# Patient Record
Sex: Female | Born: 1955 | Race: Black or African American | Hispanic: No | Marital: Single | Smoking: Former smoker
Health system: Southern US, Community
[De-identification: ages and names within clinical notes are randomized; demographics above are authoritative.]

## PROBLEM LIST (undated history)

## (undated) DIAGNOSIS — J309 Allergic rhinitis, unspecified: Secondary | ICD-10-CM

## (undated) DIAGNOSIS — D649 Anemia, unspecified: Secondary | ICD-10-CM

## (undated) DIAGNOSIS — K8020 Calculus of gallbladder without cholecystitis without obstruction: Secondary | ICD-10-CM

## (undated) DIAGNOSIS — Z9889 Other specified postprocedural states: Secondary | ICD-10-CM

## (undated) DIAGNOSIS — F32A Depression, unspecified: Secondary | ICD-10-CM

## (undated) DIAGNOSIS — H60509 Unspecified acute noninfective otitis externa, unspecified ear: Secondary | ICD-10-CM

## (undated) DIAGNOSIS — R0789 Other chest pain: Secondary | ICD-10-CM

## (undated) DIAGNOSIS — L84 Corns and callosities: Secondary | ICD-10-CM

## (undated) DIAGNOSIS — F1011 Alcohol abuse, in remission: Secondary | ICD-10-CM

## (undated) DIAGNOSIS — K449 Diaphragmatic hernia without obstruction or gangrene: Secondary | ICD-10-CM

## (undated) DIAGNOSIS — C801 Malignant (primary) neoplasm, unspecified: Secondary | ICD-10-CM

## (undated) DIAGNOSIS — K7010 Alcoholic hepatitis without ascites: Secondary | ICD-10-CM

## (undated) DIAGNOSIS — F329 Major depressive disorder, single episode, unspecified: Secondary | ICD-10-CM

## (undated) DIAGNOSIS — R112 Nausea with vomiting, unspecified: Secondary | ICD-10-CM

## (undated) DIAGNOSIS — H6120 Impacted cerumen, unspecified ear: Secondary | ICD-10-CM

## (undated) DIAGNOSIS — N951 Menopausal and female climacteric states: Secondary | ICD-10-CM

## (undated) DIAGNOSIS — H6690 Otitis media, unspecified, unspecified ear: Secondary | ICD-10-CM

## (undated) DIAGNOSIS — K219 Gastro-esophageal reflux disease without esophagitis: Secondary | ICD-10-CM

## (undated) DIAGNOSIS — E785 Hyperlipidemia, unspecified: Secondary | ICD-10-CM

## (undated) DIAGNOSIS — I10 Essential (primary) hypertension: Secondary | ICD-10-CM

## (undated) DIAGNOSIS — Z9289 Personal history of other medical treatment: Secondary | ICD-10-CM

## (undated) HISTORY — DX: Alcoholic hepatitis without ascites: K70.10

## (undated) HISTORY — PX: ABDOMINAL HYSTERECTOMY: SHX81

## (undated) HISTORY — DX: Menopausal and female climacteric states: N95.1

## (undated) HISTORY — DX: Allergic rhinitis, unspecified: J30.9

## (undated) HISTORY — DX: Depression, unspecified: F32.A

## (undated) HISTORY — DX: Gastro-esophageal reflux disease without esophagitis: K21.9

## (undated) HISTORY — DX: Impacted cerumen, unspecified ear: H61.20

## (undated) HISTORY — DX: Corns and callosities: L84

## (undated) HISTORY — DX: Otitis media, unspecified, unspecified ear: H66.90

## (undated) HISTORY — DX: Other chest pain: R07.89

## (undated) HISTORY — DX: Alcohol abuse, in remission: F10.11

## (undated) HISTORY — DX: Anemia, unspecified: D64.9

## (undated) HISTORY — DX: Essential (primary) hypertension: I10

## (undated) HISTORY — DX: Unspecified acute noninfective otitis externa, unspecified ear: H60.509

## (undated) HISTORY — DX: Major depressive disorder, single episode, unspecified: F32.9

## (undated) HISTORY — DX: Hyperlipidemia, unspecified: E78.5

---
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
## 1998-01-04 ENCOUNTER — Emergency Department (HOSPITAL_COMMUNITY): Admission: EM | Admit: 1998-01-04 | Discharge: 1998-01-04 | Payer: Self-pay | Admitting: Emergency Medicine

## 1998-02-23 ENCOUNTER — Encounter: Admission: RE | Admit: 1998-02-23 | Discharge: 1998-02-23 | Payer: Self-pay | Admitting: Hematology and Oncology

## 1998-03-10 ENCOUNTER — Encounter: Admission: RE | Admit: 1998-03-10 | Discharge: 1998-03-10 | Payer: Self-pay | Admitting: Internal Medicine

## 1998-05-23 ENCOUNTER — Ambulatory Visit (HOSPITAL_COMMUNITY): Admission: RE | Admit: 1998-05-23 | Discharge: 1998-05-23 | Payer: Self-pay

## 1998-11-15 ENCOUNTER — Ambulatory Visit (HOSPITAL_COMMUNITY): Admission: RE | Admit: 1998-11-15 | Discharge: 1998-11-15 | Payer: Self-pay | Admitting: Hematology and Oncology

## 1998-11-15 ENCOUNTER — Encounter: Admission: RE | Admit: 1998-11-15 | Discharge: 1998-11-15 | Payer: Self-pay | Admitting: Hematology and Oncology

## 1998-11-15 ENCOUNTER — Encounter: Payer: Self-pay | Admitting: Hematology and Oncology

## 1998-11-22 ENCOUNTER — Ambulatory Visit (HOSPITAL_COMMUNITY): Admission: RE | Admit: 1998-11-22 | Discharge: 1998-11-22 | Payer: Self-pay | Admitting: *Deleted

## 1998-11-23 ENCOUNTER — Encounter: Admission: RE | Admit: 1998-11-23 | Discharge: 1998-11-23 | Payer: Self-pay | Admitting: Hematology and Oncology

## 1999-01-20 ENCOUNTER — Emergency Department (HOSPITAL_COMMUNITY): Admission: EM | Admit: 1999-01-20 | Discharge: 1999-01-20 | Payer: Self-pay | Admitting: Emergency Medicine

## 1999-01-20 ENCOUNTER — Encounter: Payer: Self-pay | Admitting: Emergency Medicine

## 1999-02-09 ENCOUNTER — Encounter: Admission: RE | Admit: 1999-02-09 | Discharge: 1999-02-09 | Payer: Self-pay | Admitting: Internal Medicine

## 1999-10-12 ENCOUNTER — Encounter: Admission: RE | Admit: 1999-10-12 | Discharge: 1999-10-12 | Payer: Self-pay | Admitting: Internal Medicine

## 2000-04-15 ENCOUNTER — Encounter: Admission: RE | Admit: 2000-04-15 | Discharge: 2000-04-15 | Payer: Self-pay | Admitting: Internal Medicine

## 2001-03-12 ENCOUNTER — Encounter: Admission: RE | Admit: 2001-03-12 | Discharge: 2001-03-12 | Payer: Self-pay

## 2001-04-23 ENCOUNTER — Encounter: Admission: RE | Admit: 2001-04-23 | Discharge: 2001-04-23 | Payer: Self-pay

## 2001-12-09 ENCOUNTER — Encounter: Payer: Self-pay | Admitting: Emergency Medicine

## 2001-12-09 ENCOUNTER — Emergency Department (HOSPITAL_COMMUNITY): Admission: EM | Admit: 2001-12-09 | Discharge: 2001-12-09 | Payer: Self-pay | Admitting: Emergency Medicine

## 2001-12-12 ENCOUNTER — Encounter: Admission: RE | Admit: 2001-12-12 | Discharge: 2001-12-12 | Payer: Self-pay | Admitting: Internal Medicine

## 2001-12-22 ENCOUNTER — Encounter: Admission: RE | Admit: 2001-12-22 | Discharge: 2001-12-22 | Payer: Self-pay | Admitting: Internal Medicine

## 2002-03-30 ENCOUNTER — Encounter: Admission: RE | Admit: 2002-03-30 | Discharge: 2002-03-30 | Payer: Self-pay | Admitting: Internal Medicine

## 2002-04-13 ENCOUNTER — Encounter: Payer: Self-pay | Admitting: Emergency Medicine

## 2002-04-13 ENCOUNTER — Emergency Department (HOSPITAL_COMMUNITY): Admission: EM | Admit: 2002-04-13 | Discharge: 2002-04-13 | Payer: Self-pay | Admitting: Emergency Medicine

## 2002-06-18 ENCOUNTER — Encounter: Admission: RE | Admit: 2002-06-18 | Discharge: 2002-06-18 | Payer: Self-pay | Admitting: Internal Medicine

## 2002-09-10 ENCOUNTER — Encounter: Admission: RE | Admit: 2002-09-10 | Discharge: 2002-09-10 | Payer: Self-pay | Admitting: Infectious Diseases

## 2002-09-11 ENCOUNTER — Ambulatory Visit (HOSPITAL_COMMUNITY): Admission: RE | Admit: 2002-09-11 | Discharge: 2002-09-11 | Payer: Self-pay

## 2003-04-01 ENCOUNTER — Encounter: Admission: RE | Admit: 2003-04-01 | Discharge: 2003-04-01 | Payer: Self-pay | Admitting: Internal Medicine

## 2003-08-11 ENCOUNTER — Encounter: Admission: RE | Admit: 2003-08-11 | Discharge: 2003-08-11 | Payer: Self-pay | Admitting: Internal Medicine

## 2003-08-11 ENCOUNTER — Inpatient Hospital Stay (HOSPITAL_COMMUNITY): Admission: EM | Admit: 2003-08-11 | Discharge: 2003-08-12 | Payer: Self-pay | Admitting: Emergency Medicine

## 2003-08-18 ENCOUNTER — Encounter: Admission: RE | Admit: 2003-08-18 | Discharge: 2003-08-18 | Payer: Self-pay | Admitting: Internal Medicine

## 2003-10-27 ENCOUNTER — Encounter: Admission: RE | Admit: 2003-10-27 | Discharge: 2003-10-27 | Payer: Self-pay | Admitting: Internal Medicine

## 2003-11-02 ENCOUNTER — Encounter: Admission: RE | Admit: 2003-11-02 | Discharge: 2003-11-02 | Payer: Self-pay | Admitting: Internal Medicine

## 2004-04-26 ENCOUNTER — Ambulatory Visit: Payer: Self-pay | Admitting: Internal Medicine

## 2004-06-16 ENCOUNTER — Emergency Department (HOSPITAL_COMMUNITY): Admission: EM | Admit: 2004-06-16 | Discharge: 2004-06-16 | Payer: Self-pay | Admitting: Emergency Medicine

## 2005-04-06 ENCOUNTER — Ambulatory Visit: Payer: Self-pay | Admitting: Internal Medicine

## 2005-04-24 ENCOUNTER — Emergency Department (HOSPITAL_COMMUNITY): Admission: EM | Admit: 2005-04-24 | Discharge: 2005-04-24 | Payer: Self-pay | Admitting: Emergency Medicine

## 2005-09-19 ENCOUNTER — Ambulatory Visit: Payer: Self-pay | Admitting: Internal Medicine

## 2005-10-01 ENCOUNTER — Ambulatory Visit (HOSPITAL_COMMUNITY): Admission: RE | Admit: 2005-10-01 | Discharge: 2005-10-01 | Payer: Self-pay | Admitting: Internal Medicine

## 2005-11-22 ENCOUNTER — Ambulatory Visit (HOSPITAL_COMMUNITY): Admission: RE | Admit: 2005-11-22 | Discharge: 2005-11-22 | Payer: Self-pay | Admitting: Internal Medicine

## 2005-11-22 ENCOUNTER — Ambulatory Visit: Payer: Self-pay | Admitting: Internal Medicine

## 2006-03-08 ENCOUNTER — Encounter (INDEPENDENT_AMBULATORY_CARE_PROVIDER_SITE_OTHER): Payer: Self-pay | Admitting: Internal Medicine

## 2006-03-08 ENCOUNTER — Ambulatory Visit: Payer: Self-pay | Admitting: Internal Medicine

## 2006-03-08 LAB — CONVERTED CEMR LAB
B-12, serum: 625 pg/mL (ref 211–911)
Basophils percent auto: 1 % (ref 0–1)
Glucose, Bld: 99 mg/dL (ref 70–99)
Hemoglobin: 12.3 g/dL (ref 12.0–15.0)
Leukocyte count, blood: 5.1 10*9/L (ref 4.0–10.5)
Lymphocytes Relative: 33 % (ref 12–46)
Lymphs Abs: 1.7 10*3/uL (ref 0.7–3.3)
MCHC: 34.3 g/dL (ref 30.0–36.0)
Monocytes Absolute: 0.4 10*3/uL (ref 0.2–0.7)
Monocytes Relative: 8 % (ref 3–11)
Neutro Abs: 3 10*3/uL (ref 1.7–7.7)
Potassium: 4.3 meq/L (ref 3.5–5.3)
RBC: 3.56 M/uL — ABNORMAL LOW (ref 3.87–5.11)
Sodium: 142 meq/L (ref 135–145)

## 2006-03-11 ENCOUNTER — Ambulatory Visit (HOSPITAL_COMMUNITY): Admission: RE | Admit: 2006-03-11 | Discharge: 2006-03-11 | Payer: Self-pay | Admitting: Internal Medicine

## 2006-03-22 ENCOUNTER — Ambulatory Visit: Payer: Self-pay | Admitting: Internal Medicine

## 2006-04-17 ENCOUNTER — Ambulatory Visit: Payer: Self-pay | Admitting: Internal Medicine

## 2006-04-17 ENCOUNTER — Encounter (INDEPENDENT_AMBULATORY_CARE_PROVIDER_SITE_OTHER): Payer: Self-pay | Admitting: Internal Medicine

## 2006-04-29 ENCOUNTER — Ambulatory Visit: Payer: Self-pay | Admitting: Gastroenterology

## 2006-05-01 ENCOUNTER — Ambulatory Visit: Payer: Self-pay | Admitting: Cardiology

## 2006-05-20 ENCOUNTER — Ambulatory Visit: Payer: Self-pay | Admitting: Internal Medicine

## 2006-05-29 ENCOUNTER — Ambulatory Visit: Payer: Self-pay | Admitting: Gastroenterology

## 2006-06-03 ENCOUNTER — Ambulatory Visit: Payer: Self-pay | Admitting: Gastroenterology

## 2006-06-03 ENCOUNTER — Encounter (INDEPENDENT_AMBULATORY_CARE_PROVIDER_SITE_OTHER): Payer: Self-pay | Admitting: Internal Medicine

## 2006-06-03 ENCOUNTER — Encounter (INDEPENDENT_AMBULATORY_CARE_PROVIDER_SITE_OTHER): Payer: Self-pay | Admitting: Specialist

## 2006-07-09 ENCOUNTER — Telehealth: Payer: Self-pay | Admitting: *Deleted

## 2006-08-28 ENCOUNTER — Telehealth: Payer: Self-pay | Admitting: *Deleted

## 2006-09-02 ENCOUNTER — Telehealth (INDEPENDENT_AMBULATORY_CARE_PROVIDER_SITE_OTHER): Payer: Self-pay | Admitting: *Deleted

## 2006-09-24 ENCOUNTER — Ambulatory Visit: Payer: Self-pay | Admitting: Internal Medicine

## 2006-09-24 ENCOUNTER — Encounter (INDEPENDENT_AMBULATORY_CARE_PROVIDER_SITE_OTHER): Payer: Self-pay | Admitting: Internal Medicine

## 2006-09-24 DIAGNOSIS — N951 Menopausal and female climacteric states: Secondary | ICD-10-CM

## 2006-09-24 DIAGNOSIS — J45909 Unspecified asthma, uncomplicated: Secondary | ICD-10-CM

## 2006-09-24 DIAGNOSIS — F329 Major depressive disorder, single episode, unspecified: Secondary | ICD-10-CM | POA: Insufficient documentation

## 2006-09-24 DIAGNOSIS — Z87891 Personal history of nicotine dependence: Secondary | ICD-10-CM | POA: Insufficient documentation

## 2006-09-24 DIAGNOSIS — F1011 Alcohol abuse, in remission: Secondary | ICD-10-CM | POA: Insufficient documentation

## 2006-09-24 DIAGNOSIS — D539 Nutritional anemia, unspecified: Secondary | ICD-10-CM | POA: Insufficient documentation

## 2006-09-24 DIAGNOSIS — I10 Essential (primary) hypertension: Secondary | ICD-10-CM | POA: Insufficient documentation

## 2006-09-24 DIAGNOSIS — K219 Gastro-esophageal reflux disease without esophagitis: Secondary | ICD-10-CM

## 2006-09-24 DIAGNOSIS — R61 Generalized hyperhidrosis: Secondary | ICD-10-CM | POA: Insufficient documentation

## 2006-09-24 DIAGNOSIS — T7840XA Allergy, unspecified, initial encounter: Secondary | ICD-10-CM

## 2006-09-24 DIAGNOSIS — Z8719 Personal history of other diseases of the digestive system: Secondary | ICD-10-CM

## 2006-09-24 HISTORY — DX: Menopausal and female climacteric states: N95.1

## 2006-09-24 LAB — CONVERTED CEMR LAB
CO2: 26 meq/L (ref 19–32)
Calcium: 9.6 mg/dL (ref 8.4–10.5)
Chloride: 99 meq/L (ref 96–112)
Creatinine, Ser: 0.7 mg/dL (ref 0.40–1.20)
Glucose, Bld: 84 mg/dL (ref 70–99)
HCT: 39.5 % (ref 36.0–46.0)
Hemoglobin: 12.9 g/dL (ref 12.0–15.0)
MCV: 92.3 fL (ref 78.0–100.0)
RBC: 4.28 M/uL (ref 3.87–5.11)
Total Bilirubin: 0.4 mg/dL (ref 0.3–1.2)
WBC: 5.9 10*3/uL (ref 4.0–10.5)

## 2006-09-25 ENCOUNTER — Telehealth (INDEPENDENT_AMBULATORY_CARE_PROVIDER_SITE_OTHER): Payer: Self-pay | Admitting: Pharmacy Technician

## 2006-10-01 ENCOUNTER — Telehealth (INDEPENDENT_AMBULATORY_CARE_PROVIDER_SITE_OTHER): Payer: Self-pay | Admitting: Pharmacy Technician

## 2006-12-26 ENCOUNTER — Ambulatory Visit (HOSPITAL_COMMUNITY): Admission: RE | Admit: 2006-12-26 | Discharge: 2006-12-26 | Payer: Self-pay | Admitting: Obstetrics and Gynecology

## 2007-01-30 ENCOUNTER — Telehealth: Payer: Self-pay | Admitting: *Deleted

## 2007-02-13 ENCOUNTER — Telehealth: Payer: Self-pay | Admitting: *Deleted

## 2007-02-27 ENCOUNTER — Telehealth (INDEPENDENT_AMBULATORY_CARE_PROVIDER_SITE_OTHER): Payer: Self-pay | Admitting: Hospitalist

## 2007-04-07 ENCOUNTER — Ambulatory Visit: Payer: Self-pay | Admitting: Hospitalist

## 2007-04-16 ENCOUNTER — Telehealth (INDEPENDENT_AMBULATORY_CARE_PROVIDER_SITE_OTHER): Payer: Self-pay | Admitting: Internal Medicine

## 2007-04-17 ENCOUNTER — Ambulatory Visit: Payer: Self-pay | Admitting: *Deleted

## 2007-04-21 ENCOUNTER — Telehealth (INDEPENDENT_AMBULATORY_CARE_PROVIDER_SITE_OTHER): Payer: Self-pay | Admitting: Internal Medicine

## 2007-04-29 ENCOUNTER — Telehealth: Payer: Self-pay | Admitting: *Deleted

## 2007-05-28 ENCOUNTER — Telehealth (INDEPENDENT_AMBULATORY_CARE_PROVIDER_SITE_OTHER): Payer: Self-pay | Admitting: Internal Medicine

## 2007-05-29 DIAGNOSIS — L84 Corns and callosities: Secondary | ICD-10-CM

## 2007-05-29 HISTORY — DX: Corns and callosities: L84

## 2007-06-11 ENCOUNTER — Telehealth (INDEPENDENT_AMBULATORY_CARE_PROVIDER_SITE_OTHER): Payer: Self-pay | Admitting: Pharmacy Technician

## 2007-06-12 ENCOUNTER — Ambulatory Visit: Payer: Self-pay | Admitting: Internal Medicine

## 2007-06-23 ENCOUNTER — Telehealth (INDEPENDENT_AMBULATORY_CARE_PROVIDER_SITE_OTHER): Payer: Self-pay | Admitting: Pharmacy Technician

## 2007-08-18 ENCOUNTER — Ambulatory Visit: Payer: Self-pay | Admitting: *Deleted

## 2007-08-18 ENCOUNTER — Encounter (INDEPENDENT_AMBULATORY_CARE_PROVIDER_SITE_OTHER): Payer: Self-pay | Admitting: Internal Medicine

## 2007-08-18 DIAGNOSIS — L84 Corns and callosities: Secondary | ICD-10-CM | POA: Insufficient documentation

## 2007-08-18 LAB — CONVERTED CEMR LAB
CO2: 28 meq/L (ref 19–32)
Glucose, Bld: 93 mg/dL (ref 70–99)
Potassium: 3.9 meq/L (ref 3.5–5.3)
Sodium: 140 meq/L (ref 135–145)

## 2007-08-19 ENCOUNTER — Telehealth (INDEPENDENT_AMBULATORY_CARE_PROVIDER_SITE_OTHER): Payer: Self-pay | Admitting: Internal Medicine

## 2007-09-02 ENCOUNTER — Encounter (INDEPENDENT_AMBULATORY_CARE_PROVIDER_SITE_OTHER): Payer: Self-pay | Admitting: Internal Medicine

## 2007-09-02 ENCOUNTER — Ambulatory Visit: Payer: Self-pay | Admitting: Internal Medicine

## 2007-11-04 ENCOUNTER — Ambulatory Visit: Payer: Self-pay | Admitting: *Deleted

## 2007-11-04 ENCOUNTER — Encounter (INDEPENDENT_AMBULATORY_CARE_PROVIDER_SITE_OTHER): Payer: Self-pay | Admitting: Internal Medicine

## 2007-11-04 DIAGNOSIS — K5900 Constipation, unspecified: Secondary | ICD-10-CM

## 2007-11-05 LAB — CONVERTED CEMR LAB
Alkaline Phosphatase: 55 units/L (ref 39–117)
CO2: 25 meq/L (ref 19–32)
Cholesterol: 272 mg/dL — ABNORMAL HIGH (ref 0–200)
Creatinine, Ser: 0.69 mg/dL (ref 0.40–1.20)
Eosinophils Absolute: 0.2 10*3/uL (ref 0.0–0.7)
Eosinophils Relative: 4 % (ref 0–5)
Glucose, Bld: 70 mg/dL (ref 70–99)
HCT: 41.9 % (ref 36.0–46.0)
HDL: 112 mg/dL (ref >39)
Hemoglobin: 13.5 g/dL (ref 12.0–15.0)
LDL Cholesterol: 148 mg/dL — ABNORMAL HIGH (ref 0–99)
Lymphocytes Relative: 36 % (ref 12–46)
Lymphs Abs: 2 10*3/uL (ref 0.7–4.0)
MCV: 95 fL (ref 78.0–100.0)
Monocytes Absolute: 0.4 10*3/uL (ref 0.1–1.0)
Platelets: 293 10*3/uL (ref 150–400)
RDW: 12.8 % (ref 11.5–15.5)
Sodium: 138 meq/L (ref 135–145)
Total Bilirubin: 1 mg/dL (ref 0.3–1.2)
Total CHOL/HDL Ratio: 2.4
Total Protein: 7.4 g/dL (ref 6.0–8.3)
Triglycerides: 58 mg/dL (ref <150)
VLDL: 12 mg/dL (ref 0–40)
WBC: 5.6 10*3/uL (ref 4.0–10.5)

## 2007-11-25 ENCOUNTER — Telehealth (INDEPENDENT_AMBULATORY_CARE_PROVIDER_SITE_OTHER): Payer: Self-pay | Admitting: Internal Medicine

## 2007-12-11 ENCOUNTER — Telehealth (INDEPENDENT_AMBULATORY_CARE_PROVIDER_SITE_OTHER): Payer: Self-pay | Admitting: Internal Medicine

## 2007-12-29 ENCOUNTER — Ambulatory Visit (HOSPITAL_COMMUNITY): Admission: RE | Admit: 2007-12-29 | Discharge: 2007-12-29 | Payer: Self-pay | Admitting: Internal Medicine

## 2008-01-22 ENCOUNTER — Telehealth (INDEPENDENT_AMBULATORY_CARE_PROVIDER_SITE_OTHER): Payer: Self-pay | Admitting: Internal Medicine

## 2008-01-27 ENCOUNTER — Telehealth (INDEPENDENT_AMBULATORY_CARE_PROVIDER_SITE_OTHER): Payer: Self-pay | Admitting: Pharmacy Technician

## 2008-02-16 ENCOUNTER — Telehealth (INDEPENDENT_AMBULATORY_CARE_PROVIDER_SITE_OTHER): Payer: Self-pay | Admitting: Internal Medicine

## 2008-03-09 ENCOUNTER — Telehealth (INDEPENDENT_AMBULATORY_CARE_PROVIDER_SITE_OTHER): Payer: Self-pay | Admitting: Internal Medicine

## 2008-03-22 ENCOUNTER — Ambulatory Visit: Payer: Self-pay | Admitting: Internal Medicine

## 2008-03-23 ENCOUNTER — Ambulatory Visit: Payer: Self-pay | Admitting: Internal Medicine

## 2008-03-25 DIAGNOSIS — E785 Hyperlipidemia, unspecified: Secondary | ICD-10-CM | POA: Insufficient documentation

## 2008-03-25 LAB — CONVERTED CEMR LAB
LDL Cholesterol: 135 mg/dL — ABNORMAL HIGH (ref 0–99)
VLDL: 15 mg/dL (ref 0–40)

## 2008-04-05 ENCOUNTER — Telehealth (INDEPENDENT_AMBULATORY_CARE_PROVIDER_SITE_OTHER): Payer: Self-pay | Admitting: Internal Medicine

## 2008-04-20 ENCOUNTER — Telehealth: Payer: Self-pay | Admitting: Internal Medicine

## 2008-05-10 ENCOUNTER — Telehealth (INDEPENDENT_AMBULATORY_CARE_PROVIDER_SITE_OTHER): Payer: Self-pay | Admitting: Internal Medicine

## 2008-06-01 ENCOUNTER — Telehealth (INDEPENDENT_AMBULATORY_CARE_PROVIDER_SITE_OTHER): Payer: Self-pay | Admitting: Pharmacy Technician

## 2008-09-22 ENCOUNTER — Telehealth (INDEPENDENT_AMBULATORY_CARE_PROVIDER_SITE_OTHER): Payer: Self-pay | Admitting: Internal Medicine

## 2008-10-01 ENCOUNTER — Ambulatory Visit: Payer: Self-pay | Admitting: Infectious Disease

## 2008-10-01 ENCOUNTER — Encounter (INDEPENDENT_AMBULATORY_CARE_PROVIDER_SITE_OTHER): Payer: Self-pay | Admitting: Internal Medicine

## 2008-10-01 DIAGNOSIS — R1011 Right upper quadrant pain: Secondary | ICD-10-CM

## 2008-10-01 LAB — CONVERTED CEMR LAB
ALT: 13 units/L (ref 0–35)
AST: 21 units/L (ref 0–37)
Basophils Absolute: 0 10*3/uL (ref 0.0–0.1)
Basophils Relative: 0 % (ref 0–1)
CO2: 28 meq/L (ref 19–32)
Calcium: 9.6 mg/dL (ref 8.4–10.5)
Chloride: 103 meq/L (ref 96–112)
Creatinine, Ser: 0.72 mg/dL (ref 0.40–1.20)
Eosinophils Absolute: 0.1 10*3/uL (ref 0.0–0.7)
GFR calc Af Amer: >60 mL/min (ref >60)
MCHC: 33.7 g/dL (ref 30.0–36.0)
MCV: 95 fL (ref 78.0–100.0)
Monocytes Absolute: 0.4 10*3/uL (ref 0.1–1.0)
Monocytes Relative: 7 % (ref 3–12)
Neutrophils Relative %: 60 % (ref 43–77)
Potassium: 3.6 meq/L (ref 3.5–5.3)
RBC: 4.07 M/uL (ref 3.87–5.11)
RDW: 12.9 % (ref 11.5–15.5)
Sodium: 140 meq/L (ref 135–145)
Total Protein: 6.9 g/dL (ref 6.0–8.3)

## 2008-10-06 ENCOUNTER — Ambulatory Visit: Payer: Self-pay | Admitting: Infectious Disease

## 2008-10-06 ENCOUNTER — Encounter: Payer: Self-pay | Admitting: Internal Medicine

## 2008-10-06 LAB — CONVERTED CEMR LAB
BUN: 8 mg/dL (ref 6–23)
CO2: 30 meq/L (ref 19–32)
Calcium: 9.2 mg/dL (ref 8.4–10.5)
Chloride: 101 meq/L (ref 96–112)
Creatinine, Ser: 0.66 mg/dL (ref 0.40–1.20)
GFR calc Af Amer: >60 mL/min (ref >60)
Total Bilirubin: 0.4 mg/dL (ref 0.3–1.2)

## 2008-10-12 ENCOUNTER — Ambulatory Visit (HOSPITAL_COMMUNITY): Admission: RE | Admit: 2008-10-12 | Discharge: 2008-10-12 | Payer: Self-pay | Admitting: Internal Medicine

## 2008-10-13 DIAGNOSIS — K8020 Calculus of gallbladder without cholecystitis without obstruction: Secondary | ICD-10-CM | POA: Insufficient documentation

## 2008-10-14 ENCOUNTER — Telehealth (INDEPENDENT_AMBULATORY_CARE_PROVIDER_SITE_OTHER): Payer: Self-pay | Admitting: *Deleted

## 2008-10-14 ENCOUNTER — Encounter (INDEPENDENT_AMBULATORY_CARE_PROVIDER_SITE_OTHER): Payer: Self-pay | Admitting: *Deleted

## 2008-11-01 ENCOUNTER — Telehealth (INDEPENDENT_AMBULATORY_CARE_PROVIDER_SITE_OTHER): Payer: Self-pay | Admitting: Internal Medicine

## 2008-11-04 ENCOUNTER — Encounter (INDEPENDENT_AMBULATORY_CARE_PROVIDER_SITE_OTHER): Payer: Self-pay | Admitting: Internal Medicine

## 2008-11-04 ENCOUNTER — Ambulatory Visit: Payer: Self-pay | Admitting: Internal Medicine

## 2008-11-04 LAB — CONVERTED CEMR LAB
ALT: 9 units/L (ref 0–35)
AST: 16 units/L (ref 0–37)
Albumin: 4.2 g/dL (ref 3.5–5.2)
Alkaline Phosphatase: 51 units/L (ref 39–117)
Basophils Absolute: 0 10*3/uL (ref 0.0–0.1)
Basophils Relative: 1 % (ref 0–1)
Eosinophils Absolute: 0.1 10*3/uL (ref 0.0–0.7)
Eosinophils Relative: 1 % (ref 0–5)
HCT: 39.4 % (ref 36.0–46.0)
Lymphs Abs: 1.7 10*3/uL (ref 0.7–4.0)
MCHC: 33.4 g/dL (ref 30.0–36.0)
MCV: 95 fL (ref 78.0–100.0)
Platelets: 310 10*3/uL (ref 150–400)
Potassium: 3.9 meq/L (ref 3.5–5.3)
RDW: 12.6 % (ref 11.5–15.5)
Sodium: 141 meq/L (ref 135–145)
Total Protein: 6.9 g/dL (ref 6.0–8.3)

## 2008-12-20 ENCOUNTER — Encounter: Payer: Self-pay | Admitting: Internal Medicine

## 2008-12-28 ENCOUNTER — Telehealth: Payer: Self-pay | Admitting: Internal Medicine

## 2008-12-29 ENCOUNTER — Ambulatory Visit (HOSPITAL_COMMUNITY): Admission: RE | Admit: 2008-12-29 | Discharge: 2008-12-29 | Payer: Self-pay | Admitting: Internal Medicine

## 2009-01-06 HISTORY — PX: CHOLECYSTECTOMY: SHX55

## 2009-02-24 ENCOUNTER — Ambulatory Visit: Payer: Self-pay | Admitting: Internal Medicine

## 2009-02-24 LAB — CONVERTED CEMR LAB
LDL Cholesterol: 116 mg/dL — ABNORMAL HIGH (ref 0–99)
Triglycerides: 78 mg/dL (ref <150)
VLDL: 16 mg/dL (ref 0–40)

## 2009-03-14 ENCOUNTER — Telehealth: Payer: Self-pay | Admitting: Internal Medicine

## 2009-03-15 ENCOUNTER — Telehealth: Payer: Self-pay | Admitting: *Deleted

## 2009-03-22 ENCOUNTER — Telehealth: Payer: Self-pay | Admitting: Internal Medicine

## 2009-04-29 ENCOUNTER — Telehealth: Payer: Self-pay | Admitting: Internal Medicine

## 2009-05-09 ENCOUNTER — Telehealth: Payer: Self-pay | Admitting: Internal Medicine

## 2009-05-19 ENCOUNTER — Telehealth: Payer: Self-pay | Admitting: Internal Medicine

## 2009-05-28 DIAGNOSIS — R0789 Other chest pain: Secondary | ICD-10-CM

## 2009-05-28 HISTORY — DX: Other chest pain: R07.89

## 2009-06-06 ENCOUNTER — Telehealth: Payer: Self-pay | Admitting: Internal Medicine

## 2009-06-22 ENCOUNTER — Telehealth: Payer: Self-pay | Admitting: Internal Medicine

## 2009-07-13 ENCOUNTER — Ambulatory Visit: Payer: Self-pay | Admitting: Internal Medicine

## 2009-07-13 DIAGNOSIS — H60339 Swimmer's ear, unspecified ear: Secondary | ICD-10-CM

## 2009-07-13 LAB — CONVERTED CEMR LAB
ALT: 9 units/L (ref 0–35)
AST: 20 units/L (ref 0–37)
Calcium: 9.5 mg/dL (ref 8.4–10.5)
Chloride: 101 meq/L (ref 96–112)
Creatinine, Ser: 0.78 mg/dL (ref 0.40–1.20)
Total Bilirubin: 0.4 mg/dL (ref 0.3–1.2)
Total CHOL/HDL Ratio: 2.3
VLDL: 19 mg/dL (ref 0–40)

## 2009-07-27 ENCOUNTER — Telehealth: Payer: Self-pay | Admitting: *Deleted

## 2009-07-27 ENCOUNTER — Ambulatory Visit: Payer: Self-pay | Admitting: Infectious Diseases

## 2009-07-27 DIAGNOSIS — R634 Abnormal weight loss: Secondary | ICD-10-CM | POA: Insufficient documentation

## 2009-07-27 DIAGNOSIS — H66009 Acute suppurative otitis media without spontaneous rupture of ear drum, unspecified ear: Secondary | ICD-10-CM | POA: Insufficient documentation

## 2009-07-27 LAB — CONVERTED CEMR LAB
Lymphocytes Relative: 31 % (ref 12–46)
Lymphs Abs: 1.8 10*3/uL (ref 0.7–4.0)
Monocytes Relative: 6 % (ref 3–12)
Neutro Abs: 3.6 10*3/uL (ref 1.7–7.7)
Neutrophils Relative %: 61 % (ref 43–77)
RBC: 3.9 M/uL (ref 3.87–5.11)
TSH: 1.221 microintl units/mL (ref 0.350–4.5)
WBC: 6 10*3/uL (ref 4.0–10.5)

## 2009-08-08 ENCOUNTER — Telehealth (INDEPENDENT_AMBULATORY_CARE_PROVIDER_SITE_OTHER): Payer: Self-pay | Admitting: *Deleted

## 2009-08-10 ENCOUNTER — Telehealth: Payer: Self-pay | Admitting: Internal Medicine

## 2009-08-16 ENCOUNTER — Telehealth: Payer: Self-pay | Admitting: Internal Medicine

## 2009-10-06 ENCOUNTER — Emergency Department (HOSPITAL_COMMUNITY): Admission: EM | Admit: 2009-10-06 | Discharge: 2009-10-06 | Payer: Self-pay | Admitting: Family Medicine

## 2009-11-10 ENCOUNTER — Telehealth: Payer: Self-pay | Admitting: Internal Medicine

## 2009-12-23 ENCOUNTER — Ambulatory Visit: Payer: Self-pay | Admitting: Internal Medicine

## 2009-12-23 DIAGNOSIS — N898 Other specified noninflammatory disorders of vagina: Secondary | ICD-10-CM | POA: Insufficient documentation

## 2009-12-26 LAB — CONVERTED CEMR LAB
Chloride: 102 meq/L (ref 96–112)
HDL: 91 mg/dL (ref >39)
LDL Cholesterol: 96 mg/dL (ref 0–99)
Potassium: 4.1 meq/L (ref 3.5–5.3)
Triglycerides: 69 mg/dL (ref <150)
VLDL: 14 mg/dL (ref 0–40)

## 2010-01-11 ENCOUNTER — Ambulatory Visit (HOSPITAL_COMMUNITY): Admission: RE | Admit: 2010-01-11 | Discharge: 2010-01-11 | Payer: Self-pay | Admitting: Internal Medicine

## 2010-01-23 ENCOUNTER — Telehealth: Payer: Self-pay | Admitting: Internal Medicine

## 2010-03-17 ENCOUNTER — Ambulatory Visit: Payer: Self-pay | Admitting: Internal Medicine

## 2010-06-14 ENCOUNTER — Telehealth: Payer: Self-pay | Admitting: *Deleted

## 2010-06-27 NOTE — Progress Notes (Signed)
Summary: Refill/gh  Phone Note Refill Request Message from:  Fax from Pharmacy on January 23, 2010 10:14 AM  Refills Requested: Medication #1:  ALBUTEROL 90 MCG/ACT AERS Take 2 puffs every 4 hours as needed   Dosage confirmed as above?Dosage Confirmed Pt only has #3 on current prescription MAPP received #4 through pt assisitance.  Needsorders to coverr the inhaler.   Method Requested: Fax to Local Pharmacy Initial call taken by: Angelina Ok RN,  January 23, 2010 10:16 AM  Follow-up for Phone Call        Rx completed in Dr. Tiajuana Amass Follow-up by: Jackson Latino MD,  January 27, 2010 7:05 AM    Prescriptions: ALBUTEROL 90 MCG/ACT AERS (ALBUTEROL) Take 2 puffs every 4 hours as needed  #1 x 6   Entered and Authorized by:   Jackson Latino MD   Signed by:   Jackson Latino MD on 01/27/2010   Method used:   Faxed to ...       [facility] (retail)       [address] [place], [place]  [address]       Ph: [phone]       Fax: [phone]   RxID:   [id number]

## 2010-06-27 NOTE — Assessment & Plan Note (Signed)
Summary: EAR PAIN/YANG/VS   Vital Signs:  Patient profile:   55 year old female Height:      64 inches (162.56 cm) Weight:      97.0 pounds (44.09 kg) BMI:     16.71 Temp:     98.0 degrees F (36.67 degrees C) oral Pulse rate:   79 / minute BP sitting:   119 / 75  (right arm)  Vitals Entered By: Stanton Kidney Ditzler RN (July 13, 2009 8:53 AM) Is Patient Diabetic? No Pain Assessment Patient in pain? yes     Location: both ears Intensity: 10 Type: pain Onset of pain  past month off and on Nutritional Status BMI of < 19 = underweight Nutritional Status Detail appetite good  Have you ever been in a relationship where you felt threatened, hurt or afraid?denies   Does patient need assistance? Functional Status Self care Ambulation Normal Comments Ck ears.   Primary Care Provider:  Jackson Latino MD   History of Present Illness: 55 y/o female with pmh as desribed on the EMR; who comes to the clinic complaining of bilateral ear pain and discomfort for the last 4-6 weeks; patient denies increased nasal congestion, HA's, fever, chills, or any other complaints. Patient denies hearing decreased and also ear drainage; she reports that Right ear is bothering her more than Left. Patient has had simillar presentation in the past (previous hx of otitis mediaand otitis externa) and had also a remote hx of proccedures inside her ears multiple years agoby a ENT Doctor.  She reports to be compliant with her medications and is otherwise feeling well.    Depression History:      The patient denies a depressed mood most of the day and a diminished interest in her usual daily activities.        The patient denies that she feels like life is not worth living, denies that she wishes that she were dead, and denies that she has thought about ending her life.         Preventive Screening-Counseling & Management  Alcohol-Tobacco     Alcohol drinks/day: 0     Smoking Status: quit     Year Quit:  12/23/1995  Caffeine-Diet-Exercise     Does Patient Exercise: no  Problems Prior to Update: 1)  Otitis Externa, Acute, Bilateral  (ICD-380.12) 2)  Gallstones  (ICD-574.20) 3)  Ruq Pain  (ICD-789.01) 4)  Hyperlipidemia, Borderline  (ICD-272.4) 5)  Constipation  (ICD-564.00) 6)  Callus, Toe  (ICD-700) 7)  Depression  (ICD-311) 8)  Tobacco Abuse, Hx of  (ICD-V15.82) 9)  Asthma  (ICD-493.90) 10)  Allergic Rhinitis  (ICD-477.9) 11)  Menopausal Syndrome  (ICD-627.2) 12)  Night Sweats  (ICD-780.8) 13)  Alcoholic Hepatitis, Hx of  (ICD-V12.79) 14)  Gerd  (ICD-530.81) 15)  Hypertension, Essential Nos  (ICD-401.9) 16)  Abuse, Alcohol, 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) 17)  Anemia, Macrocytic  (ICD-281.9)  Current Problems (verified): 1)  Gallstones  (ICD-574.20) 2)  Ruq Pain  (ICD-789.01) 3)  Hyperlipidemia, Borderline  (ICD-272.4) 4)  Constipation  (ICD-564.00) 5)  Callus, Toe  (ICD-700) 6)  Depression  (ICD-311) 7)  Tobacco Abuse, Hx of  (ICD-V15.82) 8)  Asthma  (ICD-493.90) 9)  Allergic Rhinitis  (ICD-477.9) 10)  Menopausal Syndrome  (ICD-627.2) 11)  Night Sweats  (ICD-780.8) 12)  Alcoholic Hepatitis, Hx of  (ICD-V12.79) 13)  Gerd  (ICD-530.81) 14)  Hypertension, Essential Nos  (ICD-401.9) 15)  Abuse, Alcohol, 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 Take 1 Tablet By Mouth Once A Day 2)  Hydrochlorothiazide 25 Mg Tabs (Hydrochlorothiazide) .... Take 1 Tablet By Mouth Once A Day 3)  Albuterol 90 Mcg/act Aers (Albuterol) .... Take 2 Puffs Every 4 Hours As Needed 4)  Flovent Hfa 110 Mcg/act Aero (Fluticasone Propionate  Hfa) .... Take 2 Puffs Two Times A Day 5)  Amitriptyline Hcl 25 Mg Tabs (Amitriptyline Hcl) .... Take 1 Tablet By Mouth At Bedtime 6)  Allegra 180 Mg Tabs (Fexofenadine Hcl) .... Take 1 Tablet By Mouth Once A Day 7)  Estradiol 1 Mg Tabs (Estradiol) .... Take 1 Tablet  By Mouth Once A Day 8)  Multi-Vitamin   Tabs (Multiple Vitamin) .... Take 1 Tablet By Mouth Once A Day 9)  Docusate Sodium 100 Mg  Caps (Docusate Sodium) .... Take 1 Tablet By Mouth At Bedtime To Soften Your Stools 10)  Miralax  Powd (Polyethylene Glycol 3350) .... Take One Heaping Tablespoon in Liquid of Choice Daily. 11)  Vicodin 5-500 Mg Tabs (Hydrocodone-Acetaminophen) .... Take 1 Tablet By Mouth Every 6 Hours As Needed For Pain  Current Medications (verified): 1)  Protonix 40 Mg Tbec (Pantoprazole Sodium) .... Take 1 Tablet By Mouth Once A Day 2)  Hydrochlorothiazide 25 Mg Tabs (Hydrochlorothiazide) .... Take 1 Tablet By Mouth Once A Day 3)  Albuterol 90 Mcg/act Aers (Albuterol) .... Take 2 Puffs Every 4 Hours As Needed 4)  Flovent Hfa 110 Mcg/act Aero (Fluticasone Propionate  Hfa) .... Take 2 Puffs Two Times A Day 5)  Amitriptyline Hcl 25 Mg Tabs (Amitriptyline Hcl) .... Take 1 Tablet By Mouth At Bedtime 6)  Allegra 180 Mg Tabs (Fexofenadine Hcl) .... Take 1 Tablet By Mouth Once A Day 7)  Estradiol 1 Mg Tabs (Estradiol) .... Take 1 Tablet By Mouth Once A Day 8)  Multi-Vitamin   Tabs (Multiple Vitamin) .... Take 1 Tablet By Mouth Once A Day 9)  Docusate Sodium 100 Mg  Caps (Docusate Sodium) .... Take 1 Tablet By Mouth At Bedtime To Soften Your Stools 10)  Miralax  Powd (Polyethylene Glycol 3350) .... Take One Heaping Tablespoon in Liquid of Choice Daily. 11)  Vicodin 5-500 Mg Tabs (Hydrocodone-Acetaminophen) .... Take 1 Tablet By Mouth Every 6 Hours As Needed For Pain  Allergies: 1)  ! Pcn 2)  ! Ibuprofen 3)  ! * Unknown Antibiotic 4)  ! * Grapes  Past History:  Past Medical History: Last updated: 03/22/2008 Seasonal allergies Hypertension Hx of alcoholic hepatitis Alcohol abuse in remission GERD Menopausal syndrome with hot flashes requiring HRT (pt knows of risks and does not want to stop HRT) Macrocytic anemia Asthma Hx of tobacco abuse Depression Constipation  Past  Surgical History: Last updated: 02/24/2009 Hysterectomy Cholecystectomy on 01/06/2009  Family History: Last updated: 03/22/2008 M 43 htn F murdered at 63 6 sibs all healthy 2 children both healthy  Social History: Last updated: 03/22/2008 Former Tob abuse. Former ETOH abuse, in remission since 2007. Occupation: Counselling psychologist Single Drug use-no  Risk Factors: Alcohol Use: 0 (07/13/2009) Exercise: no (07/13/2009)  Risk Factors: Smoking Status: quit (07/13/2009)  Review of Systems       As per HPI.  Physical Exam  General:  alert, well-developed, underweight appearing and well-hydrated.   Ears:  Mild erythema and inflamation of right an left ear canal, no bulging or drainage of her timpanic membranes. R pinna tender and R tragus tender.   Lungs:  normal  respiratory effort, normal breath sounds, no crackles, and no wheezes.   Heart:  normal rate, regular rhythm, no murmur, and no JVD.   Abdomen:  soft, non-tender, normal bowel sounds, no distention, and no masses.   Extremities:  No clubbing, cyanosis, edema, or deformity noted with normal full range of motion of all joints.   Neurologic:  alert & oriented X3, cranial nerves II-XII intact, strength normal in all extremities, sensation intact to light touch, and gait normal.     Impression & Recommendations:  Problem # 1:  OTITIS EXTERNA, ACUTE, BILATERAL (ICD-380.12) Patient with changes inside her ears c/w otits externa. She is at risk of infection due to allergic rhinitis and also ENT proccedure done when she was a child. Will use neomycin-polymyxin drops two times a day and will also continue allegra to controlled her sinusitis.  Her updated medication list for this problem includes:    Neomycin-polymyxin-hc 3.5-10000-1 Soln (Neomycin-polymyxin-hc) .Marland Kitchen... Put two drops inside each ear two times a day  for 2 weeks.  Problem # 2:  HYPERLIPIDEMIA, BORDERLINE (ICD-272.4) Last LDL was 116. Will repeat lipid profile today  and depending results will decide if patient will needor not to start using statins.Patient has been encouraged to follow a low fat diet.  Orders: T-Lipid Profile ([id number])  Problem # 3:  ALLERGIC RHINITIS (ICD-477.9) Will continue allegra daily. Patient advised to pick medication up and to continue good medication compliance. She has been advised to avoid second hand smoking.  Her updated medication list for this problem includes:    Allegra 180 Mg Tabs (Fexofenadine hcl) .Marland Kitchen... Take 1 tablet by mouth once a day  Problem # 4:  ASTHMA (ICD-493.90) Stable and well controlled. Will continue current regimen using as needed albuterol and also flovent two times a day for maintenance.  Her updated medication list for this problem includes:    Albuterol 90 Mcg/act Aers (Albuterol) .Marland Kitchen... Take 2 puffs every 4 hours as needed    Flovent Hfa 110 Mcg/act Aero (Fluticasone propionate  hfa) .Marland Kitchen... Take 2 puffs two times a day  Problem # 5:  HYPERTENSION, ESSENTIAL NOS (ICD-401.9) Well controlledand at goal. Patient encourage to continue doing such a great job taking her medications and to follow a low sodium diet. Will check renal function and electrolytes.  Her updated medication list for this problem includes:    Hydrochlorothiazide 25 Mg Tabs (Hydrochlorothiazide) .Marland Kitchen... Take 1 tablet by mouth once a day  Orders: T-Comprehensive Metabolic Panel ([id number])  Problem # 6:  GERD (ICD-530.81) No increased indigestion or reflux symptoms. Will continue daily use of protonix. Patient received info regarding lifestyle modification changes.  Her updated medication list for this problem includes:    Protonix 40 Mg Tbec (Pantoprazole sodium) .Marland Kitchen... Take 1 tablet by mouth once a day  Problem # 7:  DEPRESSION (ICD-311) Mood stable, patient  feeling energetic and w/o SI or hallucinations. Will continue daily use of amitriptylene at bedtime. Patient endorses no insomnia while using her medication.  Her  updated medication list for this problem includes:    Amitriptyline Hcl 25 Mg Tabs (Amitriptyline hcl) .Marland Kitchen... Take 1 tablet by mouth at bedtime  Complete Medication List: 1)  Protonix 40 Mg Tbec (Pantoprazole sodium) .... Take 1 tablet by mouth once a day 2)  Hydrochlorothiazide 25 Mg Tabs (Hydrochlorothiazide) .... Take 1 tablet by mouth once a day 3)  Albuterol 90 Mcg/act Aers (Albuterol) .... Take 2 puffs every 4 hours as needed 4)  Flovent Hfa 110 Mcg/act  Aero (Fluticasone propionate  hfa) .... Take 2 puffs two times a day 5)  Amitriptyline Hcl 25 Mg Tabs (Amitriptyline hcl) .... Take 1 tablet by mouth at bedtime 6)  Allegra 180 Mg Tabs (Fexofenadine hcl) .... Take 1 tablet by mouth once a day 7)  Estradiol 1 Mg Tabs (Estradiol) .... Take 1 tablet by mouth once a day 8)  Multi-vitamin Tabs (Multiple vitamin) .... Take 1 tablet by mouth once a day 9)  Docusate Sodium 100 Mg Caps (Docusate sodium) .... Take 1 tablet by mouth at bedtime to soften your stools 10)  Miralax Powd (Polyethylene glycol 3350) .... Take one heaping tablespoon in liquid of choice daily. 11)  Vicodin 5-500 Mg Tabs (Hydrocodone-acetaminophen) .... Take 1 tablet by mouth every 6 hours as needed for pain 12)  Neomycin-polymyxin-hc 3.5-10000-1 Soln (Neomycin-polymyxin-hc) .... Put two drops inside each ear two times a day  for 2 weeks.  Other Orders: Tdap => 65yrs IM ([id number])  Patient Instructions: 1)  Please schedule a follow-up appointment in 3 months. 2)  Callclinic and return sooner if your ear pain failed toimproved after 2 weeks using prescribed drops. 3)  Take your medications as prescribed. 4)  Use tylenol for pain (650mg  every 6hours as needed for pain). 5)  Avoid foods high in acid (tomatoes, citrus juices, spicy foods). Avoid eating within two hours of lying down or before exercising. Do not over eat; try smaller more frequent meals. Elevate head of bed twelve inches when  sleeping. Prescriptions: NEOMYCIN-POLYMYXIN-HC 3.5-10000-1 SOLN (NEOMYCIN-POLYMYXIN-HC) Put two drops inside each ear two times a day  for 2 weeks.  #1 x 0   Entered and Authorized by:   Vassie Loll MD   Signed by:   Vassie Loll MD on 07/13/2009   Method used:   Printed then faxed to ...       [id number] (retail)       [address]       [place], [place]  [address]       Ph: [phone]       Fax: [phone]   RxID:   [id number]  Process Orders Check Orders Results:     [facility]: ABN not required for this insurance Tests Sent for requisitioning (July 13, 2009 10:45 AM):     07/13/2009: [facility] -- T-Lipid Profile [phone] (signed)     07/13/2009: [facility] -- T-Comprehensive Metabolic Panel [id number] (signed)    Prevention & Chronic Care Immunizations   Influenza vaccine: Fluvax Non-MCR  (02/24/2009)   Influenza vaccine deferral: Deferred  (07/13/2009)   Influenza vaccine due: 01/26/2010    Tetanus booster: 07/13/2009: Tdap    Pneumococcal vaccine: Not documented   Pneumococcal vaccine deferral: Deferred  (07/13/2009)  Colorectal Screening   Hemoccult: Not documented    Colonoscopy: Pathology:  Adenomatous polyp.        Location:  [facility] (Dr. Christella Hartigan).    (06/14/2006)   Colonoscopy action/deferral: Repeat colonoscopy in 5 years.   (06/14/2006)   Colonoscopy due: 05/2011  Other Screening   Pap smear: Not documented   Pap smear action/deferral: Not indicated S/P hysterectomy  (02/24/2009)    Mammogram: ASSESSMENT: Negative - BI-RADS 1^MM DIGITAL SCREENING  (12/29/2008)   Mammogram due: 12/29/2009   Smoking status: quit  (07/13/2009)  Lipids   Total Cholesterol: 216  (02/24/2009)   Lipid panel action/deferral: Lipid Panel ordered   LDL: 116  (  02/24/2009)   LDL Direct: Not documented   HDL: 84  (02/24/2009)   Triglycerides: 78   (02/24/2009)    SGOT (AST): 16  (11/04/2008)   BMP action: Ordered   SGPT (ALT): 9  (11/04/2008) CMP ordered    Alkaline phosphatase: 51  (11/04/2008)   Total bilirubin: 0.4  (11/04/2008)    Lipid flowsheet reviewed?: Yes   Progress toward LDL goal: At goal  Hypertension   Last Blood Pressure: 119 / 75  (07/13/2009)   Serum creatinine: 0.73  (11/04/2008)   BMP action: Ordered   Serum potassium 3.9  (11/04/2008) CMP ordered     Hypertension flowsheet reviewed?: Yes   Progress toward BP goal: At goal  Self-Management Support :   Personal Goals (by the next clinic visit) :      Personal blood pressure goal: 140/90  (02/24/2009)     Personal LDL goal: 130  (02/24/2009)    Patient will work on the following items until the next clinic visit to reach self-care goals:     Medications and monitoring: take my medicines every day  (07/13/2009)     Eating: eat more vegetables, use fresh or frozen vegetables, eat foods that are low in salt, eat baked foods instead of fried foods, eat fruit for snacks and desserts, limit or avoid alcohol  (07/13/2009)     Activity: take a 30 minute walk every day, park at the far end of the parking lot  (07/13/2009)    Hypertension self-management support: Written self-care plan  (07/13/2009)   Hypertension self-care plan printed.    Lipid self-management support: Written self-care plan  (07/13/2009)   Lipid self-care plan printed.   Nursing Instructions: Give tetanus booster today    Tetanus/Td Vaccine    Vaccine Type: Tdap    Site: left deltoid    Mfr: Gl[employer]oSmithKline    Dose: 0.5 ml    Route: IM    Given by: Stanton Kidney Ditzler RN    Exp. Date: 07/23/2011    Lot #: [id number]    VIS given: 04/15/07 version given July 13, 2009.

## 2010-06-27 NOTE — Progress Notes (Signed)
Summary: change allegra/ hla  Phone Note Refill Request Message from:  Pharmacy on August 16, 2009 9:50 AM  [facility] can no longer get allegra, they can get XYZAL or CLARINEX, please change and add to med list  Initial call taken by: Marin Roberts RN,  August 16, 2009 9:53 AM  Follow-up for Phone Call        Will change to clarinex. Rx faxed to pharmacy, Rx completed in Dr. Tiajuana Amass Follow-up by: Jackson Latino MD,  August 18, 2009 9:18 AM    New/Updated Medications: CLARINEX REDITABS 5 MG TBDP (DESLORATADINE) Take 1 tablet by mouth once a day Prescriptions: CLARINEX REDITABS 5 MG TBDP (DESLORATADINE) Take 1 tablet by mouth once a day  #31 x 6   Entered and Authorized by:   Jackson Latino MD   Signed by:   Jackson Latino MD on 08/18/2009   Method used:   Faxed to ...       [facility] (retail)       [address]       [place], [place]  [address]       Ph: [phone]       Fax: [phone]   RxID:   [id number]

## 2010-06-27 NOTE — Assessment & Plan Note (Signed)
Summary: ear recheck/gg   Vital Signs:  Patient profile:   55 year old female Height:      64 inches (162.56 cm) Weight:      97.9 pounds (44.50 kg) BMI:     16.87 Temp:     97.0 degrees F (36.11 degrees C) oral Pulse rate:   78 / minute BP sitting:   120 / 77  (right arm)  Vitals Entered By: Stanton Kidney Ditzler RN (July 27, 2009 9:45 AM) Is Patient Diabetic? No Pain Assessment Patient in pain? yes     Location: right ear Intensity: 10 Type: pain Onset of pain  since last vs Nutritional Status BMI of < 19 = underweight Nutritional Status Detail appetite good  Have you ever been in a relationship where you felt threatened, hurt or afraid?denies   Does patient need assistance? Functional Status Self care Ambulation Normal Comments No change with pain right ear since last visit.   Primary Care Provider:  Jackson Latino MD   History of Present Illness: Pt is a 55 yo female w/ past med hx below here for evaluation of R ear pain.  Both ears hurt some but the R is markedly worse.  She was seen two weeks ago for similar symptoms and took the drops but it did not help.  Symptoms have not gotton worsen.  She denies HA's, fevers, swollen lymph nodes, visual changes, and sore throat.  Pt notes having tubes in her ears as a child.  No other symptoms.    Also of note, per our record she has lost about 13 lbs unintentionally.  She thinks this may be related to the pain she was having w/ her gallbladder and had a cholecystectomy and hasn't regained any weight.  She denies SOB, chest pain, night sweats and any other symptoms.   Depression History:      The patient denies a depressed mood most of the day and a diminished interest in her usual daily activities.         Preventive Screening-Counseling & Management  Alcohol-Tobacco     Alcohol drinks/day: 0     Smoking Status: quit     Year Quit: 12/23/1995  Caffeine-Diet-Exercise     Does Patient Exercise: no  Current Medications  (verified): 1)  Protonix 40 Mg Tbec (Pantoprazole Sodium) .... Take 1 Tablet By Mouth Once A Day 2)  Hydrochlorothiazide 25 Mg Tabs (Hydrochlorothiazide) .... Take 1 Tablet By Mouth Once A Day 3)  Albuterol 90 Mcg/act Aers (Albuterol) .... Take 2 Puffs Every 4 Hours As Needed 4)  Flovent Hfa 110 Mcg/act Aero (Fluticasone Propionate  Hfa) .... Take 2 Puffs Two Times A Day 5)  Amitriptyline Hcl 25 Mg Tabs (Amitriptyline Hcl) .... Take 1 Tablet By Mouth At Bedtime 6)  Allegra 180 Mg Tabs (Fexofenadine Hcl) .... Take 1 Tablet By Mouth Once A Day 7)  Estradiol 1 Mg Tabs (Estradiol) .... Take 1 Tablet By Mouth Once A Day 8)  Multi-Vitamin   Tabs (Multiple Vitamin) .... Take 1 Tablet By Mouth Once A Day 9)  Docusate Sodium 100 Mg  Caps (Docusate Sodium) .... Take 1 Tablet By Mouth At Bedtime To Soften Your Stools 10)  Miralax  Powd (Polyethylene Glycol 3350) .... Take One Heaping Tablespoon in Liquid of Choice Daily. 11)  Neomycin-Polymyxin-Hc 3.5-10000-1 Soln (Neomycin-Polymyxin-Hc) .... Put Two Drops Inside Each Ear Two Times A Day  For 2 Weeks.  Allergies: 1)  ! Pcn 2)  ! Ibuprofen 3)  ! *  Unknown Antibiotic 4)  ! * Grapes  Past History:  Past Medical History: Last updated: 03/22/2008 Seasonal allergies Hypertension Hx of alcoholic hepatitis Alcohol abuse in remission GERD Menopausal syndrome with hot flashes requiring HRT (pt knows of risks and does not want to stop HRT) Macrocytic anemia Asthma Hx of tobacco abuse Depression Constipation  Past Surgical History: Last updated: 02/24/2009 Hysterectomy Cholecystectomy on 01/06/2009  Social History: Last updated: 03/22/2008 Former Tob abuse. Former ETOH abuse, in remission since 2007. Occupation: Counselling psychologist Single Drug use-no  Risk Factors: Smoking Status: quit (07/27/2009)  Family History: Reviewed history from 03/22/2008 and no changes required. M 78 htn F murdered at 37 6 sibs all healthy 2 children both  healthy  Social History: Reviewed history from 03/22/2008 and no changes required. Former Tob abuse. Former ETOH abuse, in remission since 2007. 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  Physical Exam  General:  alert, oriented, thin, no distress.   Head:  No sinus tenderness.  Eyes:  anicteric, PERRL, EOMI, wearing glasses.  Ears:  R TM w/ erythema w/ slight purulent material seen, Canal mildly erythematous and skin dry.  L TM w/ minimal erythema, clear, and EAC nl.  Nose:  no significant discharge.  Mouth:  MMM, dentition appropriate, no OP erythema/exudates.  Neck:  no LAD.    Impression & Recommendations:  Problem # 1:  OTITIS MEDIA, SUPPURATIVE, ACUTE (ICD-382.00) Symptoms did not improve w/ drops.  No evidence on hx or exam for complicated otitis.  Will start by mouth azithro (PCN allergic) and she should be able to get this as it is [facility]  She will call in if symptoms worsen or do not improve.  Her updated medication list for this problem includes:    Azithromycin 250 Mg Tabs (Azithromycin) .Marland Kitchen... Take two tablets by mouth today and then take one tablet by mouth a day until gone.  Problem # 2:  WEIGHT LOSS (ICD-783.21) Pt is underweight.  She has had an unintentional weight loss over the last year that is likely related to her gallbladder problems.  She is up to date on colonoscopy and mammogram and does not need paps b/c she is s/p hysterectomy.  Will check TSH and CBC looking for other etiologies.  SHe had a recent normal CMET.   Orders: T-TSH ([id number])  Problem # 3:  ABUSE, ALCOHOL, IN REMISSION (ICD-305.03) Continues to be in remission.  Offered praise.  Complete Medication List: 1)  Protonix 40 Mg Tbec (Pantoprazole sodium) .... Take 1 tablet by mouth once a day 2)  Hydrochlorothiazide 25 Mg Tabs (Hydrochlorothiazide) .... Take 1 tablet by mouth once a day 3)  Albuterol 90 Mcg/act Aers  (Albuterol) .... Take 2 puffs every 4 hours as needed 4)  Flovent Hfa 110 Mcg/act Aero (Fluticasone propionate  hfa) .... Take 2 puffs two times a day 5)  Amitriptyline Hcl 25 Mg Tabs (Amitriptyline hcl) .... Take 1 tablet by mouth at bedtime 6)  Allegra 180 Mg Tabs (Fexofenadine hcl) .... Take 1 tablet by mouth once a day 7)  Estradiol 1 Mg Tabs (Estradiol) .... Take 1 tablet by mouth once a day 8)  Multi-vitamin Tabs (Multiple vitamin) .... Take 1 tablet by mouth once a day 9)  Docusate Sodium 100 Mg Caps (Docusate sodium) .... Take 1 tablet by mouth at bedtime to soften your stools 10)  Miralax Powd (Polyethylene glycol 3350) .... Take  one heaping tablespoon in liquid of choice daily. 11)  Azithromycin 250 Mg Tabs (Azithromycin) .... Take two tablets by mouth today and then take one tablet by mouth a day until gone.  Other Orders: T-CBC w/Diff ([id number])  Patient Instructions: 1)  Please make a followup appointment in 3 months for a checkup. 2)  Call sooner if you do not feel better.  Prescriptions: AZITHROMYCIN 250 MG TABS (AZITHROMYCIN) Take two tablets by mouth today and then take one tablet by mouth a day until gone.  #6 x 0   Entered and Authorized by:   Joaquin Courts  MD   Signed by:   Joaquin Courts  MD on 07/27/2009   Method used:   Print then Give to Patient   RxID:   [id number]  Process Orders Check Orders Results:     [facility]: ABN not required for this insurance Tests Sent for requisitioning (July 27, 2009 10:19 AM):     07/27/2009: [facility] -- T-TSH [phone] (signed)     07/27/2009: [facility] -- T-CBC w/Diff [[id number]] (signed)    Prevention & Chronic Care Immunizations   Influenza vaccine: Fluvax Non-MCR  (02/24/2009)   Influenza vaccine deferral: Deferred  (07/13/2009)   Influenza vaccine due: 01/26/2010    Tetanus booster: 07/13/2009: Tdap    Pneumococcal vaccine: Not documented    Pneumococcal vaccine deferral: Deferred  (07/13/2009)  Colorectal Screening   Hemoccult: Not documented    Colonoscopy: Pathology:  Adenomatous polyp.        Location:  [facility] (Dr. Christella Hartigan).    (06/14/2006)   Colonoscopy action/deferral: Repeat colonoscopy in 5 years.   (06/14/2006)   Colonoscopy due: 05/2011  Other Screening   Pap smear: Not documented   Pap smear action/deferral: Not indicated S/P hysterectomy  (02/24/2009)    Mammogram: ASSESSMENT: Negative - BI-RADS 1^MM DIGITAL SCREENING  (12/29/2008)   Mammogram due: 12/29/2009   Smoking status: quit  (07/27/2009)  Lipids   Total Cholesterol: 199  (07/13/2009)   Lipid panel action/deferral: Lipid Panel ordered   LDL: 95  (07/13/2009)   LDL Direct: Not documented   HDL: 85  (07/13/2009)   Triglycerides: 95  (07/13/2009)    SGOT (AST): 20  (07/13/2009)   BMP action: Ordered   SGPT (ALT): 9  (07/13/2009)   Alkaline phosphatase: 48  (07/13/2009)   Total bilirubin: 0.4  (07/13/2009)  Hypertension   Last Blood Pressure: 120 / 77  (07/27/2009)   Serum creatinine: 0.78  (07/13/2009)   BMP action: Ordered   Serum potassium 3.5  (07/13/2009)  Self-Management Support :   Personal Goals (by the next clinic visit) :      Personal blood pressure goal: 140/90  (02/24/2009)     Personal LDL goal: 130  (02/24/2009)    Patient will work on the following items until the next clinic visit to reach self-care goals:     Medications and monitoring: take my medicines every day  (07/27/2009)     Eating: drink diet soda or water instead of juice or soda, eat more vegetables, use fresh or frozen vegetables, eat foods that are low in salt, eat baked foods instead of fried foods, eat fruit for snacks and desserts, limit or avoid alcohol  (07/27/2009)     Activity: take a 30 minute walk every day  (07/27/2009)    Hypertension self-management support: Education handout, Written self-care plan, Resources for patients  handout  (07/27/2009)   Hypertension self-care plan printed.  Hypertension education handout printed    Lipid self-management support: Written self-care plan, Education handout, Resources for patients handout  (07/27/2009)   Lipid self-care plan printed.   Lipid education handout printed      Resource handout printed.

## 2010-06-27 NOTE — Progress Notes (Signed)
Summary: refill/gg  Phone Note Refill Request  on August 08, 2009 2:29 PM  Refills Requested: Medication #1:  AZITHROMYCIN 250 MG TABS Take two tablets by mouth today and then take one tablet by mouth a day until gone.. **pt states she is still having ear pain, rt ear.  It is better but still fells a nagging.  Will you refill the Rx.?  Azithromycin was given on 3/2/  Dr Andrey Campanile not here today   Method Requested: Telephone to Pharmacy Initial call taken by: Merrie Roof RN,  August 08, 2009 2:33 PM  Follow-up for Phone Call        no benefit to refill it.  She should try tylenol for pain Follow-up by: Clydie Braun MD,  August 08, 2009 3:26 PM  Additional Follow-up for Phone Call Additional follow up Details #1::        Pt informed  verbalizes understanding of these instructions.  Additional Follow-up by: Merrie Roof RN,  August 08, 2009 3:33 PM

## 2010-06-27 NOTE — Progress Notes (Signed)
Summary: refill/gg  Phone Note Refill Request  on November 10, 2009 1:54 PM  Refills Requested: Medication #1:  PROTONIX 40 MG TBEC Take 1 tablet by mouth once a day   Dosage confirmed as above?Dosage Confirmed  Medication #2:  FLOVENT HFA 110 MCG/ACT AERO Take 2 puffs two times a day   Dosage confirmed as above?Dosage Confirmed  Method Requested: Fax to Local Pharmacy Initial call taken by: Merrie Roof RN,  November 10, 2009 1:55 PM  Follow-up for Phone Call        Rx completed in Dr. Tiajuana Amass. Follow-up by: Jackson Latino MD,  November 10, 2009 2:07 PM    Prescriptions: FLOVENT HFA 110 MCG/ACT AERO (FLUTICASONE PROPIONATE  HFA) Take 2 puffs two times a day  #3 inhalers x 6   Entered and Authorized by:   Jackson Latino MD   Signed by:   Jackson Latino MD on 11/10/2009   Method used:   Faxed to ...       [facility] (retail)       [address] [place], [place]  [address]       Ph: [phone]       Fax: [phone]   RxID:   [id number] PROTONIX 40 MG TBEC (PANTOPRAZOLE SODIUM) Take 1 tablet by mouth once a day  #60 x 6   Entered and Authorized by:   Jackson Latino MD   Signed by:   Jackson Latino MD on 11/10/2009   Method used:   Faxed to ...       [facility] (retail)       [address] [place], [place]  [address]       Ph: [phone]       Fax: [phone]   RxID:   [id number]

## 2010-06-27 NOTE — Progress Notes (Signed)
Summary: med refill/gp  Phone Note Refill Request Message from:  Fax from Pharmacy on June 06, 2009 3:18 PM  Refills Requested: Medication #1:  ALLEGRA 180 MG TABS Take 1 tablet by mouth once a day   Dosage confirmed as above?Dosage Confirmed   Last Refilled: 08/26/2008  Method Requested: Fax to Local Pharmacy Initial call taken by: Chinita Pester RN,  June 06, 2009 3:18 PM    Prescriptions: ALLEGRA 180 MG TABS (FEXOFENADINE HCL) Take 1 tablet by mouth once a day  #30 x 11   Entered and Authorized by:   Jackson Latino MD   Signed by:   Jackson Latino MD on 06/06/2009   Method used:   Faxed to ...       [facility] (retail)       [address]       [place], [place]  [address]       Ph: [phone]       Fax: [phone]   RxID:   [id number]

## 2010-06-27 NOTE — Assessment & Plan Note (Signed)
Summary: ACUTE-UNABLE TO SEE dR YANG(MEDICATION REFILLS)/CFB   Vital Signs:  Patient profile:   55 year old female Height:      64 inches (162.56 cm) Weight:      95.5 pounds (43.41 kg) BMI:     16.45 Temp:     97.1 degrees F oral Pulse rate:   63 / minute BP sitting:   111 / 77  (right arm)  Vitals Entered By: Chinita Pester RN (December 23, 2009 9:55 AM) CC: Medication refills.Vaginal discharge. Is Patient Diabetic? No Nutritional Status BMI of < 19 = underweight  Have you ever been in a relationship where you felt threatened, hurt or afraid?No   Does patient need assistance? Functional Status Self care Ambulation Normal   Primary Care Provider:  Jackson Latino MD  CC:  Medication refills.Vaginal discharge.Marland Kitchen  History of Present Illness: Patient is 74 vyear old female with PMH as described in EMR who is here today for a follow up appointmet for her chronic medical condiotions and new onset vaginal discharge.  The vaginal discharge started 1 week ago and has been associated with foul smell and itching. The disharge is described as whitish with foul smell. She does not report any new partners, problems in urination or bleeding.   BP is well controlled.   He denies any new sicknesses or hospitalizations, no chest pain episodes, no fevers, no chills, no abdominal or urinary concerns. No recent changes in appetite, weight.   Depression History:      The patient denies a depressed mood most of the day and a diminished interest in her usual daily activities.         Preventive Screening-Counseling & Management  Alcohol-Tobacco     Alcohol drinks/day: 0     Smoking Status: quit     Year Quit: 12/23/1995  Caffeine-Diet-Exercise     Does Patient Exercise: no  Problems Prior to Update: 1)  Otitis Media, Suppurative, Acute  (ICD-382.00) 2)  Weight Loss  (ICD-783.21) 3)  Otitis Externa, Acute, Bilateral  (ICD-380.12) 4)  Gallstones  (ICD-574.20) 5)  Ruq Pain  (ICD-789.01) 6)   Hyperlipidemia, Borderline  (ICD-272.4) 7)  Constipation  (ICD-564.00) 8)  Callus, Toe  (ICD-700) 9)  Depression  (ICD-311) 10)  Tobacco Abuse, Hx of  (ICD-V15.82) 11)  Asthma  (ICD-493.90) 12)  Allergic Rhinitis  (ICD-477.9) 13)  Menopausal Syndrome  (ICD-627.2) 14)  Night Sweats  (ICD-780.8) 15)  Alcoholic Hepatitis, Hx of  (ICD-V12.79) 16)  Gerd  (ICD-530.81) 17)  Hypertension, Essential Nos  (ICD-401.9) 18)  Abuse, Alcohol, 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) 19)  Anemia, Macrocytic  (ICD-281.9)  Medications Prior to Update: 1)  Protonix 40 Mg Tbec (Pantoprazole Sodium) .... Take 1 Tablet By Mouth Once A Day 2)  Hydrochlorothiazide 25 Mg Tabs (Hydrochlorothiazide) .... Take 1 Tablet By Mouth Once A Day 3)  Albuterol 90 Mcg/act Aers (Albuterol) .... Take 2 Puffs Every 4 Hours As Needed 4)  Flovent Hfa 110 Mcg/act Aero (Fluticasone Propionate  Hfa) .... Take 2 Puffs Two Times A Day 5)  Amitriptyline Hcl 25 Mg Tabs (Amitriptyline Hcl) .... Take 1 Tablet By Mouth At Bedtime 6)  Estradiol 1 Mg Tabs (Estradiol) .... Take 1 Tablet By Mouth Once A Day 7)  Multi-Vitamin   Tabs (Multiple Vitamin) .... Take 1 Tablet By Mouth Once A Day 8)  Docusate Sodium 100 Mg  Caps (Docusate Sodium) .... Take 1 Tablet By Mouth At Bedtime To Soften Your Stools 9)  Miralax  Powd (Polyethylene  Glycol 3350) .... Take One Heaping Tablespoon in Liquid of Choice Daily. 10)  Azithromycin 250 Mg Tabs (Azithromycin) .... Take Two Tablets By Mouth Today and Then Take One Tablet By Mouth A Day Until Gone. 11)  Clarinex Reditabs 5 Mg Tbdp (Desloratadine) .... Take 1 Tablet By Mouth Once A Day  Current Medications (verified): 1)  Protonix 40 Mg Tbec (Pantoprazole Sodium) .... Take 1 Tablet By Mouth Once A Day 2)  Hydrochlorothiazide 25 Mg Tabs (Hydrochlorothiazide) .... Take 1 Tablet By Mouth Once A Day 3)  Albuterol 90 Mcg/act Aers (Albuterol) .... Take 2 Puffs Every 4 Hours As Needed 4)  Flovent Hfa 110 Mcg/act Aero  (Fluticasone Propionate  Hfa) .... Take 2 Puffs Two Times A Day 5)  Amitriptyline Hcl 25 Mg Tabs (Amitriptyline Hcl) .... Take 1 Tablet By Mouth At Bedtime 6)  Estradiol 1 Mg Tabs (Estradiol) .... Take 1 Tablet By Mouth Once A Day 7)  Multi-Vitamin   Tabs (Multiple Vitamin) .... Take 1 Tablet By Mouth Once A Day 8)  Docusate Sodium 100 Mg  Caps (Docusate Sodium) .... Take 1 Tablet By Mouth At Bedtime To Soften Your Stools 9)  Miralax  Powd (Polyethylene Glycol 3350) .... Take One Heaping Tablespoon in Liquid of Choice Daily. 10)  Clarinex Reditabs 5 Mg Tbdp (Desloratadine) .... Take 1 Tablet By Mouth Once A Day 11)  Metronidazole 500 Mg Tabs (Metronidazole) .... Take 1 Tablet By Mouth Two Times A Day  Allergies (verified): 1)  ! Pcn 2)  ! Ibuprofen 3)  ! * Unknown Antibiotic 4)  ! * Grapes  Past History:  Past Medical History: Last updated: 03/22/2008 Seasonal allergies Hypertension Hx of alcoholic hepatitis Alcohol abuse in remission GERD Menopausal syndrome with hot flashes requiring HRT (pt knows of risks and does not want to stop HRT) Macrocytic anemia Asthma Hx of tobacco abuse Depression Constipation  Past Surgical History: Last updated: 02/24/2009 Hysterectomy Cholecystectomy on 01/06/2009  Family History: Last updated: 03/22/2008 M 86 htn F murdered at 70 6 sibs all healthy 2 children both healthy  Social History: Last updated: 03/22/2008 Former Tob abuse. Former ETOH abuse, in remission since 2007. Occupation: Counselling psychologist Single Drug use-no  Risk Factors: Alcohol Use: 0 (12/23/2009) Exercise: no (12/23/2009)  Risk Factors: Smoking Status: quit (12/23/2009)  Family History: Reviewed history from 03/22/2008 and no changes required. M 71 htn F murdered at 10 6 sibs all healthy 2 children both healthy  Social History: Reviewed history from 03/22/2008 and no changes required. Former Tob abuse. Former ETOH abuse, in remission since  2007. Occupation: Therapist, music Drug use-no  Review of Systems      See HPI  Physical Exam  Genitalia:  normal introitus, no external lesions, normal uterus size and position, no adnexal masses or tenderness, and white , thin vaginal discharge.   Additional Exam:  Gen: AOx3, in no acute distress CVS: regular rhythmic rate, NO M/R/G, S1 S2 normal Abdo: soft,ND, BS+x4, Non tender and No hepatosplenomegaly pelvic exam: see GU section   Impression & Recommendations:  Problem # 1:  VAGINAL DISCHARGE (ICD-623.5) Assessment New The discharge looks off white, thin and homogeneou whihc is characteristic for bacterial baginosis,  i will treat her with Metronidazole for 7 days and send the specimen for wet prep for confirmation. Since no new partners or sexual activity noted and physiacl exam is c/w bacterial vaginosis with characteristic discharge, i do not suspect sexually transmitted disease in her like GC/Chlymidia. Will see her back after  a week if the discharge does not improve.  I provided the patient with all the necessary information from uptodate resources for patients.  Orders: T-Wet Prep by Molecular Probe [phone])  Problem # 2:  HYPERLIPIDEMIA, BORDERLINE (ICD-272.4) Assessment: Unchanged Will check cholesterol. Orders: T-Lipid Profile [id number])  Labs Reviewed: SGOT: 20 (07/13/2009)   SGPT: 9 (07/13/2009)   HDL:85 (07/13/2009), 84 (02/24/2009)  LDL:95 (07/13/2009), 116 (30/86/5784)  Chol:199 (07/13/2009), 216 (02/24/2009)  Trig:95 (07/13/2009), 78 (02/24/2009)  Problem # 3:  HYPERTENSION, ESSENTIAL NOS (ICD-401.9) Assessment: Unchanged At goal. Will check Bmet today.  Her updated medication list for this problem includes:    Hydrochlorothiazide 25 Mg Tabs (Hydrochlorothiazide) .Marland Kitchen... Take 1 tablet by mouth once a day  Orders: T-Basic Metabolic Panel [id number])  BP today: 111/77 Prior BP: 120/77 (07/27/2009)  Labs Reviewed: K+: 3.5  (07/13/2009) Creat: : 0.78 (07/13/2009)   Chol: 199 (07/13/2009)   HDL: 85 (07/13/2009)   LDL: 95 (07/13/2009)   TG: 95 (07/13/2009)  Problem # 4:  Preventive Health Care (ICD-V70.0) Assessment: Comment Only She is uptodate as of today with all her immunizations. Will have her see her PCP manage preventative care at the next visit.  Complete Medication List: 1)  Protonix 40 Mg Tbec (Pantoprazole sodium) .... Take 1 tablet by mouth once a day 2)  Hydrochlorothiazide 25 Mg Tabs (Hydrochlorothiazide) .... Take 1 tablet by mouth once a day 3)  Albuterol 90 Mcg/act Aers (Albuterol) .... Take 2 puffs every 4 hours as needed 4)  Flovent Hfa 110 Mcg/act Aero (Fluticasone propionate  hfa) .... Take 2 puffs two times a day 5)  Amitriptyline Hcl 25 Mg Tabs (Amitriptyline hcl) .... Take 1 tablet by mouth at bedtime 6)  Estradiol 1 Mg Tabs (Estradiol) .... Take 1 tablet by mouth once a day 7)  Multi-vitamin Tabs (Multiple vitamin) .... Take 1 tablet by mouth once a day 8)  Docusate Sodium 100 Mg Caps (Docusate sodium) .... Take 1 tablet by mouth at bedtime to soften your stools 9)  Miralax Powd (Polyethylene glycol 3350) .... Take one heaping tablespoon in liquid of choice daily. 10)  Clarinex Reditabs 5 Mg Tbdp (Desloratadine) .... Take 1 tablet by mouth once a day 11)  Metronidazole 500 Mg Tabs (Metronidazole) .... Take 1 tablet by mouth two times a day  Patient Instructions: 1)  Please schedule a follow-up appointment in 6 months. 2)  Take your antibiotic as prescribed until ALL of it is gone, but stop if you develop a rash or swelling and contact our office as soon as possible. 3)  Please schedule an appointmnet if your condition deteriorates. Prescriptions: MIRALAX  POWD (POLYETHYLENE GLYCOL 3350) Take one heaping tablespoon in liquid of choice daily.  #240 grams x 11   Entered and Authorized by:   Lars Mage MD   Signed by:   Lars Mage MD on 12/23/2009   Method used:   Print then Give to  Patient   RxID:   [id number] DOCUSATE SODIUM 100 MG  CAPS (DOCUSATE SODIUM) Take 1 tablet by mouth at bedtime to soften your stools  #30 x 11   Entered and Authorized by:   Lars Mage MD   Signed by:   Lars Mage MD on 12/23/2009   Method used:   Print then Give to Patient   RxID:   [id number] ESTRADIOL 1 MG TABS (ESTRADIOL) Take 1 tablet by mouth once a day  #30 x 11   Entered and Authorized by:   Lars Mage MD  Signed by:   Lars Mage MD on 12/23/2009   Method used:   Print then Give to Patient   RxID:   [id number] AMITRIPTYLINE HCL 25 MG TABS (AMITRIPTYLINE HCL) Take 1 tablet by mouth at bedtime  #30 x 11   Entered and Authorized by:   Lars Mage MD   Signed by:   Lars Mage MD on 12/23/2009   Method used:   Print then Give to Patient   RxID:   [id number] ALBUTEROL 90 MCG/ACT AERS (ALBUTEROL) Take 2 puffs every 4 hours as needed  #1 x 6   Entered and Authorized by:   Lars Mage MD   Signed by:   Lars Mage MD on 12/23/2009   Method used:   Print then Give to Patient   RxID:   [id number] HYDROCHLOROTHIAZIDE 25 MG TABS (HYDROCHLOROTHIAZIDE) Take 1 tablet by mouth once a day  #30 x 6   Entered and Authorized by:   Lars Mage MD   Signed by:   Lars Mage MD on 12/23/2009   Method used:   Print then Give to Patient   RxID:   [id number] METRONIDAZOLE 500 MG TABS (METRONIDAZOLE) Take 1 tablet by mouth two times a day  #14 x 0   Entered and Authorized by:   Lars Mage MD   Signed by:   Lars Mage MD on 12/23/2009   Method used:   Print then Give to Patient   RxID:   [id number]   Prevention & Chronic Care Immunizations   Influenza vaccine: Fluvax Non-MCR  (02/24/2009)   Influenza vaccine deferral: Deferred  (12/23/2009)   Influenza vaccine due: 01/26/2010    Tetanus booster: 07/13/2009: Tdap    Pneumococcal vaccine: Not documented   Pneumococcal vaccine deferral: Deferred  (07/13/2009)  Colorectal Screening   Hemoccult: Not  documented    Colonoscopy: Pathology:  Adenomatous polyp.        Location:  [facility] (Dr. Christella Hartigan).    (06/14/2006)   Colonoscopy action/deferral: Repeat colonoscopy in 5 years.   (06/14/2006)   Colonoscopy due: 05/2011  Other Screening   Pap smear: Not documented   Pap smear action/deferral: Not indicated S/P hysterectomy  (02/24/2009)    Mammogram: ASSESSMENT: Negative - BI-RADS 1^MM DIGITAL SCREENING  (12/29/2008)   Mammogram due: 12/29/2009   Smoking status: quit  (12/23/2009)  Lipids   Total Cholesterol: 199  (07/13/2009)   Lipid panel action/deferral: Lipid Panel ordered   LDL: 95  (07/13/2009)   LDL Direct: Not documented   HDL: 85  (07/13/2009)   Triglycerides: 95  (07/13/2009)    SGOT (AST): 20  (07/13/2009)   BMP action: Ordered   SGPT (ALT): 9  (07/13/2009)   Alkaline phosphatase: 48  (07/13/2009)   Total bilirubin: 0.4  (07/13/2009)    Lipid flowsheet reviewed?: Yes   Progress toward LDL goal: At goal  Hypertension   Last Blood Pressure: 111 / 77  (12/23/2009)   Serum creatinine: 0.78  (07/13/2009)   BMP action: Ordered   Serum potassium 3.5  (07/13/2009)    Hypertension flowsheet reviewed?: Yes   Progress toward BP goal: At goal  Self-Management Support :   Personal Goals (by the next clinic visit) :      Personal blood pressure goal: 140/90  (02/24/2009)     Personal LDL goal: 130  (02/24/2009)    Patient will work on the following items until the next clinic visit to reach self-care goals:     Medications and monitoring: take my  medicines every day, bring all of my medications to every visit  (12/23/2009)     Eating: use fresh or frozen vegetables, eat foods that are low in salt, eat baked foods instead of fried foods  (12/23/2009)     Activity: take a 30 minute walk every day  (07/27/2009)    Hypertension self-management support: Written self-care plan  (12/23/2009)   Hypertension self-care plan printed.    Lipid  self-management support: Written self-care plan  (12/23/2009)   Lipid self-care plan printed.  Process Orders Check Orders Results:     [facility]: ABN not required for this insurance Order queued for requisitioning for [facility]: December 23, 2009 10:07 AM  Tests Sent for requisitioning (December 23, 2009 10:07 AM):     12/23/2009: [facility] -- T-Wet Prep by Molecular Probe [phone] (signed)     12/23/2009: [facility] -- T-Basic Metabolic Panel [id number] (signed)     12/23/2009: [facility] -- T-Lipid Profile [id number] (signed)     Process Orders Check Orders Results:     [facility]: ABN not required for this insurance Order queued for requisitioning for [facility]: December 23, 2009 10:07 AM  Tests Sent for requisitioning (December 23, 2009 10:07 AM):     12/23/2009: [facility] -- T-Wet Prep by Molecular Probe [phone] (signed)     12/23/2009: [facility] -- T-Basic Metabolic Panel [phone] (signed)     12/23/2009: [facility] -- T-Lipid Profile [phone] (signed)

## 2010-06-27 NOTE — Progress Notes (Signed)
Summary: phone/gg  Phone Note Call from Patient   Caller: Patient Summary of Call: Pt called with c/o bil ear pain.  She was seen in clinic on 2/16 and dx with otitis externa, acute, bilateral.  she was treated with neomycin-polymixin ear drops for 2 weeks.  She has finished the meds and still having pain. Will see this AM   Initial call taken by: Merrie Roof RN,  July 27, 2009 9:05 AM

## 2010-06-27 NOTE — Progress Notes (Signed)
Summary: refill/gg  Phone Note Refill Request  on June 22, 2009 3:35 PM  Refills Requested: Medication #1:  VICODIN 5-500 MG TABS Take 1 tablet by mouth every 6 hours as needed for pain.   Dosage confirmed as above?Dosage Confirmed   Last Refilled: 03/23/2009  Method Requested: Fax to Local Pharmacy Initial call taken by: Merrie Roof RN,  June 22, 2009 3:35 PM  Follow-up for Phone Call        Rx completed in Dr. Tiajuana Amass Follow-up by: Jackson Latino MD,  June 22, 2009 7:39 PM    Prescriptions: VICODIN 5-500 MG TABS (HYDROCODONE-ACETAMINOPHEN) Take 1 tablet by mouth every 6 hours as needed for pain  #50 x 0   Entered and Authorized by:   Jackson Latino MD   Signed by:   Jackson Latino MD on 06/22/2009   Method used:   Telephoned to ...       [facility] [address] [phone]* (retail)       [address]       [place], [place]  [address]       Ph: [phone]       Fax: [phone]   RxID:   [id number]   Appended Document: refill/gg Rx called in

## 2010-06-27 NOTE — Progress Notes (Signed)
Summary: refill/gg  Phone Note Refill Request  on August 10, 2009 11:39 AM  Refills Requested: Medication #1:  ALBUTEROL 90 MCG/ACT AERS Take 2 puffs every 4 hours as needed   Last Refilled: 05/19/2009  Method Requested: Fax to Local Pharmacy Initial call taken by: Merrie Roof RN,  August 10, 2009 11:39 AM  Follow-up for Phone Call       Follow-up by: Clydie Braun MD,  August 10, 2009 12:06 PM    Prescriptions: ALBUTEROL 90 MCG/ACT AERS (ALBUTEROL) Take 2 puffs every 4 hours as needed  #1 x 6   Entered and Authorized by:   Clydie Braun MD   Signed by:   Clydie Braun MD on 08/10/2009   Method used:   Faxed to ...       [facility] (retail)       [address]       [place], [place]  [address]       Ph: [phone]       Fax: [phone]   RxID:   [id number]

## 2010-06-27 NOTE — Assessment & Plan Note (Signed)
Summary: FLU/SB.  Nurse Visit   Allergies: 1)  ! Pcn 2)  ! Ibuprofen 3)  ! * Unknown Antibiotic 4)  ! * Grapes  Immunizations Administered:  Influenza Vaccine # 1:    Vaccine Type: Fluvax Non-MCR    Site: right deltoid    Mfr: Sanofi Pasteur    Dose: 0.5 ml    Route: IM    Given by: Chinita Pester RN    Exp. Date: 11/25/2010    Lot #: [id number]    VIS given: 12/20/09 version given March 17, 2010.  Flu Vaccine Consent Questions:    Do you have a history of severe allergic reactions to this vaccine? no    Any prior history of allergic reactions to egg and/or gelatin? no    Do you have a sensitivity to the preservative Thimersol? no    Do you have a past history of Guillan-Barre Syndrome? no    Do you currently have an acute febrile illness? no    Have you ever had a severe reaction to latex? yes    Vaccine information given and explained to patient? yes    Are you currently pregnant? no  Orders Added: 1)  Influenza Vaccine NON MCR [00028]

## 2010-06-28 DIAGNOSIS — H60509 Unspecified acute noninfective otitis externa, unspecified ear: Secondary | ICD-10-CM

## 2010-06-28 DIAGNOSIS — H6690 Otitis media, unspecified, unspecified ear: Secondary | ICD-10-CM

## 2010-06-28 HISTORY — DX: Otitis media, unspecified, unspecified ear: H66.90

## 2010-06-28 HISTORY — DX: Unspecified acute noninfective otitis externa, unspecified ear: H60.509

## 2010-06-29 NOTE — Progress Notes (Addendum)
Summary: Irritated tngue  Phone Note Call from Patient   Caller: Patient Call For: Jackson Latino MD Summary of Call: Call from pt said that her tongue is burning.  Tongue is red.  Pt is eating a lot of fruit and spicy foods. Pt was advised to limit or do Pt to do for at least 24 hours and to callback if symptoms persist. Initial call taken by: Angelina Ok RN,  June 14, 2010 4:12 PM  Follow-up for Phone Call        Pt called back today to say her tongue is feeling better, less burning,  redness is getting better.   She is feeling better.  Pt informed to let us know if symptoms return.  She seems to be doing better with avoiding spicy foods.  Patient/caller verbalizes understanding of these instructions.  Follow-up by: Merrie Roof RN,  June 15, 2010 3:23 PM  Additional Follow-up for Phone Call Additional follow up Details #1::        Agree with plan; if her symptoms persist, she should be seen. Additional Follow-up by: Margarito Liner MD,  June 15, 2010 3:35 PM

## 2010-07-10 ENCOUNTER — Telehealth: Payer: Self-pay | Admitting: *Deleted

## 2010-07-10 NOTE — Telephone Encounter (Addendum)
Pt reports via ph that problem w/ her tongue has returned, pt denies that she has changed her diet or has had any chemical contact, she is referred to urg care or ED, she is agreeable

## 2010-07-12 ENCOUNTER — Inpatient Hospital Stay (INDEPENDENT_AMBULATORY_CARE_PROVIDER_SITE_OTHER)
Admission: RE | Admit: 2010-07-12 | Discharge: 2010-07-12 | Disposition: A | Discharge status: Home / Self Care | Payer: Self-pay | Source: Ambulatory Visit | Attending: Emergency Medicine | Admitting: Emergency Medicine

## 2010-07-12 DIAGNOSIS — R6889 Other general symptoms and signs: Secondary | ICD-10-CM

## 2010-07-16 ENCOUNTER — Emergency Department (HOSPITAL_COMMUNITY)
Admission: EM | Admit: 2010-07-16 | Discharge: 2010-07-16 | Disposition: A | Discharge status: Home / Self Care | Payer: Self-pay | Attending: Emergency Medicine | Admitting: Emergency Medicine

## 2010-07-16 ENCOUNTER — Emergency Department (HOSPITAL_COMMUNITY): Payer: Self-pay

## 2010-07-16 DIAGNOSIS — I10 Essential (primary) hypertension: Secondary | ICD-10-CM | POA: Insufficient documentation

## 2010-07-16 DIAGNOSIS — J45909 Unspecified asthma, uncomplicated: Secondary | ICD-10-CM | POA: Insufficient documentation

## 2010-07-16 DIAGNOSIS — R071 Chest pain on breathing: Secondary | ICD-10-CM | POA: Insufficient documentation

## 2010-07-16 DIAGNOSIS — K219 Gastro-esophageal reflux disease without esophagitis: Secondary | ICD-10-CM | POA: Insufficient documentation

## 2010-07-16 DIAGNOSIS — Z7982 Long term (current) use of aspirin: Secondary | ICD-10-CM | POA: Insufficient documentation

## 2010-07-16 DIAGNOSIS — Z79899 Other long term (current) drug therapy: Secondary | ICD-10-CM | POA: Insufficient documentation

## 2010-07-16 LAB — POCT CARDIAC MARKERS
CKMB, poc: <1 ng/mL — ABNORMAL LOW (ref 1.0–8.0)
Myoglobin, poc: 31.5 ng/mL (ref 12–200)
Myoglobin, poc: 31.1 ng/mL (ref 12–200)
Troponin i, poc: <0.05 ng/mL (ref 0.00–0.09)
Troponin i, poc: <0.05 ng/mL (ref 0.00–0.09)

## 2010-07-16 LAB — CBC
Hemoglobin: 12.5 g/dL (ref 12.0–15.0)
MCH: 31.4 pg (ref 26.0–34.0)
RBC: 3.98 MIL/uL (ref 3.87–5.11)
WBC: 3.1 10*3/uL — ABNORMAL LOW (ref 4.0–10.5)

## 2010-07-16 LAB — DIFFERENTIAL
Basophils Relative: 1 % (ref 0–1)
Lymphs Abs: 1.1 10*3/uL (ref 0.7–4.0)
Monocytes Relative: 10 % (ref 3–12)
Neutro Abs: 1.6 10*3/uL — ABNORMAL LOW (ref 1.7–7.7)
Neutrophils Relative %: 51 % (ref 43–77)

## 2010-07-16 LAB — BASIC METABOLIC PANEL
BUN: 5 mg/dL — ABNORMAL LOW (ref 6–23)
CO2: 31 mEq/L (ref 19–32)
Calcium: 9.2 mg/dL (ref 8.4–10.5)
Chloride: 98 mEq/L (ref 96–112)
GFR calc Af Amer: >60 mL/min (ref >60)
GFR calc non Af Amer: >60 mL/min (ref >60)
Potassium: 3.6 mEq/L (ref 3.5–5.1)

## 2010-07-25 ENCOUNTER — Other Ambulatory Visit: Payer: Self-pay | Admitting: *Deleted

## 2010-07-26 MED ORDER — ALBUTEROL SULFATE HFA 108 (90 BASE) MCG/ACT IN AERS: 2.0000 | INHALATION_SPRAY | RESPIRATORY_TRACT | Status: DC | PRN

## 2010-08-21 ENCOUNTER — Encounter: Payer: Self-pay | Admitting: Internal Medicine

## 2010-08-21 ENCOUNTER — Ambulatory Visit (INDEPENDENT_AMBULATORY_CARE_PROVIDER_SITE_OTHER): Payer: Self-pay | Admitting: Internal Medicine

## 2010-08-21 ENCOUNTER — Ambulatory Visit: Payer: Self-pay | Admitting: Internal Medicine

## 2010-08-21 VITALS — BP 115/80 | HR 91 | Temp 97.4°F | Ht 64.0 in | Wt 104.5 lb

## 2010-08-21 DIAGNOSIS — F329 Major depressive disorder, single episode, unspecified: Secondary | ICD-10-CM

## 2010-08-21 DIAGNOSIS — I10 Essential (primary) hypertension: Secondary | ICD-10-CM

## 2010-08-21 DIAGNOSIS — R071 Chest pain on breathing: Secondary | ICD-10-CM

## 2010-08-21 DIAGNOSIS — R0789 Other chest pain: Secondary | ICD-10-CM | POA: Insufficient documentation

## 2010-08-21 MED ORDER — ACETAMINOPHEN 500 MG PO TABS: 500.0000 mg | ORAL_TABLET | ORAL | Status: DC | PRN

## 2010-08-21 MED ORDER — HYDROCHLOROTHIAZIDE 25 MG PO TABS: 25.0000 mg | ORAL_TABLET | Freq: Every day | ORAL | Status: DC

## 2010-08-21 MED ORDER — AMITRIPTYLINE HCL 25 MG PO TABS: 25.0000 mg | ORAL_TABLET | Freq: Every day | ORAL | Status: DC

## 2010-08-21 MED ORDER — PANTOPRAZOLE SODIUM 40 MG PO TBEC: 40.0000 mg | DELAYED_RELEASE_TABLET | Freq: Every day | ORAL | Status: DC

## 2010-08-21 NOTE — Assessment & Plan Note (Signed)
Her symptom is stable and no SI/HI. Will continue amitriptyline and refill for her.

## 2010-08-21 NOTE — Patient Instructions (Signed)
Please take all your medications as instructed in your instructions.   Please use heating pad on the the affected area for 30 minutes and three time a day.

## 2010-08-21 NOTE — Assessment & Plan Note (Addendum)
This is likely due to musculoskeletal cause or costochondritis, ACS unlikely. Will continue ibuprofen, add tylenol for pain control. Advised her to use heating pad.

## 2010-08-21 NOTE — Progress Notes (Signed)
  Subjective:    Patient ID: Kimberly [name], female    DOB: Dec 11, 1955, 55 y.o.   MRN: [id number]  HPI Patient is a 55 years old AAFwith past medical history  as outlined here who comes to the Clinic for right chest wall pain and meds refill. It started about 4 weeks ago ago and went to ED, had negative CXR, EKG and POC marker, the pain is about 5/20, no radiation, exercise made it worse. Has no fever, chill, chest pain, shortness of breath, hemoptysis, abdominal pain, nausea, vomiting, diarrhea, melena, dysuria, significant weight change. She tried heating paid and ibuprofen several days ago, found the pain is better, but still not resolved, now 57-3/10. Denies recent smoking, alcohol or drug abuse. Has been taking all his medications as instructed.     Review of Systems Per HPI.  Current Outpatient Medications Current Outpatient Prescriptions  Medication Sig Dispense Refill  . albuterol (PROVENTIL,VENTOLIN) 90 MCG/ACT inhaler Inhale 2 puffs into the lungs every 4 (four) hours as needed.        Marland Kitchen amitriptyline (ELAVIL) 25 MG tablet Take 25 mg by mouth at bedtime.        Marland Kitchen desloratadine (CLARINEX) 5 MG tablet Take 5 mg by mouth daily.        Marland Kitchen docusate sodium (COLACE) 100 MG capsule Take 100 mg by mouth at bedtime.        Marland Kitchen estradiol (ESTRACE) 1 MG tablet Take 1 mg by mouth daily.        . fluticasone (FLOVENT HFA) 110 MCG/ACT inhaler Inhale 2 puffs into the lungs 2 (two) times daily.        . hydrochlorothiazide 25 MG tablet Take 25 mg by mouth daily.        . Multiple Vitamin (MULTIVITAMIN) capsule Take 1 capsule by mouth daily.        . pantoprazole (PROTONIX) 40 MG tablet Take 40 mg by mouth daily.        . polyethylene glycol (MIRALAX / GLYCOLAX) packet Take 17 g by mouth daily.        Marland Kitchen albuterol (PROVENTIL HFA;VENTOLIN HFA) 108 (90 BASE) MCG/ACT inhaler Inhale 2 puffs into the lungs every 4 (four) hours as needed for wheezing.  3 g  4    Allergies Ibuprofen and Penicillins  Past  Medical History  Diagnosis Date  . Gallstone   . Hyperlipidemia   . Hypertension   . Depression   . Anemia   . Asthma   . Menopausal syndrome   . GERD (gastroesophageal reflux disease)   . Alcoholic hepatitis   . Tobacco abuse   . Allergy     rhinitis    No past surgical history on file.     Objective:   Physical Exam General: Vital signs reviewed and noted. Well-developed,well-nourished,in no acute distress; alert,appropriate and cooperative throughout examination. Head: normocephalic, atraumatic. Neck: No deformities, masses, or tenderness noted. Lungs: Normal respiratory effort. Clear to auscultation BL without crackles or wheezes.  Heart: RRR. S1 and S2 normal without gallop, murmur, or rubs. Right chest wall just medial side of her rigth breast, a local mild tenderness. No erythema or swelling.  Abdomen: BS normoactive. Soft, Nondistended, non-tender.  No masses or organomegaly. Extremities: No pretibial edema.      Assessment & Plan:

## 2010-08-21 NOTE — Assessment & Plan Note (Signed)
Lab Results  Component Value Date   NA 138 07/16/2010   K 3.6 07/16/2010   CL 98 07/16/2010   CO2 31 07/16/2010   BUN 5* 07/16/2010   CREATININE 0.70 07/16/2010    BP Readings from Last 3 Encounters:  08/21/10 115/80  12/23/09 111/77  07/27/09 120/77   BP well controlled and at goal. Will continue current med of HCTZ.

## 2010-10-13 ENCOUNTER — Encounter: Payer: Self-pay | Admitting: Internal Medicine

## 2010-10-13 NOTE — Assessment & Plan Note (Signed)
[facility]                         GASTROENTEROLOGY OFFICE NOTE   [name], [name]                      MRN:          [id number]  DATE:05/29/2006                            DOB:          22-Aug-1955    PROBLEM:  1. Alcoholic liver disease.  Drank 80 oz of beer daily for many years.  Liver tests December 2007  show AST of 139, ALT of 74, total bili rubin 1.6, MCV 102.5.  CT scan  shows normal liver, other than some focal fatty infiltration.  Stopped  drinking 1 month ago, early December 2007.  Since then all her upper  gastrointestinal symptoms have improved burning, abdominal soreness,  dysphagia.  was also started on Protonix 1 month ago.   CURRENT MEDICATIONS:  1. Famotidine.  2. Multivitamin.  3. __________ .  4. Hydrochlorothiazide.  5. Loratadine.  6. Estradiol.  7. Aspirin.  8. Protonix once daily.   PHYSICAL EXAMINATION:  VITAL SIGNS:  100 pounds, up 8 pounds since her  last visit. Blood pressure 140/80, pulse 75.  CONSTITUTIONAL:  Generally, well appearing neurologically alert and  oriented x3.  HEENT:  Eyes:  Extraocular movements intact.  Mouth:  Oral pharynx  moist.  NECK:  Supple, no lymphadenopathy.  ABDOMEN:  Soft, non tender, non distended, normal bowel sounds.   HISTORY:  I last saw Kimberly [name] 1 month ago, since then she stopped  drinking completely and has noticed a complete resolution of her upper  gastrointestinal symptoms.  She had a liver test done just prior to her  quitting showing typical pattern for alcoholic hepatitis.  She is still  bothered by mild constipation, having to strain to move her bowels every  other day.  She was hem positive for microscopic blood a month or 2 ago.   ASSESSMENT/PLAN:  55 year old women with alcoholic hepatitis, likely  resolving since she stopped drinking 1 month ago.  Hem positive, stool  constipation.   First I will arrange for her to have a colonoscopy at her soonest  convince to evaluate her constipation hem positive stool.  I think it is  unlikely anything serious since she is putting weight and she is not  anemic.   I encourage her to continue staying away from alcohol, she seems very  committed to it at this point.  I will not recheck her liver test at  this point as it can take a few months for the acute inflammation from  alcoholic hepatitis to resolve, however she will return to see me in  three months time and I will recheck her liver tests at that point and  if they are still elevated, we will have to consider further workup.  Although, I suspect a majority of her liver tests are indeed related to  alcohol intake.  Her upper gastrointestinal symptoms have all completely  resolved since stopping the alcohol and starting Protonix once daily.  I  do not think we to proceed with upper endoscopy since that is the case.     Kimberly Fee, MD  Electronically Signed    DPJ/MedQ  DD: 05/29/2006  DT: 05/29/2006  Job #: [id number]

## 2010-10-13 NOTE — Assessment & Plan Note (Signed)
[facility]                         GASTROENTEROLOGY OFFICE NOTE   [name], [name]                      MRN:          [id number]  DATE:04/29/2006                            DOB:          1956/02/15    REFERRING PHYSICIAN:  Chauncey [name], D.O.   REASON FOR REFERRAL:  Dr. Landis [name] asked me to evaluate Kimberly [name] in  consultation regarding abdominal pain, weight loss, dysphagia.   HISTORY OF PRESENT ILLNESS:  Kimberly [name] is a very pleasant 55 year old  woman who drinks 40-80 ounces of beer on a daily basis for many years,  who has constellation of upper and lower GI symptoms.  First and  probably most dramatic, she has abdominal pain for at least 6-7 months.  She thinks this started around the time of an upper respiratory  infection many months ago.  She describes the pain as a burning,  sometimes going up into her chest, sometimes going down into her pelvis.  She has been tender in her epigastrium for many months.  She has lost 18-  20 pounds, saying she has very low appetite these days.  She was  evaluated by Dr. Landis [name] recently who heme-checked her.  She was heme  positive for microscopic blood.  She was given samples of Famotidine.  She does not feel that the Famotidine has made much of a difference.  She had labs in October 2007 showing a hemoglobin of 12.3 with an MCV of  101, normal ferritin, normal folate and B12.   She believes she had an upper endoscopy 20 years ago by Dr. Victorino [name].  She does not recall what the context was or what the results were of  that.   She also has chronic constipation.  This has been since she was a very  young girl.  She has tried several different things without much effect,  including fiber supplements, Maalox.  She says she has to strain quite a  lot to move her bowels, even every other day.   REVIEW OF SYSTEMS:  Notable for FOBT positive, weight loss of 18 pounds,  abdominal pain.  The rest of the  review of systems is essentially normal  and is available in her chart.   PAST MEDICAL HISTORY:  Hypertension, alcohol abuse.   CURRENT MEDICATIONS:  Famotidine, multivitamin, aspirin,  hydrochlorothiazide, Loratadine, Estradiol.   ALLERGIES:  PENICILLIN.   SOCIAL HISTORY:  She is single, 2 daughters, nonsmoker, quit 10 years  ago. Drinks 40-80 ounces of beer daily.  No IV drugs.   FAMILY HISTORY:  No colon cancer, colon polyps in family, no history of  pancreatic disease in her family.   PHYSICAL EXAMINATION:  5 feet 4 inches, 92 pounds.  Blood pressure  140/76, pulse 94.  CONSTITUTIONAL:  Generally thin, nervous woman.  NEUROLOGIC:  Alert and oriented x3.  EYES:  Extraocular movements intact.  Oropharynx moist, no lesions.  NECK:  Supple, no lymphadenopathy.  CARDIOVASCULAR:  Regular rate and rhythm.  LUNGS:  Clear to auscultation bilaterally.  ABDOMEN:  Soft, mild to moderately tender mid epigastrium, no peritoneal  signs.  EXTREMITIES:  No lower extremity edema.  SKIN:  No rashes or lesions are visible.   ASSESSMENT AND PLAN:  A 55 year old woman with constellation of GI  symptoms including abdominal pain for several months, burning, weight  loss, dysphagia, FOBT positive, lifelong constipation.   I am most disturbed by her tenderness on exam.  She does not take Goody  Powders or NSAIDS and was H. Pylori negative on recent serum antigens so  that makes peptic ulcer disease less likely.  She drinks quite a bit so  perhaps she has pancreatitis.  With her weight loss this may indicate  underlying neoplasm.   I will begin the workup with a full set of lab tests including a  complete metabolic profile, repeat CBC, a TSH and a TTG level to check  her for sprue.  She will also get a CT scan of her abdomen and pelvis.  She will likely need a colonoscopy and upper endoscopy but I will wait  for the above results to get back before scheduling those.  I have given  her a 4-week  supply of Protonix and instructed her to take this 20-30  minutes prior to her breakfast meal.     Kimberly Fee, MD  Electronically Signed    DPJ/MedQ  DD: 04/29/2006  DT: 04/29/2006  Job #: [id number]   cc:   Kimberly [name], D.O.

## 2010-12-15 ENCOUNTER — Other Ambulatory Visit: Payer: Self-pay | Admitting: Internal Medicine

## 2010-12-15 DIAGNOSIS — Z1231 Encounter for screening mammogram for malignant neoplasm of breast: Secondary | ICD-10-CM

## 2010-12-30 ENCOUNTER — Other Ambulatory Visit: Payer: Self-pay | Admitting: Internal Medicine

## 2011-01-01 ENCOUNTER — Encounter: Payer: Self-pay | Admitting: Internal Medicine

## 2011-01-01 NOTE — Telephone Encounter (Signed)
Note from 2008. The pt is adament about having this medication.  She fully understands the increased risk of heart disease and cancer that is associated with HRT and is willing to accept this risk in order to prevent the debilitating hot flashes she has off this medication.  Will continue to encourage her to try to wean herself off of this medication at our next visit.  2009 note indicated that she tried to wean herself off and got hot flashes. Has not been addressed since then. Will give four month supply so that she has time to get in to see PCP, readdress and document.

## 2011-01-15 ENCOUNTER — Ambulatory Visit (HOSPITAL_COMMUNITY)
Admission: RE | Admit: 2011-01-15 | Discharge: 2011-01-15 | Disposition: A | Discharge status: Home / Self Care | Payer: Self-pay | Source: Ambulatory Visit | Attending: Internal Medicine | Admitting: Internal Medicine

## 2011-01-15 DIAGNOSIS — Z1231 Encounter for screening mammogram for malignant neoplasm of breast: Secondary | ICD-10-CM

## 2011-01-24 ENCOUNTER — Other Ambulatory Visit: Payer: Self-pay | Admitting: Internal Medicine

## 2011-01-24 DIAGNOSIS — I10 Essential (primary) hypertension: Secondary | ICD-10-CM

## 2011-01-30 ENCOUNTER — Encounter: Payer: Self-pay | Admitting: Internal Medicine

## 2011-02-08 ENCOUNTER — Encounter: Payer: Self-pay | Admitting: Internal Medicine

## 2011-02-08 ENCOUNTER — Ambulatory Visit (INDEPENDENT_AMBULATORY_CARE_PROVIDER_SITE_OTHER): Payer: Self-pay | Admitting: Internal Medicine

## 2011-02-08 VITALS — BP 116/69 | HR 71 | Temp 97.1°F | Wt 104.2 lb

## 2011-02-08 DIAGNOSIS — N951 Menopausal and female climacteric states: Secondary | ICD-10-CM

## 2011-02-08 DIAGNOSIS — F329 Major depressive disorder, single episode, unspecified: Secondary | ICD-10-CM

## 2011-02-08 DIAGNOSIS — I10 Essential (primary) hypertension: Secondary | ICD-10-CM

## 2011-02-08 DIAGNOSIS — H6120 Impacted cerumen, unspecified ear: Secondary | ICD-10-CM

## 2011-02-08 DIAGNOSIS — F3289 Other specified depressive episodes: Secondary | ICD-10-CM

## 2011-02-08 DIAGNOSIS — Z23 Encounter for immunization: Secondary | ICD-10-CM

## 2011-02-08 DIAGNOSIS — E785 Hyperlipidemia, unspecified: Secondary | ICD-10-CM

## 2011-02-08 MED ORDER — INFLUENZA VIRUS VACC SPLIT PF IM SUSP: 0.5000 mL | Freq: Once | INTRAMUSCULAR | Status: DC

## 2011-02-08 MED ORDER — AMITRIPTYLINE HCL 25 MG PO TABS: 25.0000 mg | ORAL_TABLET | Freq: Every day | ORAL | Status: DC

## 2011-02-08 NOTE — Progress Notes (Signed)
Subjective:   Patient ID: Kimberly [name] female   DOB: 1956/04/13 55 y.o.   MRN: [id number]  HPI: [name] is a 55 y.o. woman who presents for a routine check up.  She does complain of a bilateral ear ache that has lasted >10years.  She notes that the right ear is worse than the left ear, it aches all the time, it is worse at high altitudes, during weather changes & when in water.  She notes that in the past she took some pills that relieved the pain, but cannot remember the name.  Chart review in [facility] revealed that patient had otitis externa/media in February 2012 that did not improve with neomycin drops but improved with azithromycin.  The pain is slightly different this time, and she notes that her ears feel better when they "pop."    She currently has no other complaints.  Denies CP/dizziness/SOB/HA.  Of note, she reports that her asthma is improved since she stopped smoking.  She uses flovent once daily, and albuterol twice daily.  Past Medical History  Diagnosis Date  . Gallstone   . Hyperlipidemia   . Hypertension   . Depression   . Anemia     macrocytic  . Asthma   . Menopausal syndrome   . GERD (gastroesophageal reflux disease)   . Alcoholic hepatitis   . Tobacco abuse   . Allergy     rhinitis  . Alcohol abuse, in remission     since around 2005  . Otitis externa, acute Feb 2012    bilateral, treated with azithromycin after failure of neomycin drops  . Otitis media, acute Feb 2012  . Vaginal discharge 2011  . Callus of foot 2009  . Chest pain, musculoskeletal 2011  . Excessive cerumen in ear canal     since before 2000   Current Outpatient Prescriptions  Medication Sig Dispense Refill  . albuterol (PROVENTIL HFA;VENTOLIN HFA) 108 (90 BASE) MCG/ACT inhaler Inhale 2 puffs into the lungs every 4 (four) hours as needed for wheezing.  3 g  4  . amitriptyline (ELAVIL) 25 MG tablet Take 1 tablet (25 mg total) by mouth at bedtime.  30 tablet  5  .  desloratadine (CLARINEX) 5 MG tablet Take 5 mg by mouth daily.        Marland Kitchen docusate sodium (COLACE) 100 MG capsule Take 100 mg by mouth at bedtime.        Marland Kitchen estradiol (ESTRACE) 1 MG tablet TAKE ONE TABLET BY MOUTH EVERY DAY  30 tablet  3  . fluticasone (FLOVENT HFA) 110 MCG/ACT inhaler Inhale 2 puffs into the lungs 2 (two) times daily.        . hydrochlorothiazide 25 MG tablet Take 1 tablet (25 mg total) by mouth daily.  30 tablet  11  . Multiple Vitamin (MULTIVITAMIN) capsule Take 1 capsule by mouth daily.        . pantoprazole (PROTONIX) 40 MG tablet Take 1 tablet (40 mg total) by mouth daily.  30 tablet  5  . amitriptyline (ELAVIL) 25 MG tablet Take 1 tablet (25 mg total) by mouth at bedtime.  30 tablet  5   Current Facility-Administered Medications  Medication Dose Route Frequency Provider Last Rate Last Dose  . influenza  inactive virus vaccine (FLUZONE/FLUARIX) injection 0.5 mL  0.5 mL Intramuscular Once Vernice Jefferson, MD       Family History  Problem Relation Age of Onset  . Hypertension Mother    Social History  .  Marital Status: Single   Social History Main Topics  . Smoking status: Former Smoker    Types: Cigarettes    Quit date: 08/21/1995  . Smokeless tobacco: None  . Alcohol Use: No     in remission since 2007  . Drug Use: No  . Sexually Active: No   Review of Systems: General: no fevers, chills, changes in weight, changes in appetite Skin: no rash HEENT: no blurry vision, hearing changes, sore throat Pulm: no dyspnea, coughing, wheezing CV: no chest pain, palpitations, shortness of breath Abd: no abdominal pain, nausea/vomiting, diarrhea/constipation GU: no dysuria, hematuria, polyuria Ext: no arthralgias, myalgias Neuro: no weakness, numbness, or tingling  Objective:  Physical Exam: Filed Vitals:   02/08/11 0824  BP: 116/69  Pulse: 71  Temp: 97.1 F (36.2 C)  TempSrc: Oral  Weight: 104 lb 3.2 oz (47.265 kg)   Constitutional: Vital signs reviewed.   Patient is a well-developed and well-nourished woman in no acute distress and cooperative with exam. Ear: TM normal bilaterally, excessive cerumen in right ear, minimal cerumen in left ear, no erythema or exudates Mouth: no erythema or exudates, MMM Eyes: PERRL, EOMI, conjunctivae normal, No scleral icterus. No conjunctival pallor Neck: Supple, Trachea midline normal ROM,  Cardiovascular: RRR, S1 normal, S2 normal, no MRG, pulses symmetric and intact bilaterally Pulmonary/Chest: CTAB, no wheezes, rales, or rhonchi Abdominal: Soft. Non-tender, non-distended, bowel sounds are normal, no masses or guarding present.  GU: no CVA tenderness Musculoskeletal: No joint deformities, erythema, or stiffness, ROM full and no nontender Hematology: no cervical, inginal, or axillary adenopathy.  Neurological: A&O x3, Strength is normal and symmetric bilaterally, cranial nerve II-XII are grossly intact, no focal motor deficit, sensory intact to light touch bilaterally.  Skin: Warm, dry and intact. No rash, cyanosis, or clubbing.  Psychiatric: Normal mood and affect. speech and behavior is normal. Judgment and thought content normal. Cognition and memory are normal.   Assessment & Plan:  Case and care discussed with Dr. Coralee Pesa. Please see problem oriented charting for further detail. Patient to return next week to follow up with cerumen removal.

## 2011-02-08 NOTE — Progress Notes (Signed)
I agree with Dr. Kapadia's assessment and plan. 

## 2011-02-08 NOTE — Assessment & Plan Note (Signed)
Patient currently has no symptoms of depressed mood or suicidal ideation.  She notes that her depression resolved soon after she quit alcohol (7 years ago), but she does have difficulty sleeping, which is why she takes amitriptyline.  She has tried to wean herself off of amitriptyline in the past, but then she has difficulty resting.  Plan: continue Amytriptyline 25mg 

## 2011-02-08 NOTE — Assessment & Plan Note (Signed)
We re-addressed considering discontinuing HRT, but patient is aware of risks and wants to continue HRT at this time.  She notes that she feels awful when she has tried to wean herself off.  She is up to date with her mammogram and has had a full hysterectomy in the past.    Plan: continue estradiol 1mg 

## 2011-02-08 NOTE — Assessment & Plan Note (Signed)
Patient's BP at goal.  Patient compliant with medications.  Plan: Continue HCTZ 25mg .

## 2011-02-08 NOTE — Assessment & Plan Note (Signed)
Patient's ear ache is likely due to excessive cerumen given history and physical exam.  Plan: Start Murine wax kit (or more affordable alternative suggested per pharmacist) Return in about 1 week for cerumen removal in the clinic.

## 2011-02-08 NOTE — Patient Instructions (Signed)
-  Please return in 1 week to have your blood drawn (must be fasting)  -Please get a wax kit from [facility] (one option is Murine wax kit, but you can ask your pharmacist if there are cheaper options), and use as directed  -If your ears do not improve, please schedule a follow up appointment with me in 1 week to have your ears cleaned out.  -Continue all of your current medications, if you have any new or worsening problems, please call the clinic at [phone].

## 2011-02-08 NOTE — Assessment & Plan Note (Signed)
No lipid panal in 1 year.  Patient not fasting today.  She will return next week to have blood drawn.

## 2011-02-20 ENCOUNTER — Encounter: Payer: Self-pay | Admitting: Internal Medicine

## 2011-02-20 DIAGNOSIS — E785 Hyperlipidemia, unspecified: Secondary | ICD-10-CM

## 2011-02-20 LAB — COMPREHENSIVE METABOLIC PANEL
ALT: <8 U/L (ref 0–35)
AST: 16 U/L (ref 0–37)
Alkaline Phosphatase: 48 U/L (ref 39–117)
BUN: 7 mg/dL (ref 6–23)
Calcium: 9.3 mg/dL (ref 8.4–10.5)
Chloride: 100 mEq/L (ref 96–112)
Creat: 0.71 mg/dL (ref 0.50–1.10)
Potassium: 3.6 mEq/L (ref 3.5–5.3)

## 2011-02-22 NOTE — Progress Notes (Signed)
This encounter was created in error - please disregard.

## 2011-04-23 ENCOUNTER — Other Ambulatory Visit: Payer: Self-pay | Admitting: Internal Medicine

## 2011-04-23 DIAGNOSIS — N951 Menopausal and female climacteric states: Secondary | ICD-10-CM

## 2011-04-30 NOTE — Telephone Encounter (Signed)
I am refilling, but can we schedule her to come in in January please.

## 2011-05-31 ENCOUNTER — Other Ambulatory Visit: Payer: Self-pay | Admitting: *Deleted

## 2011-05-31 ENCOUNTER — Encounter: Payer: Self-pay | Admitting: Gastroenterology

## 2011-05-31 MED ORDER — ALBUTEROL SULFATE HFA 108 (90 BASE) MCG/ACT IN AERS: 2.0000 | INHALATION_SPRAY | RESPIRATORY_TRACT | Status: DC | PRN

## 2011-06-05 NOTE — Telephone Encounter (Signed)
Rx called in 

## 2011-06-06 ENCOUNTER — Encounter: Payer: Self-pay | Admitting: Internal Medicine

## 2011-06-06 ENCOUNTER — Ambulatory Visit (INDEPENDENT_AMBULATORY_CARE_PROVIDER_SITE_OTHER): Payer: Self-pay | Admitting: Internal Medicine

## 2011-06-06 DIAGNOSIS — E785 Hyperlipidemia, unspecified: Secondary | ICD-10-CM

## 2011-06-06 DIAGNOSIS — J45909 Unspecified asthma, uncomplicated: Secondary | ICD-10-CM

## 2011-06-06 DIAGNOSIS — Z0000 Encounter for general adult medical examination without abnormal findings: Secondary | ICD-10-CM

## 2011-06-06 DIAGNOSIS — N951 Menopausal and female climacteric states: Secondary | ICD-10-CM

## 2011-06-06 DIAGNOSIS — H6120 Impacted cerumen, unspecified ear: Secondary | ICD-10-CM

## 2011-06-06 MED ORDER — FLUTICASONE PROPIONATE HFA 110 MCG/ACT IN AERO: 2.0000 | INHALATION_SPRAY | Freq: Two times a day (BID) | RESPIRATORY_TRACT | Status: DC

## 2011-06-06 NOTE — Assessment & Plan Note (Signed)
Ear discomfort is persistent.  She used Murine wax kit with some relief, but not for long.  Did not have cerumen extraction at that time.  Interested in scheduling appt for extraction.  Instructed to use wax kit for 1 week prior to appt so wax is soft before procedure. She will call to make appt.

## 2011-06-06 NOTE — Progress Notes (Signed)
Subjective:   Patient ID: Kimberly [name] female   DOB: 07/27/1955 56 y.o.   MRN: [id number]  HPI: [name] is a 56 y.o. woman who has been on HRT for years after hysterectomy.  Today she is feeling well, no complaints at all, no dizziness/HA/CP/heart palpitations.    Ear ache is acting up again, Murine wax kit didn't help for long, R>L, worst in the shower  Past Medical History  Diagnosis Date  . Gallstone   . Hyperlipidemia   . Hypertension   . Depression   . Anemia     macrocytic  . Asthma   . Menopausal syndrome   . GERD (gastroesophageal reflux disease)   . Alcoholic hepatitis   . Tobacco abuse   . Allergy     rhinitis  . Alcohol abuse, in remission     since around 2005  . Otitis externa, acute Feb 2012    bilateral, treated with azithromycin after failure of neomycin drops  . Otitis media, acute Feb 2012  . Vaginal discharge 2011  . Callus of foot 2009  . Chest pain, musculoskeletal 2011  . Excessive cerumen in ear canal     since before 2000   Current Outpatient Prescriptions  Medication Sig Dispense Refill  . albuterol (PROVENTIL HFA;VENTOLIN HFA) 108 (90 BASE) MCG/ACT inhaler Inhale 2 puffs into the lungs every 4 (four) hours as needed for wheezing.  3 g  4  . amitriptyline (ELAVIL) 25 MG tablet Take 1 tablet (25 mg total) by mouth at bedtime.  30 tablet  5  . aspirin 81 MG tablet Take 160 mg by mouth daily.      Marland Kitchen docusate sodium (COLACE) 100 MG capsule Take 100 mg by mouth at bedtime.        Marland Kitchen estradiol (ESTRACE) 1 MG tablet TAKE ONE TABLET BY MOUTH EVERY DAY  30 tablet  0  . fluticasone (FLOVENT HFA) 110 MCG/ACT inhaler Inhale 2 puffs into the lungs 2 (two) times daily.        . hydrochlorothiazide 25 MG tablet Take 1 tablet (25 mg total) by mouth daily.  30 tablet  11  . pantoprazole (PROTONIX) 40 MG tablet Take 1 tablet (40 mg total) by mouth daily.  30 tablet  5  . Multiple Vitamin (MULTIVITAMIN) capsule Take 1 capsule by mouth daily.          Current Facility-Administered Medications  Medication Dose Route Frequency Provider Last Rate Last Dose  . influenza  inactive virus vaccine (FLUZONE/FLUARIX) injection 0.5 mL  0.5 mL Intramuscular Once Vernice Jefferson, MD       Family History  Problem Relation Age of Onset  . Hypertension Mother    History   Social History  . Marital Status: Single    Spouse Name: N/A    Number of Children: N/A  . Years of Education: N/A   Social History Main Topics  . Smoking status: Former Smoker    Types: Cigarettes    Quit date: 08/21/1995  . Smokeless tobacco: None  . Alcohol Use: No     in remission since 2007  . Drug Use: No  . Sexually Active: No   Other Topics Concern  . None   Social History Narrative   2   Review of Systems: Constitutional: Denies fever, chills, diaphoresis, appetite change and fatigue.  HEENT: Denies photophobia, eye pain, redness, hearing loss, ear pain, congestion, sore throat, rhinorrhea, sneezing, mouth sores, trouble swallowing, neck pain, neck stiffness and tinnitus.  Respiratory: Denies SOB, DOE, cough, chest tightness,  and wheezing.   Cardiovascular: Denies chest pain, palpitations and leg swelling.  Gastrointestinal: Denies nausea, vomiting, abdominal pain, diarrhea, constipation, blood in stool and abdominal distention.  Genitourinary: Denies dysuria, urgency, frequency, hematuria, flank pain and difficulty urinating.  Musculoskeletal: Denies myalgias, back pain, joint swelling, arthralgias and gait problem.  Skin: Denies pallor, rash and wound.  Neurological: Denies dizziness, seizures, syncope, weakness, light-headedness, numbness and headaches.  Psychiatric/Behavioral: Denies suicidal ideation, mood changes, confusion, nervousness, sleep disturbance and agitation  Objective:  Physical Exam: Filed Vitals:   06/06/11 1359  BP: 126/87  Pulse: 72  Temp: 97.1 F (36.2 C)  TempSrc: Oral  Weight: 104 lb 6.4 oz (47.356 kg)  SpO2: 100%    Constitutional: Vital signs reviewed.  Patient is a well-developed and well-nourished woman in no acute distress and cooperative with exam.  Head: Normocephalic and atraumatic Ear: significant cerumen b/l ears Mouth: no erythema or exudates, MMM Eyes: PERRL, EOMI, conjunctivae normal, No scleral icterus.  Neck: Supple, Trachea midline normal ROM, No JVD, mass, thyromegaly Cardiovascular: RRR, S1 normal, S2 normal, no MRG, pulses symmetric and intact bilaterally Pulmonary/Chest: CTAB, no wheezes, rales, or rhonchi Abdominal: Soft. Non-tender, non-distended, bowel sounds are normal, no masses, organomegaly, or guarding present.  GU: no CVA tenderness Musculoskeletal: No joint deformities, erythema, or stiffness, ROM full and no nontender Neurological: A&O x3, Strength is normal and symmetric bilaterally, cranial nerve II-XII are grossly intact, no focal motor deficit, sensory intact to light touch bilaterally.  Skin: Warm, dry and intact. No rash, cyanosis, or clubbing.  Psychiatric: Normal mood and affect. speech and behavior is normal. Judgment and thought content normal. Cognition and memory are normal.   Assessment & Plan:  Case and care discussed with Dr. Coralee Pesa. Pt to return in 1 month to make sure she is doing well off of HRT.  She will also make appt for cerumen removal. Please see problem oriented charting for further details.

## 2011-06-06 NOTE — Assessment & Plan Note (Signed)
No recent exacerbations. Albuterol refilled because current inhaler is expired.

## 2011-06-06 NOTE — Assessment & Plan Note (Signed)
Discontinued HRT today, pt to follow up in 1 month to discuss and symptoms.  I promised that if she has return of menopausal type symptoms, we can restart medication.

## 2011-06-06 NOTE — Patient Instructions (Signed)
-  Discontinue estriol for 1 month, if you have any new/worsening symptoms, we will restart your hormone replacement therapy.  -Make a nurse appointment to have your ear wax removed, please use ear wax drops 1 week before to soften wax.  -If you have any new or worsening symptoms, please called [phone].    -I refilled your albuterol inhaler today.

## 2011-06-07 LAB — LIPID PANEL
HDL: 87 mg/dL (ref >39)
LDL Cholesterol: 111 mg/dL — ABNORMAL HIGH (ref 0–99)
Total CHOL/HDL Ratio: 2.4 Ratio

## 2011-06-07 NOTE — Progress Notes (Signed)
I agree with Dr. Kapadia's assessment and plan. 

## 2011-07-05 ENCOUNTER — Other Ambulatory Visit: Payer: Self-pay | Admitting: Internal Medicine

## 2011-07-05 ENCOUNTER — Telehealth: Payer: Self-pay | Admitting: *Deleted

## 2011-07-05 MED ORDER — ESTRADIOL 1 MG PO TABS: 1.0000 mg | ORAL_TABLET | Freq: Every day | ORAL | Status: DC

## 2011-07-05 NOTE — Telephone Encounter (Signed)
I will call to discuss with patient and then proceed.

## 2011-07-05 NOTE — Telephone Encounter (Signed)
Pt came to the clinic this am.

## 2011-07-05 NOTE — Telephone Encounter (Signed)
Request refill on :  Estradiol 1mg  - Take 1 tab daily; [facility] on [address]. Thanks

## 2011-07-05 NOTE — Telephone Encounter (Signed)
Did patient call for medication or from pharmacy?

## 2011-07-05 NOTE — Progress Notes (Signed)
Called patient to discuss restarting estradiol.  She reports that hot flashes have worsened while trial off of HRT.  She continues to understand risks of HRT, but would like to restart medication.  Will restart estradiol 1mg  tablet daily.

## 2011-07-12 ENCOUNTER — Other Ambulatory Visit: Payer: Self-pay | Admitting: *Deleted

## 2011-07-12 NOTE — Telephone Encounter (Signed)
Pt has re-enrolled in [facility] - need new rxs. [facility] wants to know if pt will need Clarinex 5mg  Reditabs also   Take 1 tab by mouth every day Qty#90 , if so they'll need new rx for this med also.  Thanks

## 2011-07-14 ENCOUNTER — Other Ambulatory Visit: Payer: Self-pay | Admitting: Internal Medicine

## 2011-07-14 MED ORDER — PANTOPRAZOLE SODIUM 40 MG PO TBEC: 40.0000 mg | DELAYED_RELEASE_TABLET | Freq: Every day | ORAL | Status: DC

## 2011-07-16 ENCOUNTER — Telehealth: Payer: Self-pay | Admitting: *Deleted

## 2011-07-16 NOTE — Telephone Encounter (Signed)
Fax from [facility] - pt has re-enrolled in [facility], wants to know if pt should be on Clarinex if so needs new rx for:   Clarinex Reditabs 5mg  - Take 1 tablet by mouth every day Qty#90.

## 2011-07-16 NOTE — Telephone Encounter (Signed)
Protonix refilled - rx refill request faxed to [facility].

## 2011-07-16 NOTE — Telephone Encounter (Signed)
At last visit, pt told me she does not take clarinex anymore so we discontinued it.  Let me know if she calls and this has changed.  Thanks!

## 2011-07-16 NOTE — Telephone Encounter (Signed)
[facility] pharmacy made awared of Clarinex not needed per Dr Milbert Coulter.

## 2011-10-23 ENCOUNTER — Other Ambulatory Visit: Payer: Self-pay | Admitting: *Deleted

## 2011-10-23 MED ORDER — ESTRADIOL 1 MG PO TABS: 1.0000 mg | ORAL_TABLET | Freq: Every day | ORAL | Status: DC

## 2011-10-23 NOTE — Telephone Encounter (Signed)
Will refill for 1 month and defer to PCP for further refills.

## 2011-10-24 NOTE — Telephone Encounter (Signed)
Dr Milbert Coulter was on vacation at this time.

## 2011-11-16 ENCOUNTER — Other Ambulatory Visit: Payer: Self-pay | Admitting: *Deleted

## 2011-11-16 MED ORDER — PANTOPRAZOLE SODIUM 40 MG PO TBEC: 40.0000 mg | DELAYED_RELEASE_TABLET | Freq: Every day | ORAL | Status: DC

## 2011-11-16 NOTE — Telephone Encounter (Signed)
Requesting 90-Day Supply. Thanks

## 2011-11-19 NOTE — Telephone Encounter (Signed)
Protonix rx refilled - request form faxed to [facility].

## 2011-12-02 ENCOUNTER — Other Ambulatory Visit: Payer: Self-pay | Admitting: Internal Medicine

## 2011-12-04 ENCOUNTER — Other Ambulatory Visit: Payer: Self-pay | Admitting: *Deleted

## 2011-12-04 MED ORDER — FLUTICASONE PROPIONATE HFA 110 MCG/ACT IN AERO: 2.0000 | INHALATION_SPRAY | Freq: Two times a day (BID) | RESPIRATORY_TRACT | Status: DC

## 2011-12-04 NOTE — Telephone Encounter (Signed)
Flovent refilled - request form faxed to [facility].

## 2011-12-04 NOTE — Telephone Encounter (Signed)
Refill approved - nurse to complete. 

## 2011-12-21 ENCOUNTER — Other Ambulatory Visit: Payer: Self-pay | Admitting: Internal Medicine

## 2011-12-21 ENCOUNTER — Other Ambulatory Visit (HOSPITAL_COMMUNITY): Payer: Self-pay | Admitting: Emergency Medicine

## 2011-12-21 DIAGNOSIS — Z1231 Encounter for screening mammogram for malignant neoplasm of breast: Secondary | ICD-10-CM

## 2011-12-30 ENCOUNTER — Other Ambulatory Visit: Payer: Self-pay | Admitting: Internal Medicine

## 2011-12-30 DIAGNOSIS — F329 Major depressive disorder, single episode, unspecified: Secondary | ICD-10-CM

## 2011-12-31 MED ORDER — AMITRIPTYLINE HCL 25 MG PO TABS: 25.0000 mg | ORAL_TABLET | Freq: Every day | ORAL | Status: DC

## 2012-01-17 ENCOUNTER — Ambulatory Visit (HOSPITAL_COMMUNITY)
Admission: RE | Admit: 2012-01-17 | Discharge: 2012-01-17 | Disposition: A | Discharge status: Home / Self Care | Payer: Self-pay | Source: Ambulatory Visit | Attending: Emergency Medicine | Admitting: Emergency Medicine

## 2012-01-17 DIAGNOSIS — Z1231 Encounter for screening mammogram for malignant neoplasm of breast: Secondary | ICD-10-CM | POA: Insufficient documentation

## 2012-01-25 ENCOUNTER — Other Ambulatory Visit: Payer: Self-pay | Admitting: Internal Medicine

## 2012-01-25 DIAGNOSIS — I10 Essential (primary) hypertension: Secondary | ICD-10-CM

## 2012-02-04 ENCOUNTER — Encounter: Payer: Self-pay | Admitting: Internal Medicine

## 2012-02-26 ENCOUNTER — Other Ambulatory Visit: Payer: Self-pay | Admitting: *Deleted

## 2012-02-26 DIAGNOSIS — I10 Essential (primary) hypertension: Secondary | ICD-10-CM

## 2012-02-27 MED ORDER — HYDROCHLOROTHIAZIDE 25 MG PO TABS: 25.0000 mg | ORAL_TABLET | Freq: Every day | ORAL | Status: DC

## 2012-02-28 ENCOUNTER — Encounter: Payer: Self-pay | Admitting: Gastroenterology

## 2012-03-18 ENCOUNTER — Other Ambulatory Visit: Payer: Self-pay | Admitting: *Deleted

## 2012-03-18 MED ORDER — ALBUTEROL SULFATE HFA 108 (90 BASE) MCG/ACT IN AERS: 2.0000 | INHALATION_SPRAY | RESPIRATORY_TRACT | Status: DC | PRN

## 2012-03-19 NOTE — Telephone Encounter (Signed)
Proventil refilled - refill request faxed to [facility].

## 2012-03-21 ENCOUNTER — Other Ambulatory Visit: Payer: Self-pay | Admitting: *Deleted

## 2012-03-24 MED ORDER — PANTOPRAZOLE SODIUM 40 MG PO TBEC: 40.0000 mg | DELAYED_RELEASE_TABLET | Freq: Every day | ORAL | Status: DC

## 2012-03-25 NOTE — Telephone Encounter (Signed)
Protonix rx called to [facility]. 

## 2012-04-10 ENCOUNTER — Other Ambulatory Visit: Payer: Self-pay | Admitting: *Deleted

## 2012-04-10 DIAGNOSIS — I10 Essential (primary) hypertension: Secondary | ICD-10-CM

## 2012-04-11 MED ORDER — HYDROCHLOROTHIAZIDE 25 MG PO TABS: 25.0000 mg | ORAL_TABLET | Freq: Every day | ORAL | Status: DC

## 2012-04-18 ENCOUNTER — Other Ambulatory Visit: Payer: Self-pay | Admitting: *Deleted

## 2012-04-18 DIAGNOSIS — I10 Essential (primary) hypertension: Secondary | ICD-10-CM

## 2012-04-18 MED ORDER — HYDROCHLOROTHIAZIDE 25 MG PO TABS: 25.0000 mg | ORAL_TABLET | Freq: Every day | ORAL | Status: DC

## 2012-05-07 ENCOUNTER — Encounter: Payer: Self-pay | Admitting: Internal Medicine

## 2012-05-07 ENCOUNTER — Telehealth: Payer: Self-pay | Admitting: Internal Medicine

## 2012-05-07 ENCOUNTER — Ambulatory Visit (INDEPENDENT_AMBULATORY_CARE_PROVIDER_SITE_OTHER): Payer: Self-pay | Admitting: Internal Medicine

## 2012-05-07 VITALS — BP 146/76 | HR 106 | Temp 98.0°F | Wt 106.3 lb

## 2012-05-07 DIAGNOSIS — M62830 Muscle spasm of back: Secondary | ICD-10-CM | POA: Insufficient documentation

## 2012-05-07 DIAGNOSIS — D649 Anemia, unspecified: Secondary | ICD-10-CM

## 2012-05-07 DIAGNOSIS — I10 Essential (primary) hypertension: Secondary | ICD-10-CM

## 2012-05-07 DIAGNOSIS — Z23 Encounter for immunization: Secondary | ICD-10-CM

## 2012-05-07 DIAGNOSIS — N951 Menopausal and female climacteric states: Secondary | ICD-10-CM

## 2012-05-07 DIAGNOSIS — N959 Unspecified menopausal and perimenopausal disorder: Secondary | ICD-10-CM

## 2012-05-07 DIAGNOSIS — G4700 Insomnia, unspecified: Secondary | ICD-10-CM

## 2012-05-07 DIAGNOSIS — M5380 Other specified dorsopathies, site unspecified: Secondary | ICD-10-CM

## 2012-05-07 DIAGNOSIS — K219 Gastro-esophageal reflux disease without esophagitis: Secondary | ICD-10-CM

## 2012-05-07 LAB — CBC WITH DIFFERENTIAL/PLATELET
Basophils Absolute: 0.1 10*3/uL (ref 0.0–0.1)
Eosinophils Absolute: 0.1 10*3/uL (ref 0.0–0.7)
Eosinophils Relative: 1 % (ref 0–5)
HCT: 22.8 % — ABNORMAL LOW (ref 36.0–46.0)
Lymphocytes Relative: 23 % (ref 12–46)
Lymphs Abs: 1.7 10*3/uL (ref 0.7–4.0)
MCH: 18.5 pg — ABNORMAL LOW (ref 26.0–34.0)
MCV: 67.7 fL — ABNORMAL LOW (ref 78.0–100.0)
Monocytes Absolute: 0.5 10*3/uL (ref 0.1–1.0)
Platelets: 568 10*3/uL — ABNORMAL HIGH (ref 150–400)
RDW: 16 % — ABNORMAL HIGH (ref 11.5–15.5)

## 2012-05-07 LAB — LIPID PANEL
HDL: 60 mg/dL (ref >39)
LDL Cholesterol: 82 mg/dL (ref 0–99)
Total CHOL/HDL Ratio: 2.8 Ratio

## 2012-05-07 LAB — COMPREHENSIVE METABOLIC PANEL
ALT: <8 U/L (ref 0–35)
Alkaline Phosphatase: 58 U/L (ref 39–117)
Creat: 0.71 mg/dL (ref 0.50–1.10)
Sodium: 139 mEq/L (ref 135–145)
Total Bilirubin: 0.3 mg/dL (ref 0.3–1.2)
Total Protein: 7 g/dL (ref 6.0–8.3)

## 2012-05-07 MED ORDER — METHOCARBAMOL 500 MG PO TABS: 500.0000 mg | ORAL_TABLET | Freq: Four times a day (QID) | ORAL | Status: DC

## 2012-05-07 NOTE — Assessment & Plan Note (Signed)
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

## 2012-05-07 NOTE — Patient Instructions (Signed)
General Instructions:  -We are discontinuing your estrogen - if you have symptoms that are not tolerable, please call the clinic.  -I sent in a medication called methocarbamol to [facility]; you may also take motrin for a short period to help with symptoms  Please be sure to bring all of your medications with you to every visit.  Should you have any new or worsening symptoms, please be sure to call the clinic at [phone].    Progress Toward Treatment Goals:  Treatment Goal 05/07/2012  Blood pressure at goal    Self Care Goals & Plans:  Self Care Goal 05/07/2012  Manage my medications take my medicines as prescribed; bring my medications to every visit; refill my medications on time  Monitor my health keep track of my blood pressure  Be physically active find an activity I enjoy       Care Management & Community Referrals:  Referral 05/07/2012  Referrals made for care management support none needed

## 2012-05-07 NOTE — Assessment & Plan Note (Signed)
Today we discontinued estradiol - patient has actually gone without medication and notes her only symptoms is sweating, worse in the mroning.  Symptom is manageable at present, but if becomes bothersome, will consider gabapentin or SSRI therapy

## 2012-05-07 NOTE — Assessment & Plan Note (Signed)
Stable with amitriptyline 25 qHS.

## 2012-05-07 NOTE — Progress Notes (Signed)
Subjective:   Patient ID: Kimberly [name] female   DOB: 1955-09-21 56 y.o.   MRN: [id number]  HPI: [name] is a 56 y.o. woman with h/o depressio & HTN who presents for routine HTN follow up.  Since our last visit, she notes that there have been no significant changes except that she had a cold during thanksgiving.    She c/o muscle spasm, intermittent, left shoulder, tender to touch, better with laying down, worse with mvmt; doesn't want to take motrin; tried tylenol 500 without relief.   Past Medical History  Diagnosis Date  . Gallstone   . Hyperlipidemia   . Hypertension   . Depression   . Anemia     macrocytic  . Asthma   . Menopausal syndrome   . GERD (gastroesophageal reflux disease)   . Alcoholic hepatitis   . Tobacco abuse   . Allergy     rhinitis  . Alcohol abuse, in remission     since around 2005  . Otitis externa, acute Feb 2012    bilateral, treated with azithromycin after failure of neomycin drops  . Otitis media, acute Feb 2012  . Vaginal discharge 2011  . Callus of foot 2009  . Chest pain, musculoskeletal 2011  . Excessive cerumen in ear canal     since before 2000   Current Outpatient Prescriptions  Medication Sig Dispense Refill  . albuterol (PROVENTIL HFA;VENTOLIN HFA) 108 (90 BASE) MCG/ACT inhaler Inhale 2 puffs into the lungs every 4 (four) hours as needed for wheezing.  1 Inhaler  3  . amitriptyline (ELAVIL) 25 MG tablet Take 1 tablet (25 mg total) by mouth at bedtime.  30 tablet  5  . aspirin 81 MG tablet Take 160 mg by mouth daily.      Marland Kitchen docusate sodium (COLACE) 100 MG capsule Take 100 mg by mouth at bedtime.        Marland Kitchen estradiol (ESTRACE) 1 MG tablet TAKE ONE TABLET BY MOUTH EVERY DAY  30 tablet  3  . fluticasone (FLOVENT HFA) 110 MCG/ACT inhaler Inhale 2 puffs into the lungs 2 (two) times daily.  3 Inhaler  1  . hydrochlorothiazide (HYDRODIURIL) 25 MG tablet Take 1 tablet (25 mg total) by mouth daily.  90 tablet  1  . Multiple Vitamin  (MULTIVITAMIN) capsule Take 1 capsule by mouth daily.        . pantoprazole (PROTONIX) 40 MG tablet Take 1 tablet (40 mg total) by mouth daily.  90 tablet  0   Current Facility-Administered Medications  Medication Dose Route Frequency Provider Last Rate Last Dose  . influenza  inactive virus vaccine (FLUZONE/FLUARIX) injection 0.5 mL  0.5 mL Intramuscular Once Belia Heman, MD       Family History  Problem Relation Age of Onset  . Hypertension Mother    History   Social History  . Marital Status: Single    Spouse Name: N/A    Number of Children: N/A  . Years of Education: N/A   Social History Main Topics  . Smoking status: Former Smoker    Types: Cigarettes    Quit date: 08/21/1995  . Smokeless tobacco: None  . Alcohol Use: No     Comment: in remission since 2007  . Drug Use: No  . Sexually Active: No   Other Topics Concern  . None   Social History Narrative   2   Review of Systems: Constitutional: Denies fever, chills, diaphoresis, appetite change and fatigue.  HEENT:  Denies photophobia, eye pain, redness, hearing loss, ear pain, congestion, sore throat, rhinorrhea, sneezing, mouth sores, trouble swallowing, neck pain, neck stiffness and tinnitus.   Respiratory: Denies SOB, DOE, cough, chest tightness,  and wheezing.   Cardiovascular: Denies chest pain, palpitations and leg swelling.  Gastrointestinal: Denies nausea, vomiting, abdominal pain, diarrhea, constipation, blood in stool and abdominal distention.  Genitourinary: Denies dysuria, urgency, frequency, hematuria, flank pain and difficulty urinating.  Musculoskeletal: Denies myalgias, back pain, joint swelling, arthralgias and gait problem.  Skin: Denies pallor, rash and wound.  Neurological: Denies dizziness, seizures, syncope, weakness, light-headedness, numbness and headaches.  Psychiatric/Behavioral: Denies suicidal ideation, mood changes, confusion, nervousness, sleep disturbance and agitation  Objective:   Physical Exam: Filed Vitals:   05/07/12 1334  BP: 146/76  Pulse: 106  Temp: 98 F (36.7 C)  TempSrc: Oral  Weight: 106 lb 4.8 oz (48.217 kg)  SpO2: 100%  Repeat blood pressure revealed systolic in the 120s.   Constitutional: Vital signs reviewed.  Patient is a well-developed and well-nourished woman in no acute distress and cooperative with exam.  Head: Normocephalic and atraumatic Ear: cerumen in b/l ears Eyes: PERRL, EOMI, conjunctivae normal, No scleral icterus.  Neck: Supple, Trachea midline normal ROM, No JVD, mass, thyromegaly, or carotid bruit present.  Cardiovascular: RRR, S1 normal, S2 normal, no MRG, pulses symmetric and intact bilaterally Pulmonary/Chest: CTAB, no wheezes, rales, or rhonchi Abdominal: Soft. Non-tender, non-distended, bowel sounds are normal, no masses, organomegaly, or guarding present.  Musculoskeletal: No joint deformities, erythema, or stiffness, ROM full; palpable muscle contracture of trapezius medial to left scapula Neurological: A&O x3, Strength is normal and symmetric bilaterally, cranial nerve II-XII are grossly intact, no focal motor deficit, sensory intact to light touch bilaterally.  Skin: Warm, dry and intact. No rash, cyanosis, or clubbing.  Psychiatric: Normal mood and affect. speech and behavior is normal. Judgment and thought content normal. Cognition and memory are normal.   Assessment & Plan:  Case and care discussed with Dr. Meredith Pel. Patient to return in 6 months for blood pressure follow up.

## 2012-05-07 NOTE — Telephone Encounter (Signed)
Lab called to inform critical value of Hb of 6.2.  I reviewed her chart and she had a normal Hb of 12.5 in 06/2010.  Dr. Everardo Beals ordered a CBC today during office visit because patient had slightly mild leukopenia last year and patient was not complaining of any bleeding today in office.  I called and spoke to patient who is completely asymptomatic at this time. She denies any chest pain, SOB, bright red blood per rectum, melena at this time but she did say that every once in a while she does have some "dark stool", or any obvious source of bleeding or lightheadedness. She states that she had a colonoscopy in 2008 with some polyps removal with [facility].  No family or personal history of colon cancer.  This could be a lab error or she may be chronically bleeding over the past year.  She is on ASA but no other NSAIDs or steroids.  She is currently on Protonix 40mg  qd.  I instructed patient to go to the ED if she becomes symptomatic tonight.  She is also to return to clinic the morning for repeat CBC to make sure that it is not a lab error and an anemia panel. She will also need an FOBT tomorrow.  She will need to follow up with GI for further work-up of her anemia. I discussed case with Dr. Everardo Beals who agreed with plans.

## 2012-05-07 NOTE — Assessment & Plan Note (Signed)
Lab Results  Component Value Date   NA 139 02/20/2011   K 3.6 02/20/2011   CL 100 02/20/2011   CO2 28 02/20/2011   BUN 7 02/20/2011   CREATININE 0.71 02/20/2011   CREATININE 0.70 07/16/2010    BP Readings from Last 3 Encounters:  05/07/12 146/76  06/06/11 126/87  02/08/11 116/69    Assessment: Hypertension control:  mildly elevated  Progress toward goals:  deteriorated Barriers to meeting goals:  no barriers identified  Plan: Hypertension treatment:  continue current medications - patient's BP has been well controlled in the recent past - will continue to monitor; will check cmet, cbc & lipid panel today

## 2012-05-08 ENCOUNTER — Encounter (HOSPITAL_COMMUNITY): Payer: Self-pay | Admitting: *Deleted

## 2012-05-08 ENCOUNTER — Other Ambulatory Visit: Payer: Self-pay

## 2012-05-08 ENCOUNTER — Observation Stay (HOSPITAL_COMMUNITY)
Admission: EM | Admit: 2012-05-08 | Discharge: 2012-05-10 | Disposition: A | Discharge status: Home / Self Care | Payer: Self-pay | Attending: Internal Medicine | Admitting: Internal Medicine

## 2012-05-08 ENCOUNTER — Observation Stay (HOSPITAL_COMMUNITY): Payer: Self-pay

## 2012-05-08 DIAGNOSIS — F5083 Pica in adults: Secondary | ICD-10-CM | POA: Diagnosis present

## 2012-05-08 DIAGNOSIS — K31819 Angiodysplasia of stomach and duodenum without bleeding: Secondary | ICD-10-CM | POA: Diagnosis present

## 2012-05-08 DIAGNOSIS — F329 Major depressive disorder, single episode, unspecified: Secondary | ICD-10-CM

## 2012-05-08 DIAGNOSIS — I10 Essential (primary) hypertension: Secondary | ICD-10-CM | POA: Diagnosis present

## 2012-05-08 DIAGNOSIS — K31811 Angiodysplasia of stomach and duodenum with bleeding: Secondary | ICD-10-CM | POA: Insufficient documentation

## 2012-05-08 DIAGNOSIS — R42 Dizziness and giddiness: Secondary | ICD-10-CM | POA: Diagnosis present

## 2012-05-08 DIAGNOSIS — F5089 Other specified eating disorder: Secondary | ICD-10-CM | POA: Insufficient documentation

## 2012-05-08 DIAGNOSIS — D509 Iron deficiency anemia, unspecified: Principal | ICD-10-CM | POA: Diagnosis present

## 2012-05-08 DIAGNOSIS — E876 Hypokalemia: Secondary | ICD-10-CM | POA: Diagnosis present

## 2012-05-08 DIAGNOSIS — R531 Weakness: Secondary | ICD-10-CM

## 2012-05-08 HISTORY — DX: Calculus of gallbladder without cholecystitis without obstruction: K80.20

## 2012-05-08 HISTORY — DX: Other specified postprocedural states: Z98.890

## 2012-05-08 HISTORY — DX: Nausea with vomiting, unspecified: R11.2

## 2012-05-08 LAB — BASIC METABOLIC PANEL
BUN: 10 mg/dL (ref 6–23)
Calcium: 9.4 mg/dL (ref 8.4–10.5)
Creatinine, Ser: 0.65 mg/dL (ref 0.50–1.10)
GFR calc non Af Amer: >90 mL/min (ref >90)
Glucose, Bld: 100 mg/dL — ABNORMAL HIGH (ref 70–99)
Sodium: 138 mEq/L (ref 135–145)

## 2012-05-08 LAB — CBC
Hemoglobin: 9.5 g/dL — ABNORMAL LOW (ref 12.0–15.0)
MCH: 22.8 pg — ABNORMAL LOW (ref 26.0–34.0)
RBC: 4.17 MIL/uL (ref 3.87–5.11)

## 2012-05-08 LAB — TECHNOLOGIST SMEAR REVIEW

## 2012-05-08 LAB — RETICULOCYTES
RBC.: 3.3 MIL/uL — ABNORMAL LOW (ref 3.87–5.11)
RBC.: 4.24 MIL/uL (ref 3.87–5.11)
Retic Ct Pct: 1 % (ref 0.4–3.1)
Retic Ct Pct: 0.8 % (ref 0.4–3.1)

## 2012-05-08 LAB — URINALYSIS, ROUTINE W REFLEX MICROSCOPIC
Bilirubin Urine: NEGATIVE
Glucose, UA: NEGATIVE mg/dL
Hgb urine dipstick: NEGATIVE
Ketones, ur: NEGATIVE mg/dL
Protein, ur: NEGATIVE mg/dL
Urobilinogen, UA: 0.2 mg/dL (ref 0.0–1.0)

## 2012-05-08 LAB — URINE MICROSCOPIC-ADD ON

## 2012-05-08 LAB — CBC WITH DIFFERENTIAL/PLATELET
Basophils Relative: 1 % (ref 0–1)
Basophils Relative: 1 % (ref 0–1)
Eosinophils Absolute: 0 10*3/uL (ref 0.0–0.7)
HCT: 30.6 % — ABNORMAL LOW (ref 36.0–46.0)
Hemoglobin: 6.5 g/dL — CL (ref 12.0–15.0)
Hemoglobin: 9.6 g/dL — ABNORMAL LOW (ref 12.0–15.0)
Lymphs Abs: 0.7 10*3/uL (ref 0.7–4.0)
Lymphs Abs: 1.4 10*3/uL (ref 0.7–4.0)
MCH: 19.2 pg — ABNORMAL LOW (ref 26.0–34.0)
MCH: 22.6 pg — ABNORMAL LOW (ref 26.0–34.0)
MCHC: 27.9 g/dL — ABNORMAL LOW (ref 30.0–36.0)
MCHC: 31.4 g/dL (ref 30.0–36.0)
MCV: 68.7 fL — ABNORMAL LOW (ref 78.0–100.0)
Monocytes Absolute: 0.2 10*3/uL (ref 0.1–1.0)
Monocytes Absolute: 0.5 10*3/uL (ref 0.1–1.0)
Monocytes Relative: 9 % (ref 3–12)
Neutro Abs: 3.5 10*3/uL (ref 1.7–7.7)
Neutrophils Relative %: 87 % — ABNORMAL HIGH (ref 43–77)

## 2012-05-08 LAB — MAGNESIUM: Magnesium: 2.2 mg/dL (ref 1.5–2.5)

## 2012-05-08 LAB — FOLATE: Folate: 13.4 ng/mL

## 2012-05-08 LAB — IRON AND TIBC

## 2012-05-08 LAB — VITAMIN B12: Vitamin B-12: 368 pg/mL (ref 211–911)

## 2012-05-08 LAB — PROTIME-INR: INR: 1.08 (ref 0.00–1.49)

## 2012-05-08 LAB — OCCULT BLOOD, POC DEVICE: Fecal Occult Bld: NEGATIVE

## 2012-05-08 LAB — ABO/RH: ABO/RH(D): O POS

## 2012-05-08 MED ORDER — SODIUM CHLORIDE 0.9 % IV SOLN: 250.0000 mL | INTRAVENOUS | Status: DC | PRN

## 2012-05-08 MED ORDER — METOCLOPRAMIDE HCL 5 MG/ML IJ SOLN
10.0000 mg | Freq: Once | INTRAMUSCULAR | Status: AC
  Administered 2012-05-08: 10 mg via INTRAVENOUS
  Filled 2012-05-08: qty 2

## 2012-05-08 MED ORDER — PANTOPRAZOLE SODIUM 40 MG PO TBEC
40.0000 mg | DELAYED_RELEASE_TABLET | Freq: Every day | ORAL | Status: DC
  Administered 2012-05-08 – 2012-05-10 (×4): 40 mg via ORAL
  Filled 2012-05-08 (×3): qty 1

## 2012-05-08 MED ORDER — ONDANSETRON HCL 4 MG PO TABS: 4.0000 mg | ORAL_TABLET | Freq: Four times a day (QID) | ORAL | Status: DC | PRN

## 2012-05-08 MED ORDER — BISACODYL 5 MG PO TBEC
5.0000 mg | DELAYED_RELEASE_TABLET | Freq: Two times a day (BID) | ORAL | Status: AC
  Administered 2012-05-08 (×2): 5 mg via ORAL
  Filled 2012-05-08: qty 1

## 2012-05-08 MED ORDER — SODIUM CHLORIDE 0.9 % IJ SOLN: 3.0000 mL | INTRAMUSCULAR | Status: DC | PRN

## 2012-05-08 MED ORDER — PEG-KCL-NACL-NASULF-NA ASC-C 100 G PO SOLR: 1.0000 | Freq: Once | ORAL | Status: DC

## 2012-05-08 MED ORDER — FLUTICASONE PROPIONATE HFA 110 MCG/ACT IN AERO
2.0000 | INHALATION_SPRAY | Freq: Two times a day (BID) | RESPIRATORY_TRACT | Status: DC
  Administered 2012-05-08 – 2012-05-10 (×3): 2 via RESPIRATORY_TRACT
  Filled 2012-05-08: qty 12

## 2012-05-08 MED ORDER — POTASSIUM CHLORIDE CRYS ER 20 MEQ PO TBCR
40.0000 meq | EXTENDED_RELEASE_TABLET | Freq: Once | ORAL | Status: AC
  Administered 2012-05-08: 40 meq via ORAL
  Filled 2012-05-08: qty 2

## 2012-05-08 MED ORDER — SODIUM CHLORIDE 0.9 % IJ SOLN
3.0000 mL | Freq: Two times a day (BID) | INTRAMUSCULAR | Status: DC
  Administered 2012-05-08 – 2012-05-09 (×3): 3 mL via INTRAVENOUS

## 2012-05-08 MED ORDER — PEG-KCL-NACL-NASULF-NA ASC-C 100 G PO SOLR
0.5000 | Freq: Once | ORAL | Status: AC
  Administered 2012-05-08: 18:00:00 via ORAL
  Filled 2012-05-08: qty 1

## 2012-05-08 MED ORDER — SODIUM CHLORIDE 0.9 % IV SOLN
INTRAVENOUS | Status: DC
  Administered 2012-05-08 – 2012-05-10 (×2): via INTRAVENOUS

## 2012-05-08 MED ORDER — AMITRIPTYLINE HCL 25 MG PO TABS
25.0000 mg | ORAL_TABLET | Freq: Every day | ORAL | Status: DC
  Administered 2012-05-08 – 2012-05-09 (×2): 25 mg via ORAL
  Filled 2012-05-08 (×4): qty 1

## 2012-05-08 MED ORDER — METOCLOPRAMIDE HCL 5 MG/ML IJ SOLN
10.0000 mg | Freq: Once | INTRAMUSCULAR | Status: AC
  Administered 2012-05-09: 10 mg via INTRAVENOUS
  Filled 2012-05-08 (×2): qty 2

## 2012-05-08 MED ORDER — PEG-KCL-NACL-NASULF-NA ASC-C 100 G PO SOLR
0.5000 | Freq: Once | ORAL | Status: AC
  Administered 2012-05-09: 50 g via ORAL

## 2012-05-08 MED ORDER — ONDANSETRON HCL 4 MG/2ML IJ SOLN: 4.0000 mg | Freq: Four times a day (QID) | INTRAMUSCULAR | Status: DC | PRN

## 2012-05-08 MED ORDER — PANTOPRAZOLE SODIUM 40 MG IV SOLR
40.0000 mg | Freq: Two times a day (BID) | INTRAVENOUS | Status: DC
  Filled 2012-05-08 (×2): qty 40

## 2012-05-08 MED ORDER — ALBUTEROL SULFATE HFA 108 (90 BASE) MCG/ACT IN AERS
2.0000 | INHALATION_SPRAY | RESPIRATORY_TRACT | Status: DC | PRN
  Filled 2012-05-08: qty 6.7

## 2012-05-08 NOTE — Consult Note (Signed)
[facility] Consult: 8:35 AM 05/08/2012   Referring Provider: Dr Julieta Bellini  Primary Care Physician:  Vernice Jefferson, MD Primary Gastroenterologist:  Dr. Rob Bunting  Daughters:  Misty Stanley:  [phone]  Reason for Consultation:  Microcytic anemia.   HPI: Kimberly [name] is a 56 y.o. female.  Admitted with symptomatic anemia.  Hgb 6.2 12/11, 6.5 this AM.  Last Hgb 12.5 in 06/2010. Low MCV. Takes three 81 mg ASA daily. FOB negative on stool spec today. Reports one dark/black, formed stool in the last 7 to 10 days.  No blood per rectum, no maroon or fouler than usual smelling stool.  No hematuria.  No nose bleeds or excessive bleeding from skin or orifices.  No large/excessive bruising.  No abd pain, no anorexia, no weight loss.  No dysphagia. No NSAIDs except ASA.  Stopped all etoh in 1996/97.  No pelvic pressure. Hysterectomy was in the 80s, no gyn exams since.  Eats laundry starch, 1 box every 3 days.  Chews ice a lot.  Never transfused in past nor prescribed po or IV iron.    ENDOSCOPIC STUDIES: Unable to locate EGD/Colon reports.  Colon polyp removed 2008. She says she had EGD many years ago by Dr Corinda Gubler  Pathology of 05/2006:  Adenomatous transverse colon polyps Patient sent call back letter for colonoscopy 05/31/2011 and 02/28/2012  Past Medical History  Diagnosis Date  . Gallstone   . Hyperlipidemia   . Hypertension   . Depression   . Anemia     macrocytic  . Asthma   . Menopausal syndrome   . GERD (gastroesophageal reflux disease)   . Alcoholic hepatitis   . Tobacco abuse   . Allergy     rhinitis  . Alcohol abuse, in remission     since around 2005  . Otitis externa, acute Feb 2012    bilateral, treated with azithromycin after failure of neomycin drops  . Otitis media, acute Feb 2012  . Vaginal discharge 2011  . Callus of foot 2009  . Chest pain, musculoskeletal 2011  . Excessive cerumen in ear  canal     since before 2000    Past Surgical History  Procedure Date  . Abdominal hysterectomy   . Cholecystectomy 01/06/09    Prior to Admission medications   Medication Sig Start Date End Date Taking? Authorizing Provider  albuterol (PROVENTIL HFA;VENTOLIN HFA) 108 (90 BASE) MCG/ACT inhaler Inhale 2 puffs into the lungs every 4 (four) hours as needed for wheezing. 03/18/12 03/18/13 Yes Neema Davina Poke, MD  amitriptyline (ELAVIL) 25 MG tablet Take 1 tablet (25 mg total) by mouth at bedtime. 12/31/11  Yes Belia Heman, MD  aspirin 81 MG tablet Take 162 mg by mouth daily.    Yes Historical Provider, MD  fluticasone (FLOVENT HFA) 110 MCG/ACT inhaler Inhale 2 puffs into the lungs 2 (two) times daily. 12/04/11  Yes Farley Ly, MD  hydrochlorothiazide (HYDRODIURIL) 25 MG tablet Take 1 tablet (25 mg total) by mouth daily. 04/18/12  Yes Fnu Nutan, MD  pantoprazole (PROTONIX) 40 MG tablet Take 1 tablet (40 mg total) by mouth daily. 03/21/12  Yes Belia Heman, MD    Scheduled Meds:    . amitriptyline  25 mg Oral QHS  . fluticasone  2 puff Inhalation BID  . pantoprazole (PROTONIX) IV  40 mg Intravenous Q12H  . [COMPLETED] potassium chloride  40 mEq Oral Once  . sodium chloride  3 mL  Intravenous Q12H   Infusions:    . sodium chloride     PRN Meds: sodium chloride, albuterol, ondansetron (ZOFRAN) IV, ondansetron, sodium chloride   Allergies as of 05/08/2012 - Review Complete 05/08/2012  Allergen Reaction Noted  . Banana  02/08/2011  . Grape (artificial) flavor  02/08/2011  . Ibuprofen    . Penicillins      Family History  Problem Relation Age of Onset  . Hypertension Mother     History   Social History  . Marital Status: Single    Spouse Name: N/A    Number of Children: N/A  . Years of Education: N/A   Occupational History  . Not on file.   Social History Main Topics  . Smoking status: Former Smoker    Types: Cigarettes    Quit date: 08/21/1995  . Smokeless  tobacco: Not on file  . Alcohol Use: No     Comment: in remission since 2007  . Drug Use: No  . Sexually Active: No   Other Topics Concern  . Not on file   Social History Narrative   Lives with a friend, not a romantic partner.  2 daughters are involved with her care.     REVIEW OF SYSTEMS: Constitutional:  fatigue ENT:  Per HPI Pulm:  No cough, no SOB CV:  No chest pain, no palps, no pedal edema GU:  Frequency and urgency.  Pees at least 5 times per night GI:  Per HPI MS:  Leg cramps.  Left shoulder pain.  Heme:  Per HPI.    Transfusions:  Per HPI Neuro:  No falls.  No seizures.  No confusion Derm:  No rash, sores, itching Endocrine:  No sweats, no chills.  No excessive thirst Immunization:  Flu shot yesterday Travel:  None beyond 100 mile radius of home   PHYSICAL EXAM: Vital signs in last 24 hours: Temp:  [98 F (36.7 C)-99.2 F (37.3 C)] 99.2 F (37.3 C) (12/12 0700) Pulse Rate:  [82-106] 84  (12/12 0700) Resp:  [16-18] 18  (12/12 0700) BP: (103-146)/(63-76) 106/65 mmHg (12/12 0700) SpO2:  [97 %-100 %] 98 % (12/12 0700) Weight:  [48.081 kg (106 lb)        97#/44 kg 06/2009  General: thin but not cachectic, well appearing AA female.   Head:  No swelling or asymmetry  Eyes:  Conjunctiva pale, no icterus Ears:  Not HOH  Nose:  No discharge or sneezing Mouth:  Full upper denture, no teeth; gums pink, moist and clear Neck:  No thyromegaly, no mass, no bruit, no JVD Lungs:  Clear B. Heart: RRR.  No MRG Abdomen:  Soft, no mass, no HSM, no bruits, no hernias.   Rectal: deferred   Musc/Skeltl: no joint edema or redness Extremities:  No pedal edema.  3 + pedal pulses  Neurologic:  Oriented x 3.  No tremor, no asterixis.  Sits up in bed on her own.  Cooperative, relaxed and follows all commands Skin:  No rash, no sores Tattoos:  None seen Nodes:  No cervical or inguinal adenopathey   Psych:  Pleasant, not agitated or anxious.   Intake/Output from previous  day: 12/11 0701 - 12/12 0700 In: 950 [I.V.:500; Blood:450] Out: 100 [Urine:100] Intake/Output this shift:    LAB RESULTS:  Basename 05/08/12 0147 05/07/12 1433  WBC 7.3 7.4  HGB 6.5* 6.2*  HCT 23.3* 22.8*  PLT 480* 568*  MCV           68.7  BMET Lab Results  Component Value Date   NA 138 05/08/2012   NA 139 05/07/2012   NA 139 02/20/2011   K 3.3* 05/08/2012   K 4.1 05/07/2012   K 3.6 02/20/2011   CL 101 05/08/2012   CL 102 05/07/2012   CL 100 02/20/2011   CO2 26 05/08/2012   CO2 27 05/07/2012   CO2 28 02/20/2011   GLUCOSE 100* 05/08/2012   GLUCOSE 90 05/07/2012   GLUCOSE 82 02/20/2011   BUN 10 05/08/2012   BUN 13 05/07/2012   BUN 7 02/20/2011   CREATININE 0.65 05/08/2012   CREATININE 0.71 05/07/2012   CREATININE 0.71 02/20/2011   CALCIUM 9.4 05/08/2012   CALCIUM 9.5 05/07/2012   CALCIUM 9.3 02/20/2011     Ref. Range 05/08/2012 02:08  Fecal Occult Blood, POC No range found NEGATIVE   LFT  Basename 05/07/12 1433  PROT 7.0  ALBUMIN 4.2  AST 16  ALT <8  ALKPHOS 58  BILITOT 0.3  BILIDIR --  IBILI --   Anemia profile pending.   PT/INR No results found for this basename: INR, PROTIME   Hepatitis Panel No serologies on record  Drugs of Abuse  No results found for this basename: labopia, cocainscrnur, labbenz, amphetmu, thcu, labbarb     RADIOLOGY STUDIES: Dg Chest 2 View 05/08/2012    IMPRESSION: Hyperinflation.  No evidence of active pulmonary disease.   Original Report Authenticated By: Burman Nieves, M.D.     IMPRESSION: *  Anemia. Microcytic.  This may be a sequela or her starch eating "pica".  Rule out low grade GI bleed:  Ulcers, portal hypertension, cancer, AVMs.  *  Thrombocytosis. *  FOB negative though reports formed dark stools at least once in the last 2 to 3 weeks. *  Hx adenomatous colon polyps (2008).  Rule out recurrence, rule out neoplasia. *  Hypokalemia.  *  Urinary frequency without diabetes.    PLAN: *  Colonoscopy and EGD  tomorrow.  3PM.  If these unrevealing, may want to pursue CT of abd/pelvis given the urinary sxs she describes would want to rule out pelvic pathology.  *  Check  PT/INR.  Check TSH *  Lower IVF from 150 to 50 cc/hour *  Change to once daily po Protonix.  Allow clears.    LOS: 0 days   Jennye Moccasin  05/08/2012, 8:35 AM Pager: [phone]  Chart was reviewed and patient was examined. X-rays were reviewed.    I agree with management and plans.  Suspect chronic GI bleeding source.  Pt was taking ASA regularly, has h/o colon polyps.   EGD/colo on 05/09/12  Barbette Hair. Arlyce Dice, M.D., [facility] Cell [phone]

## 2012-05-08 NOTE — H&P (Signed)
Internal Medicine Teaching Service Attending Note Date: 05/08/2012  Patient name: Kimberly [name]  Medical record number: [id number]  Date of birth: 24-Nov-1955   Chief Complaint: Symptomatic anemia . History of Present Illness The patient, Kimberly [name], is a 56 y.o. year old african [place] female who comes in with the chief complaint of fatigue and dizziness and was seen in clinic on 12/11, where her hemoglobin was found to be 6.2 (It was 12.5 1 year back). The patient is on aspirin, and does complain of dark stool occasionally, which she has dismissed in the past attributing them to something she ate. She denies bright red blood per rectum, or any other bleeding. She denies hemoptysis, abdominal pain, N/V, change in bowel habit, chest pain, SOB, and weight loss. She had a colonoscopy in 2008 and had polyps removed. She notes that there is no history of bleeding disorders or colorectal cancer.   In an interesting development, today the patient's daughters told us that the patient has been eating cornstarch (1 box every three days) and ice chips since she was a child and continues to do this even more as an adult. We are not sure if this behavior has increased in the past year when the patient's hemoglobin took a turn for the downward tumble.   Past Medical History   has a past medical history of Gallstone; Hyperlipidemia; Hypertension; Depression; Anemia; Asthma; Menopausal syndrome; GERD (gastroesophageal reflux disease); Alcoholic hepatitis; Tobacco abuse; Allergy; Alcohol abuse, in remission; Otitis externa, acute (Feb 2012); Otitis media, acute (Feb 2012); Vaginal discharge (2011); Callus of foot (2009); Chest pain, musculoskeletal (2011); Excessive cerumen in ear canal; and Cholelithiasis.  Medications  Reviewed  Family History family history includes Hypertension in her mother.  Social History  reports that she quit smoking about 16 years ago. Her smoking use included Cigarettes.  She does not have any smokeless tobacco history on file. She reports that she does not drink alcohol or use illicit drugs. Lives with a girl friend. Has 2 daughters who were present when we visited her.   Review of Systems Positive for - fatigue and dizziness, leg cramps.  Negative for - chest pain, syncope, palpitations, bleeding from various sites.   Vital Signs: Filed Vitals:   05/08/12 0855  BP: 116/71  Pulse: 86  Temp: 98.8 F (37.1 C)  Resp: 18    Physical Exam:  I met with patient around 10:30am today  Vitals reviewed.  General: Resting in bed, thin built. HEENT: PERRL, EOMI, no scleral icterus. Heart: RRR, no rubs, murmurs or gallops. Lungs: Clear to auscultation bilaterally, no wheezes, rales, or rhonchi. Abdomen: Soft, nontender, nondistended, BS present. Extremities: Warm, no pedal edema. Neuro: Alert and oriented X3, cranial nerves II-XII grossly intact,  strength and sensation to light touch equal in bilateral upper and lower extremities  Lab results: CMP     Component Value Date/Time   NA 138 05/08/2012 0147   K 3.3* 05/08/2012 0147   CL 101 05/08/2012 0147   CO2 26 05/08/2012 0147   GLUCOSE 100* 05/08/2012 0147   BUN 10 05/08/2012 0147   CREATININE 0.65 05/08/2012 0147   CREATININE 0.71 05/07/2012 1433   CALCIUM 9.4 05/08/2012 0147   PROT 7.0 05/07/2012 1433   ALBUMIN 4.2 05/07/2012 1433   AST 16 05/07/2012 1433   ALT <8 05/07/2012 1433   ALKPHOS 58 05/07/2012 1433   BILITOT 0.3 05/07/2012 1433   GFRNONAA >90 05/08/2012 0147   GFRAA >90 05/08/2012 0147  CBC    Component Value Date/Time   WBC 7.3 05/08/2012 0147   RBC 3.39* 05/08/2012 0147   HGB 6.5* 05/08/2012 0147   HCT 23.3* 05/08/2012 0147   PLT 480* 05/08/2012 0147   MCV 68.7* 05/08/2012 0147   MCH 19.2* 05/08/2012 0147   MCHC 27.9* 05/08/2012 0147   RDW 15.7* 05/08/2012 0147   LYMPHSABS 0.7 05/08/2012 0147   MONOABS 0.2 05/08/2012 0147   EOSABS 0.0 05/08/2012 0147   EOSABS 0.0  K/UL 03/08/2006 1121   BASOSABS 0.1 05/08/2012 0147    Urinalysis    Component Value Date/Time   COLORURINE YELLOW 05/08/2012 0235   APPEARANCEUR CLEAR 05/08/2012 0235   LABSPEC 1.014 05/08/2012 0235   PHURINE 5.5 05/08/2012 0235   GLUCOSEU NEGATIVE 05/08/2012 0235   HGBUR NEGATIVE 05/08/2012 0235   BILIRUBINUR NEGATIVE 05/08/2012 0235   KETONESUR NEGATIVE 05/08/2012 0235   PROTEINUR NEGATIVE 05/08/2012 0235   UROBILINOGEN 0.2 05/08/2012 0235   NITRITE NEGATIVE 05/08/2012 0235   LEUKOCYTESUR TRACE* 05/08/2012 0235   Iron/TIBC/Ferritin    Component Value Date/Time   IRON <10* 05/08/2012 0210   TIBC Not calculated due to Iron <10. 05/08/2012 0210   FERRITIN 4* 05/08/2012 0210     Imaging results:  Dg Chest 2 View  05/08/2012  *RADIOLOGY REPORT*  Clinical Data: Cough.  Anemia.  Tachycardia.  CHEST - 2 VIEW  Comparison: 07/16/2010  Findings: Hyperinflation. The heart size and pulmonary vascularity are normal. The lungs appear clear and expanded without focal air space disease or consolidation. No blunting of the costophrenic angles. No pneumothorax.  Mediastinal contours appear intact.  No significant change since previous study.  IMPRESSION: Hyperinflation.  No evidence of active pulmonary disease.   Original Report Authenticated By: Burman Nieves, M.D.     Assessment and Plan: 1. Symptomatic Anemia: Transfused 2 units, awaiting post transfusion results. Patient feels better. Anemia work up has been done. Possible causes in my mind:  A) Blood loss anemia due to GI related causes given PICA and use of Aspirin and the history of dark stools. GI has been consulted and they will do an EGD and Colonoscopy in this admission. On protonix.  B) Iron deficiency given the extremely low ferritin and serum iron, low MCV, and a low reticulocyte count. We will supplement with iron. I am not sure though as to the etiology of this sudden iron deficiency over the past year (is she suddenly not  absorbing iron?, diet change?), and if this does not correct, or if we do not find any GI sources then I would like the patient to follow up with hematology for a more complete work up as outpatient. Iron supplementation upon discharge.  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  3. Other chronic issues per resident note.   Thanks, Aletta Edouard, MD 12/12/20132:44 PM

## 2012-05-08 NOTE — H&P (Signed)
Hospital Admission Note Date: 05/08/2012  Patient name: Kimberly [name] Medical record number: [id number] Date of birth: 09/25/1955 Age: 56 y.o. Gender: female PCP: Vernice Jefferson, MD  Medical Service: Internal Medicine Teaching Service  Attending physician:  Dr. Aundria Rud   1st Contact:  Dr. Burtis Junes   [phone] 2nd Contact:  Dr. Manson Passey   Pager:[phone] After 5 pm or weekends: 1st Contact:      Pager: [phone] 2nd Contact:      Pager: [phone]  Chief Complaint: fatigue, anemia  History of Present Illness:  Ms. Kawabata is a 56 year old woman with PMH of HLP, HTN, asthma, and GERD who presents to the ED for dizziness and fatigue.  She was seen in clinic on 12/11 and was found to have Hb 6.2.  She is very vague with her history. She states that she has been feeling fatigued for a while and that occasionally she has "dark stools" but she attributes her dark stool to certain foods she eats. She also complains of of occasional leg cramps and feeling cold in her extremities. He had URI symptoms over Thanksgiving but they have now subsided.She has left shoulder pain that has been present for days. She denies any bright red blood per rectum, hemoptysis, abdominal pain, N/V, change in bowel habit, chest pain, SOB, weight loss, hematuria, or vaginal bleeding . She has been taking ASA 81mg  three times per day. She had a colonoscopy in 2008 and had polyps removed. She notes that there is no history of bleeding disorders or colorectal cancer.    Meds: Current Outpatient Rx  Name  Route  Sig  Dispense  Refill  . ALBUTEROL SULFATE HFA 108 (90 BASE) MCG/ACT IN AERS   Inhalation   Inhale 2 puffs into the lungs every 4 (four) hours as needed for wheezing.   1 Inhaler   3   . AMITRIPTYLINE HCL 25 MG PO TABS   Oral   Take 1 tablet (25 mg total) by mouth at bedtime.   30 tablet   5   . ASPIRIN 81 MG PO TABS   Oral   Take 162 mg by mouth daily.          Marland Kitchen FLUTICASONE PROPIONATE  HFA 110 MCG/ACT IN  AERO   Inhalation   Inhale 2 puffs into the lungs 2 (two) times daily.   3 Inhaler   1   . HYDROCHLOROTHIAZIDE 25 MG PO TABS   Oral   Take 1 tablet (25 mg total) by mouth daily.   90 tablet   1   . PANTOPRAZOLE SODIUM 40 MG PO TBEC   Oral   Take 1 tablet (40 mg total) by mouth daily.   90 tablet   0     Allergies: Allergies as of 05/08/2012 - Review Complete 05/08/2012  Allergen Reaction Noted  . Banana  02/08/2011  . Grape (artificial) flavor  02/08/2011  . Ibuprofen    . Penicillins     Past Medical History  Diagnosis Date  . Gallstone   . Hyperlipidemia   . Hypertension   . Depression   . Anemia     macrocytic  . Asthma   . Menopausal syndrome   . GERD (gastroesophageal reflux disease)   . Alcoholic hepatitis   . Tobacco abuse   . Allergy     rhinitis  . Alcohol abuse, in remission     since around 2005  . Otitis externa, acute Feb 2012    bilateral, treated with azithromycin  after failure of neomycin drops  . Otitis media, acute Feb 2012  . Vaginal discharge 2011  . Callus of foot 2009  . Chest pain, musculoskeletal 2011  . Excessive cerumen in ear canal     since before 2000   Past Surgical History  Procedure Date  . Abdominal hysterectomy   . Cholecystectomy 01/06/09   Family History  Problem Relation Age of Onset  . Hypertension Mother    History   Social History  . Marital Status: Single    Spouse Name: N/A    Number of Children: N/A  . Years of Education: N/A   Occupational History  . Not on file.   Social History Main Topics  . Smoking status: Former Smoker    Types: Cigarettes    Quit date: 08/21/1995  . Smokeless tobacco: Not on file  . Alcohol Use: No     Comment: in remission since 2007  . Drug Use: No  . Sexually Active: No   Other Topics Concern  . Not on file   Social History Narrative   2    Review of Systems: Constitutional: Denies fever, chills, diaphoresis, appetite change and + fatigue.  HEENT: Denies  photophobia, eye pain, redness, hearing loss, ear pain, congestion, sore throat, rhinorrhea, sneezing, mouth sores, trouble swallowing, neck pain, neck stiffness and tinnitus.  Respiratory: Denies SOB, DOE, cough, chest tightness, and wheezing.  Cardiovascular: Denies chest pain, palpitations and leg swelling.  Gastrointestinal: Denies nausea, vomiting, abdominal pain, diarrhea, constipation, blood in stool and abdominal distention.  Genitourinary: Denies dysuria, urgency, frequency, hematuria, flank pain and difficulty urinating. +burning sensation Musculoskeletal: Left shoulder pain. Denies myalgias, back pain, joint swelling, arthralgias and gait problem.  Skin: Denies pallor, rash and wound.  Neurological: Denies dizziness, seizures, syncope, weakness, +light-headedness, numbness and headaches.  Hematological: Denies adenopathy. Easy bruising, personal or family bleeding history  Psychiatric/Behavioral: Denies suicidal ideation, mood changes, confusion, nervousness, sleep disturbance and agitation  Physical Exam: Blood pressure 115/63, pulse 86, temperature 98.1 F (36.7 C), temperature source Oral, resp. rate 16, height 5\' 4"  (1.626 m), weight 106 lb (48.081 kg), last menstrual period 08/21/1982, SpO2 100.00%. General: alert, well-developed, and cooperative to examination. Very thin with facial pallor.  Head: normocephalic and atraumatic.  Eyes: vision grossly intact, pupils equal, pupils round, pupils reactive to light, no injection and anicteric.  Mouth: pharynx pink and moist, no erythema, and no exudates.  Neck: supple, full ROM, no thyromegaly, no JVD.  Lungs: normal respiratory effort, no accessory muscle use, normal breath sounds, no crackles, and no wheezes. Heart: normal rate, regular rhythm, no murmur, no gallop, and no rub.  Abdomen: soft, non-tender, normal bowel sounds, no distention, no guarding, no rebound tenderness, no hepatomegaly, and no splenomegaly.  Msk: Left shoulder  tender to touch over the scapular region.  Pulses: 2+ DP/PT pulses bilaterally Extremities: No cyanosis, clubbing, or edema. Cold feet.  Neurologic: alert & oriented X3, cranial nerves II-XII intact, strength normal in all extremities, sensation intact to light touch, and gait normal.  Skin: turgor normal and no rashes.  Psych: Somewhat guarded, but normally interactive, good eye contact, not anxious appearing, and not depressed appearing, she smiles at times.   Lab results: Basic Metabolic Panel:  Basename 05/08/12 0147 05/07/12 1433  NA 138 139  K 3.3* 4.1  CL 101 102  CO2 26 27  GLUCOSE 100* 90  BUN 10 13  CREATININE 0.65 0.71  CALCIUM 9.4 9.5  MG -- --  PHOS -- --  Liver Function Tests:  [facility] 05/07/12 1433  AST 16  ALT <8  ALKPHOS 58  BILITOT 0.3  PROT 7.0  ALBUMIN 4.2   CBC:  Basename 05/08/12 0147 05/07/12 1433  WBC 7.3 7.4  NEUTROABS 6.3 5.1  HGB 6.5* 6.2*  HCT 23.3* 22.8*  MCV 68.7* 67.7*  PLT 480* 568*   Fasting Lipid Panel:  Basename 05/07/12 1433  CHOL 165  HDL 60  LDLCALC 82  TRIG 114  CHOLHDL 2.8  LDLDIRECT --   Anemia Panel:  Basename 05/08/12 0210  VITAMINB12 --  FOLATE --  FERRITIN --  TIBC --  IRON --  RETICCTPCT 1.0   Urinalysis:  Basename 05/08/12 0235  COLORURINE YELLOW  LABSPEC 1.014  PHURINE 5.5  GLUCOSEU NEGATIVE  HGBUR NEGATIVE  BILIRUBINUR NEGATIVE  KETONESUR NEGATIVE  PROTEINUR NEGATIVE  UROBILINOGEN 0.2  NITRITE NEGATIVE  LEUKOCYTESUR TRACE*   Assessment & Plan by Problem: Active Problems:  1. Microcytic Anemia: Unclear etiology. She has been taking ASA 81mg  three times daily and PUD is definitely a possibility. Lower GI also possible, with possible history of melena with dark stools reported. Her FOBT is negative, however. Hemorrhoidal bleed is also a possibility but unlikely given her lack of straining or constipation. She has had colonic polyps in 2008, adenomatous polyps with no high grade dysplasia.  Her FOBT is negative.  -Admit to telemetry -Type and Crossed -Transfuse 2 units of pRBC -CBC q6hr -Check orthostatic vitals -GI consult in AM (Dr. Gerilyn Pilgrim, Corinda Gubler) -Keep NPO -Anemia panel pending -Protonix IV BID -f/u anemia panel  2. HTN: stable, will hold hypertensive meds in setting of anemia/GI bleed.  3.Possible UTI: Trace leukocytes on UA.  She complains of vague, occasional burning with urination but denies dysuria. Will not treat for UTI at this time, will await result of urine culture.  -Urine culture pending.  4. Hypokalemia: K 3.3.  Likely due to diuretics. Will check Mg level -Replete with KDur po x1  Dispo: Disposition is deferred at this time, awaiting improvement of current medical problems. Anticipated discharge in approximately 1-2 day(s).   The patient does have a current PCP (KAPADIA, NEEMA, MD), therefore will not be requiring OPC follow-up after discharge.   The patient does not have transportation limitations that hinder transportation to clinic appointments.  Signed: HO,MICHELE 05/08/2012, 3:04 AM

## 2012-05-08 NOTE — ED Notes (Signed)
The patient states that she has been experiencing general weakness and dizziness intermittently since last week.  Last night she was advised that her recent blood work revealed that she has a low hemoglobin.  The patient has a history of htn, asthma, and anemia.

## 2012-05-08 NOTE — Progress Notes (Signed)
Pt transferred from the ED around 0530, admitted to Rm 6709. Pt comes from home with family. She is alert and oriented. Ambulatory with standby assist. No skin breakdown noted. Placed on telemetry, currently running NSR. Blood transfusing, no s/s of reaction. Oriented to room, instructed to call for assistance before getting out of bed. Called pt daughter/Linda per her request and informed her what room she is inResting comfortably at this time, will continue to monitor.

## 2012-05-08 NOTE — ED Provider Notes (Addendum)
History     CSN: [id number]  Arrival date & time 05/08/12  0102   First MD Initiated Contact with Patient 05/08/12 0154      Chief Complaint  Patient presents with  . Weakness  . Dizziness  . Anemia    (Consider location/radiation/quality/duration/timing/severity/associated sxs/prior treatment) Patient is a 56 y.o. female presenting with weakness and anemia. The history is provided by the patient.  Weakness  Additional symptoms include weakness.  Anemia  She was sent to the emergency department by her PCP who told her that her hemoglobin was low. She had a routine office visit yesterday and blood was drawn. In feeling intermittently weak and dizzy for the last one to 2 weeks. She denies chest pain, heaviness, tightness, pressure. She denies nausea vomiting. She denies NSAIDs. She does take aspirin 81 mg once a day. Store and has been mostly normal color although occasionally dark. There's been no gross melena.  Past Medical History  Diagnosis Date  . Gallstone   . Hyperlipidemia   . Hypertension   . Depression   . Anemia     macrocytic  . Asthma   . Menopausal syndrome   . GERD (gastroesophageal reflux disease)   . Alcoholic hepatitis   . Tobacco abuse   . Allergy     rhinitis  . Alcohol abuse, in remission     since around 2005  . Otitis externa, acute Feb 2012    bilateral, treated with azithromycin after failure of neomycin drops  . Otitis media, acute Feb 2012  . Vaginal discharge 2011  . Callus of foot 2009  . Chest pain, musculoskeletal 2011  . Excessive cerumen in ear canal     since before 2000    Past Surgical History  Procedure Date  . Abdominal hysterectomy   . Cholecystectomy 01/06/09    Family History  Problem Relation Age of Onset  . Hypertension Mother     History  Substance Use Topics  . Smoking status: Former Smoker    Types: Cigarettes    Quit date: 08/21/1995  . Smokeless tobacco: Not on file  . Alcohol Use: No     Comment: in  remission since 2007    OB History    Grav Para Term Preterm Abortions TAB SAB Ect Mult Living                  Review of Systems  Neurological: Positive for weakness.  All other systems reviewed and are negative.    Allergies  Banana; Grape (artificial) flavor; Ibuprofen; and Penicillins  Home Medications   Current Outpatient Rx  Name  Route  Sig  Dispense  Refill  . ALBUTEROL SULFATE HFA 108 (90 BASE) MCG/ACT IN AERS   Inhalation   Inhale 2 puffs into the lungs every 4 (four) hours as needed for wheezing.   1 Inhaler   3   . AMITRIPTYLINE HCL 25 MG PO TABS   Oral   Take 1 tablet (25 mg total) by mouth at bedtime.   30 tablet   5   . ASPIRIN 81 MG PO TABS   Oral   Take 162 mg by mouth daily.          Marland Kitchen FLUTICASONE PROPIONATE  HFA 110 MCG/ACT IN AERO   Inhalation   Inhale 2 puffs into the lungs 2 (two) times daily.   3 Inhaler   1   . HYDROCHLOROTHIAZIDE 25 MG PO TABS   Oral   Take 1 tablet (  25 mg total) by mouth daily.   90 tablet   1   . PANTOPRAZOLE SODIUM 40 MG PO TBEC   Oral   Take 1 tablet (40 mg total) by mouth daily.   90 tablet   0     BP 115/63  Pulse 86  Temp 98.1 F (36.7 C) (Oral)  Resp 16  Ht 5\' 4"  (1.626 m)  Wt 106 lb (48.081 kg)  BMI 18.19 kg/m2  SpO2 100%  LMP 08/21/1982  Physical Exam  Nursing note and vitals reviewed. 56 year old female, resting comfortably and in no acute distress. Vital signs are normal. Oxygen saturation is 100%, which is normal. Head is normocephalic and atraumatic. PERRLA, EOMI. Oropharynx is clear. Conjunctivae are pale. Neck is nontender and supple without adenopathy or JVD. Back is nontender and there is no CVA tenderness. Lungs are clear without rales, wheezes, or rhonchi. Chest is nontender. Heart has regular rate and rhythm without murmur. Abdomen is soft, flat, nontender without masses or hepatosplenomegaly and peristalsis is normoactive. Rectal: Normal sphincter tone, small amount of  stool of normal color which is sent for Hemoccult testing. Extremities have no cyanosis or edema, full range of motion is present. Skin is warm and dry without rash. Neurologic: Mental status is normal, cranial nerves are intact, there are no motor or sensory deficits.   ED Course  Procedures (including critical care time)  Results for orders placed during the hospital encounter of 05/08/12  CBC WITH DIFFERENTIAL      Component Value Range   WBC 7.3  4.0 - 10.5 K/uL   RBC 3.39 (*) 3.87 - 5.11 MIL/uL   Hemoglobin 6.5 (*) 12.0 - 15.0 g/dL   HCT 16.1 (*) 09.6 - 04.5 %   MCV 68.7 (*) 78.0 - 100.0 fL   MCH 19.2 (*) 26.0 - 34.0 pg   MCHC 27.9 (*) 30.0 - 36.0 g/dL   RDW 40.9 (*) 81.1 - 91.4 %   Platelets 480 (*) 150 - 400 K/uL   Neutrophils Relative PENDING  43 - 77 %   Neutro Abs PENDING  1.7 - 7.7 K/uL   Band Neutrophils PENDING  0 - 10 %   Lymphocytes Relative PENDING  12 - 46 %   Lymphs Abs PENDING  0.7 - 4.0 K/uL   Monocytes Relative PENDING  3 - 12 %   Monocytes Absolute PENDING  0.1 - 1.0 K/uL   Eosinophils Relative PENDING  0 - 5 %   Eosinophils Absolute PENDING  0.0 - 0.7 K/uL   Basophils Relative PENDING  0 - 1 %   Basophils Absolute PENDING  0.0 - 0.1 K/uL   WBC Morphology PENDING     RBC Morphology PENDING     Smear Review PENDING     nRBC PENDING  0 /100 WBC   Metamyelocytes Relative PENDING     Myelocytes PENDING     Promyelocytes Absolute PENDING     Blasts PENDING    BASIC METABOLIC PANEL      Component Value Range   Sodium 138  135 - 145 mEq/L   Potassium 3.3 (*) 3.5 - 5.1 mEq/L   Chloride 101  96 - 112 mEq/L   CO2 26  19 - 32 mEq/L   Glucose, Bld 100 (*) 70 - 99 mg/dL   BUN 10  6 - 23 mg/dL   Creatinine, Ser 7.82  0.50 - 1.10 mg/dL   Calcium 9.4  8.4 - 95.6 mg/dL   GFR calc  non Af Amer >90  >90 mL/min   GFR calc Af Amer >90  >90 mL/min  TYPE AND SCREEN      Component Value Range   ABO/RH(D) O POS     Antibody Screen PENDING     Sample Expiration  05/11/2012    RETICULOCYTES      Component Value Range   Retic Ct Pct 1.0  0.4 - 3.1 %   RBC. 3.30 (*) 3.87 - 5.11 MIL/uL   Retic Count, Manual 33.0  19.0 - 186.0 K/uL  OCCULT BLOOD, POC DEVICE      Component Value Range   Fecal Occult Bld NEGATIVE    ABO/RH      Component Value Range   ABO/RH(D) O POS     No results found.    1. Microcytic anemia   2. Weakness       MDM  Microcytic anemia which seems most likely to be due 2 iron deficiency and blood loss. Stool Hemoccult is negative. Anemia panel is drawn and she will need a transfusion. Old records are reviewed, and hemoglobin was 6.2 drawn yesterday, and last hemoglobin was in February 2012 and was 12.5.  Mrs. anemia panel is pending, but reticulocyte count has come back at 1% which is very low considering how low her hemoglobin is. She will be admitted under observation status for transfusion pending results of her anemia panel. Case has been discussed with Dr. Anselm Jungling of internal medicine service who agrees to admit the patient.     Dione Booze, MD 05/08/12 1610  Dione Booze, MD 05/08/12 [phone]

## 2012-05-08 NOTE — Progress Notes (Addendum)
Subjective: She has been taking Asa 81 mg x 3 pills for 3-4 years.  She has been having epigastric pain.  She denies BRBPR.  She has been straining to have a bowel movements.  She denies FH colon cancer.  She said she was dizzy on admission but denies dizziness today.  She denies chest pain or shortness of breath. Patient is eating cornstarch since a child and also eating ice.     Objective: Vital signs in last 24 hours: Filed Vitals:   05/08/12 0415 05/08/12 0510 05/08/12 0600 05/08/12 0700  BP:  107/65 103/67 106/65  Pulse:  83 82 84  Temp: 98.1 F (36.7 C) 98.7 F (37.1 C) 98.4 F (36.9 C) 99.2 F (37.3 C)  TempSrc: Oral Oral Oral Oral  Resp: 18 18 18 18   Height:      Weight:   106 lb (48.081 kg)   SpO2:   97% 98%   Weight change:   Intake/Output Summary (Last 24 hours) at 05/08/12 0718 Last data filed at 05/08/12 0700  Gross per 24 hour  Intake    950 ml  Output    100 ml  Net    850 ml   General: lying in bed, nad, alert and oriented x 3 HEENT: [place]/at CV: RRR no rubs, murmurs, or gallops Lungs: ctab Abdomen: soft,ntnd, +normal bs Extremities: no cyanosis or edema, warm Neuro: CN 2-12 grossly intact, moving all 4 extremities.   Lab Results: Basic Metabolic Panel:  Lab 05/08/12 0272 05/07/12 1433  NA 138 139  K 3.3* 4.1  CL 101 102  CO2 26 27  GLUCOSE 100* 90  BUN 10 13  CREATININE 0.65 0.71  CALCIUM 9.4 9.5  MG 2.2 --  PHOS -- --   Liver Function Tests:  Lab 05/07/12 1433  AST 16  ALT <8  ALKPHOS 58  BILITOT 0.3  PROT 7.0  ALBUMIN 4.2   CBC:  Lab 05/08/12 0147 05/07/12 1433  WBC 7.3 7.4  NEUTROABS 6.3 5.1  HGB 6.5* 6.2*  HCT 23.3* 22.8*  MCV 68.7* 67.7*  PLT 480* 568*   Fasting Lipid Panel:  Lab 05/07/12 1433  CHOL 165  HDL 60  LDLCALC 82  TRIG 114  CHOLHDL 2.8  LDLDIRECT --   Anemia Panel:  Lab 05/08/12 0210  VITAMINB12 --  FOLATE --  FERRITIN --  TIBC --  IRON --  RETICCTPCT 1.0   Urinalysis:  Lab 05/08/12 0235   COLORURINE YELLOW  LABSPEC 1.014  PHURINE 5.5  GLUCOSEU NEGATIVE  HGBUR NEGATIVE  BILIRUBINUR NEGATIVE  KETONESUR NEGATIVE  PROTEINUR NEGATIVE  UROBILINOGEN 0.2  NITRITE NEGATIVE  LEUKOCYTESUR TRACE*   Misc. Labs:none  Studies/Results: Dg Chest 2 View  05/08/2012  *RADIOLOGY REPORT*  Clinical Data: Cough.  Anemia.  Tachycardia.  CHEST - 2 VIEW  Comparison: 07/16/2010  Findings: Hyperinflation. The heart size and pulmonary vascularity are normal. The lungs appear clear and expanded without focal air space disease or consolidation. No blunting of the costophrenic angles. No pneumothorax.  Mediastinal contours appear intact.  No significant change since previous study.  IMPRESSION: Hyperinflation.  No evidence of active pulmonary disease.   Original Report Authenticated By: Burman Nieves, M.D.    Medications  Scheduled Meds:   . amitriptyline  25 mg Oral QHS  . fluticasone  2 puff Inhalation BID  . pantoprazole (PROTONIX) IV  40 mg Intravenous Q12H  . [COMPLETED] potassium chloride  40 mEq Oral Once  . sodium chloride  3 mL Intravenous  Q12H   Continuous Infusions:  PRN Meds:.sodium chloride, albuterol, ondansetron (ZOFRAN) IV, ondansetron, sodium chloride Assessment/Plan: 56 y.o presented to the ED with dizziness and fatigue seen in clinic 12/11 found to have a hemoglobin of 6.2 and occasional black tarry stools at home.  She has been taking ASA 81 mg tid.  She had a colonoscopy in 2008 and had polyps removed.  She denies family history of colorectal cancer.   1. Microcytic Anemia:  Unclear etiology likely GI source. She was taking ASA 81mg  three times daily x 3-4 years and PUD is definitely a possibility. Upper GI also possible, with possible history of melena with dark stools reported. Her FOBT is negative, however 05/08/12.  She has had colonic polyps in 2008, adenomatous polyps with no high grade dysplasia.Hbg 6.5/23.3 with MCV 68.7.   Plan -Transfusing 2 units of pRBC   -CBC q6hr after post transfusion cbc -Check orthostatic vitals pending  -GI consult with Dr. Arlyce Dice -Keep NPO  -Anemia panel pending  -Protonix IV BID   2. Hypertension:  -stable, will hold hypertensive meds in setting of anemia/GI bleed.   3.Possible UTI:  Trace leukocytes on UA. She complains of vague, occasional burning with urination but denies dysuria. Will not treat for UTI at this time.  -Urine culture pending.   4. Hypokalemia:  K 3.3. Likely due to diuretics. Mag 2.2 normal -Repleted with KDur po x1  5. F/E/N -NS 150 cc/hr  -will monitor electrolytes -NPO   Dispo: Disposition is deferred at this time, awaiting improvement of current medical problems.  Anticipated discharge in approximately 2-3day(s).   The patient does have a current PCP (KAPADIA, NEEMA, MD), therefore will be equiring OPC follow-up after discharge.   The patient does not have transportation limitations that hinder transportation to clinic appointments.  .Services Needed at time of discharge: Y = Yes, Blank = No PT:   OT:   RN:   Equipment:   Other:     LOS: 0 days   Annett Gula [phone] 05/08/2012, 7:18 AM

## 2012-05-09 ENCOUNTER — Encounter (HOSPITAL_COMMUNITY)
Admission: EM | Disposition: A | Discharge status: Home / Self Care | Payer: Self-pay | Source: Home / Self Care | Attending: Internal Medicine

## 2012-05-09 ENCOUNTER — Encounter (HOSPITAL_COMMUNITY): Payer: Self-pay | Admitting: *Deleted

## 2012-05-09 DIAGNOSIS — R42 Dizziness and giddiness: Secondary | ICD-10-CM | POA: Diagnosis present

## 2012-05-09 DIAGNOSIS — K31819 Angiodysplasia of stomach and duodenum without bleeding: Secondary | ICD-10-CM | POA: Diagnosis present

## 2012-05-09 HISTORY — PX: ESOPHAGOGASTRODUODENOSCOPY: SHX5428

## 2012-05-09 HISTORY — PX: COLONOSCOPY: SHX5424

## 2012-05-09 LAB — COMPREHENSIVE METABOLIC PANEL
ALT: 6 U/L (ref 0–35)
AST: 16 U/L (ref 0–37)
CO2: 23 mEq/L (ref 19–32)
Chloride: 106 mEq/L (ref 96–112)
GFR calc non Af Amer: >90 mL/min (ref >90)
Sodium: 140 mEq/L (ref 135–145)
Total Bilirubin: 1.2 mg/dL (ref 0.3–1.2)

## 2012-05-09 LAB — CBC WITH DIFFERENTIAL/PLATELET
Basophils Absolute: 0.1 10*3/uL (ref 0.0–0.1)
Basophils Relative: 2 % — ABNORMAL HIGH (ref 0–1)
Basophils Relative: 2 % — ABNORMAL HIGH (ref 0–1)
Eosinophils Absolute: 0.2 10*3/uL (ref 0.0–0.7)
Eosinophils Absolute: 0.2 10*3/uL (ref 0.0–0.7)
Eosinophils Relative: 4 % (ref 0–5)
Lymphocytes Relative: 32 % (ref 12–46)
MCH: 22.1 pg — ABNORMAL LOW (ref 26.0–34.0)
MCHC: 29.9 g/dL — ABNORMAL LOW (ref 30.0–36.0)
MCHC: 30.4 g/dL (ref 30.0–36.0)
MCV: 73.7 fL — ABNORMAL LOW (ref 78.0–100.0)
Monocytes Absolute: 0.3 10*3/uL (ref 0.1–1.0)
Neutrophils Relative %: 53 % (ref 43–77)
Platelets: 440 10*3/uL — ABNORMAL HIGH (ref 150–400)
Platelets: 397 10*3/uL (ref 150–400)
RDW: 18.6 % — ABNORMAL HIGH (ref 11.5–15.5)
WBC: 5.2 10*3/uL (ref 4.0–10.5)

## 2012-05-09 LAB — TYPE AND SCREEN: Unit division: 0

## 2012-05-09 LAB — CBC
Platelets: 442 10*3/uL — ABNORMAL HIGH (ref 150–400)
RBC: 3.99 MIL/uL (ref 3.87–5.11)
WBC: 4.2 10*3/uL (ref 4.0–10.5)

## 2012-05-09 LAB — URINE CULTURE: Colony Count: 40000

## 2012-05-09 SURGERY — COLONOSCOPY
Anesthesia: Moderate Sedation

## 2012-05-09 MED ORDER — MIDAZOLAM HCL 5 MG/5ML IJ SOLN
INTRAMUSCULAR | Status: DC | PRN
  Administered 2012-05-09 (×4): 2 mg via INTRAVENOUS

## 2012-05-09 MED ORDER — MIDAZOLAM HCL 5 MG/ML IJ SOLN
INTRAMUSCULAR | Status: AC
  Filled 2012-05-09: qty 3

## 2012-05-09 MED ORDER — FENTANYL CITRATE 0.05 MG/ML IJ SOLN
INTRAMUSCULAR | Status: AC
  Filled 2012-05-09: qty 4

## 2012-05-09 MED ORDER — DIPHENHYDRAMINE HCL 50 MG/ML IJ SOLN
INTRAMUSCULAR | Status: DC | PRN
  Administered 2012-05-09 (×2): 25 mg via INTRAVENOUS

## 2012-05-09 MED ORDER — DIPHENHYDRAMINE HCL 50 MG/ML IJ SOLN
INTRAMUSCULAR | Status: AC
  Filled 2012-05-09: qty 1

## 2012-05-09 MED ORDER — FENTANYL CITRATE 0.05 MG/ML IJ SOLN
INTRAMUSCULAR | Status: DC | PRN
  Administered 2012-05-09 (×3): 25 ug via INTRAVENOUS

## 2012-05-09 NOTE — Op Note (Signed)
[facility] [address] [place], [address]   ENDOSCOPY PROCEDURE REPORT  PATIENT: Kimberly [name], Kimberly [name]  MR#: [id number] BIRTHDATE: 11/21/1955 , 56  yrs. old GENDER: Female ENDOSCOPIST: Louis Meckel, MD REFERRED BY:  Gary Fleet, M.D. PROCEDURE DATE:  05/09/2012 PROCEDURE:  EGD w/ control of bleeding ASA CLASS:     Class II INDICATIONS:  Iron deficiency anemia. MEDICATIONS: There was residual sedation effect present from prior procedure. TOPICAL ANESTHETIC: Cetacaine Spray  DESCRIPTION OF PROCEDURE: After the risks benefits and alternatives of the procedure were thoroughly explained, informed consent was obtained.  The Pentax Gastroscope [id number] endoscope was introduced through the mouth and advanced to the third portion of the duodenum. Without limitations.  The instrument was slowly withdrawn as the mucosa was fully examined.      In the second portion of the duodenum there was a nonbleeding 2-3 mm AVM.  A second AVM was seen in the third portion the duodenum measuring about 1 mm.  Each was cauterized utilizing the argon plasma coagulator.  The larger AVM initially began bleeding moderately severely with the initial application of the APC. Hemostasis was eventually achieved.   The remainder of the upper endoscopy exam was otherwise normal.  Retroflexed views revealed no abnormalities.     The scope was then withdrawn from the patient and the procedure completed.  COMPLICATIONS: There were no complications. ENDOSCOPIC IMPRESSION: 1.  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  iron supplementation 2.  continue PPI therapy for one month REPEAT EXAM:  eSigned:  Louis Meckel, MD 05/09/2012 6:08 PM   CC:

## 2012-05-09 NOTE — Progress Notes (Signed)
Internal Medicine Clinic appt 05/15/12 3:15 PM Dr. Cherlyn Labella MD

## 2012-05-09 NOTE — Op Note (Signed)
[facility] [address] [place], [address]   COLONOSCOPY PROCEDURE REPORT  PATIENT: Kimberly [name], Kimberly [name]  MR#: [id number] BIRTHDATE: Oct 25, 1955 , 56  yrs. old GENDER: Female ENDOSCOPIST: Louis Meckel, MD REFERRED XB:[id number] Eliezer Lofts, M.D. PROCEDURE DATE:  05/09/2012 PROCEDURE:   Colonoscopy, diagnostic ASA CLASS:   Class II INDICATIONS:Iron Deficiency Anemia. MEDICATIONS: These medications were titrated to patient response per physician's verbal order, Versed 8 mg IV, and Fentanyl 75 mcg IV  DESCRIPTION OF PROCEDURE:   After the risks benefits and alternatives of the procedure were thoroughly explained, informed consent was obtained.  A digital rectal exam revealed no abnormalities of the rectum.   The     endoscope was introduced through the anus and advanced to the cecum, which was identified by both the appendix and ileocecal valve. No adverse events experienced.   The quality of the prep was excellent, using MiraLax The instrument was then slowly withdrawn as the colon was fully examined.      COLON FINDINGS: A normal appearing cecum, ileocecal valve, and appendiceal orifice were identified.  The ascending, hepatic flexure, transverse, splenic flexure, descending, sigmoid colon and rectum appeared unremarkable.  No polyps or cancers were seen. Retroflexed views revealed no abnormalities. The time to cecum=  . Withdrawal time=8 minutes 0 seconds.  The scope was withdrawn and the procedure completed. COMPLICATIONS: There were no complications.  ENDOSCOPIC IMPRESSION: Normal colon  RECOMMENDATIONS: Upper endoscopy   eSigned:  Louis Meckel, MD 05/09/2012 6:04 PM   cc:

## 2012-05-09 NOTE — Interval H&P Note (Signed)
History and Physical Interval Note:  05/09/2012 5:15 PM  [name]  has presented today for surgery, with the diagnosis of anemia. Hx colon polyps  The various methods of treatment have been discussed with the patient and family. After consideration of risks, benefits and other options for treatment, the patient has consented to  Procedure(s) (LRB) with comments: COLONOSCOPY (N/A) ESOPHAGOGASTRODUODENOSCOPY (EGD) (N/A) as a surgical intervention .  The patient's history has been reviewed, patient examined, no change in status, stable for surgery.  I have reviewed the patient's chart and labs.  Questions were answered to the patient's satisfaction.     The recent H&P (dated *05/08/12**) was reviewed, the patient was examined and there is no change in the patients condition since that H&P was completed.   Melvia Heaps  05/09/2012, 5:15 PM   Melvia Heaps

## 2012-05-09 NOTE — Progress Notes (Addendum)
Subjective:  Patient denies concerns today. She has not seen blood in her liquid bowel movements.  She states she has had increased intake of cornstarch recently but cant saw w/in a time span.  She could eat 1 box per day.  She denies chest pain, abdominal pain.  She promises not to eat cornstarch any longer only ice.    Objective: Vital signs in last 24 hours: Filed Vitals:   05/08/12 1753 05/08/12 2148 05/09/12 0531 05/09/12 1024  BP: 165/72 98/53 97/55  124/70  Pulse: 80 80 78 75  Temp: 98.7 F (37.1 C) 98.8 F (37.1 C) 98.6 F (37 C) 97.7 F (36.5 C)  TempSrc: Oral Oral Oral Oral  Resp: 18 18 16 18   Height:      Weight:  110 lb 14.4 oz (50.304 kg)    SpO2: 98% 100% 100% 100%   Weight change: 4 lb 14.4 oz (2.223 kg)  Intake/Output Summary (Last 24 hours) at 05/09/12 1135 Last data filed at 05/09/12 0800  Gross per 24 hour  Intake    940 ml  Output     75 ml  Net    865 ml   General: lying in bed, nad, alert and oriented x 3 HEENT: [place]/at CV: RRR no rubs, murmurs, or gallops Lungs: ctab Abdomen: soft,ntnd, +normal bs Extremities: no cyanosis or edema, warm Neuro: CN 2-12 grossly intact, moving all 4 extremities.   Lab Results: Basic Metabolic Panel:  Lab 05/09/12 1610 05/08/12 0147  NA 140 138  K 4.1 3.3*  CL 106 101  CO2 23 26  GLUCOSE 95 100*  BUN 6 10  CREATININE 0.64 0.65  CALCIUM 9.0 9.4  MG -- 2.2  PHOS -- --   Liver Function Tests:  Lab 05/09/12 0150 05/07/12 1433  AST 16 16  ALT 6 <8  ALKPHOS 56 58  BILITOT 1.2 0.3  PROT 6.3 7.0  ALBUMIN 3.3* 4.2   CBC:  Lab 05/09/12 0951 05/09/12 0150 05/08/12 1707  WBC 4.1 4.2 --  NEUTROABS 2.2 -- 3.5  HGB 9.4* 9.2* --  HCT 31.4* 28.9* --  MCV 73.9* 72.4* --  PLT 440* 442* --   Fasting Lipid Panel:  Lab 05/07/12 1433  CHOL 165  HDL 60  LDLCALC 82  TRIG 114  CHOLHDL 2.8  LDLDIRECT --   Anemia Panel:  Lab 05/08/12 1707 05/08/12 0210  VITAMINB12 -- 368  FOLATE -- 13.4  FERRITIN -- 4*  TIBC  -- Not calculated due to Iron <10.  IRON -- <10*  RETICCTPCT 0.8 --   Urinalysis:  Lab 05/08/12 0235  COLORURINE YELLOW  LABSPEC 1.014  PHURINE 5.5  GLUCOSEU NEGATIVE  HGBUR NEGATIVE  BILIRUBINUR NEGATIVE  KETONESUR NEGATIVE  PROTEINUR NEGATIVE  UROBILINOGEN 0.2  NITRITE NEGATIVE  LEUKOCYTESUR TRACE*   Misc. Labs:none  edications  Scheduled Meds:    . amitriptyline  25 mg Oral QHS  . fluticasone  2 puff Inhalation BID  . pantoprazole  40 mg Oral Daily  . sodium chloride  3 mL Intravenous Q12H   Continuous Infusions:    . sodium chloride 50 mL/hr at 05/08/12 1230   PRN Meds:.sodium chloride, albuterol, ondansetron (ZOFRAN) IV, ondansetron, sodium chloride Assessment/Plan: 56 y.o presented to the ED with dizziness and fatigue seen in clinic 12/11 found to have a hemoglobin of 6.2 and occasional black tarry stools at home.  She has been taking ASA 81 mg tid for 3-4 years and eating cornstarch and ice since childhood.  She had a  colonoscopy in 2008 and had polyps removed.  FOBT negative.  She denies family history of colorectal cancer.   1. Microcytic Anemia:  Unclear etiology likely GI source (r/o malignancy, ulcers, AVMs, portal HTN). She was taking ASA 81mg  three times daily x 3-4 years and PUD is definitely a possibility. Upper GI also possible, with possible history of melena with dark stools reported. Her FOBT is negative, however 05/08/12.  She has microcytic anemia which may be secondary to PICA complications (i.e gastritis, ulcerations).  She has had colonic polyps in 2008, adenomatous polyps with no high grade dysplasia.Hbg 6.5/23.3 with MCV 68.7 s/p 2 units pRBCs H/H 9.2/28.9 with MCV 72.4.  She had target cells on peripheral smear which could indicate iron deficiency (most likely), liver disease, thalassemia, asplenia, etc are also in the differential. Anemia labs indicate Fe deficiency.  Retic. Index <2% meaning hypoproliferation which indicates microcytic anemia.     Plan -CBC q12hr  -GI following for colonoscopy and endoscopy today at 3 PM, appreciate input. If negative they rec CT ab/pelvis to follow -Keep NPO resume diet later today -Protonix 40 orally daily  -will start Fe supplementation at discharge 325 mg bid   2. Hypertension:  -stable, will hold hypertensive meds in setting of hypovolemia possibly secondary to GI blood loss  3. Dizziness -possibly secondary to hypovolemia due to acute blood loss -holding antiHTN meds -looking for source -pending orthostatics today   4.Possible UTI:  -Urine culture pending.   5. Hypokalemia:  -resolved   6. F/E/N -NS 50 cc/hr  -will monitor electrolytes -NPO then advance diet after GI procedures today   Dispo: Disposition is deferred at this time, awaiting improvement of current medical problems.  Anticipated discharge in approximately 2-3day(s).   The patient does have a current PCP (KAPADIA, NEEMA, MD), therefore will be equiring OPC follow-up after discharge.   The patient does not have transportation limitations that hinder transportation to clinic appointments.  .Services Needed at time of discharge: Y = Yes, Blank = No PT:   OT:   RN:   Equipment:   Other:     LOS: 1 day   Annett Gula [phone] 05/09/2012, 11:35 AM

## 2012-05-09 NOTE — Progress Notes (Signed)
Colonoscopy was negative. 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 Each was cauterized. There was  typical initial bleeding with cauterization.  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 Recommend iron supplementation permanently. Continue PPI therapy for approximately one month  Signing off

## 2012-05-10 MED ORDER — FERROUS SULFATE 325 (65 FE) MG PO TABS: 325.0000 mg | ORAL_TABLET | Freq: Two times a day (BID) | ORAL | Status: DC

## 2012-05-10 NOTE — Progress Notes (Signed)
Subjective: No acute issues overnight.  Patient has no complaints this morning.  No light headedness/"swimmy headedness" this morning.  Objective: Vital signs in last 24 hours: Filed Vitals:   05/09/12 2127 05/10/12 0604 05/10/12 0737 05/10/12 0930  BP: 109/62 111/86  121/80  Pulse: 76 78  79  Temp: 97.9 F (36.6 C) 98 F (36.7 C)  99.7 F (37.6 C)  TempSrc: Oral Oral  Oral  Resp: 16 16  18   Height:      Weight: 108 lb 0.4 oz (49 kg)     SpO2: 100% 100% 97% 100%   Weight change: -2 lb 14 oz (-1.304 kg)  Intake/Output Summary (Last 24 hours) at 05/10/12 0958 Last data filed at 05/10/12 1478  Gross per 24 hour  Intake   2060 ml  Output      0 ml  Net   2060 ml   General: lying in bed, nad, alert and oriented x 3   CV: RRR no rubs, murmurs, or gallops  Lungs: ctab, no wheezing/rales/rhonchi Abdomen: soft,ntnd, +normal bs  Extremities: no cyanosis or edema, warm  Neuro: CN 2-12 grossly intact, moving all 4 extremities.   Lab Results: Basic Metabolic Panel:  Lab 05/09/12 2956 05/08/12 0147  NA 140 138  K 4.1 3.3*  CL 106 101  CO2 23 26  GLUCOSE 95 100*  BUN 6 10  CREATININE 0.64 0.65  CALCIUM 9.0 9.4  MG -- 2.2  PHOS -- --   Liver Function Tests:  Lab 05/09/12 0150 05/07/12 1433  AST 16 16  ALT 6 <8  ALKPHOS 56 58  BILITOT 1.2 0.3  PROT 6.3 7.0  ALBUMIN 3.3* 4.2   CBC:  Lab 05/09/12 2112 05/09/12 0951  WBC 5.2 4.1  NEUTROABS 2.7 2.2  HGB 9.4* 9.4*  HCT 30.9* 31.4*  MCV 73.7* 73.9*  PLT 397 440*   Fasting Lipid Panel:  Lab 05/07/12 1433  CHOL 165  HDL 60  LDLCALC 82  TRIG 114  CHOLHDL 2.8  LDLDIRECT --   Thyroid Function Tests:  Lab 05/08/12 1330  TSH 1.643  T4TOTAL --  FREET4 --  T3FREE --  THYROIDAB --   Coagulation:  Lab 05/08/12 1330  LABPROT 13.9  INR 1.08   Anemia Panel:  Lab 05/08/12 1707 05/08/12 0210  VITAMINB12 -- 368  FOLATE -- 13.4  FERRITIN -- 4*  TIBC -- Not calculated due to Iron <10.  IRON -- <10*   RETICCTPCT 0.8 --   Urinalysis:  Lab 05/08/12 0235  COLORURINE YELLOW  LABSPEC 1.014  PHURINE 5.5  GLUCOSEU NEGATIVE  HGBUR NEGATIVE  BILIRUBINUR NEGATIVE  KETONESUR NEGATIVE  PROTEINUR NEGATIVE  UROBILINOGEN 0.2  NITRITE NEGATIVE  LEUKOCYTESUR TRACE*     Micro Results: Recent Results (from the past 240 hour(s))  URINE CULTURE     Status: Normal   Collection Time   05/08/12  6:25 PM      Component Value Range Status Comment   Specimen Description URINE, CLEAN CATCH   Final    Special Requests NONE   Final    Culture  Setup Time 05/08/2012 19:05   Final    Colony Count 40,000 COLONIES/ML   Final    Culture     Final    Value: Multiple bacterial morphotypes present, none predominant. Suggest appropriate recollection if clinically indicated.   Report Status 05/09/2012 FINAL   Final    Medications: I have reviewed the patient's current medications. Scheduled Meds:   . amitriptyline  25  mg Oral QHS  . fluticasone  2 puff Inhalation BID  . pantoprazole  40 mg Oral Daily  . sodium chloride  3 mL Intravenous Q12H   Continuous Infusions:   . sodium chloride 50 mL/hr at 05/10/12 0829   PRN Meds:.sodium chloride, albuterol, ondansetron (ZOFRAN) IV, ondansetron, sodium chloride Assessment/Plan: 56 y.o presented to the ED with dizziness and fatigue seen in clinic 12/11 found to have a hemoglobin of 6.2 and occasional black tarry stools at home. She has been taking ASA 81 mg tid for 3-4 years and eating cornstarch and ice since childhood. She had a colonoscopy in 2008 and had polyps removed. FOBT negative. She denies family history of colorectal cancer.   1. 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 Hb have been stable since transfusion. Plan  -d/c home with iron supplementation; patient to continue PPI therapy - protonix 40 daily -will start Fe supplementation at discharge 325 mg bid   2. Hypertension:  -stable, will restart  hypertensive meds at discharge   3. Dizziness  -resolved - d/t hypovolemia due to chronic blood loss; orthostatic vital sings negative yesterday  4.Possible UTI: asymptomatic .  5. Hypokalemia: resolved   Dispo: d/c home today  The patient does have a current PCP (Kimberly [name], Oluwasemilore Pascuzzi, MD), therefore will be equiring OPC follow-up after discharge.  The patient does not have transportation limitations that hinder transportation to clinic appointments.      .Services Needed at time of discharge: Y = Yes, Blank = No PT:   OT:   RN:   Equipment:   Other:     LOS: 2 days   Kimberly [name], Kimberly [name] 05/10/2012, 9:58 AM

## 2012-05-10 NOTE — Progress Notes (Signed)
Pt discharged to home after visit summary reviewed and pt capable of re verbalizing medications and follow up appointments. Pt remains stable. No signs and symptoms of distress. Educated to return to ER in the event of SOB, dizziness, chest pain, or fainting. Irean Kendricks, RN   

## 2012-05-12 ENCOUNTER — Encounter (HOSPITAL_COMMUNITY): Payer: Self-pay | Admitting: Gastroenterology

## 2012-05-12 NOTE — Discharge Summary (Signed)
Internal Medicine Teaching Service Attending Note Date: 05/12/2012  Patient name: Kimberly [name]  Medical record number: [id number]  Date of birth: 10-Oct-1955    I evaluated the patient on the day of discharge and discussed the discharge plan with my resident team. I agree with the discharge documentation and disposition.  Thank you for working with me on this patient's care.   Thanks Aletta Edouard 05/12/2012, 8:47 AM

## 2012-05-12 NOTE — Discharge Summary (Signed)
Internal Medicine Teaching [facility] Discharge Note  Name: Kimberly [name] MRN: [id number] DOB: 1955/09/07 56 y.o.  Date of Admission: 05/08/2012 12:57 AM Date of Discharge: 05/10/2012 Attending Physician: Dr. Dalphine Handing  Discharge Diagnosis: 1.  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 Iron deficiency anemia 3. 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 Dizziness/lightheadedness, resolved 5. Resolved hypokalemia  6. HYPERTENSION, ESSENTIAL NOS  Discharge Medications:   Medication List     As of 05/12/2012  1:28 AM    STOP taking these medications         aspirin 81 MG tablet      TAKE these medications         albuterol 108 (90 BASE) MCG/ACT inhaler   Commonly known as: PROVENTIL HFA;VENTOLIN HFA   Inhale 2 puffs into the lungs every 4 (four) hours as needed for wheezing.      amitriptyline 25 MG tablet   Commonly known as: ELAVIL   Take 1 tablet (25 mg total) by mouth at bedtime.      ferrous sulfate 325 (65 FE) MG tablet   Take 1 tablet (325 mg total) by mouth 2 (two) times daily with a meal.      fluticasone 110 MCG/ACT inhaler   Commonly known as: FLOVENT HFA   Inhale 2 puffs into the lungs 2 (two) times daily.      hydrochlorothiazide 25 MG tablet   Commonly known as: HYDRODIURIL   Take 1 tablet (25 mg total) by mouth daily.      pantoprazole 40 MG tablet   Commonly known as: PROTONIX   Take 1 tablet (40 mg total) by mouth daily.        Disposition and follow-up:   Kimberly [name] was discharged from [facility] in stable condition.  At the hospital follow up visit please address  1) Patient needs to see Lynnae January for housing 2) repeat CBC  Follow-up Appointments:     Follow-up Information    Follow up with Kristie Cowman, MD. On 05/15/2012. (at 3:15 pm)    Contact information:   [address] [place] [address] [phone]        Discharge Orders    Future Appointments: Provider: Department: Dept Phone: Center:   05/15/2012 3:15 PM Manuela Schwartz, MD [facility] [phone] [facility]     Future Orders Please Complete By Expires   Diet - low sodium heart healthy      Increase activity slowly      Discharge instructions      Comments:   Please start taking iron (ferrous sulfate) twice daily.  We may need to increase this to 3-4 times daily.  Continue taking protonix daily.   Call MD for:  temperature >100.4      Call MD for:  persistant nausea and vomiting      Call MD for:  difficulty breathing, headache or visual disturbances      Call MD for:  persistant dizziness or light-headedness         Consultations:  1) [facility]-Dr. Arlyce Dice 2) Internal Medicine  Procedures Performed:  Dg Chest 2 View  05/08/2012  *RADIOLOGY REPORT*  Clinical Data: Cough.  Anemia.  Tachycardia.  CHEST - 2 VIEW  Comparison: 07/16/2010  Findings: Hyperinflation. The heart size and pulmonary vascularity are normal. The lungs appear clear and expanded without focal air space disease or consolidation. No blunting of  the costophrenic angles. No pneumothorax.  Mediastinal contours appear intact.  No significant change since previous study.  IMPRESSION: Hyperinflation.  No evidence of active pulmonary disease.   Original Report Authenticated By: Burman Nieves, M.D.     Admission HPI:  Chief Complaint: fatigue, anemia  History of Present Illness:  Kimberly [name] is a 56 year old woman with PMH of HLP, HTN, asthma, and GERD who presents to the ED for dizziness and fatigue. She was seen in clinic on 12/11 and was found to have Hb 6.2. She is very vague with her history. She states that she has been feeling fatigued for a while and that occasionally she has "dark stools" but she attributes her dark stool to certain foods she eats. She also complains of of occasional leg cramps and feeling cold in her  extremities. He had URI symptoms over Thanksgiving but they have now subsided.She has left shoulder pain that has been present for days. She denies any bright red blood per rectum, hemoptysis, abdominal pain, N/V, change in bowel habit, chest pain, SOB, weight loss, hematuria, or vaginal bleeding . She has been taking ASA 81mg  three times per day. She had a colonoscopy in 2008 and had polyps removed. She notes that there is no history of bleeding disorders or colorectal cancer.   Review of Systems:  Constitutional: Denies fever, chills, diaphoresis, appetite change and + fatigue.  HEENT: Denies photophobia, eye pain, redness, hearing loss, ear pain, congestion, sore throat, rhinorrhea, sneezing, mouth sores, trouble swallowing, neck pain, neck stiffness and tinnitus.  Respiratory: Denies SOB, DOE, cough, chest tightness, and wheezing.  Cardiovascular: Denies chest pain, palpitations and leg swelling.  Gastrointestinal: Denies nausea, vomiting, abdominal pain, diarrhea, constipation, blood in stool and abdominal distention.  Genitourinary: Denies dysuria, urgency, frequency, hematuria, flank pain and difficulty urinating. +burning sensation  Musculoskeletal: Left shoulder pain. Denies myalgias, back pain, joint swelling, arthralgias and gait problem.  Skin: Denies pallor, rash and wound.  Neurological: Denies dizziness, seizures, syncope, weakness, +light-headedness, numbness and headaches.  Hematological: Denies adenopathy. Easy bruising, personal or family bleeding history  Psychiatric/Behavioral: Denies suicidal ideation, mood changes, confusion, nervousness, sleep disturbance and agitation   Physical Exam:  Blood pressure 115/63, pulse 86, temperature 98.1 F (36.7 C), temperature source Oral, resp. rate 16, height 5\' 4"  (1.626 m), weight 106 lb (48.081 kg), last menstrual period 08/21/1982, SpO2 100.00%.  General: alert, well-developed, and cooperative to examination. Very thin with facial  pallor.  Head: normocephalic and atraumatic.  Eyes: vision grossly intact, pupils equal, pupils round, pupils reactive to light, no injection and anicteric.  Mouth: pharynx pink and moist, no erythema, and no exudates.  Neck: supple, full ROM, no thyromegaly, no JVD.  Lungs: normal respiratory effort, no accessory muscle use, normal breath sounds, no crackles, and no wheezes. Heart: normal rate, regular rhythm, no murmur, no gallop, and no rub.  Abdomen: soft, non-tender, normal bowel sounds, no distention, no guarding, no rebound tenderness, no hepatomegaly, and no splenomegaly.  Msk: Left shoulder tender to touch over the scapular region.  Pulses: 2+ DP/PT pulses bilaterally Extremities: No cyanosis, clubbing, or edema. Cold feet.  Neurologic: alert & oriented X3, cranial nerves II-XII intact, strength normal in all extremities, sensation intact to light touch, and gait normal.  Skin: turgor normal and no rashes.  Psych: Somewhat guarded, but normally interactive, good eye contact, not anxious appearing, and not depressed appearing, she smiles at times.    Hospital Course by problem list: 56 y.o. presented to the  ED with dizziness and fatigue seen in clinic 12/11 found to have a hemoglobin of 6.2 and 6.5 on 12/12 with occasional black tarry stools at home. She has been taking Aspirin 81 mg tid for 3-4 years and eating cornstarch and ice since childhood. She had a colonoscopy in 2008 and had benign polyps removed. FOBT negative this admission. She denies family history of colorectal cancer  1.  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  She has microcytic anemia on admission with a hemoglobin/hematocrit 6.5/23.3 with MCV 68.7 on admission and hemoglobin of 6.2 the day before admission.  This was likely secondary to AVMs in the duodenum noted on upper  endoscopy with a normal colon by Dr. Arlyce Dice, gastroenterologist.  She was given 2 units of blood this admission with trending of CBC.  She was started on Protonix this admission.  She had target cells on peripheral smear which could indicate iron deficiency (most likely) as anemia labs indicate Fe deficiency. Reticulocyte Index was <2% meaning hypoproliferation which indicates microcytic anemia.  We started iron supplementation at discharge 325 mg bid indefinitely (per Dr. Arlyce Dice) and Protonix 40 mg daily at least for one month.   2. Iron deficiency anemia  3. Pica in adults It is unknown if PICA can lead to microcytic anemia or microcytic anemia can lead to PICA.  She was eating a box of cornstarch and ice prior to admission.  She has been doing both since a child.  She was advised to stop due to potential complications with PICA i.e mechanical obstruction, gastritis, etc.    4. Dizziness/lightheadedness, resolved Possibly secondary to hypovolemia due to acute blood loss.  She was placed on 150 cc/hr normal saline that was decreased to 50 cc/hr.  Her orthostatics lying 119/75 (94), sitting 117/74 (47), standing 131/87 (101).  We held antihypertensive medications this admission.     5. Resolved hypokalemia  Resolved.  May have been secondary to thiazide use prior to admission.  Magnesium ws 2.2 normal.   6. HYPERTENSION, ESSENTIAL NOS Held antihypertensives this admission.   Discharge Vitals:  BP 121/80  Pulse 79  Temp 99.7 F (37.6 C) (Oral)  Resp 18  Ht 5\' 4"  (1.626 m)  Wt 108 lb 0.4 oz (49 kg)  BMI 18.54 kg/m2  SpO2 100%  LMP 08/21/1982  Discharge Labs:  Discharge Physical Exam: General: lying in bed, nad, alert and oriented x 3  CV: RRR no rubs, murmurs, or gallops  Lungs: ctab, no wheezing/rales/rhonchi  Abdomen: soft,ntnd, +normal bs  Extremities: no cyanosis or edema, warm  Neuro: CN 2-12 grossly intact, moving all 4 extremities.   Results for CHRISTALYN, GOERTZ (MRN  [id number]) as of 05/12/2012 01:28  Ref. Range 05/07/2012 14:33 05/08/2012 01:47 05/09/2012 01:50  Sodium Latest Range: 135-145 mEq/L 139 138 140  Potassium Latest Range: 3.5-5.1 mEq/L 4.1 3.3 (L) 4.1  Chloride Latest Range: 96-112 mEq/L 102 101 106  CO2 Latest Range: 19-32 mEq/L 27 26 23   BUN Latest Range: 6-23 mg/dL 13 10 6   Creatinine Latest Range: 0.50-1.10 mg/dL 7.82 9.56 2.13  Calcium Latest Range: 8.4-10.5 mg/dL 9.5 9.4 9.0  GFR calc non Af Amer Latest Range: >90 mL/min  >90 >90  GFR calc Af Amer Latest Range: >90 mL/min  >90 >90  Glucose Latest Range: 70-99 mg/dL 90 086 (H) 95  Magnesium Latest Range: 1.5-2.5 mg/dL  2.2   Alkaline Phosphatase  Latest Range: 39-117 U/L 58  56  Albumin Latest Range: 3.5-5.2 g/dL 4.2  3.3 (L)  AST Latest Range: 0-37 U/L 16  16  ALT Latest Range: 0-35 U/L <8  6  Total Protein Latest Range: 6.0-8.3 g/dL 7.0  6.3  Total Bilirubin Latest Range: 0.3-1.2 mg/dL 0.3  1.2   Results for GARIELLE, MROZ (MRN [id number]) as of 05/12/2012 01:28  Ref. Range 05/07/2012 14:33  Cholesterol Latest Range: 0-200 mg/dL 409  Triglycerides Latest Range: <150 mg/dL 811  HDL Latest Range: >39 mg/dL 60  LDL (calc) Latest Range: 0-99 mg/dL 82  VLDL Latest Range: 0-40 mg/dL 23  Total CHOL/HDL Ratio No range found 2.8   Results for RAVAN, SCHLEMMER (MRN [id number]) as of 05/12/2012 01:28  Ref. Range 05/08/2012 02:10  Iron Latest Range: 42-135 ug/dL <21 (L)  UIBC Latest Range: 125-400 ug/dL 308 (H)  TIBC Latest Range: 250-470 ug/dL Not calculated due to Iron <10.  Saturation Ratios Latest Range: 20-55 % Not calculated due to Iron <10.  Ferritin Latest Range: 10-291 ng/mL 4 (L)  Folate No range found 13.4   Results for SENAIDA, CHILCOTE (MRN [id number]) as of 05/12/2012 01:28  Ref. Range 05/08/2012 02:10  Vitamin B-12 Latest Range: 211-911 pg/mL 368   Results for NOVALYNN, BRANAMAN (MRN [id number]) as of 05/12/2012 01:28  Ref. Range 05/08/2012 17:07 05/09/2012 01:50  05/09/2012 09:51 05/09/2012 21:12  WBC Latest Range: 4.0-10.5 K/uL 5.5 4.2 4.1 5.2  RBC Latest Range: 3.87-5.11 MIL/uL 4.24 3.99 4.25 4.19  Hemoglobin Latest Range: 12.0-15.0 g/dL 9.6 (L) 9.2 (L) 9.4 (L) 9.4 (L)  HCT Latest Range: 36.0-46.0 % 30.6 (L) 28.9 (L) 31.4 (L) 30.9 (L)  MCV Latest Range: 78.0-100.0 fL 72.2 (L) 72.4 (L) 73.9 (L) 73.7 (L)  MCH Latest Range: 26.0-34.0 pg 22.6 (L) 23.1 (L) 22.1 (L) 22.4 (L)  MCHC Latest Range: 30.0-36.0 g/dL 40.1 02.7 25.3 (L) 66.4  RDW Latest Range: 11.5-15.5 % 17.9 (H) 17.9 (H) 18.2 (H) 18.6 (H)  Platelets Latest Range: 150-400 K/uL 441 (H) 442 (H) 440 (H) 397  Neutrophils Relative Latest Range: 43-77 % 63  53 52  Lymphocytes Relative Latest Range: 12-46 % 25  32 32  Monocytes Relative Latest Range: 3-12 % 9  8 10   Eosinophils Relative Latest Range: 0-5 % 2  5 4   Basophils Relative Latest Range: 0-1 % 1  2 (H) 2 (H)  NEUT# Latest Range: 1.7-7.7 K/uL 3.5  2.2 2.7  Lymphocytes Absolute Latest Range: 0.7-4.0 K/uL 1.4  1.3 1.7  Monocytes Absolute Latest Range: 0.1-1.0 K/uL 0.5  0.3 0.5  Eosinophils Absolute Latest Range: 0.0-0.7 cells/mcL 0.1  0.2 0.2  Basophils Absolute Latest Range: 0.0-0.1 K/uL 0.1  0.1 0.1  RBC Morphology No range found   POLYCHROMASIA PRESENT   RBC. Latest Range: 3.87-5.11 MIL/uL 4.24     Retic Ct Pct Latest Range: 0.4-3.1 % 0.8     Retic Count, Manual Latest Range: 19.0-186.0 K/uL 33.9      Results for JINELLE, BUTCHKO (MRN [id number]) as of 05/12/2012 01:28  Ref. Range 05/07/2012 14:33 05/08/2012 01:47 05/08/2012 02:10 05/08/2012 13:30  WBC Latest Range: 4.0-10.5 K/uL 7.4 7.3  5.5  RBC Latest Range: 3.87-5.11 MIL/uL 3.36 (L) 3.39 (L)  4.17  Hemoglobin Latest Range: 12.0-15.0 g/dL 6.2 (LL) 6.5 (LL)  9.5 (L)  HCT Latest Range: 36.0-46.0 % 22.8 (L) 23.3 (L)  30.7 (L)  MCV Latest Range: 78.0-100.0 fL 67.7 (L) 68.7 (L)  73.6 (L)  MCH Latest Range:  26.0-34.0 pg 18.5 (L) 19.2 (L)  22.8 (L)  MCHC Latest Range: 30.0-36.0 g/dL 47.8  (L) 29.5 (L)  62.1  RDW Latest Range: 11.5-15.5 % 16.0 (H) 15.7 (H)  18.2 (H)  Platelets Latest Range: 150-400 K/uL 568 (H) 480 (H)  428 (H)  Neutrophils Relative Latest Range: 43-77 % 68 87 (H)    Lymphocytes Relative Latest Range: 12-46 % 23 9 (L)    Monocytes Relative Latest Range: 3-12 % 7 3    Eosinophils Relative Latest Range: 0-5 % 1 0    Basophils Relative Latest Range: 0-1 % 1 1    NEUT# Latest Range: 1.7-7.7 K/uL 5.1 6.3    Lymphocytes Absolute Latest Range: 0.7-4.0 K/uL 1.7 0.7    Monocytes Absolute Latest Range: 0.1-1.0 K/uL 0.5 0.2    Eosinophils Absolute Latest Range: 0.0-0.7 cells/mcL 0.1 0.0    Basophils Absolute Latest Range: 0.0-0.1 K/uL 0.1 0.1    RBC Morphology No range found  POLYCHROMASIA PRESENT    Smear Review No range found Criteria for review not met     RBC. Latest Range: 3.87-5.11 MIL/uL   3.30 (L)   Retic Ct Pct Latest Range: 0.4-3.1 %   1.0   Retic Count, Manual Latest Range: 19.0-186.0 K/uL   33.0   Tech Review No range found    TARGET CELLS   Results for BULAR, HICKOK (MRN [id number]) as of 05/12/2012 01:28  Ref. Range 05/08/2012 13:30  Prothrombin Time Latest Range: 11.6-15.2 seconds 13.9  INR Latest Range: 0.00-1.49  1.08   Results for ILIYAH, BUI (MRN [id number]) as of 05/12/2012 01:28  Ref. Range 05/08/2012 13:30  TSH Latest Range: 0.350-4.500 uIU/mL 1.643   Results for EVYNN, BOUTELLE (MRN [id number]) as of 05/12/2012 01:28  Ref. Range 05/08/2012 02:35  Color, Urine Latest Range: YELLOW  YELLOW  APPearance Latest Range: CLEAR  CLEAR  Specific Gravity, Urine Latest Range: 1.005-1.030  1.014  pH Latest Range: 5.0-8.0  5.5  Glucose Latest Range: NEGATIVE mg/dL NEGATIVE  Bilirubin Urine Latest Range: NEGATIVE  NEGATIVE  Ketones, ur Latest Range: NEGATIVE mg/dL NEGATIVE  Protein Latest Range: NEGATIVE mg/dL NEGATIVE  Urobilinogen, UA Latest Range: 0.0-1.0 mg/dL 0.2  Nitrite Latest Range: NEGATIVE  NEGATIVE  Leukocytes, UA Latest Range:  NEGATIVE  TRACE (A)  Hgb urine dipstick Latest Range: NEGATIVE  NEGATIVE  WBC, UA Latest Range: <3 WBC/hpf 0-2  RBC / HPF Latest Range: <3 RBC/hpf 0-2  Squamous Epithelial / LPF Latest Range: RARE  RARE  Bacteria, UA Latest Range: RARE  RARE   05/08/12-Urine culture multiple morphotypes 40K colonies  Results for SIMCHA, FARRINGTON (MRN [id number]) as of 05/12/2012 01:28  Ref. Range 05/08/2012 02:08  Fecal Occult Blood, POC No range found NEGATIVE    Signed: Annett Gula 05/12/2012, 1:28 AM   Time Spent on Discharge: 30 minutes  Services Ordered on Discharge: none Equipment Ordered on Discharge: none

## 2012-05-13 ENCOUNTER — Encounter: Payer: Self-pay | Admitting: Licensed Clinical Social Worker

## 2012-05-13 ENCOUNTER — Telehealth: Payer: Self-pay | Admitting: Licensed Clinical Social Worker

## 2012-05-13 NOTE — Telephone Encounter (Signed)
CSW received call from Ms. Spofford.  Pt states she needs assistance with housing.  Pt is currently living with a friend.  Ms. Mcquigg has contacted [employer] and is aware there is currently a hold on their wait list.  Ms. Kohler is currently unemployed.  Pt's plan is to obtain transportation and then employment.  CSW informed Ms. Quigg of information on shelters, [place] affordable housing based on amount to be paid in rent, and affordable housing for seniors.  Ms. Pharo aware CSW will not be in office during [facility] appt, but pt requesting CSW to leave information at front desk.  CSW will leave housing and vocational information for Ms. Asaro at front desk for 12/19 appt.

## 2012-05-15 ENCOUNTER — Ambulatory Visit (INDEPENDENT_AMBULATORY_CARE_PROVIDER_SITE_OTHER): Payer: Self-pay | Admitting: Internal Medicine

## 2012-05-15 ENCOUNTER — Encounter: Payer: Self-pay | Admitting: Internal Medicine

## 2012-05-15 VITALS — BP 141/85 | HR 92 | Temp 97.0°F | Wt 104.5 lb

## 2012-05-15 DIAGNOSIS — D509 Iron deficiency anemia, unspecified: Secondary | ICD-10-CM

## 2012-05-15 DIAGNOSIS — K31819 Angiodysplasia of stomach and duodenum without bleeding: Secondary | ICD-10-CM

## 2012-05-15 LAB — CBC
HCT: 35.7 % — ABNORMAL LOW (ref 36.0–46.0)
MCV: 73.3 fL — ABNORMAL LOW (ref 78.0–100.0)
RBC: 4.87 MIL/uL (ref 3.87–5.11)
WBC: 5.7 10*3/uL (ref 4.0–10.5)

## 2012-05-15 MED ORDER — PANTOPRAZOLE SODIUM 40 MG PO TBEC: 40.0000 mg | DELAYED_RELEASE_TABLET | Freq: Every day | ORAL | Status: DC

## 2012-05-15 NOTE — Progress Notes (Signed)
  Subjective:    Patient ID: Kimberly [name], female    DOB: 16-Jun-1955, 56 y.o.   MRN: [id number]  HPI  Pt presents for hosp-f/u for microcytic anemia.  Admitted with dizziness and fatigue with hemoglobin 6.2 and black tarry stools. Arteriovenous malformations of duodenum found on endoscopy per Dr. Arlyce Dice of [facility] during hospitalization. Started on iron supplements and PPI.  Hgb 9.4 on discharge.  Review of Systems  Constitutional: Negative.   HENT: Negative.   Eyes: Negative.   Respiratory: Negative for shortness of breath.   Cardiovascular: Negative for chest pain, palpitations and leg swelling.  Gastrointestinal: Negative for nausea, abdominal pain, diarrhea, constipation, blood in stool and anal bleeding.  Genitourinary: Negative.   Musculoskeletal: Negative.   Skin: Negative for color change and rash.  Neurological: Negative.   Hematological: Negative.   Psychiatric/Behavioral: Negative.        Objective:   Physical Exam  Constitutional: She is oriented to person, place, and time. She appears well-developed and well-nourished. No distress.  HENT:  Head: Normocephalic and atraumatic.  Eyes: Conjunctivae normal and EOM are normal. Pupils are equal, round, and reactive to light. No scleral icterus.  Neck: Normal range of motion.  Cardiovascular: Normal rate, regular rhythm, normal heart sounds and intact distal pulses.   No murmur heard. Pulmonary/Chest: Effort normal and breath sounds normal.  Abdominal: Bowel sounds are normal. There is no tenderness.  Musculoskeletal: Normal range of motion. She exhibits no edema.  Neurological: She is alert and oriented to person, place, and time. She has normal reflexes.  Skin: Skin is warm and dry.  Psychiatric: She has a normal mood and affect. Her behavior is normal. Judgment and thought content normal.          Assessment & Plan:  1. Microcytic anemia secondary to blood loss via AVMs: pt reports resolved dizziness and  fatigue -check CBC today  2. Arteriovenous Malformations of duodenum: refilled Protonix today, cont Fe supplements  3. Financial hardships: spoke with Child psychotherapist and provided housing information

## 2012-05-19 ENCOUNTER — Encounter: Payer: Self-pay | Admitting: Licensed Clinical Social Worker

## 2012-05-19 ENCOUNTER — Telehealth: Payer: Self-pay | Admitting: Licensed Clinical Social Worker

## 2012-05-19 NOTE — Telephone Encounter (Signed)
CSW returned call to Kimberly [name].  Pt states she called apartments on affordable housing and they are only accepting 62 years and older.  In addition, pt states another complex is $450/month but another $450 deposit and pt can not afford at this time.  Pt states she is aware the [facility] wait list is closed but requesting if CSW can place application.  CSW informed [name], this CSW can not complete [facility] application but there is another program pt may benefit from that is affiliated with the [facility], the [facility] [employer].  CSW inquired if Ms. Ricotta ever sought mental health treatment for Pica.  Pt states she has not received mental health treatment.  CSW will discuss with pt's PCP and in the mean time, CSW will send pt information about the Supportive Housing Program and request pt continue to work with renewing her [employer] and obtain letter as statement of her homelessness.

## 2012-05-26 NOTE — Telephone Encounter (Signed)
Pt returned call to CSW left message stating paperwork signed and mailed in to CSW on Saturday.  Pt inquiring if Supportive Housing Program was temporary.  CSW returned call to Ms. Nazario informed pt Support Housing Program is through [facility] and is not temporary housing.  Pt states "okay, I just need to charge my phone".

## 2012-06-04 ENCOUNTER — Telehealth: Payer: Self-pay | Admitting: Licensed Clinical Social Worker

## 2012-06-04 ENCOUNTER — Encounter: Payer: Self-pay | Admitting: Licensed Clinical Social Worker

## 2012-06-04 NOTE — Telephone Encounter (Signed)
Pt called in to speak with CSW to obtain status of supportive housing program.  CSW informed Kimberly [name] she will need to complete her [employer] card application, establish with mental health services and provide documentation from family prior to CSW assisting with the Supportive Housing Program.  Kimberly [name] returned form but had daughter sign where pt should have and still needs to provide letter of homelessness and establish with mental health.  Pt requesting CSW to resend paperwork.

## 2012-06-05 ENCOUNTER — Encounter: Payer: Self-pay | Admitting: Licensed Clinical Social Worker

## 2012-06-05 NOTE — Progress Notes (Signed)
Kimberly [name] presents today, 06/04/2012, as a walk-in requesting to meet with CSW.  Pt states she is also meeting with financial counselor to complete her [facility] application, but does not have all the necessary information.  CSW presented Kimberly [name] with letter and additional form for the supportive housing program.  Encouraged Kimberly [name] to become established with [facility] and complete her [facility] card application.  Provided pt with information on the walk-in hours and address to [facility].  Pt in need of bus pass to return home, CSW provide pt with one way bus fare. 06/05/2012 - Pt placed call this morning requesting phone number to [facility] so appt can be scheduled.  CSW returned call to Kimberly [name] informed pt in order to establish services, must utilize walk-in hours.  CSW provided the following steps to pt: 1. Walk-in to [facility] between 8am-3pm, 2. Sign ROI for [facility] to communicate with [facility], 3. After meeting with therapist and psychiatrist, call CSW to notify.  CSW will initiate supportive housing application.  CSW will contact pt after application is completed by [facility].

## 2012-06-09 NOTE — Telephone Encounter (Signed)
Do not believe patient needs mental health follow up for PICA; appreciate CSW help with housing situation.

## 2012-06-17 ENCOUNTER — Telehealth: Payer: Self-pay | Admitting: Licensed Clinical Social Worker

## 2012-06-17 ENCOUNTER — Encounter: Payer: Self-pay | Admitting: Licensed Clinical Social Worker

## 2012-06-17 NOTE — Telephone Encounter (Signed)
CSW returned call to Ms. Eccleston at daughter's phone number.  Pt states her phone has limited minutes.  Ms. Sobieski left message for CSW on 06/14/2012 stating she was told to leave residence where she was staying due to breaking the hinges off the door.  Pt is currently living with daughter.  Ms. Baskins states she has an appt with [facility] psychiatry on 2/7 and therapist on 2/11.  Pt requesting assistance with bus fare.  CSW offered pt 2 bus one way passes.  CSW left passes at front office along with [facility] ROI, and Supportive Housing paperwork.  CSW informed Ms. Nevills now that pt has [employer] card can apply for transportation to [facility] medical appt.  Pt in agreement for CSW to complete application.  CSW completed application and faxed.  Once pt signs ROI for [facility], will fax Supportive housing mental health form to [facility].

## 2012-06-19 ENCOUNTER — Other Ambulatory Visit: Payer: Self-pay | Admitting: *Deleted

## 2012-06-20 MED ORDER — ALBUTEROL SULFATE HFA 108 (90 BASE) MCG/ACT IN AERS: 2.0000 | INHALATION_SPRAY | RESPIRATORY_TRACT | Status: DC | PRN

## 2012-06-23 NOTE — Telephone Encounter (Signed)
Called to pharm 

## 2012-07-02 ENCOUNTER — Other Ambulatory Visit: Payer: Self-pay | Admitting: *Deleted

## 2012-07-02 MED ORDER — FLUTICASONE PROPIONATE HFA 110 MCG/ACT IN AERO: 2.0000 | INHALATION_SPRAY | Freq: Two times a day (BID) | RESPIRATORY_TRACT | Status: DC

## 2012-07-02 NOTE — Telephone Encounter (Signed)
Rx faxed in.

## 2012-07-03 NOTE — Addendum Note (Signed)
Addended by: Bufford Spikes on: 07/03/2012 09:53 AM   Modules accepted: Orders

## 2012-07-04 ENCOUNTER — Telehealth: Payer: Self-pay | Admitting: *Deleted

## 2012-07-04 NOTE — Telephone Encounter (Signed)
Pt calls asking for appt r/t BP being low, states when she went to mental health today it was low, she does not know the exact reading and states she was told that she would need to see her pcp if she wanted to know why it was low. She states she would like appt mon am, it is scheduled mon at 0830 2/10 dr Dorthula Rue. She denies dizziness, shortness of breath, weakness.

## 2012-07-07 ENCOUNTER — Ambulatory Visit (INDEPENDENT_AMBULATORY_CARE_PROVIDER_SITE_OTHER): Payer: No Typology Code available for payment source | Admitting: Internal Medicine

## 2012-07-07 ENCOUNTER — Encounter: Payer: Self-pay | Admitting: Internal Medicine

## 2012-07-07 ENCOUNTER — Encounter: Payer: Self-pay | Admitting: Licensed Clinical Social Worker

## 2012-07-07 VITALS — BP 106/70 | HR 79 | Temp 96.9°F | Ht 64.0 in | Wt 107.7 lb

## 2012-07-07 DIAGNOSIS — R82998 Other abnormal findings in urine: Secondary | ICD-10-CM

## 2012-07-07 DIAGNOSIS — R8290 Unspecified abnormal findings in urine: Secondary | ICD-10-CM | POA: Insufficient documentation

## 2012-07-07 DIAGNOSIS — I10 Essential (primary) hypertension: Secondary | ICD-10-CM

## 2012-07-07 LAB — POCT URINALYSIS DIPSTICK
Bilirubin, UA: NEGATIVE
Blood, UA: NEGATIVE
Glucose, UA: NEGATIVE
Spec Grav, UA: 1.015

## 2012-07-07 MED ORDER — HYDROCHLOROTHIAZIDE 25 MG PO TABS: 12.5000 mg | ORAL_TABLET | Freq: Every day | ORAL | Status: DC

## 2012-07-07 NOTE — Patient Instructions (Addendum)
General Instructions: Please schedule a follow up appointment in 1- 2 weeks for BP recheck . Please bring your medication bottles with your next appointment. Please take your medicines as prescribed. I will call you with your lab results if anything will be abnormal. Please reduce your BP pill ( HCTZ ) from one to half tablet. Please get your BP checked at local pharmacy and get few readings before coming to next appointment. Please callus if you feel lightheaded, dizzy etc.     Treatment Goals:  Goals (1 Years of Data) as of 07/07/12         As of Today 05/15/12 05/15/12 05/10/12 05/10/12     Blood Pressure    . Blood Pressure < 140/90  106/70 141/85 143/92 121/80 111/86      Progress Toward Treatment Goals:  Treatment Goal 07/07/2012  Blood pressure at goal    Self Care Goals & Plans:  Self Care Goal 07/07/2012  Manage my medications take my medicines as prescribed; refill my medications on time  Monitor my health keep track of my blood pressure  Eat healthy foods eat foods that are low in salt  Be physically active join a walking program       Care Management & Community Referrals:  Referral 07/07/2012  Referrals made for care management support none needed  Referrals made to community resources -

## 2012-07-07 NOTE — Assessment & Plan Note (Signed)
She reports having foul-smelling urine for about a month coincident with her starting iron pills. Denies any discoloration of urine. She was reassured that iron pills do not cause foul-smelling urine and she should continue taking them. -Check wet prep, GC/Chlamydia, urine dipstick to look for any other explanation for her symptoms.

## 2012-07-07 NOTE — Progress Notes (Signed)
Subjective:   Patient ID: Kimberly [name] female   DOB: 1956-01-21 57 y.o.   MRN: [id number]  HPI: 57 year old woman with past medical history significant for hypertension, depression comes to the clinic for evaluation of low blood pressure.  She reports that she was told that her blood pressure has been running low with her recent visit at [facility] She could not tell exactly what the reading was. Denies any headaches, dizziness, blurry vision. She took her morning dose of HCTZ prior to clinic appointment.  She also reports that she has been noticing foul-smelling urine since she  started taking iron pills. Denies any vaginal discharge , discoloration of urine , dysuria, urgency, frequency, fever, chills, nausea or vomiting.   Past Medical History  Diagnosis Date  . Gallstone   . Hyperlipidemia   . Hypertension   . Depression   . Anemia     macrocytic  . Asthma   . Menopausal syndrome   . GERD (gastroesophageal reflux disease)   . Tobacco abuse   . Allergy     rhinitis  . Alcohol abuse, in remission     since around 2005  . Otitis externa, acute Feb 2012    bilateral, treated with azithromycin after failure of neomycin drops  . Otitis media, acute Feb 2012  . Vaginal discharge 2011  . Callus of foot 2009  . Chest pain, musculoskeletal 2011  . Excessive cerumen in ear canal     since before 2000  . Cholelithiasis   . PONV (postoperative nausea and vomiting)   . Alcoholic hepatitis     fatty liver on imaging   Family History  Problem Relation Age of Onset  . Hypertension Mother    History   Social History  . Marital Status: Single    Spouse Name: N/A    Number of Children: N/A  . Years of Education: N/A   Occupational History  . Not on file.   Social History Main Topics  . Smoking status: Former Smoker    Types: Cigarettes    Quit date: 08/21/1995  . Smokeless tobacco: Not on file  . Alcohol Use: No     Comment: in remission since 2007  . Drug Use: No  .  Sexually Active: No   Other Topics Concern  . Not on file   Social History Narrative   2   Review of Systems: General: Denies fever, chills, diaphoresis, appetite change and fatigue. HEENT: Denies photophobia, eye pain, redness, hearing loss, ear pain, congestion, sore throat, rhinorrhea, sneezing, mouth sores, trouble swallowing, neck pain, neck stiffness and tinnitus. Respiratory: Denies SOB, DOE, cough, chest tightness, and wheezing. Cardiovascular: Denies to chest pain, palpitations and leg swelling. Gastrointestinal: Denies nausea, vomiting, abdominal pain, diarrhea, constipation, blood in stool and abdominal distention. Genitourinary: Denies dysuria, urgency, frequency, hematuria, flank pain and difficulty urinating. Musculoskeletal: Denies myalgias, back pain, joint swelling, arthralgias and gait problem.  Skin: Denies pallor, rash and wound. Neurological: Denies dizziness, seizures, syncope, weakness, light-headedness, numbness and headaches. Hematological: Denies adenopathy, easy bruising, personal or family bleeding history. Psychiatric/Behavioral: Denies suicidal ideation, mood changes, confusion, nervousness, sleep disturbance and agitation.    Current Outpatient Medications: Current Outpatient Prescriptions  Medication Sig Dispense Refill  . albuterol (PROVENTIL HFA;VENTOLIN HFA) 108 (90 BASE) MCG/ACT inhaler Inhale 2 puffs into the lungs every 4 (four) hours as needed for wheezing.  3 Inhaler  3  . amitriptyline (ELAVIL) 25 MG tablet Take 1 tablet (25 mg total) by mouth at bedtime.  30 tablet  5  . ferrous sulfate 325 (65 FE) MG tablet Take 1 tablet (325 mg total) by mouth 2 (two) times daily with a meal.  90 tablet  3  . fluticasone (FLOVENT HFA) 110 MCG/ACT inhaler Inhale 2 puffs into the lungs 2 (two) times daily.  3 Inhaler  1  . hydrochlorothiazide (HYDRODIURIL) 25 MG tablet Take 1 tablet (25 mg total) by mouth daily.  90 tablet  1  . pantoprazole (PROTONIX) 40 MG  tablet Take 1 tablet (40 mg total) by mouth daily.  90 tablet  0   No current facility-administered medications for this visit.    Allergies: Allergies  Allergen Reactions  . Banana     Lip swelling  . Grape (Artificial) Flavor     Allergic to grapes-swelling   . Ibuprofen     REACTION: rash  . Penicillins     REACTION: rash      Objective:   Physical Exam: Filed Vitals:   07/07/12 0832  BP: 106/70  Pulse: 79  Temp: 96.9 F (36.1 C)    General: Vital signs reviewed and noted. Well-developed, well-nourished, in no acute distress; alert, appropriate and cooperative throughout examination. Head: Normocephalic, atraumatic Lungs: Normal respiratory effort. Clear to auscultation BL without crackles or wheezes. Heart: RRR. S1 and S2 normal without gallop, murmur, or rubs. Abdomen:BS normoactive. Soft, Nondistended, non-tender.  No masses or organomegaly. Extremities: No pretibial edema. Genitourinary: No abnomal discharge was noted, no abnormal masses were felt     Assessment & Plan:

## 2012-07-07 NOTE — Assessment & Plan Note (Signed)
Patient reports that she was told that her blood pressure has been running low with her recent visit at [facility] but could not exactly tell what the reading was. Her blood pressure today was 106/70. She denies any symptoms of dizziness or lightheadedness. - Reduce her HCTZ from 25-12.5 daily - Return to clinic in one to 2 weeks for blood pressure recheck. In the interim patient was advised to get a few more readings prior to her next clinic appointment which would help in appropriate adjustment of her medication dose.

## 2012-07-08 NOTE — Progress Notes (Signed)
Kimberly [name] requesting to speak with CSW, as pt was in need of bus pass for transportation home from [facility] appt.  CSW provided pt with bus pass and brochure for [facility] transportation.  CSW informed [name] that pt is registered to receive [facility] transportation to [facility] appt but needs to call and get on transportation schedule.  Pt voiced understanding and informed CSW she kept her appt at [facility].  CSW provided support and encouragement for keeping appt and CSW will continue to work on supportive housing application.

## 2012-07-14 ENCOUNTER — Encounter: Payer: Self-pay | Admitting: Licensed Clinical Social Worker

## 2012-07-14 NOTE — Progress Notes (Signed)
Patient ID: Kimberly [name], female   DOB: 11-06-1955, 57 y.o.   MRN: [id number] Ms. Azizi has informed CSW that she has established with [employer].  Pt reports to CSW that she was dx with depression and started on medication.  CSW faxed pt signed release for Supportive Housing Program to [place], requesting psychiatrist to complete and fax back to CSW.

## 2012-07-21 ENCOUNTER — Encounter: Payer: Self-pay | Admitting: Internal Medicine

## 2012-07-21 ENCOUNTER — Ambulatory Visit (INDEPENDENT_AMBULATORY_CARE_PROVIDER_SITE_OTHER): Payer: No Typology Code available for payment source | Admitting: Internal Medicine

## 2012-07-21 ENCOUNTER — Encounter: Payer: Self-pay | Admitting: Licensed Clinical Social Worker

## 2012-07-21 VITALS — BP 112/75 | HR 76 | Temp 97.2°F | Ht 64.0 in | Wt 115.6 lb

## 2012-07-21 DIAGNOSIS — I10 Essential (primary) hypertension: Secondary | ICD-10-CM

## 2012-07-21 NOTE — Assessment & Plan Note (Addendum)
Followup for blood pressure recheck. Her blood pressure in the clinic today ,was 112/75 and had one more reading at home that showed blood pressure 122/76. Her dose of HCTZ was decreased from 25-12.5 with the last clinic appointment. Her blood pressures are under excellent control with 12.5 mg of HCTZ.  - Reviewing the trend, she may do fine without any anti- hypertensive meds, if she follows lifstyle modification with low salt intake and exercise. Continue to monitor for now on current dose.

## 2012-07-21 NOTE — Progress Notes (Signed)
Subjective:   Patient ID: Kimberly [name] female   DOB: 09-Jun-1955 57 y.o.   MRN: [id number]  HPI: 57 year old woman with past medical history significant for hypertension, GERD presents to the clinic for a followup on her blood pressure.  Patient was seen in the clinic about 1 to 2 weeks ago for low blood pressure ( noted at her psychiatrist appt) with 25 mg of HCTZ. Her BP in the clinic was 106/72 and her dose of HCTZ was decreased to half . She comes back today " I can feel the difference".She reports feeling much better. Denies any dizziness, headaches, numbness or blurry vision. She just had one more BP reading from home that showed blood pressure of 122/76.  She was in a hurry today as she had to do some errands.    Past Medical History  Diagnosis Date  . Gallstone   . Hyperlipidemia   . Hypertension   . Depression   . Anemia     macrocytic  . Asthma   . Menopausal syndrome   . GERD (gastroesophageal reflux disease)   . Tobacco abuse   . Allergy     rhinitis  . Alcohol abuse, in remission     since around 2005  . Otitis externa, acute Feb 2012    bilateral, treated with azithromycin after failure of neomycin drops  . Otitis media, acute Feb 2012  . Vaginal discharge 2011  . Callus of foot 2009  . Chest pain, musculoskeletal 2011  . Excessive cerumen in ear canal     since before 2000  . Cholelithiasis   . PONV (postoperative nausea and vomiting)   . Alcoholic hepatitis     fatty liver on imaging   Family History  Problem Relation Age of Onset  . Hypertension Mother    History   Social History  . Marital Status: Single    Spouse Name: N/A    Number of Children: N/A  . Years of Education: N/A   Occupational History  . Not on file.   Social History Main Topics  . Smoking status: Former Smoker    Types: Cigarettes    Quit date: 08/21/1995  . Smokeless tobacco: Not on file  . Alcohol Use: No     Comment: in remission since 2007  . Drug Use: No  . Sexually  Active: No   Other Topics Concern  . Not on file   Social History Narrative   2   Review of Systems: General: Denies fever, chills, diaphoresis, appetite change and fatigue. HEENT: Denies photophobia, eye pain, redness, hearing loss, ear pain, congestion, sore throat, rhinorrhea, sneezing, mouth sores, trouble swallowing, neck pain, neck stiffness and tinnitus. Respiratory: Denies SOB, DOE, cough, chest tightness, and wheezing. Cardiovascular: Denies to chest pain, palpitations and leg swelling. Gastrointestinal: Denies nausea, vomiting, abdominal pain, diarrhea, constipation, blood in stool and abdominal distention. Genitourinary: Denies dysuria, urgency, frequency, hematuria, flank pain and difficulty urinating. Musculoskeletal: Denies myalgias, back pain, joint swelling, arthralgias and gait problem.  Skin: Denies pallor, rash and wound. Neurological: Denies dizziness, seizures, syncope, weakness, light-headedness, numbness and headaches. Hematological: Denies adenopathy, easy bruising, personal or family bleeding history. Psychiatric/Behavioral: Denies suicidal ideation, mood changes, confusion, nervousness, sleep disturbance and agitation.    Current Outpatient Medications: Current Outpatient Prescriptions  Medication Sig Dispense Refill  . albuterol (PROVENTIL HFA;VENTOLIN HFA) 108 (90 BASE) MCG/ACT inhaler Inhale 2 puffs into the lungs every 4 (four) hours as needed for wheezing.  3 Inhaler  3  .  amitriptyline (ELAVIL) 25 MG tablet Take 1 tablet (25 mg total) by mouth at bedtime.  30 tablet  5  . ferrous sulfate 325 (65 FE) MG tablet Take 1 tablet (325 mg total) by mouth 2 (two) times daily with a meal.  90 tablet  3  . fluticasone (FLOVENT HFA) 110 MCG/ACT inhaler Inhale 2 puffs into the lungs 2 (two) times daily.  3 Inhaler  1  . hydrochlorothiazide (HYDRODIURIL) 25 MG tablet Take 0.5 tablets (12.5 mg total) by mouth daily.  90 tablet  1  . pantoprazole (PROTONIX) 40 MG tablet  Take 1 tablet (40 mg total) by mouth daily.  90 tablet  0   No current facility-administered medications for this visit.    Allergies: Allergies  Allergen Reactions  . Banana     Lip swelling  . Grape (Artificial) Flavor     Allergic to grapes-swelling   . Ibuprofen     REACTION: rash  . Penicillins     REACTION: rash      Objective:   Physical Exam: Filed Vitals:   07/21/12 0824  BP: 112/75  Pulse: 76  Temp: 97.2 F (36.2 C)    General: Vital signs reviewed and noted. Well-developed, well-nourished, in no acute distress; alert, appropriate and cooperative throughout examination. Head: Normocephalic, atraumatic Lungs: Normal respiratory effort. Clear to auscultation BL without crackles or wheezes. Heart: RRR. S1 and S2 normal without gallop, murmur, or rubs. Abdomen:BS normoactive. Soft, Nondistended, non-tender.  No masses or organomegaly. Extremities: No pretibial edema.     Assessment & Plan:

## 2012-07-21 NOTE — Patient Instructions (Addendum)
General Instructions: Please schedule a follow up appointment in 2 months . Please bring your medication bottles with your next appointment. Please take your medicines as prescribed.     Treatment Goals:  Goals (1 Years of Data) as of 07/21/12         As of Today 07/07/12 05/15/12 05/15/12 05/10/12     Blood Pressure    . Blood Pressure < 140/90  112/75 106/70 141/85 143/92 121/80      Progress Toward Treatment Goals:  Treatment Goal 07/21/2012  Blood pressure at goal    Self Care Goals & Plans:  Self Care Goal 07/21/2012  Manage my medications take my medicines as prescribed; bring my medications to every visit; refill my medications on time; follow the sick day instructions if I am sick  Monitor my health keep track of my blood pressure; keep track of my weight  Eat healthy foods eat more vegetables; eat fruit for snacks and desserts  Be physically active take a walk every day; find an activity I enjoy       Care Management & Community Referrals:  Referral 07/21/2012  Referrals made for care management support none needed  Referrals made to community resources -

## 2012-07-22 NOTE — Progress Notes (Signed)
Ms. Willison requesting to speak with CSW during scheduled [facility] appt.  Pt states continued difficulty scheduled [facility] medical transportation.  Ms. Crunk c/o being on hold for long time with music playing.  CSW encouraged pt to remain on hold and once staff returns transportation can be easily arranged. CSW reaffirmed significant wait time but no longer than 5 minutes, same for when CSW calls.  Pt requesting bus pass home.  Pt also requesting information on job search.  CSW provided Ms. Mangus with one way bus pass and information on [employer], employment program.  CSW informed pt on current status of Supportive Housing application.  Once received psychiatrist statement, pt application will be completed and will be faxed in.  Pt aware this housing program has a current wait list.  Pt maintains she continues to receives psychiatry services from [facility] and is aware of her next appt.  However, Ms. Cotterman declines therapy visit at this time.  CSW encouraged pt to maintain psychiatry.  Pt denies add'l needs at this time.

## 2012-08-20 ENCOUNTER — Telehealth: Payer: Self-pay | Admitting: Licensed Clinical Social Worker

## 2012-08-20 NOTE — Telephone Encounter (Signed)
CSW placed call to Ms. Nieman to discuss Therapist, nutritional.  Unfortunately, pt most likely would not qualify for admission into the program based on pt's current application and not having a severe mental illness.  CSW receive completed form from [place] which also states pt is not seriously mentally ill.  Pt inquired "Do I still need to keep going to that doctor [facility] psychiatrist)"? CSW encouraged Ms. Smock to continue receiving services at [facility] as they have prescribed medications for her, which pt states is beneficial.  CSW provided Ms. Jasso information to [employer] for additional housing resources.

## 2012-09-30 ENCOUNTER — Other Ambulatory Visit: Payer: Self-pay | Admitting: *Deleted

## 2012-09-30 DIAGNOSIS — I10 Essential (primary) hypertension: Secondary | ICD-10-CM

## 2012-09-30 DIAGNOSIS — F329 Major depressive disorder, single episode, unspecified: Secondary | ICD-10-CM

## 2012-09-30 MED ORDER — AMITRIPTYLINE HCL 25 MG PO TABS: 25.0000 mg | ORAL_TABLET | Freq: Every day | ORAL | Status: DC

## 2012-09-30 MED ORDER — HYDROCHLOROTHIAZIDE 25 MG PO TABS: 12.5000 mg | ORAL_TABLET | Freq: Every day | ORAL | Status: DC

## 2012-09-30 NOTE — Telephone Encounter (Signed)
Called to pharm 

## 2012-10-01 ENCOUNTER — Encounter: Payer: No Typology Code available for payment source | Admitting: Internal Medicine

## 2012-10-29 ENCOUNTER — Telehealth: Payer: Self-pay | Admitting: *Deleted

## 2012-10-29 NOTE — Telephone Encounter (Signed)
Pt calls c/o abd pain, "maybe a pulled muscle from when i had my gallbladder out"? Also c/o diarrhea, ear fullness and productive cough. appt set as requested by pt for 6/10

## 2012-11-04 ENCOUNTER — Ambulatory Visit (INDEPENDENT_AMBULATORY_CARE_PROVIDER_SITE_OTHER): Payer: No Typology Code available for payment source | Admitting: Internal Medicine

## 2012-11-04 ENCOUNTER — Encounter: Payer: Self-pay | Admitting: Internal Medicine

## 2012-11-04 ENCOUNTER — Other Ambulatory Visit: Payer: Self-pay | Admitting: *Deleted

## 2012-11-04 VITALS — BP 110/70 | HR 61 | Temp 97.3°F | Ht 64.0 in | Wt 111.6 lb

## 2012-11-04 DIAGNOSIS — J019 Acute sinusitis, unspecified: Secondary | ICD-10-CM

## 2012-11-04 DIAGNOSIS — B9689 Other specified bacterial agents as the cause of diseases classified elsewhere: Secondary | ICD-10-CM

## 2012-11-04 DIAGNOSIS — D509 Iron deficiency anemia, unspecified: Secondary | ICD-10-CM

## 2012-11-04 LAB — CBC
HCT: 37.4 % (ref 36.0–46.0)
Hemoglobin: 12.3 g/dL (ref 12.0–15.0)
MCH: 30 pg (ref 26.0–34.0)
MCV: 91.2 fL (ref 78.0–100.0)
RBC: 4.1 MIL/uL (ref 3.87–5.11)

## 2012-11-04 MED ORDER — PANTOPRAZOLE SODIUM 40 MG PO TBEC: 40.0000 mg | DELAYED_RELEASE_TABLET | Freq: Every day | ORAL | Status: DC

## 2012-11-04 MED ORDER — LEVOFLOXACIN 750 MG PO TABS: 750.0000 mg | ORAL_TABLET | Freq: Every day | ORAL | Status: DC

## 2012-11-04 NOTE — Assessment & Plan Note (Signed)
The patient has a history of microcytic anemia, with normal colonoscopy 04/2012, started on iron therapy 04/2012, which the patient states she has taken regularly. -recheck CBC today, to ensure appropriate rise in Hb

## 2012-11-04 NOTE — Progress Notes (Signed)
Case discussed with Dr. Brown immediately after the resident saw the patient. We reviewed the resident's history and exam and pertinent patient test results. I agree with the assessment, diagnosis and plan of care documented in the resident's note. 

## 2012-11-04 NOTE — Assessment & Plan Note (Signed)
Since symptoms have persisted > 10 days, and include rhinorrhea, congestion, and sinus tenderness, symptoms may be due to acute bacterial sinusitis.  Alternative diagnoses include viral sinusitis.  Patient is allergic to PCN. -prescribed levaquin 750 mg, 5-day course

## 2012-11-04 NOTE — Patient Instructions (Signed)
General Instructions: For your sinus symptoms, we are prescribing Levaquin.  Take 1 tablet, once per day for 5 days.  We have included a coupon to help you pay for this medication.  Please return for a follow-up visit in 3 months.   Treatment Goals:  Goals (1 Years of Data) as of 11/04/12         As of Today 07/21/12 07/07/12 05/15/12 05/15/12     Blood Pressure    . Blood Pressure < 140/90  110/70 112/75 106/70 141/85 143/92      Progress Toward Treatment Goals:  Treatment Goal 11/04/2012  Blood pressure at goal    Self Care Goals & Plans:  Self Care Goal 11/04/2012  Manage my medications take my medicines as prescribed; bring my medications to every visit  Monitor my health -  Eat healthy foods drink diet soda or water instead of juice or soda; eat more vegetables; eat foods that are low in salt  Be physically active -       Care Management & Community Referrals:  Referral 11/04/2012  Referrals made for care management support none needed  Referrals made to community resources -

## 2012-11-04 NOTE — Progress Notes (Signed)
HPI The patient is a 57 y.o. female with a history of   The patient notes a 2-3 week history of rhinorrhea, nasal congestion congestion, cough initially productive of yellow sputum though now non-productive, ear "fullness", and sinus pressure.  She notes no fevers, sore throat, or sick contacts.  Symptoms have persisted, causing her to seek medical attention.  The patient has a history of microcytic anemia, with a ferritin of 4 in 2013.  Colonoscopy 04/2012 unremarkable.  Patient was started on iron supplementation 04/2012.  ROS: General: no fevers, chills, changes in weight, changes in appetite Skin: no rash HEENT: see HPI Pulm: no dyspnea, wheezing CV: no chest pain, palpitations, shortness of breath Abd: no abdominal pain, nausea/vomiting, diarrhea/constipation GU: no dysuria, hematuria, polyuria Ext: no arthralgias, myalgias Neuro: no weakness, numbness, or tingling  Filed Vitals:   11/04/12 0815  BP: 110/70  Pulse: 61  Temp: 97.3 F (36.3 C)    PEX General: alert, cooperative, and in no apparent distress HEENT: PERRL, EOMI, oropharynx non-erythematous, bilateral tympanic membranes unremarkable, maxillary and frontal sinus tenderness to palpation Neck: supple, no lymphadenopathy Lungs: clear to ascultation bilaterally, normal work of respiration, no wheezes, rales, ronchi Heart: regular rate and rhythm, no murmurs, gallops, or rubs Abdomen: soft, non-tender, non-distended, normal bowel sounds Extremities: no cyanosis, clubbing, or edema Neurologic: alert & oriented X3, cranial nerves II-XII intact, strength grossly intact, sensation intact to light touch  Current Outpatient Prescriptions on File Prior to Visit  Medication Sig Dispense Refill  . albuterol (PROVENTIL HFA;VENTOLIN HFA) 108 (90 BASE) MCG/ACT inhaler Inhale 2 puffs into the lungs every 4 (four) hours as needed for wheezing.  3 Inhaler  3  . amitriptyline (ELAVIL) 25 MG tablet Take 1 tablet (25 mg total) by mouth  at bedtime.  30 tablet  5  . ferrous sulfate 325 (65 FE) MG tablet Take 1 tablet (325 mg total) by mouth 2 (two) times daily with a meal.  90 tablet  3  . fluticasone (FLOVENT HFA) 110 MCG/ACT inhaler Inhale 2 puffs into the lungs 2 (two) times daily.  3 Inhaler  1  . hydrochlorothiazide (HYDRODIURIL) 25 MG tablet Take 0.5 tablets (12.5 mg total) by mouth daily.  90 tablet  1  . pantoprazole (PROTONIX) 40 MG tablet Take 1 tablet (40 mg total) by mouth daily.  90 tablet  0   No current facility-administered medications on file prior to visit.    Assessment/Plan

## 2012-12-03 ENCOUNTER — Ambulatory Visit (INDEPENDENT_AMBULATORY_CARE_PROVIDER_SITE_OTHER): Payer: No Typology Code available for payment source | Admitting: Internal Medicine

## 2012-12-03 ENCOUNTER — Encounter: Payer: Self-pay | Admitting: Internal Medicine

## 2012-12-03 VITALS — BP 104/70 | HR 76 | Temp 96.7°F | Wt 112.1 lb

## 2012-12-03 DIAGNOSIS — F32A Depression, unspecified: Secondary | ICD-10-CM

## 2012-12-03 DIAGNOSIS — F329 Major depressive disorder, single episode, unspecified: Secondary | ICD-10-CM

## 2012-12-03 DIAGNOSIS — F3289 Other specified depressive episodes: Secondary | ICD-10-CM

## 2012-12-03 DIAGNOSIS — J309 Allergic rhinitis, unspecified: Secondary | ICD-10-CM

## 2012-12-03 DIAGNOSIS — J45909 Unspecified asthma, uncomplicated: Secondary | ICD-10-CM

## 2012-12-03 DIAGNOSIS — D509 Iron deficiency anemia, unspecified: Secondary | ICD-10-CM

## 2012-12-03 DIAGNOSIS — J4530 Mild persistent asthma, uncomplicated: Secondary | ICD-10-CM

## 2012-12-03 DIAGNOSIS — I10 Essential (primary) hypertension: Secondary | ICD-10-CM

## 2012-12-03 DIAGNOSIS — K219 Gastro-esophageal reflux disease without esophagitis: Secondary | ICD-10-CM

## 2012-12-03 MED ORDER — AMITRIPTYLINE HCL 25 MG PO TABS: 25.0000 mg | ORAL_TABLET | Freq: Every day | ORAL | Status: DC

## 2012-12-03 MED ORDER — FLUTICASONE PROPIONATE HFA 110 MCG/ACT IN AERO: 2.0000 | INHALATION_SPRAY | Freq: Two times a day (BID) | RESPIRATORY_TRACT | Status: DC

## 2012-12-03 MED ORDER — ALBUTEROL SULFATE HFA 108 (90 BASE) MCG/ACT IN AERS: 2.0000 | INHALATION_SPRAY | RESPIRATORY_TRACT | Status: DC | PRN

## 2012-12-03 MED ORDER — PANTOPRAZOLE SODIUM 40 MG PO TBEC: 40.0000 mg | DELAYED_RELEASE_TABLET | Freq: Every day | ORAL | Status: DC

## 2012-12-03 NOTE — Assessment & Plan Note (Signed)
Stable with good mood and without SI.  Followed at mental health (psychiatry).  Takes amitriptyline 25 qHS and prozac 40 (prozac prescribed at mental health, amitriptyline refilled today per patient request).

## 2012-12-03 NOTE — Assessment & Plan Note (Signed)
Stable, but occasionally feels congested. Not today.  Have advised nasal saline.  If recurs/persists, may also consider flonase.

## 2012-12-03 NOTE — Assessment & Plan Note (Signed)
BP Readings from Last 3 Encounters:  12/03/12 104/70  11/04/12 110/70  07/21/12 112/75    Lab Results  Component Value Date   NA 140 05/09/2012   K 4.1 05/09/2012   CREATININE 0.64 05/09/2012    Assessment: Blood pressure control: controlled Progress toward BP goal:  at goal  Plan: Medications:  Discontinue HCTZ 12.5; pressures have been well controlled and even low on this medication; she has instituted a low salt diet; recheck BP in 3 months and restart HCTZ 12.5 if needed. Educational resources provided: brochure;video Self management tools provided: home blood pressure logbook

## 2012-12-03 NOTE — Assessment & Plan Note (Signed)
No recent exacerbations.  No frequent use of albuterol.   Lungs clear on exam. Albuterol and flovent refilled.

## 2012-12-03 NOTE — Assessment & Plan Note (Signed)
Hb 12.3 & MCV 91 on 11/04/12.  Will discontinue iron supplementation at this time, and recheck CBC in 3 months.  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

## 2012-12-03 NOTE — Progress Notes (Signed)
Subjective:   Patient ID: Kimberly [name] female   DOB: Nov 03, 1955 57 y.o.   MRN: [id number]  HPI: [name] is a 57 y.o. F with history of HTN & Depression who presents today for routine follow up.  She was most recently seen in clinic on 11/04/12 for cute bacterial sinusitis, but she never completed a 5d course of levaquin as she could not afford medication, but symptoms have subsequently resolved.   Denies heart palpitations, dizziness, lightheadedness.      She has no complaints and is here for medication refills only.  Review of Systems: Constitutional: Denies fever, chills, diaphoresis, appetite change and fatigue.  HEENT: Denies photophobia, eye pain, redness, hearing loss, ear pain, sore throat, rhinorrhea, sneezing, mouth sores, trouble swallowing, neck pain, neck stiffness and tinnitus.  Respiratory: Denies SOB, DOE, cough, chest tightness, and wheezing.  Cardiovascular: Denies chest pain, palpitations and leg swelling.  Gastrointestinal: Denies nausea, vomiting, abdominal pain, diarrhea, constipation,blood in stool and abdominal distention.  Genitourinary: Denies dysuria, urgency, frequency, hematuria, flank pain and difficulty urinating.  Musculoskeletal: Denies myalgias, back pain, joint swelling, arthralgias and gait problem.  Skin: Denies pallor, rash and wound.  Neurological: Denies dizziness, syncope, weakness, lightheadedness, numbness and headaches.  Psychiatric/Behavioral: reports good mood since being followed by psychiatry - has been started on prozac    Past Medical History  Diagnosis Date  . Cholelithiasis     [place] 09/2008: Cholelithiasis is present  . Hyperlipidemia   . Hypertension   . Depression   . Anemia     EGD with duodenal AVM, normal colonoscopy 04/2012  . Asthma   . Menopausal syndrome   . GERD (gastroesophageal reflux disease)   . Tobacco abuse   . Allergy     rhinitis  . Alcohol abuse, in remission     since around 2005  . Otitis  externa, acute Feb 2012    bilateral, treated with azithromycin after failure of neomycin drops  . Otitis media, acute Feb 2012  . Vaginal discharge 2011  . Callus of foot 2009  . Chest pain, musculoskeletal 2011  . Excessive cerumen in ear canal     since before 2000  . Cholelithiasis   . PONV (postoperative nausea and vomiting)   . Alcoholic hepatitis     fatty liver on imaging   Current Outpatient Prescriptions  Medication Sig Dispense Refill  . albuterol (PROVENTIL HFA;VENTOLIN HFA) 108 (90 BASE) MCG/ACT inhaler Inhale 2 puffs into the lungs every 4 (four) hours as needed for wheezing.  3 Inhaler  3  . amitriptyline (ELAVIL) 25 MG tablet Take 1 tablet (25 mg total) by mouth at bedtime.  30 tablet  5  . ferrous sulfate 325 (65 FE) MG tablet Take 1 tablet (325 mg total) by mouth 2 (two) times daily with a meal.  90 tablet  3  . fluticasone (FLOVENT HFA) 110 MCG/ACT inhaler Inhale 2 puffs into the lungs 2 (two) times daily.  3 Inhaler  1  . hydrochlorothiazide (HYDRODIURIL) 25 MG tablet Take 0.5 tablets (12.5 mg total) by mouth daily.  90 tablet  1  . levofloxacin (LEVAQUIN) 750 MG tablet Take 1 tablet (750 mg total) by mouth daily.  5 tablet  0  . pantoprazole (PROTONIX) 40 MG tablet Take 1 tablet (40 mg total) by mouth daily.  90 tablet  0   No current facility-administered medications for this visit.   Family History  Problem Relation Age of Onset  . Hypertension Mother  History   Social History  . Marital Status: Single    Spouse Name: N/A    Number of Children: N/A  . Years of Education: N/A   Social History Main Topics  . Smoking status: Former Smoker    Types: Cigarettes    Quit date: 08/21/1995  . Smokeless tobacco: Not on file  . Alcohol Use: No     Comment: in remission since 2007  . Drug Use: No  . Sexually Active: No   Other Topics Concern  . Not on file   Social History Narrative   2    Objective:  Physical Exam: Filed Vitals:   12/03/12 1312    BP: 104/70  Pulse: 76  Temp: 96.7 F (35.9 C)  Weight: 112 lb 1.6 oz (50.848 kg)  SpO2: 100%   General: no acute distress, well nourished, pleasant HEENT: PERRL, EOMI, no scleral icterus Cardiac: RRR, no rubs, murmurs or gallops Pulm: clear to auscultation bilaterally, moving normal volumes of air Abd: soft, nontender, nondistended, BS normoactive Ext: warm and well perfused, no pedal edema Neuro: alert and oriented X3, cranial nerves II-XII grossly intact  Assessment & Plan:   Case and care discussed with Dr. Dalphine Handing.  Please see problem oriented charting for further details. Patient to return in 3 months for blood pressure check and CBC to follow up on iron def anemia.

## 2012-12-03 NOTE — Assessment & Plan Note (Signed)
Refilled protonix.  Patient feels symptomatic (indigestion) if she does not take protonix.  EGD in 04/2012 only significant for duodenal AVM.

## 2012-12-03 NOTE — Patient Instructions (Signed)
General Instructions: -You are doing great! Your blood pressure looks so good now that you've decreased your salt intake, that we are going to STOP your hydrochlorothiazide for 3 months and recheck your pressure at that time.  Continue your low salt diet.  -At your last visit, your red blood cell count looked good - we will STOP your iron for 3 months, and recheck your blood work again next time.  -If you feel any more nasal congestion, try to use over the counter nasal saline as needed.  Please be sure to bring all of your medications with you to every visit.  Should you have any new or worsening symptoms, please be sure to call the clinic at [phone].   Treatment Goals:  Goals (1 Years of Data) as of 12/03/12         As of Today 11/04/12 07/21/12 07/07/12 05/15/12     Blood Pressure    . Blood Pressure < 140/90  104/70 110/70 112/75 106/70 141/85      Progress Toward Treatment Goals:  Treatment Goal 12/03/2012  Blood pressure at goal    Self Care Goals & Plans:  Self Care Goal 12/03/2012  Manage my medications take my medicines as prescribed; refill my medications on time  Monitor my health bring my blood pressure log to each visit; keep track of my weight  Eat healthy foods eat foods that are low in salt; eat baked foods instead of fried foods  Be physically active take a walk every day; find time in my schedule  Meeting treatment goals maintain the current self-care plan     Care Management & Community Referrals:  Referral 12/03/2012  Referrals made for care management support none needed  Referrals made to community resources -

## 2012-12-03 NOTE — Progress Notes (Signed)
Case discussed with Dr. Sharda soon after the resident saw the patient.  We reviewed the resident's history and exam and pertinent patient test results.  I agree with the assessment, diagnosis, and plan of care documented in the resident's note. 

## 2012-12-08 ENCOUNTER — Ambulatory Visit: Payer: Self-pay

## 2012-12-09 ENCOUNTER — Ambulatory Visit: Payer: Self-pay

## 2013-02-24 ENCOUNTER — Telehealth: Payer: Self-pay | Admitting: Licensed Clinical Social Worker

## 2013-02-24 NOTE — Telephone Encounter (Signed)
Kimberly [name] placed call to CSW requesting a return bus pass for her scheduled [facility] appt on 10/1. Pt states she was recently contacted regarding the appt and attempted to obtain transportation through [facility] but was told the schedule was full.  CSW informed Kimberly [name] to continue to utilize [employer] with notice and CSW will provide one way bus pass return home for 10/1.  Left at front office for pt.

## 2013-02-25 ENCOUNTER — Encounter: Payer: Self-pay | Admitting: Internal Medicine

## 2013-03-11 ENCOUNTER — Encounter: Payer: Self-pay | Admitting: Internal Medicine

## 2013-03-25 ENCOUNTER — Encounter: Payer: Self-pay | Admitting: Internal Medicine

## 2013-03-25 ENCOUNTER — Ambulatory Visit (INDEPENDENT_AMBULATORY_CARE_PROVIDER_SITE_OTHER): Payer: No Typology Code available for payment source | Admitting: Internal Medicine

## 2013-03-25 VITALS — BP 119/82 | HR 75 | Temp 97.2°F | Wt 109.6 lb

## 2013-03-25 DIAGNOSIS — Z0000 Encounter for general adult medical examination without abnormal findings: Secondary | ICD-10-CM | POA: Insufficient documentation

## 2013-03-25 DIAGNOSIS — E785 Hyperlipidemia, unspecified: Secondary | ICD-10-CM

## 2013-03-25 DIAGNOSIS — Z23 Encounter for immunization: Secondary | ICD-10-CM

## 2013-03-25 DIAGNOSIS — D509 Iron deficiency anemia, unspecified: Secondary | ICD-10-CM

## 2013-03-25 DIAGNOSIS — I10 Essential (primary) hypertension: Secondary | ICD-10-CM

## 2013-03-25 LAB — CBC
HCT: 38.8 % (ref 36.0–46.0)
MCHC: 32.7 g/dL (ref 30.0–36.0)
MCV: 91.7 fL (ref 78.0–100.0)
RDW: 13.4 % (ref 11.5–15.5)

## 2013-03-25 LAB — BASIC METABOLIC PANEL
BUN: 7 mg/dL (ref 6–23)
Chloride: 95 mEq/L — ABNORMAL LOW (ref 96–112)
Creat: 0.76 mg/dL (ref 0.50–1.10)

## 2013-03-25 MED ORDER — HYDROCHLOROTHIAZIDE 25 MG PO TABS: 25.0000 mg | ORAL_TABLET | Freq: Every day | ORAL | Status: DC

## 2013-03-25 NOTE — Assessment & Plan Note (Signed)
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 No further dark tarry/bloody stools. Check CBC and ferritin.

## 2013-03-25 NOTE — Progress Notes (Signed)
Subjective:   Patient ID: CANYON WILLOW female   DOB: June 09, 1955 57 y.o.   MRN: [id number]  HPI: Ms.Reshanda A Majer is a 57 y.o. F with history of HTN & Depression who presents today for routine follow up. She was most recently seen in clinic on 12/03/12 for routine follow up.  She has no complaints, and specifically denies dizziness, lightheadedness, dark tarry stools (was admitted in 04/2012 with iron def anemia due to GI bleed - duodenal avm).    She requests refill of HCTZ which had been d/c in the past, but BPs at [facility] were elevated, so med restarted.   Review of Systems:  Constitutional: Denies fever, chills, diaphoresis, appetite change and fatigue.  HEENT: Denies photophobia, eye pain, redness, hearing loss, ear pain, sore throat, rhinorrhea, sneezing, mouth sores, trouble swallowing, neck pain, neck stiffness and tinnitus.  Respiratory: Denies SOB, DOE, cough, chest tightness, and wheezing.  Cardiovascular: Denies chest pain, palpitations and leg swelling.  Gastrointestinal: Denies nausea, vomiting, abdominal pain, diarrhea, constipation,blood in stool and abdominal distention.  Genitourinary: Denies dysuria, urgency, frequency, hematuria, flank pain and difficulty urinating.  Musculoskeletal: Denies myalgias, back pain, joint swelling, arthralgias and gait problem.  Skin: Denies pallor, rash and wound.  Neurological: Denies dizziness, syncope, weakness, lightheadedness, numbness and headaches.  Psychiatric/Behavioral: reports good mood since being followed by psychiatry - continues prozac and amitriptyline    Past Medical History  Diagnosis Date  . Cholelithiasis     [place] 09/2008: Cholelithiasis is present, s/p cholecystectomy  . Hyperlipidemia   . Hypertension   . Depression   . Anemia     EGD with duodenal AVM, normal colonoscopy 04/2012  . Asthma   . Menopausal syndrome     Treated with estradiol in the past - discontinued 04/2012  . GERD (gastroesophageal reflux  disease)   . Allergic rhinitis   . Alcohol abuse, in remission     since around 2005  . Otitis externa, acute Feb 2012    bilateral, treated with azithromycin after failure of neomycin drops  . Otitis media, acute Feb 2012  . Callus of foot 2009  . Chest pain, musculoskeletal 2011  . Excessive cerumen in ear canal     since before 2000  . PONV (postoperative nausea and vomiting)   . Alcoholic hepatitis     fatty liver on imaging   Current Outpatient Prescriptions  Medication Sig Dispense Refill  . albuterol (PROVENTIL HFA;VENTOLIN HFA) 108 (90 BASE) MCG/ACT inhaler Inhale 2 puffs into the lungs every 4 (four) hours as needed for wheezing.  3 Inhaler  11  . amitriptyline (ELAVIL) 25 MG tablet Take 1 tablet (25 mg total) by mouth at bedtime.  30 tablet  5  . FLUoxetine (PROZAC) 20 MG capsule Take 40 mg by mouth daily. Seeing psychiatry at mental health.      . fluticasone (FLOVENT HFA) 110 MCG/ACT inhaler Inhale 2 puffs into the lungs 2 (two) times daily.  3 Inhaler  11  . pantoprazole (PROTONIX) 40 MG tablet Take 1 tablet (40 mg total) by mouth daily.  90 tablet  3   No current facility-administered medications for this visit.   Family History  Problem Relation Age of Onset  . Hypertension Mother    History   Social History  . Marital Status: Single    Spouse Name: N/A    Number of Children: N/A  . Years of Education: N/A   Social History Main Topics  . Smoking status: Former Smoker  Types: Cigarettes    Quit date: 08/21/1995  . Smokeless tobacco: None  . Alcohol Use: No     Comment: in remission since 2007  . Drug Use: No  . Sexual Activity: No   Other Topics Concern  . None   Social History Narrative   2    Objective:  Physical Exam: Filed Vitals:   03/25/13 1322  BP: 119/82  Pulse: 75  Temp: 97.2 F (36.2 C)  TempSrc: Oral  Weight: 109 lb 9.6 oz (49.714 kg)  SpO2: 100%   General: no acute distress, well nourished, pleasant  HEENT: PERRL, EOMI, no  scleral icterus, no conjunctival pallor Cardiac: RRR, no rubs, murmurs or gallops  Pulm: clear to auscultation bilaterally, moving normal volumes of air  Abd: soft, nontender, nondistended, BS normoactive  Ext: warm and well perfused, no pedal edema  Neuro: alert and oriented X3, cranial nerves II-XII grossly intact  Assessment & Plan:  Case and care discussed with Dr. Dalphine Handing.  Please see problem oriented charting for further details. Patient to return in 6 months for routine f/u.

## 2013-03-25 NOTE — Assessment & Plan Note (Signed)
Flu shot today 

## 2013-03-25 NOTE — Assessment & Plan Note (Signed)
Lipids next visit

## 2013-03-25 NOTE — Assessment & Plan Note (Signed)
BP Readings from Last 3 Encounters:  03/25/13 119/82  12/03/12 104/70  11/04/12 110/70    Lab Results  Component Value Date   NA 140 05/09/2012   K 4.1 05/09/2012   CREATININE 0.64 05/09/2012    Assessment: Blood pressure control: controlled Progress toward BP goal:  at goal  Plan: Medications:  HCTZ restarted due to elevated BPs at [facility]; refilled HCTZ 25mg  daily; BMET today Educational resources provided: brochure Self management tools provided: home blood pressure logbook

## 2013-03-25 NOTE — Patient Instructions (Signed)
General Instructions:  -You are doing great!  I have sent in refills of your hydrochlorothiazide - let me know if you experience any dizziness or light headedness  -Today I am checking your blood count and your kidney function  Please be sure to bring all of your medications with you to every visit.  Should you have any new or worsening symptoms, please be sure to call the clinic at [phone].  Treatment Goals:  Goals (1 Years of Data) as of 03/25/13         As of Today 12/03/12 11/04/12 07/21/12 07/07/12     Blood Pressure    . Blood Pressure < 140/90  119/82 104/70 110/70 112/75 106/70     Progress Toward Treatment Goals:  Treatment Goal 03/25/2013  Blood pressure at goal    Self Care Goals & Plans:  Self Care Goal 03/25/2013  Manage my medications take my medicines as prescribed; refill my medications on time  Monitor my health keep track of my blood pressure  Eat healthy foods eat foods that are low in salt; eat baked foods instead of fried foods  Be physically active find an activity I enjoy  Meeting treatment goals maintain the current self-care plan    Care Management & Community Referrals:  Referral 12/03/2012  Referrals made for care management support none needed  Referrals made to community resources -

## 2013-03-27 NOTE — Progress Notes (Signed)
Case discussed with Dr. Sharda at the time of the visit.  We reviewed the resident's history and exam and pertinent patient test results.  I agree with the assessment, diagnosis, and plan of care documented in the resident's note.    

## 2013-05-25 ENCOUNTER — Telehealth: Payer: Self-pay | Admitting: *Deleted

## 2013-05-25 MED ORDER — ESOMEPRAZOLE MAGNESIUM 20 MG PO CPDR: 20.0000 mg | DELAYED_RELEASE_CAPSULE | Freq: Every day | ORAL | Status: DC

## 2013-05-25 NOTE — Telephone Encounter (Signed)
I changed the Rx to nexium 20 mg po daily.

## 2013-05-25 NOTE — Telephone Encounter (Signed)
Fax from [facility] - Protonix is no longer available for free; consider writing a new rx for Nexium which is available? Thanks

## 2013-06-17 ENCOUNTER — Ambulatory Visit: Payer: Self-pay

## 2013-06-19 ENCOUNTER — Ambulatory Visit: Payer: Self-pay

## 2013-08-14 ENCOUNTER — Other Ambulatory Visit: Payer: Self-pay | Admitting: *Deleted

## 2013-08-14 MED ORDER — ESOMEPRAZOLE MAGNESIUM 20 MG PO CPDR: 20.0000 mg | DELAYED_RELEASE_CAPSULE | Freq: Every day | ORAL | Status: DC

## 2013-08-14 NOTE — Telephone Encounter (Signed)
Rx faxed in.

## 2013-08-14 NOTE — Telephone Encounter (Signed)
Pl sch appt with Dr Burnard Bunting before she leaves.

## 2013-10-15 ENCOUNTER — Encounter: Payer: Self-pay | Admitting: Internal Medicine

## 2013-10-15 ENCOUNTER — Ambulatory Visit (INDEPENDENT_AMBULATORY_CARE_PROVIDER_SITE_OTHER): Payer: No Typology Code available for payment source | Admitting: Internal Medicine

## 2013-10-15 VITALS — BP 113/78 | HR 74 | Temp 98.7°F | Wt 118.7 lb

## 2013-10-15 DIAGNOSIS — I10 Essential (primary) hypertension: Secondary | ICD-10-CM

## 2013-10-15 DIAGNOSIS — D509 Iron deficiency anemia, unspecified: Secondary | ICD-10-CM

## 2013-10-15 DIAGNOSIS — J45909 Unspecified asthma, uncomplicated: Secondary | ICD-10-CM

## 2013-10-15 DIAGNOSIS — E785 Hyperlipidemia, unspecified: Secondary | ICD-10-CM

## 2013-10-15 DIAGNOSIS — Z0000 Encounter for general adult medical examination without abnormal findings: Secondary | ICD-10-CM

## 2013-10-15 DIAGNOSIS — Z1239 Encounter for other screening for malignant neoplasm of breast: Secondary | ICD-10-CM

## 2013-10-15 LAB — CBC
HEMATOCRIT: 37.2 % (ref 36.0–46.0)
HEMOGLOBIN: 12.2 g/dL (ref 12.0–15.0)
MCH: 29.5 pg (ref 26.0–34.0)
MCHC: 32.8 g/dL (ref 30.0–36.0)
MCV: 90.1 fL (ref 78.0–100.0)
Platelets: 323 10*3/uL (ref 150–400)
RBC: 4.13 MIL/uL (ref 3.87–5.11)
RDW: 13.2 % (ref 11.5–15.5)
WBC: 5.5 10*3/uL (ref 4.0–10.5)

## 2013-10-15 MED ORDER — ALBUTEROL SULFATE HFA 108 (90 BASE) MCG/ACT IN AERS
2.0000 | INHALATION_SPRAY | RESPIRATORY_TRACT | Status: DC | PRN
Start: 2013-10-15 — End: 2014-06-16

## 2013-10-15 MED ORDER — FLUTICASONE PROPIONATE HFA 110 MCG/ACT IN AERO: 2.0000 | INHALATION_SPRAY | Freq: Two times a day (BID) | RESPIRATORY_TRACT | Status: DC

## 2013-10-15 NOTE — Assessment & Plan Note (Signed)
No recent exacerbations.  No frequent use of albuterol.  Lungs clear.  Refilled albuterol and flovent.

## 2013-10-15 NOTE — Progress Notes (Signed)
Subjective:   Patient ID: Kimberly [name] female   DOB: Aug 20, 1955 58 y.o.   MRN: [id number]   HPI: [name] is a 58 y.o. woman with h/o HTN, asthma, microcytic anemia (d/t duodenal AVM GIB in 04/2012) and GERD who presents for routine.    She has no complaints. Last seen on 03/25/13 for routine.  She denies dizziness, light headedness, dark tarry stools.  Hasn't had to use albuterol, using flovent BID.  No recent asthma exacerbation.    No longer taking Fe supplementation.    Review of Systems: Constitutional: Denies fever, chills, diaphoresis, appetite change and fatigue.  HEENT: Denies photophobia, eye pain, redness, hearing loss, ear pain, congestion, sore throat, rhinorrhea, sneezing, mouth sores, trouble swallowing, neck pain, neck stiffness and tinnitus.  Respiratory: Denies SOB, DOE, cough, chest tightness, and wheezing.  Cardiovascular: Denies chest pain, palpitations and leg swelling.  Gastrointestinal: Denies nausea, vomiting, abdominal pain, diarrhea, constipation,blood in stool and abdominal distention.  Genitourinary: Denies dysuria, urgency, frequency, hematuria, flank pain and difficulty urinating.  Musculoskeletal: Denies myalgias, back pain, joint swelling, arthralgias and gait problem.  Skin: Denies pallor, rash and wound.  Neurological: Denies dizziness, seizures, syncope, weakness, lightheadedness, numbness and headaches.  Hematological: No s/s of bleeding   Past Medical History  Diagnosis Date  . Cholelithiasis     [place] 09/2008: Cholelithiasis is present, s/p cholecystectomy  . Hyperlipidemia   . Hypertension   . Depression   . Anemia     EGD with duodenal AVM, normal colonoscopy 04/2012  . Asthma   . Menopausal syndrome     Treated with estradiol in the past - discontinued 04/2012  . GERD (gastroesophageal reflux disease)   . Allergic rhinitis   . Alcohol abuse, in remission     since around 2005  . Otitis externa, acute Feb 2012   bilateral, treated with azithromycin after failure of neomycin drops  . Otitis media, acute Feb 2012  . Callus of foot 2009  . Chest pain, musculoskeletal 2011  . Excessive cerumen in ear canal     since before 2000  . PONV (postoperative nausea and vomiting)   . Alcoholic hepatitis     fatty liver on imaging   Current Outpatient Prescriptions  Medication Sig Dispense Refill  . albuterol (PROVENTIL HFA;VENTOLIN HFA) 108 (90 BASE) MCG/ACT inhaler Inhale 2 puffs into the lungs every 4 (four) hours as needed for wheezing.  3 Inhaler  11  . amitriptyline (ELAVIL) 25 MG tablet Take 1 tablet (25 mg total) by mouth at bedtime.  30 tablet  5  . esomeprazole (NEXIUM) 20 MG capsule Take 1 capsule (20 mg total) by mouth daily.  30 capsule  11  . FLUoxetine (PROZAC) 20 MG capsule Take 40 mg by mouth daily. Seeing psychiatry at mental [facility].      . fluticasone (FLOVENT HFA) 110 MCG/ACT inhaler Inhale 2 puffs into the lungs 2 (two) times daily.  3 Inhaler  11  . hydrochlorothiazide (HYDRODIURIL) 25 MG tablet Take 1 tablet (25 mg total) by mouth daily.  90 tablet  3   No current facility-administered medications for this visit.   Family History  Problem Relation Age of Onset  . Hypertension Mother    History   Social History  . Marital Status: Single    Spouse Name: N/A    Number of Children: N/A  . Years of Education: N/A   Social History Main Topics  . Smoking status: Former Smoker    Types:  Cigarettes    Quit date: 08/21/1995  . Smokeless tobacco: Not on file  . Alcohol Use: No     Comment: in remission since 2007  . Drug Use: No  . Sexual Activity: No   Other Topics Concern  . Not on file   Social History Narrative   2    Objective:  Physical Exam: Filed Vitals:   10/15/13 1309  BP: 113/78  Pulse: 74  Weight: 118 lb 11.2 oz (53.842 kg)  SpO2: 99%   General: no acute distress, well nourished, pleasant  HEENT: PERRL, EOMI, no scleral icterus, no conjunctival pallor    Cardiac: RRR, no rubs, murmurs or gallops  Pulm: clear to auscultation bilaterally, moving normal volumes of air  Abd: soft, nontender, nondistended, BS normoactive  Ext: warm and well perfused, no pedal edema  Neuro: alert and oriented X3, cranial nerves II-XII grossly intact  Assessment & Plan:  Case and care discussed with Dr. Marinda Elk.  Please see problem oriented charting for further details. Patient to return in 3-6 months for routine BP follow up.

## 2013-10-15 NOTE — Patient Instructions (Addendum)
General Instructions: - You are doing GREAT with your blood pressure!!! Keep it up!!!!  - Today, I am checking your iron level, red blood cells, kidney function and cholesterol level  - I refilled your albuterol and flovent as well  Thank you for bringing your medicines today. This helps [place] keep you safe from mistakes.  Please be sure to bring all of your medications with you to every visit.  Should you have any new or worsening symptoms, please be sure to call the clinic at [phone].   Progress Toward Treatment Goals:  Treatment Goal 10/15/2013  Blood pressure at goal    Self Care Goals & Plans:  Self Care Goal 10/15/2013  Manage my medications take my medicines as prescribed; refill my medications on time; bring my medications to every visit  Monitor my health keep track of my blood pressure  Eat healthy foods eat foods that are low in salt; eat baked foods instead of fried foods  Be physically active find an activity I enjoy  Meeting treatment goals maintain the current self-care plan    Care Management & Community Referrals:  Referral 10/15/2013  Referrals made for care management support none needed  Referrals made to community resources -

## 2013-10-15 NOTE — Assessment & Plan Note (Signed)
No further s/s of anemia.  Fe supp d/c 12/03/12 (Fe def anemia d/t duodenal AVM 04/2012).   Check CBC & Ferritin

## 2013-10-15 NOTE — Assessment & Plan Note (Signed)
Referral placed for screening mammogram

## 2013-10-15 NOTE — Assessment & Plan Note (Addendum)
Lipid panel today.  ADDENDUM: 10 year risk 1%, with 2 risk factors, goal LDL <130, LDL 122 - lifestyle modifications, recheck next year.

## 2013-10-15 NOTE — Assessment & Plan Note (Signed)
BP Readings from Last 3 Encounters:  10/15/13 113/78  03/25/13 119/82  12/03/12 104/70    Lab Results  Component Value Date   NA 135 03/25/2013   K 3.8 03/25/2013   CREATININE 0.76 03/25/2013    Assessment: Blood pressure control: controlled Progress toward BP goal:  at goal  Plan: Medications:  continue current medications - hctz 25 daily Educational resources provided: brochure Self management tools provided: home blood pressure logbook Other plans: CMET today

## 2013-10-16 LAB — COMPREHENSIVE METABOLIC PANEL
ALBUMIN: 4.1 g/dL (ref 3.5–5.2)
ALT: 14 U/L (ref 0–35)
AST: 26 U/L (ref 0–37)
Alkaline Phosphatase: 76 U/L (ref 39–117)
BUN: 8 mg/dL (ref 6–23)
CALCIUM: 9.3 mg/dL (ref 8.4–10.5)
CHLORIDE: 99 meq/L (ref 96–112)
CO2: 33 meq/L — AB (ref 19–32)
CREATININE: 0.76 mg/dL (ref 0.50–1.10)
Glucose, Bld: 86 mg/dL (ref 70–99)
POTASSIUM: 3.8 meq/L (ref 3.5–5.3)
Sodium: 138 mEq/L (ref 135–145)
Total Bilirubin: 0.4 mg/dL (ref 0.2–1.2)
Total Protein: 6.9 g/dL (ref 6.0–8.3)

## 2013-10-16 LAB — LIPID PANEL
CHOLESTEROL: 219 mg/dL — AB (ref 0–200)
HDL: 64 mg/dL (ref >39)
LDL Cholesterol: 122 mg/dL — ABNORMAL HIGH (ref 0–99)
Total CHOL/HDL Ratio: 3.4 Ratio
Triglycerides: 167 mg/dL — ABNORMAL HIGH (ref <150)
VLDL: 33 mg/dL (ref 0–40)

## 2013-10-16 LAB — FERRITIN: Ferritin: 14 ng/mL (ref 10–291)

## 2013-10-16 NOTE — Progress Notes (Signed)
Case discussed with Dr. Sharda soon after the resident saw the patient.  We reviewed the resident's history and exam and pertinent patient test results.  I agree with the assessment, diagnosis, and plan of care documented in the resident's note. 

## 2013-10-20 ENCOUNTER — Other Ambulatory Visit: Payer: Self-pay | Admitting: *Deleted

## 2013-10-20 DIAGNOSIS — F329 Major depressive disorder, single episode, unspecified: Secondary | ICD-10-CM

## 2013-10-20 DIAGNOSIS — F3289 Other specified depressive episodes: Secondary | ICD-10-CM

## 2013-10-20 MED ORDER — AMITRIPTYLINE HCL 25 MG PO TABS: 25.0000 mg | ORAL_TABLET | Freq: Every day | ORAL | Status: DC

## 2013-10-22 ENCOUNTER — Ambulatory Visit (HOSPITAL_COMMUNITY)
Admission: RE | Admit: 2013-10-22 | Discharge: 2013-10-22 | Disposition: A | Discharge status: Home / Self Care | Payer: No Typology Code available for payment source | Source: Ambulatory Visit | Attending: Internal Medicine | Admitting: Internal Medicine

## 2013-10-22 DIAGNOSIS — Z1239 Encounter for other screening for malignant neoplasm of breast: Secondary | ICD-10-CM

## 2013-10-22 DIAGNOSIS — Z1231 Encounter for screening mammogram for malignant neoplasm of breast: Secondary | ICD-10-CM | POA: Insufficient documentation

## 2013-12-14 ENCOUNTER — Ambulatory Visit: Payer: No Typology Code available for payment source

## 2013-12-16 ENCOUNTER — Ambulatory Visit: Payer: No Typology Code available for payment source

## 2014-04-26 ENCOUNTER — Other Ambulatory Visit: Payer: Self-pay | Admitting: *Deleted

## 2014-04-26 DIAGNOSIS — I10 Essential (primary) hypertension: Secondary | ICD-10-CM

## 2014-04-26 NOTE — Telephone Encounter (Signed)
Also needs refill on Amitriptylin 25mg  #30 to same pharmacy. Hilda Blades Nikhil Osei RN 04/26/14 4:15PM

## 2014-04-27 ENCOUNTER — Other Ambulatory Visit: Payer: Self-pay | Admitting: *Deleted

## 2014-04-27 DIAGNOSIS — F32A Depression, unspecified: Secondary | ICD-10-CM

## 2014-04-27 DIAGNOSIS — F329 Major depressive disorder, single episode, unspecified: Secondary | ICD-10-CM

## 2014-04-27 MED ORDER — AMITRIPTYLINE HCL 25 MG PO TABS: 25.0000 mg | ORAL_TABLET | Freq: Every day | ORAL | Status: DC

## 2014-04-27 MED ORDER — HYDROCHLOROTHIAZIDE 25 MG PO TABS: 25.0000 mg | ORAL_TABLET | Freq: Every day | ORAL | Status: DC

## 2014-04-27 NOTE — Telephone Encounter (Signed)
PLEASE CHECK THE DIAG CODE, I PICKED DEPRESSION TO GO WITH THE F32.9, DO YOU AGREE?

## 2014-04-30 ENCOUNTER — Encounter: Payer: Self-pay | Admitting: *Deleted

## 2014-06-09 ENCOUNTER — Encounter: Payer: No Typology Code available for payment source | Admitting: Internal Medicine

## 2014-06-16 ENCOUNTER — Ambulatory Visit (INDEPENDENT_AMBULATORY_CARE_PROVIDER_SITE_OTHER): Payer: No Typology Code available for payment source | Admitting: Internal Medicine

## 2014-06-16 ENCOUNTER — Ambulatory Visit: Payer: No Typology Code available for payment source

## 2014-06-16 ENCOUNTER — Encounter: Payer: Self-pay | Admitting: Internal Medicine

## 2014-06-16 VITALS — BP 130/78 | HR 78 | Temp 97.6°F | Wt 116.4 lb

## 2014-06-16 DIAGNOSIS — E785 Hyperlipidemia, unspecified: Secondary | ICD-10-CM

## 2014-06-16 DIAGNOSIS — J45909 Unspecified asthma, uncomplicated: Secondary | ICD-10-CM

## 2014-06-16 DIAGNOSIS — Z7951 Long term (current) use of inhaled steroids: Secondary | ICD-10-CM

## 2014-06-16 DIAGNOSIS — F329 Major depressive disorder, single episode, unspecified: Secondary | ICD-10-CM

## 2014-06-16 DIAGNOSIS — I10 Essential (primary) hypertension: Secondary | ICD-10-CM

## 2014-06-16 DIAGNOSIS — Z0000 Encounter for general adult medical examination without abnormal findings: Secondary | ICD-10-CM

## 2014-06-16 DIAGNOSIS — Z23 Encounter for immunization: Secondary | ICD-10-CM

## 2014-06-16 DIAGNOSIS — J4520 Mild intermittent asthma, uncomplicated: Secondary | ICD-10-CM

## 2014-06-16 DIAGNOSIS — F32A Depression, unspecified: Secondary | ICD-10-CM

## 2014-06-16 DIAGNOSIS — K219 Gastro-esophageal reflux disease without esophagitis: Secondary | ICD-10-CM

## 2014-06-16 MED ORDER — PANTOPRAZOLE SODIUM 40 MG PO TBEC: 40.0000 mg | DELAYED_RELEASE_TABLET | Freq: Every day | ORAL | Status: DC

## 2014-06-16 MED ORDER — AMITRIPTYLINE HCL 25 MG PO TABS: 25.0000 mg | ORAL_TABLET | Freq: Every day | ORAL | Status: DC

## 2014-06-16 MED ORDER — FLUTICASONE PROPIONATE HFA 110 MCG/ACT IN AERO: 2.0000 | INHALATION_SPRAY | Freq: Two times a day (BID) | RESPIRATORY_TRACT | Status: DC

## 2014-06-16 MED ORDER — HYDROCHLOROTHIAZIDE 25 MG PO TABS: 25.0000 mg | ORAL_TABLET | Freq: Every day | ORAL | Status: DC

## 2014-06-16 MED ORDER — ALBUTEROL SULFATE HFA 108 (90 BASE) MCG/ACT IN AERS: 2.0000 | INHALATION_SPRAY | RESPIRATORY_TRACT | Status: DC | PRN

## 2014-06-16 NOTE — Progress Notes (Signed)
   Subjective:    Patient ID: Kimberly [name], female    DOB: Nov 05, 1955, 59 y.o.   MRN: [id number]  HPI Pt is a 59 year old female w/ PMHx of HTN, asthma, and depression who presents for medication refills. She has no complaints today. Please see A&P for further details.     Review of Systems  Constitutional: Negative for fever, activity change and unexpected weight change.  Respiratory: Negative for shortness of breath.   Cardiovascular: Negative for chest pain.  Psychiatric/Behavioral:       Mood is better        Objective:   Physical Exam  Constitutional: She appears well-developed and well-nourished. No distress.  HENT:  Head: Normocephalic and atraumatic.  Mouth/Throat: No oropharyngeal exudate.  Eyes: Conjunctivae are normal.  Cardiovascular: Normal rate and regular rhythm.   Pulmonary/Chest: Effort normal and breath sounds normal.  Abdominal: Soft. Bowel sounds are normal.  Skin: Skin is warm and dry.  Psychiatric: She has a normal mood and affect. Her behavior is normal. Judgment and thought content normal.          Assessment & Plan:  Please see problem based charting.

## 2014-06-17 NOTE — Assessment & Plan Note (Signed)
LDL 122 on 09/2013.  Pt hesitant to try any new medications and has not tried lifestyle modifications. Pt's 10 year ASCVD risk is 7.0%. Starting a statin is not indicated at this time. Educated patient on diet and exercise.

## 2014-06-17 NOTE — Assessment & Plan Note (Signed)
Pt denies SOB or breathing difficulties. Asthma well controlled. Pt uses both inhalers 2 puffs BID.  - refilled flovent and albuterol

## 2014-06-17 NOTE — Assessment & Plan Note (Signed)
BP Readings from Last 3 Encounters:  06/16/14 130/78  10/15/13 113/78  03/25/13 119/82    Lab Results  Component Value Date   NA 138 10/15/2013   K 3.8 10/15/2013   CREATININE 0.76 10/15/2013    Assessment: Blood pressure control:  good Progress toward BP goal:   at goal Comments: on HCTZ 25mg  daily  Plan: Medications:  continue current medications Educational resources provided: brochure Self management tools provided:   Other plans: refills given

## 2014-06-17 NOTE — Assessment & Plan Note (Signed)
Pt states mood is better. Follows with mental health regularly which is where she gets her prozac filled.   -given refill of amitriptyline 25mg  qhs.

## 2014-06-17 NOTE — Assessment & Plan Note (Signed)
Given flu shot  today 

## 2014-06-17 NOTE — Assessment & Plan Note (Signed)
Pt states GERD is well controlled w/ protonix. Has tried nexium but did not like it.   - refilled protonix 40mg  daily

## 2014-06-22 NOTE — Progress Notes (Signed)
Case discussed with Dr. Hulen Luster at time of visit. We reviewed the resident's history and exam and pertinent patient test results. I agree with the assessment, diagnosis, and plan of care documented in the resident's note.

## 2014-09-08 ENCOUNTER — Telehealth: Payer: Self-pay | Admitting: *Deleted

## 2014-09-08 MED ORDER — ESOMEPRAZOLE MAGNESIUM 20 MG PO CPDR: 20.0000 mg | DELAYED_RELEASE_CAPSULE | Freq: Every day | ORAL | Status: DC

## 2014-09-08 NOTE — Telephone Encounter (Signed)
[facility] has pt on Nexium.  They no longer can get this med BUT they have 1 sample bottle for Nexium 20 mg # 90.  You will need to write a one time Rx for Nexium then can you write a Rx for Dexilant for her.  Then can get that form free.

## 2014-09-16 ENCOUNTER — Telehealth: Payer: Self-pay | Admitting: *Deleted

## 2014-09-16 NOTE — Telephone Encounter (Signed)
Pharm states they can no longer get nexium at no cost, they can get dexilant, will you change this?

## 2014-09-17 MED ORDER — OMEPRAZOLE 20 MG PO CPDR: 20.0000 mg | DELAYED_RELEASE_CAPSULE | Freq: Every day | ORAL | Status: DC

## 2014-09-17 NOTE — Telephone Encounter (Signed)
Pt was seen on 05/2014 and stated she did not like nexium and wanted protonix which was refilled at that time. Dexilant is not something I would try b/c nexium cannot be filled. Can try prilosec which i sent a prescription in for. Thanks.   Julious Oka, MD Internal Medicine Resident, PGY I [facility] Program Pager: [phone]

## 2014-09-23 NOTE — Telephone Encounter (Signed)
Dr Hulen Luster you approved a refill of nexium on 4/13, please review the meds and if it has been discontinued please remove it from the med list

## 2014-12-09 ENCOUNTER — Ambulatory Visit: Payer: Self-pay

## 2014-12-13 ENCOUNTER — Ambulatory Visit: Payer: Self-pay

## 2015-01-12 ENCOUNTER — Encounter: Payer: Self-pay | Admitting: Internal Medicine

## 2015-03-09 ENCOUNTER — Other Ambulatory Visit: Payer: Self-pay

## 2015-03-10 MED ORDER — ESOMEPRAZOLE MAGNESIUM 20 MG PO CPDR: 20.0000 mg | DELAYED_RELEASE_CAPSULE | Freq: Every day | ORAL | Status: DC

## 2015-04-19 ENCOUNTER — Encounter: Payer: Self-pay | Admitting: Student

## 2015-06-25 ENCOUNTER — Other Ambulatory Visit: Payer: Self-pay | Admitting: Internal Medicine

## 2015-06-27 ENCOUNTER — Telehealth: Payer: Self-pay | Admitting: Internal Medicine

## 2015-06-27 NOTE — Telephone Encounter (Signed)
Pt requesting hydrochlorothiazide and amitriptyline @ [facility] on [address].

## 2015-06-27 NOTE — Telephone Encounter (Signed)
Request has been sent

## 2015-09-14 ENCOUNTER — Encounter: Payer: Self-pay | Admitting: Internal Medicine

## 2015-09-19 ENCOUNTER — Ambulatory Visit: Payer: Self-pay

## 2015-09-24 ENCOUNTER — Other Ambulatory Visit: Payer: Self-pay | Admitting: Internal Medicine

## 2015-09-26 NOTE — Telephone Encounter (Signed)
Patient has not been seen since 05/2014. Multiple cancellations. Will send to front office for appointment.

## 2015-09-28 ENCOUNTER — Encounter: Payer: Self-pay | Admitting: Internal Medicine

## 2015-10-02 ENCOUNTER — Other Ambulatory Visit: Payer: Self-pay | Admitting: Internal Medicine

## 2015-10-03 NOTE — Telephone Encounter (Signed)
Last appointment 01/12/2015. No future appointments.

## 2015-10-13 ENCOUNTER — Encounter: Payer: Self-pay | Admitting: Internal Medicine

## 2015-10-20 ENCOUNTER — Encounter: Payer: Self-pay | Admitting: Internal Medicine

## 2015-12-15 ENCOUNTER — Other Ambulatory Visit: Payer: Self-pay | Admitting: Internal Medicine

## 2015-12-15 MED ORDER — HYDROCHLOROTHIAZIDE 25 MG PO TABS: 25.0000 mg | ORAL_TABLET | Freq: Every day | ORAL | Status: DC

## 2015-12-15 NOTE — Telephone Encounter (Signed)
hydrochlorothiazide (HYDRODIURIL) 25 MG tablet amitriptyline (ELAVIL) 25 MG tablet  [facility]

## 2015-12-25 ENCOUNTER — Other Ambulatory Visit: Payer: Self-pay | Admitting: Internal Medicine

## 2015-12-26 NOTE — Telephone Encounter (Signed)
Has appointment in September, but has not been seen since 06/16/14. Letter sent x1

## 2016-02-13 ENCOUNTER — Encounter: Payer: Self-pay | Admitting: Internal Medicine

## 2016-02-28 ENCOUNTER — Encounter: Payer: Self-pay | Admitting: *Deleted

## 2016-04-04 ENCOUNTER — Encounter: Payer: Self-pay | Admitting: Internal Medicine

## 2016-04-25 ENCOUNTER — Ambulatory Visit (INDEPENDENT_AMBULATORY_CARE_PROVIDER_SITE_OTHER): Payer: Self-pay | Admitting: Internal Medicine

## 2016-04-25 ENCOUNTER — Encounter: Payer: Self-pay | Admitting: Internal Medicine

## 2016-04-25 VITALS — BP 125/86 | HR 90 | Temp 98.7°F | Wt 104.0 lb

## 2016-04-25 DIAGNOSIS — Z87891 Personal history of nicotine dependence: Secondary | ICD-10-CM

## 2016-04-25 DIAGNOSIS — D509 Iron deficiency anemia, unspecified: Secondary | ICD-10-CM

## 2016-04-25 DIAGNOSIS — E785 Hyperlipidemia, unspecified: Secondary | ICD-10-CM

## 2016-04-25 DIAGNOSIS — Z23 Encounter for immunization: Secondary | ICD-10-CM

## 2016-04-25 DIAGNOSIS — Z0000 Encounter for general adult medical examination without abnormal findings: Secondary | ICD-10-CM

## 2016-04-25 DIAGNOSIS — I10 Essential (primary) hypertension: Secondary | ICD-10-CM

## 2016-04-25 DIAGNOSIS — F329 Major depressive disorder, single episode, unspecified: Secondary | ICD-10-CM

## 2016-04-25 DIAGNOSIS — H6123 Impacted cerumen, bilateral: Secondary | ICD-10-CM

## 2016-04-25 DIAGNOSIS — Z79899 Other long term (current) drug therapy: Secondary | ICD-10-CM

## 2016-04-25 DIAGNOSIS — F32A Depression, unspecified: Secondary | ICD-10-CM

## 2016-04-25 MED ORDER — AMITRIPTYLINE HCL 25 MG PO TABS: 25.0000 mg | ORAL_TABLET | Freq: Every day | ORAL | 3 refills | Status: DC

## 2016-04-25 MED ORDER — HYDROCHLOROTHIAZIDE 25 MG PO TABS: 25.0000 mg | ORAL_TABLET | Freq: Every day | ORAL | 3 refills | Status: DC

## 2016-04-25 NOTE — Addendum Note (Signed)
Addended by: Norman Herrlich on: 04/25/2016 03:49 PM   Modules accepted: Orders

## 2016-04-25 NOTE — Assessment & Plan Note (Signed)
History of excessive  Cerumen in ear canal. On exam she has mild cerumen and clear tympanic membranes bilaterally. She does not use  Cotton swabs  -  Advised to use Debrox

## 2016-04-25 NOTE — Assessment & Plan Note (Signed)
Her mood is stable. She is on amitriptyline 25 mg daily.  -  Will continue this dose.

## 2016-04-25 NOTE — Assessment & Plan Note (Addendum)
History of iron deficiency anemia and was previously on iron supplements.Checking CBC and ferritin today.

## 2016-04-25 NOTE — Assessment & Plan Note (Addendum)
Ordered lipid panel today.  Update: ASCVD 14.7%. Called patient to advise her to start pravastatin '40mg'$ . Left message for her to call clinic to inform her of her lab results and new rx. Will send in rx.

## 2016-04-25 NOTE — Progress Notes (Signed)
   CC: HTN  HPI:  [name] is a 60 y.o. past medical history as outlined below who presented to clinic for hypertension. follow-up please see problem list for further details of her chronic medical issues.  Past Medical History:  Diagnosis Date  . Alcohol abuse, in remission    since around 2005  . Alcoholic hepatitis    fatty liver on imaging  . Allergic rhinitis   . Anemia    EGD with duodenal AVM, normal colonoscopy 04/2012  . Asthma   . Callus of foot 2009  . Chest pain, musculoskeletal 2011  . Cholelithiasis    [place] 09/2008: Cholelithiasis is present, s/p cholecystectomy  . Depression   . Excessive cerumen in ear canal    since before 2000  . GERD (gastroesophageal reflux disease)   . Hyperlipidemia   . Hypertension   . Menopausal syndrome    Treated with estradiol in the past - discontinued 04/2012  . Otitis externa, acute Feb 2012   bilateral, treated with azithromycin after failure of neomycin drops  . Otitis media, acute Feb 2012  . PONV (postoperative nausea and vomiting)     Review of Systems:  Denies shortness of breath, nausea, vomiting, abdominal pain, skin pallor, blurry vision. Her mood is stable.  Physical Exam:  Vitals:   04/25/16 1507  BP: 125/86  Pulse: 90  Temp: 98.7 F (37.1 C)  TempSrc: Oral  SpO2: 99%  Weight: 104 lb (47.2 kg)   Physical Exam  Constitutional: She is oriented to person, place, and time. She appears well-developed and well-nourished. No distress.  HENT:  Head: Normocephalic and atraumatic.  Right Ear: External ear normal.  Left Ear: External ear normal.  Mild cerumen around external ear canal. Clear tympanic membranes bilaterally  Cardiovascular: Normal rate, regular rhythm and normal heart sounds.  Exam reveals no gallop and no friction rub.   No murmur heard. Pulmonary/Chest: Effort normal and breath sounds normal. No respiratory distress. She has no wheezes. She has no rales.  Abdominal: Soft. Bowel sounds are  normal. She exhibits no distension. There is no tenderness. There is no rebound and no guarding.  Neurological: She is alert and oriented to person, place, and time.  Skin: Skin is warm and dry. No rash noted. She is not diaphoretic. No erythema. No pallor.    Assessment & Plan:   See Encounters Tab for problem based charting.  Patient discussed with Dr. Angelia Mould

## 2016-04-25 NOTE — Assessment & Plan Note (Addendum)
Getting flu shot visit. Referral placed for mammogram.

## 2016-04-25 NOTE — Assessment & Plan Note (Signed)
BP Readings from Last 3 Encounters:  04/25/16 125/86  06/16/14 130/78  10/15/13 113/78    Lab Results  Component Value Date   NA 138 10/15/2013   K 3.8 10/15/2013   CREATININE 0.76 10/15/2013    Assessment: Blood pressure control:   controlled Progress toward BP goal:     At goal Comments:  On   HCTZ 25 mg daily  Plan: Medications:  continue current medications Other plans:    Basic metabolic panel today. Follow-up in 6 months.

## 2016-04-25 NOTE — Patient Instructions (Signed)
Use over the counter Debrox for your ear wax.

## 2016-04-26 LAB — CBC
HEMOGLOBIN: 11.4 g/dL (ref 11.1–15.9)
Hematocrit: 36 % (ref 34.0–46.6)
MCH: 28.1 pg (ref 26.6–33.0)
MCHC: 31.7 g/dL (ref 31.5–35.7)
MCV: 89 fL (ref 79–97)
PLATELETS: 404 10*3/uL — AB (ref 150–379)
RBC: 4.06 x10E6/uL (ref 3.77–5.28)
RDW: 14.5 % (ref 12.3–15.4)
WBC: 11.1 10*3/uL — AB (ref 3.4–10.8)

## 2016-04-26 LAB — BMP8+ANION GAP
Anion Gap: 17 mmol/L (ref 10.0–18.0)
BUN / CREAT RATIO: 14 (ref 12–28)
BUN: 11 mg/dL (ref 8–27)
CHLORIDE: 93 mmol/L — AB (ref 96–106)
CO2: 28 mmol/L (ref 18–29)
Calcium: 9.3 mg/dL (ref 8.7–10.3)
Creatinine, Ser: 0.77 mg/dL (ref 0.57–1.00)
GFR calc non Af Amer: 84 mL/min/{1.73_m2} (ref >59)
GFR, EST AFRICAN AMERICAN: 96 mL/min/{1.73_m2} (ref >59)
GLUCOSE: 86 mg/dL (ref 65–99)
POTASSIUM: 3.7 mmol/L (ref 3.5–5.2)
SODIUM: 138 mmol/L (ref 134–144)

## 2016-04-26 LAB — LIPID PANEL
CHOLESTEROL TOTAL: 226 mg/dL — AB (ref 100–199)
Chol/HDL Ratio: 3.7 ratio units (ref 0.0–4.4)
HDL: 61 mg/dL (ref >39)
LDL Calculated: 147 mg/dL — ABNORMAL HIGH (ref 0–99)
Triglycerides: 91 mg/dL (ref 0–149)
VLDL Cholesterol Cal: 18 mg/dL (ref 5–40)

## 2016-04-26 LAB — FERRITIN: Ferritin: 40 ng/mL (ref 15–150)

## 2016-04-27 MED ORDER — PRAVASTATIN SODIUM 40 MG PO TABS: 40.0000 mg | ORAL_TABLET | Freq: Every evening | ORAL | 11 refills | Status: DC

## 2016-04-27 NOTE — Addendum Note (Signed)
Addended by: Norman Herrlich on: 04/27/2016 10:11 AM   Modules accepted: Orders

## 2016-04-30 ENCOUNTER — Telehealth: Payer: Self-pay | Admitting: Internal Medicine

## 2016-04-30 NOTE — Telephone Encounter (Signed)
TALKED WITH PT, SHE WAS APPROVED FOR DISABILITY IN 11/17, WAITING ON FIRST CHECK AND MEDICARE CARD.

## 2016-05-01 NOTE — Progress Notes (Signed)
Internal Medicine Clinic Attending  Case discussed with Dr. Truong at the time of the visit.  We reviewed the resident's history and exam and pertinent patient test results.  I agree with the assessment, diagnosis, and plan of care documented in the resident's note.  

## 2016-08-27 ENCOUNTER — Telehealth: Payer: Self-pay | Admitting: Internal Medicine

## 2016-08-28 NOTE — Telephone Encounter (Signed)
Dr. Caron Presume schedule is currently booked until June.  Spoke with Kimberly [name] and another clinic date will be added in June later on this month.  Would you prefer for her to wait until June or see another doctor in [facility]?

## 2016-09-20 NOTE — Telephone Encounter (Signed)
appt scheduled for 11/06/2016 @ 8:15am

## 2016-10-02 ENCOUNTER — Telehealth: Payer: Self-pay | Admitting: Internal Medicine

## 2016-10-02 NOTE — Telephone Encounter (Signed)
PATIENT DENIED DISABILITY, HAS APPLIED FOR EARLY RETIREMENT WHICH WILL START 10/26/16, PATIENT WILL BE RENEWING [facility] CARD NEXT WEEK.

## 2016-10-08 ENCOUNTER — Telehealth: Payer: Self-pay | Admitting: Internal Medicine

## 2016-10-08 NOTE — Telephone Encounter (Signed)
APT. REMINDER CALL, LMTCB °

## 2016-10-11 ENCOUNTER — Ambulatory Visit: Payer: Self-pay

## 2016-11-02 ENCOUNTER — Ambulatory Visit: Payer: Self-pay

## 2016-11-06 ENCOUNTER — Encounter: Payer: Self-pay | Admitting: Internal Medicine

## 2017-01-03 ENCOUNTER — Ambulatory Visit: Payer: Self-pay

## 2017-01-07 ENCOUNTER — Ambulatory Visit: Payer: Self-pay

## 2017-01-18 ENCOUNTER — Ambulatory Visit (INDEPENDENT_AMBULATORY_CARE_PROVIDER_SITE_OTHER): Payer: Self-pay | Admitting: Internal Medicine

## 2017-01-18 VITALS — BP 125/55 | HR 79 | Temp 97.8°F | Ht 64.0 in | Wt 107.6 lb

## 2017-01-18 DIAGNOSIS — J4520 Mild intermittent asthma, uncomplicated: Secondary | ICD-10-CM

## 2017-01-18 DIAGNOSIS — I10 Essential (primary) hypertension: Secondary | ICD-10-CM

## 2017-01-18 DIAGNOSIS — Z7951 Long term (current) use of inhaled steroids: Secondary | ICD-10-CM

## 2017-01-18 DIAGNOSIS — Z87891 Personal history of nicotine dependence: Secondary | ICD-10-CM

## 2017-01-18 DIAGNOSIS — Z79899 Other long term (current) drug therapy: Secondary | ICD-10-CM

## 2017-01-18 DIAGNOSIS — J45909 Unspecified asthma, uncomplicated: Secondary | ICD-10-CM

## 2017-01-18 MED ORDER — FLUTICASONE PROPIONATE HFA 110 MCG/ACT IN AERO: 2.0000 | INHALATION_SPRAY | Freq: Two times a day (BID) | RESPIRATORY_TRACT | 6 refills | Status: DC

## 2017-01-18 MED ORDER — HYDROCHLOROTHIAZIDE 25 MG PO TABS: 25.0000 mg | ORAL_TABLET | Freq: Every day | ORAL | 3 refills | Status: DC

## 2017-01-18 MED ORDER — ALBUTEROL SULFATE HFA 108 (90 BASE) MCG/ACT IN AERS: 2.0000 | INHALATION_SPRAY | RESPIRATORY_TRACT | 6 refills | Status: DC | PRN

## 2017-01-18 NOTE — Patient Instructions (Addendum)
Ms. Prosser it was nice meeting you today.  -I have refilled your medications today.  -Return for a follow-up in 6 months.

## 2017-01-18 NOTE — Assessment & Plan Note (Signed)
Currently well-controlled. Patient denies having any shortness of breath or wheezing. Currently on Flovent. States she uses her albuterol inhaler only as needed.  Plan -Refill flovent -Refill albuterol prn inhaler

## 2017-01-18 NOTE — Progress Notes (Signed)
   CC: Patient is requesting refills on her hypertension and asthma medications.  HPI:  Kimberly [name] is a 61 y.o. female with a past medical history of conditions listed below presenting to the clinic requesting refills on her hypertension and asthma medications. Please see problem based charting for the status of the patient's current and chronic medical conditions.   Past Medical History:  Diagnosis Date  . Alcohol abuse, in remission    since around 2005  . Alcoholic hepatitis    fatty liver on imaging  . Allergic rhinitis   . Anemia    EGD with duodenal AVM, normal colonoscopy 04/2012  . Asthma   . Callus of foot 2009  . Chest pain, musculoskeletal 2011  . Cholelithiasis    [place] 09/2008: Cholelithiasis is present, s/p cholecystectomy  . Depression   . Excessive cerumen in ear canal    since before 2000  . GERD (gastroesophageal reflux disease)   . Hyperlipidemia   . Hypertension   . Menopausal syndrome    Treated with estradiol in the past - discontinued 04/2012  . Otitis externa, acute Feb 2012   bilateral, treated with azithromycin after failure of neomycin drops  . Otitis media, acute Feb 2012  . PONV (postoperative nausea and vomiting)    Review of Systems:  Review of Systems  Constitutional: Negative for chills and fever.  Respiratory: Negative for shortness of breath and wheezing.   Cardiovascular: Negative for chest pain.  Gastrointestinal: Negative for abdominal pain.    Physical Exam:  Vitals:   01/18/17 0959  BP: (!) 125/55  Pulse: 79  Temp: 97.8 F (36.6 C)  TempSrc: Oral  SpO2: 100%  Weight: 107 lb 9.6 oz (48.8 kg)  Height: 5\' 4"  (1.626 m)   Physical Exam  Constitutional: She is oriented to person, place, and time. She appears well-developed and well-nourished. No distress.  HENT:  Mouth/Throat: Oropharynx is clear and moist.  Eyes: Right eye exhibits no discharge. Left eye exhibits no discharge.  Cardiovascular: Normal rate, regular  rhythm and intact distal pulses.  Exam reveals no gallop and no friction rub.   No murmur heard. Pulmonary/Chest: Effort normal and breath sounds normal. No respiratory distress. She has no wheezes. She has no rales.  Abdominal: Soft. Bowel sounds are normal. She exhibits no distension. There is no tenderness.  Musculoskeletal: She exhibits no edema.  Neurological: She is alert and oriented to person, place, and time.  Skin: Skin is warm and dry.    Assessment & Plan:   See Encounters Tab for problem based charting.  Patient discussed with Dr. Beryle Beams

## 2017-01-18 NOTE — Assessment & Plan Note (Addendum)
Well-controlled hypertension. Blood pressure 125/55 at this visit. Currently on hydrochlorothiazide 25 mg daily and reports compliance.  Plan -Hydrochlorothiazide has been refilled -BMP  Addendum 01/21/2017: Cr stable at 0.7 and electrolytes normal. Tried calling the patient and could not reach her over the phone. Left VM asking her to call the clinic back.

## 2017-01-18 NOTE — Progress Notes (Signed)
Medicine attending: Medical history, presenting problems, physical findings, and medications, reviewed with resident physician Dr Vasu Rathore on the day of the patient visit and I concur with her evaluation and management plan. 

## 2017-01-19 LAB — BMP8+ANION GAP
ANION GAP: 15 mmol/L (ref 10.0–18.0)
BUN/Creatinine Ratio: 9 — ABNORMAL LOW (ref 12–28)
BUN: 7 mg/dL — ABNORMAL LOW (ref 8–27)
CO2: 24 mmol/L (ref 20–29)
CREATININE: 0.75 mg/dL (ref 0.57–1.00)
Calcium: 9.4 mg/dL (ref 8.7–10.3)
Chloride: 98 mmol/L (ref 96–106)
GFR calc Af Amer: 99 mL/min/{1.73_m2} (ref >59)
GFR calc non Af Amer: 86 mL/min/{1.73_m2} (ref >59)
GLUCOSE: 78 mg/dL (ref 65–99)
POTASSIUM: 4.5 mmol/L (ref 3.5–5.2)
SODIUM: 137 mmol/L (ref 134–144)

## 2017-01-21 ENCOUNTER — Telehealth: Payer: Self-pay | Admitting: Internal Medicine

## 2017-07-24 ENCOUNTER — Encounter: Payer: Self-pay | Admitting: Internal Medicine

## 2017-08-07 ENCOUNTER — Encounter: Payer: Self-pay | Admitting: Internal Medicine

## 2017-08-14 ENCOUNTER — Encounter: Payer: Self-pay | Admitting: Internal Medicine

## 2017-09-04 ENCOUNTER — Encounter: Payer: Self-pay | Admitting: Internal Medicine

## 2017-09-06 ENCOUNTER — Encounter: Payer: Self-pay | Admitting: Internal Medicine

## 2017-09-10 ENCOUNTER — Other Ambulatory Visit: Payer: Self-pay

## 2017-09-10 MED ORDER — AMITRIPTYLINE HCL 25 MG PO TABS: 25.0000 mg | ORAL_TABLET | Freq: Every day | ORAL | 1 refills | Status: DC

## 2017-09-10 NOTE — Telephone Encounter (Signed)
amitriptyline (ELAVIL) 25 MG tablet, refill request @ [facility] on [address].

## 2017-10-02 ENCOUNTER — Encounter: Payer: Self-pay | Admitting: Internal Medicine

## 2017-10-23 ENCOUNTER — Encounter: Payer: Self-pay | Admitting: Internal Medicine

## 2017-11-02 ENCOUNTER — Other Ambulatory Visit: Payer: Self-pay | Admitting: Internal Medicine

## 2017-11-04 NOTE — Telephone Encounter (Signed)
Called pt - line busy. I will send message to front office to call pt and schedule an appt.

## 2017-11-06 NOTE — Telephone Encounter (Signed)
Please schedule pt an appt . Thanks

## 2017-11-22 ENCOUNTER — Ambulatory Visit: Payer: Medicaid Other

## 2017-11-29 ENCOUNTER — Ambulatory Visit: Payer: Medicaid Other

## 2017-12-09 ENCOUNTER — Encounter: Payer: Self-pay | Admitting: Internal Medicine

## 2017-12-09 ENCOUNTER — Ambulatory Visit: Payer: Medicaid Other

## 2017-12-16 ENCOUNTER — Encounter: Payer: Self-pay | Admitting: *Deleted

## 2018-01-02 NOTE — Telephone Encounter (Signed)
Opened in Error.

## 2018-01-15 ENCOUNTER — Ambulatory Visit (INDEPENDENT_AMBULATORY_CARE_PROVIDER_SITE_OTHER): Payer: Medicaid Other | Admitting: Internal Medicine

## 2018-01-15 ENCOUNTER — Encounter: Payer: Self-pay | Admitting: Internal Medicine

## 2018-01-15 ENCOUNTER — Ambulatory Visit (HOSPITAL_COMMUNITY)
Admission: RE | Admit: 2018-01-15 | Discharge: 2018-01-15 | Disposition: A | Discharge status: Home / Self Care | Payer: Medicaid Other | Source: Ambulatory Visit | Attending: Internal Medicine | Admitting: Internal Medicine

## 2018-01-15 ENCOUNTER — Telehealth: Payer: Self-pay | Admitting: *Deleted

## 2018-01-15 ENCOUNTER — Other Ambulatory Visit: Payer: Self-pay

## 2018-01-15 ENCOUNTER — Other Ambulatory Visit: Payer: Self-pay | Admitting: Internal Medicine

## 2018-01-15 ENCOUNTER — Encounter (HOSPITAL_COMMUNITY): Payer: Self-pay

## 2018-01-15 ENCOUNTER — Ambulatory Visit (HOSPITAL_COMMUNITY)
Admission: RE | Admit: 2018-01-15 | Discharge: 2018-01-15 | Disposition: A | Discharge status: Home / Self Care | Payer: Medicaid Other | Source: Ambulatory Visit | Attending: Oncology | Admitting: Oncology

## 2018-01-15 VITALS — BP 132/83 | HR 80 | Temp 97.5°F | Ht 64.0 in | Wt 105.2 lb

## 2018-01-15 DIAGNOSIS — M79651 Pain in right thigh: Secondary | ICD-10-CM | POA: Diagnosis not present

## 2018-01-15 DIAGNOSIS — F329 Major depressive disorder, single episode, unspecified: Secondary | ICD-10-CM | POA: Diagnosis not present

## 2018-01-15 DIAGNOSIS — Z0000 Encounter for general adult medical examination without abnormal findings: Secondary | ICD-10-CM | POA: Diagnosis not present

## 2018-01-15 DIAGNOSIS — K219 Gastro-esophageal reflux disease without esophagitis: Secondary | ICD-10-CM

## 2018-01-15 DIAGNOSIS — F419 Anxiety disorder, unspecified: Secondary | ICD-10-CM | POA: Diagnosis not present

## 2018-01-15 DIAGNOSIS — J45909 Unspecified asthma, uncomplicated: Secondary | ICD-10-CM | POA: Diagnosis not present

## 2018-01-15 DIAGNOSIS — I10 Essential (primary) hypertension: Secondary | ICD-10-CM | POA: Diagnosis not present

## 2018-01-15 DIAGNOSIS — M25561 Pain in right knee: Secondary | ICD-10-CM | POA: Diagnosis not present

## 2018-01-15 DIAGNOSIS — E785 Hyperlipidemia, unspecified: Secondary | ICD-10-CM | POA: Diagnosis not present

## 2018-01-15 DIAGNOSIS — F32A Depression, unspecified: Secondary | ICD-10-CM

## 2018-01-15 DIAGNOSIS — Z1231 Encounter for screening mammogram for malignant neoplasm of breast: Secondary | ICD-10-CM

## 2018-01-15 DIAGNOSIS — Z23 Encounter for immunization: Secondary | ICD-10-CM

## 2018-01-15 DIAGNOSIS — J4520 Mild intermittent asthma, uncomplicated: Secondary | ICD-10-CM

## 2018-01-15 DIAGNOSIS — Z79899 Other long term (current) drug therapy: Secondary | ICD-10-CM | POA: Diagnosis not present

## 2018-01-15 DIAGNOSIS — G8929 Other chronic pain: Secondary | ICD-10-CM

## 2018-01-15 MED ORDER — OMEPRAZOLE 20 MG PO CPDR: 20.0000 mg | DELAYED_RELEASE_CAPSULE | Freq: Every day | ORAL | 3 refills | Status: DC

## 2018-01-15 MED ORDER — FLUTICASONE PROPIONATE HFA 110 MCG/ACT IN AERO: 2.0000 | INHALATION_SPRAY | Freq: Two times a day (BID) | RESPIRATORY_TRACT | 6 refills | Status: DC

## 2018-01-15 MED ORDER — HYDROCHLOROTHIAZIDE 25 MG PO TABS: 25.0000 mg | ORAL_TABLET | Freq: Every day | ORAL | 3 refills | Status: DC

## 2018-01-15 MED ORDER — PRAVASTATIN SODIUM 40 MG PO TABS: 40.0000 mg | ORAL_TABLET | Freq: Every evening | ORAL | 3 refills | Status: DC

## 2018-01-15 MED ORDER — AMITRIPTYLINE HCL 25 MG PO TABS: 25.0000 mg | ORAL_TABLET | Freq: Every day | ORAL | 5 refills | Status: DC

## 2018-01-15 MED ORDER — PANTOPRAZOLE SODIUM 40 MG PO TBEC: 40.0000 mg | DELAYED_RELEASE_TABLET | Freq: Every day | ORAL | 4 refills | Status: DC

## 2018-01-15 NOTE — Assessment & Plan Note (Signed)
Obtain HIV and Hep C screening  Flu shot given Referral for mammogram placed

## 2018-01-15 NOTE — Progress Notes (Signed)
Medicine attending: I personally interviewed and briefly examined this patient on the day of the patient visit and reviewed pertinent clinical   & laboratory data  with resident physician Dr. Dr Hayden Rasmussen and we discussed a management plan. Overall doing well. Depression controlled on low dose amytryptiline. Chronic musculoskeletal pain and proximal R leg weakness; no back or hip pain; not on steroids; strength 4+/5; DTR 2+ symmetric at patellae. We will get X-Ray hip; R/O referred pain from [facility]; consider lateral femoral cutaneous nerve syndrome.

## 2018-01-15 NOTE — Assessment & Plan Note (Signed)
She endorses right thigh/knee pain that has been present for the past 7 years. Her right leg is weaker than the left without swelling or erythema. She uses a homemade cane. Anterior flexion of her hip is 3/5, anterior thigh is TTP. All other movements symmetrical and 5/5 bilaterally.   - tried to prescribe DME cane - cheaper for pt to buy one at [facility]  - ordered 2 view hip xray on right  - renewed pts handicap form

## 2018-01-15 NOTE — Assessment & Plan Note (Signed)
Blood pressure today 132/83. Patient states she has been compliant with medication.   - renew HCTZ 25mg  qd

## 2018-01-15 NOTE — Assessment & Plan Note (Signed)
Patient has not been taking her pravastatin and is not sure of the last time she had it. She is willing to start this medication.   - pravastatin 40mg  qd - lipid panel today

## 2018-01-15 NOTE — Assessment & Plan Note (Signed)
She states her flovent and albuterol are working well for her. She gets SOB when she runs out, but otherwise has no issues.   - refill flovent  - refill albuterol

## 2018-01-15 NOTE — Progress Notes (Signed)
   CC: Refills  HPI:  Kimberly [name] is a 62 y.o. with significant PMH below. She is here today for refills of her medications and to have her handicap renewel form filled out. Please with A&P for further details.    Past Medical History:  Diagnosis Date  . Alcohol abuse, in remission    since around 2005  . Alcoholic hepatitis    fatty liver on imaging  . Allergic rhinitis   . Anemia    EGD with duodenal AVM, normal colonoscopy 04/2012  . Asthma   . Callus of foot 2009  . Chest pain, musculoskeletal 2011  . Cholelithiasis    [place] 09/2008: Cholelithiasis is present, s/p cholecystectomy  . Depression   . Excessive cerumen in ear canal    since before 2000  . GERD (gastroesophageal reflux disease)   . Hyperlipidemia   . Hypertension   . Menopausal syndrome    Treated with estradiol in the past - discontinued 04/2012  . Otitis externa, acute Feb 2012   bilateral, treated with azithromycin after failure of neomycin drops  . Otitis media, acute Feb 2012  . PONV (postoperative nausea and vomiting)    Review of Systems:   Review of Systems  Constitutional: Negative for fever, malaise/fatigue and weight loss.  HENT: Positive for sinus pain. Negative for ear discharge, ear pain, hearing loss and sore throat.   Eyes: Negative.  Negative for blurred vision.  Respiratory: Positive for shortness of breath. Negative for cough and wheezing.   Cardiovascular: Negative for chest pain, palpitations and leg swelling.  Gastrointestinal: Positive for heartburn, nausea and vomiting. Negative for constipation and diarrhea.  Neurological: Negative for dizziness, tingling and headaches.  Psychiatric/Behavioral: Positive for depression. Negative for suicidal ideas. The patient is not nervous/anxious.   All other systems reviewed and are negative.     Physical Exam: Constitution: cachectic, NAD, appears stated age HENT: Normocephalic, atraumatic, poor dentition Cardio: regular rate &  rhythm, no m/r/g Respiratory: clear to auscultation bilaterally, no wheezing, rales, rhonchi GI: soft, NTTP, non-diste nded MSK: right thigh flexion strength 3/5 due to pain, TTP anterior thigh and anteriolateral distal thigh. Knee NTTP. No swelling or erythema. Left leg strength wnl,  Neuro: A&O, pleasant, cooperative  Skin: clean, dry, intact   Assessment & Plan:   See Encounters Tab for problem based charting.  Patient seen with Dr. Beryle Beams

## 2018-01-15 NOTE — Telephone Encounter (Signed)
Received call from [facility] requesting clarification on Hip X-Ray ordered today. States this is usually done as 2 view but only one view ordered. Patient is there now. VO received from Dr Sharon Seller to change to 2 view, however, radiology requesting new order be sent. Hubbard Hartshorn, RN, BSN

## 2018-01-15 NOTE — Assessment & Plan Note (Signed)
She reports increased in heartburn and is requesting renewal of her protonix which controlled her symptoms before.   - protonix 20mg  qd

## 2018-01-15 NOTE — Assessment & Plan Note (Signed)
She states amitriptyline was working well for her and is controlling her anxiety and depression. She was recently placed on fluoxetine and hydroxyzine by [employer], which she states caused nausea and vomiting. She stopped taking both medications and would like to cont. Only with the amitriptyline. She denied any thoughts of her hurting herself. We discussed that there were alternate medications if she felt like her depression was not controlled.   - cont. amitriptyline 25mg  qhs

## 2018-01-16 LAB — LIPID PANEL
CHOL/HDL RATIO: 4.9 ratio — AB (ref 0.0–4.4)
Cholesterol, Total: 244 mg/dL — ABNORMAL HIGH (ref 100–199)
HDL: 50 mg/dL (ref >39)
LDL Calculated: 172 mg/dL — ABNORMAL HIGH (ref 0–99)
TRIGLYCERIDES: 112 mg/dL (ref 0–149)
VLDL CHOLESTEROL CAL: 22 mg/dL (ref 5–40)

## 2018-01-16 LAB — HIV ANTIBODY (ROUTINE TESTING W REFLEX): HIV SCREEN 4TH GENERATION: NONREACTIVE

## 2018-01-16 LAB — HEPATITIS C ANTIBODY: Hep C Virus Ab: >11 s/co ratio — ABNORMAL HIGH (ref 0.0–0.9)

## 2018-01-22 NOTE — Progress Notes (Signed)
Discussed HCV antibody results with patient. Will need to schedule f/u blood work to test for HCV viral load.

## 2018-01-30 ENCOUNTER — Ambulatory Visit: Payer: Medicaid Other

## 2018-02-03 ENCOUNTER — Telehealth: Payer: Self-pay | Admitting: Internal Medicine

## 2018-02-03 NOTE — Telephone Encounter (Signed)
Patient called to let PCP know she is taking all her meds. Med rec done. "Phone in" option selected for omeprazole so patient has not been taking this but states she can tell a difference with just the pantoprazole. Hubbard Hartshorn, RN, BSN

## 2018-02-03 NOTE — Telephone Encounter (Signed)
Pt has a question to ask the nurse, as soon as possible; pt contact# [phone]

## 2018-02-04 ENCOUNTER — Ambulatory Visit: Payer: Medicaid Other

## 2018-02-05 ENCOUNTER — Telehealth: Payer: Self-pay | Admitting: Internal Medicine

## 2018-02-05 DIAGNOSIS — R768 Other specified abnormal immunological findings in serum: Secondary | ICD-10-CM

## 2018-02-06 NOTE — Telephone Encounter (Signed)
Lab ordered for future HCV quant appointment.

## 2018-02-12 ENCOUNTER — Ambulatory Visit: Payer: Medicaid Other

## 2018-02-13 ENCOUNTER — Telehealth: Payer: Self-pay

## 2018-02-13 NOTE — Telephone Encounter (Signed)
Needs to speak with a nurse about something. Please call back.

## 2018-02-13 NOTE — Telephone Encounter (Signed)
Pt wanted to know what kind of test is being done on the 27th? Explained her doctor has ordered f/u test for hepatitis called HCV quant. Stated ok and she will be here.

## 2018-02-21 ENCOUNTER — Other Ambulatory Visit (INDEPENDENT_AMBULATORY_CARE_PROVIDER_SITE_OTHER): Payer: Medicaid Other

## 2018-02-21 DIAGNOSIS — R768 Other specified abnormal immunological findings in serum: Secondary | ICD-10-CM | POA: Diagnosis not present

## 2018-02-22 LAB — HCV RNA QUANT: HEPATITIS C QUANTITATION: NOT DETECTED [IU]/mL

## 2018-02-24 ENCOUNTER — Ambulatory Visit: Payer: Medicaid Other

## 2018-03-21 ENCOUNTER — Ambulatory Visit: Payer: Medicaid Other

## 2018-04-17 ENCOUNTER — Ambulatory Visit: Payer: Medicaid Other

## 2018-05-22 ENCOUNTER — Ambulatory Visit: Payer: Medicaid Other

## 2018-06-20 ENCOUNTER — Ambulatory Visit: Payer: Medicaid Other

## 2018-07-01 ENCOUNTER — Other Ambulatory Visit: Payer: Self-pay | Admitting: Internal Medicine

## 2018-07-01 DIAGNOSIS — F329 Major depressive disorder, single episode, unspecified: Secondary | ICD-10-CM

## 2018-07-01 DIAGNOSIS — F32A Depression, unspecified: Secondary | ICD-10-CM

## 2018-07-01 NOTE — Telephone Encounter (Signed)
There is a note attached to her prescription stating "No additional refills until patient seen in clinic." Is anyone aware of how to see who wrote this note or how to find this. I can see she has a follow-up with me in March, and I am comfortable providing the prescription until then unless there is further contraindication.

## 2018-07-01 NOTE — Telephone Encounter (Signed)
Needs refill on amitriptyline (ELAVIL) 25 MG tablet at [place], [place] - [address] ; pt contact [phone]

## 2018-07-04 NOTE — Telephone Encounter (Signed)
I added that when I ask for the refill dr Sharon Seller, at last visit pt was to f/u in 06/2018, pt cancelled feb appt and resch for march, this is a good reminder for pt to keep appt. It was not refusing the refill it was asking for the refill and adding no more until visit is complete. Sorry if this was misunderstood

## 2018-07-07 MED ORDER — AMITRIPTYLINE HCL 25 MG PO TABS: 25.0000 mg | ORAL_TABLET | Freq: Every day | ORAL | 0 refills | Status: DC

## 2018-07-07 NOTE — Telephone Encounter (Signed)
I see! Thanks for the clarification.

## 2018-07-16 ENCOUNTER — Encounter: Payer: Medicaid Other | Admitting: Internal Medicine

## 2018-08-06 ENCOUNTER — Encounter: Payer: Medicaid Other | Admitting: Internal Medicine

## 2018-08-21 ENCOUNTER — Ambulatory Visit (INDEPENDENT_AMBULATORY_CARE_PROVIDER_SITE_OTHER): Payer: Medicaid Other | Admitting: Internal Medicine

## 2018-08-21 ENCOUNTER — Other Ambulatory Visit: Payer: Self-pay

## 2018-08-21 DIAGNOSIS — T7840XA Allergy, unspecified, initial encounter: Secondary | ICD-10-CM | POA: Diagnosis not present

## 2018-08-21 DIAGNOSIS — J45909 Unspecified asthma, uncomplicated: Secondary | ICD-10-CM

## 2018-08-21 DIAGNOSIS — K219 Gastro-esophageal reflux disease without esophagitis: Secondary | ICD-10-CM | POA: Diagnosis not present

## 2018-08-21 DIAGNOSIS — I10 Essential (primary) hypertension: Secondary | ICD-10-CM

## 2018-08-21 NOTE — Progress Notes (Signed)
   CC: telehealth check-up  HPI:  [name] is a 63 y.o. with PMH as below. This is a telephone encounter between Kimberly [name] and Kimberly [name] on 08/22/2018 for telehealth checkup. The visit was conducted with the patient located at home and Kimberly [name] at [facility]. The patient's identity was confirmed using their DOB and current address. The patient has consented to being evaluated through a telephone encounter and understands the associated risks (an examination cannot be done and the patient may need to come in for an appointment) / benefits (allows the patient to remain at home, decreasing exposure to coronavirus). I personally spent 21 minutes on medical discussion.   Please see A&P for assessment of the patient's acute and chronic medical conditions.    Past Medical History:  Diagnosis Date  . Alcohol abuse, in remission    since around 2005  . Alcoholic hepatitis    fatty liver on imaging  . Allergic rhinitis   . Anemia    EGD with duodenal AVM, normal colonoscopy 04/2012  . Asthma   . Callus of foot 2009  . Chest pain, musculoskeletal 2011  . Cholelithiasis    [place] 09/2008: Cholelithiasis is present, s/p cholecystectomy  . Depression   . Excessive cerumen in ear canal    since before 2000  . GERD (gastroesophageal reflux disease)   . Hyperlipidemia   . Hypertension   . Menopausal syndrome    Treated with estradiol in the past - discontinued 04/2012  . Otitis externa, acute Feb 2012   bilateral, treated with azithromycin after failure of neomycin drops  . Otitis media, acute Feb 2012  . PONV (postoperative nausea and vomiting)    Review of Systems:   Review of Systems  Constitutional: Negative for fever and weight loss.  HENT: Positive for congestion. Negative for nosebleeds.   Respiratory: Negative for cough, shortness of breath and wheezing.   Cardiovascular: Negative for chest pain, palpitations and leg swelling.  Gastrointestinal: Negative for  abdominal pain, constipation, diarrhea, heartburn, nausea and vomiting.  Musculoskeletal: Negative for falls.  Neurological: Negative for dizziness and tingling.  Psychiatric/Behavioral: Negative for depression. The patient is not nervous/anxious.    Physical Exam: Unable to perform PE due to telephone visit.   There were no vitals filed for this visit.   Assessment & Plan:   See Encounters Tab for problem based charting.  Patient discussed with Dr. Daryll Drown

## 2018-08-22 MED ORDER — PANTOPRAZOLE SODIUM 40 MG PO TBEC: 40.0000 mg | DELAYED_RELEASE_TABLET | Freq: Every day | ORAL | 4 refills | Status: DC

## 2018-08-22 MED ORDER — LORATADINE 10 MG PO TABS: 10.0000 mg | ORAL_TABLET | Freq: Every day | ORAL | 2 refills | Status: DC

## 2018-08-22 NOTE — Assessment & Plan Note (Signed)
History of GERD for which she was switched to prilosec 20 MG qd. She wishes to switch back to protonix as she felt this worked better for her. She states she has mild symptoms of heart burn.   - cont. protonix 40 mg qd, stop prilosec

## 2018-08-22 NOTE — Assessment & Plan Note (Signed)
States she [facility] at [facility] and that it has been doing well when she takes her HCTZ 25 mg qd, which she misses occasionally. Denies changes in urination or LE swelling.   - cont. HCTZ 25 MG qd  - f/u three months - BMP at that time

## 2018-08-22 NOTE — Assessment & Plan Note (Signed)
She states she has had more congestion in her ears recently, more so on the left. Denies ear pain. States it is almost like a popping on occasion and also has nasal congestion and post-nasal drip. She states she frequently gets seasonal allergies in the spring.   - start claritin 10 MG qd

## 2018-08-22 NOTE — Assessment & Plan Note (Signed)
She states her albuterol inhaler has been working well for her. She denies SOB, CP, or dyspnea on exertion.   - cont. Albuterol

## 2018-08-27 NOTE — Progress Notes (Signed)
Internal Medicine Clinic Attending  Case discussed with Dr. Sharon Seller at the time of the visit.  We reviewed the resident's telephone note, history and medications.  I agree with the assessment, diagnosis, and plan of care documented in the resident's note.

## 2018-09-08 ENCOUNTER — Other Ambulatory Visit: Payer: Self-pay | Admitting: Internal Medicine

## 2018-09-08 DIAGNOSIS — F32A Depression, unspecified: Secondary | ICD-10-CM

## 2018-09-08 DIAGNOSIS — F329 Major depressive disorder, single episode, unspecified: Secondary | ICD-10-CM

## 2018-09-10 ENCOUNTER — Encounter: Payer: Medicaid Other | Admitting: Internal Medicine

## 2018-10-02 ENCOUNTER — Other Ambulatory Visit: Payer: Self-pay | Admitting: Oncology

## 2018-10-02 DIAGNOSIS — F32A Depression, unspecified: Secondary | ICD-10-CM

## 2018-10-02 DIAGNOSIS — F329 Major depressive disorder, single episode, unspecified: Secondary | ICD-10-CM

## 2018-10-10 ENCOUNTER — Other Ambulatory Visit: Payer: Self-pay

## 2018-10-10 NOTE — Telephone Encounter (Signed)
amitriptyline (ELAVIL) 25 MG tablet, REFILL REQUEST @  [place], [place] - [address] [phone] (Phone) [phone] (Fax)

## 2018-10-10 NOTE — Telephone Encounter (Signed)
Call pt about amitriptyline, which was refilled on 5/8. Stated no refills on bottle then [facility] . I called [facility] - stated rx placed on 'hold' b/c pt did not pick it up on the 8th but get it ready for her.

## 2018-11-06 ENCOUNTER — Other Ambulatory Visit: Payer: Self-pay | Admitting: Internal Medicine

## 2018-11-06 DIAGNOSIS — F329 Major depressive disorder, single episode, unspecified: Secondary | ICD-10-CM

## 2018-11-06 DIAGNOSIS — F32A Depression, unspecified: Secondary | ICD-10-CM

## 2018-11-28 ENCOUNTER — Other Ambulatory Visit: Payer: Self-pay

## 2018-11-28 ENCOUNTER — Observation Stay (HOSPITAL_COMMUNITY)
Admission: EM | Admit: 2018-11-28 | Discharge: 2018-11-29 | Disposition: A | Discharge status: Home / Self Care | Payer: Medicaid Other | Attending: Internal Medicine | Admitting: Internal Medicine

## 2018-11-28 ENCOUNTER — Encounter (HOSPITAL_COMMUNITY): Payer: Self-pay | Admitting: *Deleted

## 2018-11-28 DIAGNOSIS — D509 Iron deficiency anemia, unspecified: Principal | ICD-10-CM | POA: Insufficient documentation

## 2018-11-28 DIAGNOSIS — Z1159 Encounter for screening for other viral diseases: Secondary | ICD-10-CM | POA: Diagnosis not present

## 2018-11-28 DIAGNOSIS — K219 Gastro-esophageal reflux disease without esophagitis: Secondary | ICD-10-CM | POA: Insufficient documentation

## 2018-11-28 DIAGNOSIS — F329 Major depressive disorder, single episode, unspecified: Secondary | ICD-10-CM | POA: Insufficient documentation

## 2018-11-28 DIAGNOSIS — Z87891 Personal history of nicotine dependence: Secondary | ICD-10-CM | POA: Insufficient documentation

## 2018-11-28 DIAGNOSIS — Z79899 Other long term (current) drug therapy: Secondary | ICD-10-CM | POA: Insufficient documentation

## 2018-11-28 DIAGNOSIS — J45909 Unspecified asthma, uncomplicated: Secondary | ICD-10-CM | POA: Insufficient documentation

## 2018-11-28 DIAGNOSIS — E785 Hyperlipidemia, unspecified: Secondary | ICD-10-CM | POA: Diagnosis not present

## 2018-11-28 DIAGNOSIS — I10 Essential (primary) hypertension: Secondary | ICD-10-CM | POA: Insufficient documentation

## 2018-11-28 DIAGNOSIS — D649 Anemia, unspecified: Secondary | ICD-10-CM | POA: Diagnosis present

## 2018-11-28 DIAGNOSIS — Z7951 Long term (current) use of inhaled steroids: Secondary | ICD-10-CM | POA: Diagnosis not present

## 2018-11-28 DIAGNOSIS — F1011 Alcohol abuse, in remission: Secondary | ICD-10-CM | POA: Insufficient documentation

## 2018-11-28 DIAGNOSIS — R531 Weakness: Secondary | ICD-10-CM | POA: Insufficient documentation

## 2018-11-28 LAB — CBC
HCT: 24.9 % — ABNORMAL LOW (ref 36.0–46.0)
HCT: 34.4 % — ABNORMAL LOW (ref 36.0–46.0)
Hemoglobin: 7 g/dL — ABNORMAL LOW (ref 12.0–15.0)
Hemoglobin: 10.5 g/dL — ABNORMAL LOW (ref 12.0–15.0)
MCH: 18.8 pg — ABNORMAL LOW (ref 26.0–34.0)
MCH: 21.8 pg — ABNORMAL LOW (ref 26.0–34.0)
MCHC: 28.1 g/dL — ABNORMAL LOW (ref 30.0–36.0)
MCHC: 30.5 g/dL (ref 30.0–36.0)
MCV: 66.9 fL — ABNORMAL LOW (ref 80.0–100.0)
MCV: 71.4 fL — ABNORMAL LOW (ref 80.0–100.0)
Platelets: 445 10*3/uL — ABNORMAL HIGH (ref 150–400)
Platelets: 394 10*3/uL (ref 150–400)
RBC: 3.72 MIL/uL — ABNORMAL LOW (ref 3.87–5.11)
RBC: 4.82 MIL/uL (ref 3.87–5.11)
RDW: 17.2 % — ABNORMAL HIGH (ref 11.5–15.5)
RDW: 21.1 % — ABNORMAL HIGH (ref 11.5–15.5)
WBC: 5.1 10*3/uL (ref 4.0–10.5)
WBC: 8 10*3/uL (ref 4.0–10.5)
nRBC: 0 % (ref 0.0–0.2)
nRBC: 0 % (ref 0.0–0.2)

## 2018-11-28 LAB — CBC WITH DIFFERENTIAL/PLATELET
Abs Immature Granulocytes: 0.01 10*3/uL (ref 0.00–0.07)
Basophils Absolute: 0.1 10*3/uL (ref 0.0–0.1)
Basophils Relative: 1 %
Eosinophils Absolute: 0.1 10*3/uL (ref 0.0–0.5)
Eosinophils Relative: 3 %
HCT: 24 % — ABNORMAL LOW (ref 36.0–46.0)
Hemoglobin: 6.7 g/dL — CL (ref 12.0–15.0)
Immature Granulocytes: 0 %
Lymphocytes Relative: 19 %
Lymphs Abs: 1 10*3/uL (ref 0.7–4.0)
MCH: 18.9 pg — ABNORMAL LOW (ref 26.0–34.0)
MCHC: 27.9 g/dL — ABNORMAL LOW (ref 30.0–36.0)
MCV: 67.6 fL — ABNORMAL LOW (ref 80.0–100.0)
Monocytes Absolute: 0.4 10*3/uL (ref 0.1–1.0)
Monocytes Relative: 8 %
Neutro Abs: 3.5 10*3/uL (ref 1.7–7.7)
Neutrophils Relative %: 69 %
Platelets: 422 10*3/uL — ABNORMAL HIGH (ref 150–400)
RBC: 3.55 MIL/uL — ABNORMAL LOW (ref 3.87–5.11)
RDW: 17.2 % — ABNORMAL HIGH (ref 11.5–15.5)
WBC: 5.1 10*3/uL (ref 4.0–10.5)
nRBC: 0 % (ref 0.0–0.2)

## 2018-11-28 LAB — COMPREHENSIVE METABOLIC PANEL
ALT: 11 U/L (ref 0–44)
AST: 20 U/L (ref 15–41)
Albumin: 3.5 g/dL (ref 3.5–5.0)
Alkaline Phosphatase: 81 U/L (ref 38–126)
Anion gap: 9 (ref 5–15)
BUN: <5 mg/dL — ABNORMAL LOW (ref 8–23)
CO2: 27 mmol/L (ref 22–32)
Calcium: 8.8 mg/dL — ABNORMAL LOW (ref 8.9–10.3)
Chloride: 100 mmol/L (ref 98–111)
Creatinine, Ser: 0.74 mg/dL (ref 0.44–1.00)
GFR calc Af Amer: >60 mL/min (ref >60)
GFR calc non Af Amer: >60 mL/min (ref >60)
Glucose, Bld: 106 mg/dL — ABNORMAL HIGH (ref 70–99)
Potassium: 3.6 mmol/L (ref 3.5–5.1)
Sodium: 136 mmol/L (ref 135–145)
Total Bilirubin: 0.9 mg/dL (ref 0.3–1.2)
Total Protein: 6.6 g/dL (ref 6.5–8.1)

## 2018-11-28 LAB — POC OCCULT BLOOD, ED: Fecal Occult Bld: NEGATIVE

## 2018-11-28 LAB — CBG MONITORING, ED: Glucose-Capillary: 97 mg/dL (ref 70–99)

## 2018-11-28 LAB — URINALYSIS, ROUTINE W REFLEX MICROSCOPIC
Bilirubin Urine: NEGATIVE
Glucose, UA: NEGATIVE mg/dL
Hgb urine dipstick: NEGATIVE
Ketones, ur: NEGATIVE mg/dL
Nitrite: NEGATIVE
Protein, ur: NEGATIVE mg/dL
Specific Gravity, Urine: 1.004 — ABNORMAL LOW (ref 1.005–1.030)
pH: 7 (ref 5.0–8.0)

## 2018-11-28 LAB — PREPARE RBC (CROSSMATCH)

## 2018-11-28 LAB — FERRITIN: Ferritin: 5 ng/mL — ABNORMAL LOW (ref 11–307)

## 2018-11-28 LAB — SARS CORONAVIRUS 2 BY RT PCR (HOSPITAL ORDER, PERFORMED IN ~~LOC~~ HOSPITAL LAB): SARS Coronavirus 2: NEGATIVE

## 2018-11-28 LAB — RETICULOCYTES
Immature Retic Fract: 20.1 % — ABNORMAL HIGH (ref 2.3–15.9)
RBC.: 3.71 MIL/uL — ABNORMAL LOW (ref 3.87–5.11)
Retic Count, Absolute: 23.4 10*3/uL (ref 19.0–186.0)
Retic Ct Pct: 0.6 % (ref 0.4–3.1)

## 2018-11-28 LAB — IRON AND TIBC
Iron: 9 ug/dL — ABNORMAL LOW (ref 28–170)
Saturation Ratios: 2 % — ABNORMAL LOW (ref 10.4–31.8)
TIBC: 498 ug/dL — ABNORMAL HIGH (ref 250–450)
UIBC: 489 ug/dL

## 2018-11-28 MED ORDER — ENOXAPARIN SODIUM 40 MG/0.4ML ~~LOC~~ SOLN
40.0000 mg | SUBCUTANEOUS | Status: DC
  Administered 2018-11-28: 40 mg via SUBCUTANEOUS
  Filled 2018-11-28: qty 0.4

## 2018-11-28 MED ORDER — HYDROCORTISONE 1 % EX CREA
TOPICAL_CREAM | Freq: Every day | CUTANEOUS | Status: DC | PRN
  Administered 2018-11-29: via TOPICAL
  Filled 2018-11-28: qty 28

## 2018-11-28 MED ORDER — ALBUTEROL SULFATE (2.5 MG/3ML) 0.083% IN NEBU: 2.5000 mg | INHALATION_SOLUTION | RESPIRATORY_TRACT | Status: DC | PRN

## 2018-11-28 MED ORDER — SODIUM CHLORIDE 0.9% FLUSH
3.0000 mL | Freq: Two times a day (BID) | INTRAVENOUS | Status: DC
  Administered 2018-11-28 – 2018-11-29 (×2): 3 mL via INTRAVENOUS

## 2018-11-28 MED ORDER — PRAVASTATIN SODIUM 40 MG PO TABS
40.0000 mg | ORAL_TABLET | Freq: Every evening | ORAL | Status: DC
  Administered 2018-11-28: 40 mg via ORAL
  Filled 2018-11-28 (×2): qty 1

## 2018-11-28 MED ORDER — HYDROCHLOROTHIAZIDE 25 MG PO TABS
25.0000 mg | ORAL_TABLET | Freq: Every day | ORAL | Status: DC
  Administered 2018-11-29: 25 mg via ORAL
  Filled 2018-11-28: qty 1

## 2018-11-28 MED ORDER — FLUTICASONE PROPIONATE HFA 110 MCG/ACT IN AERO: 2.0000 | INHALATION_SPRAY | Freq: Two times a day (BID) | RESPIRATORY_TRACT | Status: DC

## 2018-11-28 MED ORDER — SODIUM CHLORIDE 0.9 % IV SOLN
10.0000 mL/h | Freq: Once | INTRAVENOUS | Status: AC
  Administered 2018-11-28: 10 mL/h via INTRAVENOUS

## 2018-11-28 MED ORDER — AMITRIPTYLINE HCL 25 MG PO TABS
25.0000 mg | ORAL_TABLET | Freq: Every day | ORAL | Status: DC
  Administered 2018-11-28: 25 mg via ORAL
  Filled 2018-11-28: qty 1

## 2018-11-28 MED ORDER — ACETAMINOPHEN 650 MG RE SUPP: 650.0000 mg | Freq: Four times a day (QID) | RECTAL | Status: DC | PRN

## 2018-11-28 MED ORDER — BUDESONIDE 0.5 MG/2ML IN SUSP
0.5000 mg | Freq: Two times a day (BID) | RESPIRATORY_TRACT | Status: DC
  Administered 2018-11-28: 0.5 mg via RESPIRATORY_TRACT
  Filled 2018-11-28 (×2): qty 2

## 2018-11-28 MED ORDER — PANTOPRAZOLE SODIUM 40 MG PO TBEC
40.0000 mg | DELAYED_RELEASE_TABLET | Freq: Every day | ORAL | Status: DC
  Administered 2018-11-29: 40 mg via ORAL
  Filled 2018-11-28: qty 1

## 2018-11-28 MED ORDER — ACETAMINOPHEN 325 MG PO TABS: 650.0000 mg | ORAL_TABLET | Freq: Four times a day (QID) | ORAL | Status: DC | PRN

## 2018-11-28 NOTE — ED Notes (Signed)
Micro adding culture

## 2018-11-28 NOTE — Plan of Care (Signed)
  Problem: Education: Goal: Knowledge of General Education information will improve Description: Including pain rating scale, medication(s)/side effects and non-pharmacologic comfort measures Outcome: Progressing   Problem: Health Behavior/Discharge Planning: Goal: Ability to manage health-related needs will improve Outcome: Progressing   Problem: Clinical Measurements: Goal: Ability to maintain clinical measurements within normal limits will improve Outcome: Progressing   Problem: Nutrition: Goal: Adequate nutrition will be maintained Outcome: Progressing   Problem: Clinical Measurements: Goal: Diagnostic test results will improve Outcome: Progressing   Problem: Skin Integrity: Goal: Risk for impaired skin integrity will decrease Outcome: Progressing   Problem: Nutrition: Goal: Adequate nutrition will be maintained Outcome: Progressing

## 2018-11-28 NOTE — ED Notes (Signed)
Critical Hgb given to [facility]. [place]

## 2018-11-28 NOTE — ED Notes (Signed)
Patient is aware she needs to give a urine spec.

## 2018-11-28 NOTE — ED Notes (Signed)
Patient states she started having numbness in her left arm at 8am today c/o dizziness, states she thinks her iron level may be low she is constantly eating ice. States her arm feels better now. Bilateral grips strong and equal.

## 2018-11-28 NOTE — ED Notes (Signed)
[place] call for an update/ride

## 2018-11-28 NOTE — ED Provider Notes (Signed)
[facility] EMERGENCY DEPARTMENT Provider Note   CSN: [id number] Arrival date & time: 11/28/18  0910    History   Chief Complaint Chief Complaint  Patient presents with  . Arm Pain    HPI Kimberly [name] is a 63 y.o. female with history of longstanding anemia, hypertension, hyperlipidemia, GERD, depression who presents with a several month history of generalized weakness and fatigue and lightheadedness, worse with standing up patient also reports a several year history of intermittent upper back pain when rating into her arm.  She feels like it is gas, as it is mostly after eating.  She denies any chest pain, shortness of breath, cough, fever, abdominal pain, nausea, vomiting, bloody stools.  She does note that she has had some urinary frequency at times.  Patient reports she has been eating ice.     HPI  Past Medical History:  Diagnosis Date  . Alcohol abuse, in remission    since around 2005  . Alcoholic hepatitis    fatty liver on imaging  . Allergic rhinitis   . Anemia    EGD with duodenal AVM, normal colonoscopy 04/2012  . Asthma   . Callus of foot 2009  . Chest pain, musculoskeletal 2011  . Cholelithiasis    [place] 09/2008: Cholelithiasis is present, s/p cholecystectomy  . Depression   . Excessive cerumen in ear canal    since before 2000  . GERD (gastroesophageal reflux disease)   . Hyperlipidemia   . Hypertension   . Menopausal syndrome    Treated with estradiol in the past - discontinued 04/2012  . Otitis externa, acute Feb 2012   bilateral, treated with azithromycin after failure of neomycin drops  . Otitis media, acute Feb 2012  . PONV (postoperative nausea and vomiting)     Patient Active Problem List   Diagnosis Date Noted  . Pain in right thigh 01/15/2018  . Health care maintenance 03/25/2013  . Depression 12/03/2012  . Microcytic anemia 05/08/2012  . Excessive cerumen in ear canal 02/08/2011  . HLD (hyperlipidemia) 03/25/2008  .  Essential hypertension 09/24/2006  . Allergies 09/24/2006  . Asthma 09/24/2006  . GERD 09/24/2006  . MENOPAUSAL SYNDROME 09/24/2006    Past Surgical History:  Procedure Laterality Date  . ABDOMINAL HYSTERECTOMY  1980s  . CHOLECYSTECTOMY  01/06/09  . COLONOSCOPY  05/09/2012   Procedure: COLONOSCOPY;  Surgeon: Inda Castle, MD;  Location: [facility];  Service: Endoscopy;  Laterality: N/A;  . ESOPHAGOGASTRODUODENOSCOPY  05/09/2012   Procedure: ESOPHAGOGASTRODUODENOSCOPY (EGD);  Surgeon: Inda Castle, MD;  Location: [facility];  Service: Endoscopy;  Laterality: N/A;     OB History   No obstetric history on file.      Home Medications    Prior to Admission medications   Medication Sig Start Date End Date Taking? Authorizing Provider  albuterol (PROVENTIL HFA;VENTOLIN HFA) 108 (90 Base) MCG/ACT inhaler Inhale 2 puffs into the lungs every 4 (four) hours as needed for wheezing. 01/18/17 11/28/18 Yes Shela Leff, MD  amitriptyline (ELAVIL) 25 MG tablet TAKE 1 TABLET BY MOUTH AT BEDTIME (PATIENT  NEEDS  A  FOLLOW  UP  APPOINTMENT) Patient taking differently: Take 25 mg by mouth at bedtime.  11/07/18  Yes Axel Filler, MD  fluticasone (FLOVENT HFA) 110 MCG/ACT inhaler Inhale 2 puffs into the lungs 2 (two) times daily. 01/15/18  Yes Seawell, Jaimie A, DO  hydrochlorothiazide (HYDRODIURIL) 25 MG tablet Take 1 tablet (25 mg total) by mouth daily. 01/15/18  Yes Seawell, Jaimie A, DO  loratadine (CLARITIN) 10 MG tablet Take 1 tablet (10 mg total) by mouth daily. Patient taking differently: Take 10 mg by mouth daily as needed for allergies.  08/22/18 11/28/18 Yes Seawell, Jaimie A, DO  pantoprazole (PROTONIX) 40 MG tablet Take 1 tablet (40 mg total) by mouth daily. 08/22/18  Yes Seawell, Jaimie A, DO  pravastatin (PRAVACHOL) 40 MG tablet Take 1 tablet (40 mg total) by mouth every evening. 01/15/18 01/15/19 Yes Seawell, Jaimie A, DO    Family History Family History  Problem  Relation Age of Onset  . Hypertension Mother     Social History Social History   Tobacco Use  . Smoking status: Former Smoker    Types: Cigarettes    Quit date: 08/21/1995    Years since quitting: 23.2  . Smokeless tobacco: Never Used  Substance Use Topics  . Alcohol use: No    Comment: in remission since 2007  . Drug use: No     Allergies   Banana, Grape (artificial) flavor, Ibuprofen, and Penicillins   Review of Systems Review of Systems  Constitutional: Positive for fatigue. Negative for chills and fever.  HENT: Negative for facial swelling and sore throat.   Respiratory: Negative for shortness of breath.   Cardiovascular: Negative for chest pain.  Gastrointestinal: Negative for abdominal pain, blood in stool, diarrhea, nausea and vomiting.  Genitourinary: Positive for frequency. Negative for dysuria.  Musculoskeletal: Positive for back pain.  Skin: Negative for rash and wound.  Neurological: Positive for light-headedness. Negative for dizziness and headaches.  Psychiatric/Behavioral: The patient is not nervous/anxious.      Physical Exam Updated Vital Signs BP 121/72   Pulse 78   Temp 98.7 F (37.1 C) (Oral)   Resp 17   Ht 5\' 4"  (1.626 m)   Wt 45.4 kg   LMP 08/21/1982   SpO2 100%   BMI 17.16 kg/m   Physical Exam Vitals signs and nursing note reviewed.  Constitutional:      General: She is not in acute distress.    Appearance: She is well-developed. She is not diaphoretic.  HENT:     Head: Normocephalic and atraumatic.     Mouth/Throat:     Pharynx: No oropharyngeal exudate.  Eyes:     General: No scleral icterus.       Right eye: No discharge.        Left eye: No discharge.     Pupils: Pupils are equal, round, and reactive to light.     Comments: Pale conjunctiva  Neck:     Musculoskeletal: Normal range of motion and neck supple.     Thyroid: No thyromegaly.  Cardiovascular:     Rate and Rhythm: Normal rate and regular rhythm.     Heart  sounds: Normal heart sounds. No murmur. No friction rub. No gallop.   Pulmonary:     Effort: Pulmonary effort is normal. No respiratory distress.     Breath sounds: Normal breath sounds. No stridor. No wheezing or rales.  Abdominal:     General: Bowel sounds are normal. There is no distension.     Palpations: Abdomen is soft.     Tenderness: There is no abdominal tenderness. There is no guarding or rebound.  Lymphadenopathy:     Cervical: No cervical adenopathy.  Skin:    General: Skin is warm and dry.     Coloration: Skin is pale.     Findings: No rash.  Neurological:  Mental Status: She is alert.     Coordination: Coordination normal.     Comments: CN 3-12 intact; normal sensation throughout; 5/5 strength in all 4 extremities; equal bilateral grip strength      ED Treatments / Results  Labs (all labs ordered are listed, but only abnormal results are displayed) Labs Reviewed  CBC WITH DIFFERENTIAL/PLATELET - Abnormal; Notable for the following components:      Result Value   RBC 3.55 (*)    Hemoglobin 6.7 (*)    HCT 24.0 (*)    MCV 67.6 (*)    MCH 18.9 (*)    MCHC 27.9 (*)    RDW 17.2 (*)    Platelets 422 (*)    All other components within normal limits  COMPREHENSIVE METABOLIC PANEL - Abnormal; Notable for the following components:   Glucose, Bld 106 (*)    BUN <5 (*)    Calcium 8.8 (*)    All other components within normal limits  URINALYSIS, ROUTINE W REFLEX MICROSCOPIC - Abnormal; Notable for the following components:   Color, Urine STRAW (*)    Specific Gravity, Urine 1.004 (*)    Leukocytes,Ua LARGE (*)    Bacteria, UA RARE (*)    All other components within normal limits  URINE CULTURE  SARS CORONAVIRUS 2 (HOSPITAL ORDER, [facility])  OCCULT BLOOD X 1 CARD TO LAB, STOOL  IRON AND TIBC  FERRITIN  CBG MONITORING, ED  POC OCCULT BLOOD, ED  TYPE AND SCREEN  PREPARE RBC (CROSSMATCH)    EKG EKG Interpretation  Date/Time:   Friday November 28 2018 09:15:26 EDT Ventricular Rate:  70 PR Interval:    QRS Duration: 87 QT Interval:  442 QTC Calculation: 477 R Axis:   78 Text Interpretation:  Sinus rhythm No significant change since last tracing Confirmed by Deno Etienne [id number]) on 11/28/2018 9:20:10 AM   Radiology No results found.  Procedures .Critical Care Performed by: Frederica Kuster, PA-C Authorized by: Frederica Kuster, PA-C   Critical care provider statement:    Critical care time (minutes):  45   Critical care was necessary to treat or prevent imminent or life-threatening deterioration of the following conditions:  Circulatory failure   Critical care was time spent personally by me on the following activities:  Discussions with consultants, evaluation of patient's response to treatment, examination of patient, ordering and performing treatments and interventions, ordering and review of laboratory studies, ordering and review of radiographic studies, pulse oximetry, re-evaluation of patient's condition, obtaining history from patient or surrogate and review of old charts   I assumed direction of critical care for this patient from another provider in my specialty: no   Comments:     Patient requiring blood transfusion   (including critical care time)  Medications Ordered in ED Medications  0.9 %  sodium chloride infusion (has no administration in time range)     Initial Impression / Assessment and Plan / ED Course  I have reviewed the triage vital signs and the nursing notes.  Pertinent labs & imaging results that were available during my care of the patient were reviewed by me and considered in my medical decision making (see chart for details).        Patient presenting with generalized weakness, lightheadedness.  She is found to have hemoglobin of 6.7.  She accepts blood transfusion.  Fecal occult is negative.  Patient has history of anemia and reports being on iron in the past,  but is not been  on it for the past several years.  She denies any active bleeding that she has noticed.  Feel patient may benefit from further work-up and management of her anemia as well as observation in case she is requiring more than 2 units.  I discussed patient case with internal medicine teaching service who accepts patient for admission.  I appreciate their assistance with the patient. I discussed patient case with Dr. Tyrone Nine who guided the patient's management and agrees with plan.   Final Clinical Impressions(s) / ED Diagnoses   Final diagnoses:  Anemia, unspecified type    ED Discharge Orders    None       Frederica Kuster, PA-C 11/28/18 [place], DO 11/28/18 [place], DO 11/28/18 1420

## 2018-11-28 NOTE — H&P (Signed)
Date: 11/28/2018               Patient Name:  Kimberly [name] MRN: [id number]  DOB: Jun 10, 1955 Age / Sex: 63 y.o., female   PCP: [name], DO         Medical Service: Internal Medicine Teaching Service         Attending Physician: Dr. Bartholomew Crews, MD    First Contact: Dr. Court Joy Pager: [phone]  Second Contact: Dr. Trilby Drummer Pager: [phone]       After Hours (After 5p/  First Contact Pager: [phone]  weekends / holidays): Second Contact Pager: [phone]   Chief Complaint: Tired , dizziness  History of Present Illness:   Kimberly [name] is a 63 year old F with HTN, HLD, GERD, depression and iron deficiency anemia who presents to ED after feeling extra tired, experiencing dizziness, and eating a lot of ice for the past few days. She says she has felt tired for the past few months , but feels it has been worse for the past few days. This is the same way she has felt in the past when she was found to be anemic. She has GERD and felt it has been worse lately.  She has no other associated symptoms  Denies any recent illness , bleeding, melena, or abdominal pain.   Last EGD and Colonoscopy in 2013. Normal Colonoscopy. 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 [facility] and records show she has history of iron deficiency anemia.Last seen for this problem in 2017 , but last visit to clinic was recently in August.   Meds:  Current Meds  Medication Sig  . albuterol (PROVENTIL HFA;VENTOLIN HFA) 108 (90 Base) MCG/ACT inhaler Inhale 2 puffs into the lungs every 4 (four) hours as needed for wheezing.  Marland Kitchen amitriptyline (ELAVIL) 25 MG tablet TAKE 1 TABLET BY MOUTH AT BEDTIME (PATIENT  NEEDS  A  FOLLOW  UP  APPOINTMENT) (Patient taking differently: Take 25 mg by mouth at bedtime. )  . fluticasone (FLOVENT HFA) 110 MCG/ACT inhaler Inhale 2 puffs into the lungs 2 (two) times daily.  . hydrochlorothiazide (HYDRODIURIL) 25 MG tablet Take 1 tablet (25 mg total) by  mouth daily.  Marland Kitchen loratadine (CLARITIN) 10 MG tablet Take 1 tablet (10 mg total) by mouth daily. (Patient taking differently: Take 10 mg by mouth daily as needed for allergies. )  . pantoprazole (PROTONIX) 40 MG tablet Take 1 tablet (40 mg total) by mouth daily.  . pravastatin (PRAVACHOL) 40 MG tablet Take 1 tablet (40 mg total) by mouth every evening.     Allergies: Allergies as of 11/28/2018 - Review Complete 11/28/2018  Allergen Reaction Noted  . Banana  02/08/2011  . Grape (artificial) flavor  02/08/2011  . Ibuprofen    . Penicillins Rash    Past Medical History:  Diagnosis Date  . Alcohol abuse, in remission    since around 2005  . Alcoholic hepatitis    fatty liver on imaging  . Allergic rhinitis   . Anemia    EGD with duodenal AVM, normal colonoscopy 04/2012  . Asthma   . Callus of foot 2009  . Chest pain, musculoskeletal 2011  . Cholelithiasis    [place] 09/2008: Cholelithiasis is present, s/p cholecystectomy  . Depression   . Excessive cerumen in ear canal    since before 2000  . GERD (gastroesophageal reflux disease)   . Hyperlipidemia   . Hypertension   .  Menopausal syndrome    Treated with estradiol in the past - discontinued 04/2012  . Otitis externa, acute Feb 2012   bilateral, treated with azithromycin after failure of neomycin drops  . Otitis media, acute Feb 2012  . PONV (postoperative nausea and vomiting)     Family History:  Family History  Problem Relation Age of Onset  . Hypertension Mother     Social History:  Social History   Tobacco Use  . Smoking status: Former Smoker    Types: Cigarettes    Quit date: 08/21/1995    Years since quitting: 23.2  . Smokeless tobacco: Never Used  Substance Use Topics  . Alcohol use: No    Comment: in remission since 2007  . Drug use: No    Review of Systems: A complete ROS was negative except as per HPI. Physical Exam: Blood pressure 120/74, pulse 65, temperature 98.5 F (36.9 C), temperature source  Oral, resp. rate 15, height 5\' 4"  (1.626 m), weight 45.4 kg, last menstrual period 08/21/1982, SpO2 100 %.  Physical Exam Constitutional:      General: She is not in acute distress.    Appearance: Normal appearance.  HENT:     Mouth/Throat:     Mouth: Mucous membranes are dry.  Eyes:     Comments: Pale conjuctivae  Cardiovascular:     Rate and Rhythm: Normal rate.  Pulmonary:     Effort: Pulmonary effort is normal.     Breath sounds: Normal breath sounds.  Abdominal:     General: Bowel sounds are normal.     Palpations: Abdomen is soft.  Skin:    General: Skin is warm and dry.     Findings: No bruising.  Neurological:     Mental Status: She is alert.  Psychiatric:        Mood and Affect: Mood normal.        Behavior: Behavior normal.     Assessment & Plan by Problem: Active Problems:   Symptomatic anemia  Kimberly [name] is a 63 year old F iron deficiency anemia who presents to ED feeling tired, experiencing dizziness and endorsing pica.    Anemia Hgb 6.7, MCV 66.9 in ED. Fecal occult negative. Is not currently taking Iron , but has history of iron deficiency anemia. Symptoms as above. Receiving 2 units of PRBC in ED. Will check labs and monitor overnight.   - Ferritin, Iron labs - Trend CBC  GERD - C/w home protonix  Depression - C/w home amitriptyline  HLD - C/w home pravastatin   Diet: Regular DVT PPx : Lovenox Code: DNR  Dispo: Admit patient to Observation with expected length of stay less than 2 midnights.  Signed: Tamsen Snider, MD PGY1  [phone]

## 2018-11-28 NOTE — ED Notes (Signed)
Called Daughter Magda Paganini [phone] updated on patient status .

## 2018-11-28 NOTE — ED Notes (Signed)
ED TO INPATIENT HANDOFF REPORT  ED Nurse Name and Phone #: Ophelia Charter RN [phone]  S Name/Age/Gender Kimberly [name] 63 y.o. female Room/Bed: 016C/016C  Code Status   Code Status: DNR  Home/SNF/Other Home Patient oriented to: self, place, time and situation Is this baseline? Yes   Triage Complete: Triage complete  Chief Complaint Numbness, dizzy  Triage Note No notes on file   Allergies Allergies  Allergen Reactions  . Banana     Lip swelling  . Grape (Artificial) Flavor     Allergic to grapes-swelling   . Ibuprofen     REACTION: rash  . Penicillins Rash    Did it involve swelling of the face/tongue/throat, SOB, or low BP? No Did it involve sudden or severe rash/hives, skin peeling, or any reaction on the inside of your mouth or nose? No Did you need to seek medical attention at a hospital or doctor's office? No When did it last happen?more than 10 years If all above answers are "NO", may proceed with cephalosporin use.     Level of Care/Admitting Diagnosis ED Disposition    ED Disposition Condition Comment   Admit  Hospital Area: [facility] [[id number]]  Level of Care: Telemetry Medical [104]  Covid Evaluation: Asymptomatic Screening Protocol (No Symptoms)  Diagnosis: Symptomatic anemia [[id number]]  Admitting Physician: Aline Brochure  Attending Physician: Larey Dresser A [[id number]]  PT Class (Do Not Modify): Observation [104]  PT Acc Code (Do Not Modify): Observation [10022]       B Medical/Surgery History Past Medical History:  Diagnosis Date  . Alcohol abuse, in remission    since around 2005  . Alcoholic hepatitis    fatty liver on imaging  . Allergic rhinitis   . Anemia    EGD with duodenal AVM, normal colonoscopy 04/2012  . Asthma   . Callus of foot 2009  . Chest pain, musculoskeletal 2011  . Cholelithiasis    [place] 09/2008: Cholelithiasis is present, s/p cholecystectomy  . Depression   . Excessive cerumen  in ear canal    since before 2000  . GERD (gastroesophageal reflux disease)   . Hyperlipidemia   . Hypertension   . Menopausal syndrome    Treated with estradiol in the past - discontinued 04/2012  . Otitis externa, acute Feb 2012   bilateral, treated with azithromycin after failure of neomycin drops  . Otitis media, acute Feb 2012  . PONV (postoperative nausea and vomiting)    Past Surgical History:  Procedure Laterality Date  . ABDOMINAL HYSTERECTOMY  1980s  . CHOLECYSTECTOMY  01/06/09  . COLONOSCOPY  05/09/2012   Procedure: COLONOSCOPY;  Surgeon: Inda Castle, MD;  Location: [facility];  Service: Endoscopy;  Laterality: N/A;  . ESOPHAGOGASTRODUODENOSCOPY  05/09/2012   Procedure: ESOPHAGOGASTRODUODENOSCOPY (EGD);  Surgeon: Inda Castle, MD;  Location: [facility];  Service: Endoscopy;  Laterality: N/A;     A IV Location/Drains/Wounds Patient Lines/Drains/Airways Status   Active Line/Drains/Airways    Name:   Placement date:   Placement time:   Site:   Days:   Peripheral IV 11/28/18 Left Forearm   11/28/18    0926    Forearm   less than 1          Intake/Output Last 24 hours No intake or output data in the 24 hours ending 11/28/18 1436  Labs/Imaging Results for orders placed or performed during the hospital encounter of 11/28/18 (from the past 48 hour(s))  CBG monitoring, ED  Status: None   Collection Time: 11/28/18  9:16 AM  Result Value Ref Range   Glucose-Capillary 97 70 - 99 mg/dL   Comment 1 Notify RN    Comment 2 Document in Chart   CBC with Differential     Status: Abnormal   Collection Time: 11/28/18  9:44 AM  Result Value Ref Range   WBC 5.1 4.0 - 10.5 K/uL   RBC 3.55 (L) 3.87 - 5.11 MIL/uL   Hemoglobin 6.7 (LL) 12.0 - 15.0 g/dL    Comment: REPEATED TO VERIFY Reticulocyte Hemoglobin testing may be clinically indicated, consider ordering this additional test [id number] THIS CRITICAL RESULT HAS VERIFIED AND BEEN CALLED TO K Keyna Blizard RN BY ALLISON  BENNETT ON 07 03 2020 AT 2536, AND HAS BEEN READ BACK.     HCT 24.0 (L) 36.0 - 46.0 %   MCV 67.6 (L) 80.0 - 100.0 fL   MCH 18.9 (L) 26.0 - 34.0 pg   MCHC 27.9 (L) 30.0 - 36.0 g/dL   RDW 17.2 (H) 11.5 - 15.5 %   Platelets 422 (H) 150 - 400 K/uL   nRBC 0.0 0.0 - 0.2 %   Neutrophils Relative % 69 %   Neutro Abs 3.5 1.7 - 7.7 K/uL   Lymphocytes Relative 19 %   Lymphs Abs 1.0 0.7 - 4.0 K/uL   Monocytes Relative 8 %   Monocytes Absolute 0.4 0.1 - 1.0 K/uL   Eosinophils Relative 3 %   Eosinophils Absolute 0.1 0.0 - 0.5 K/uL   Basophils Relative 1 %   Basophils Absolute 0.1 0.0 - 0.1 K/uL   Immature Granulocytes 0 %   Abs Immature Granulocytes 0.01 0.00 - 0.07 K/uL    Comment: Performed at [facility] [address] [place], [place] [address]  Comprehensive metabolic panel     Status: Abnormal   Collection Time: 11/28/18  9:44 AM  Result Value Ref Range   Sodium 136 135 - 145 mmol/L   Potassium 3.6 3.5 - 5.1 mmol/L   Chloride 100 98 - 111 mmol/L   CO2 27 22 - 32 mmol/L   Glucose, Bld 106 (H) 70 - 99 mg/dL   BUN <5 (L) 8 - 23 mg/dL   Creatinine, Ser 0.74 0.44 - 1.00 mg/dL   Calcium 8.8 (L) 8.9 - 10.3 mg/dL   Total Protein 6.6 6.5 - 8.1 g/dL   Albumin 3.5 3.5 - 5.0 g/dL   AST 20 15 - 41 U/L   ALT 11 0 - 44 U/L   Alkaline Phosphatase 81 38 - 126 U/L   Total Bilirubin 0.9 0.3 - 1.2 mg/dL   GFR calc non Af Amer >60 >60 mL/min   GFR calc Af Amer >60 >60 mL/min   Anion gap 9 5 - 15    Comment: Performed at [facility] [address], [place], [place] [address]  Urinalysis, Routine w reflex microscopic     Status: Abnormal   Collection Time: 11/28/18  9:46 AM  Result Value Ref Range   Color, Urine STRAW (A) YELLOW   APPearance CLEAR CLEAR   Specific Gravity, Urine 1.004 (L) 1.005 - 1.030   pH 7.0 5.0 - 8.0   Glucose, UA NEGATIVE NEGATIVE mg/dL   Hgb urine dipstick NEGATIVE NEGATIVE   Bilirubin Urine NEGATIVE NEGATIVE   Ketones, ur NEGATIVE NEGATIVE mg/dL    Protein, ur NEGATIVE NEGATIVE mg/dL   Nitrite NEGATIVE NEGATIVE   Leukocytes,Ua LARGE (A) NEGATIVE   RBC / HPF 0-5  0 - 5 RBC/hpf   WBC, UA 0-5 0 - 5 WBC/hpf   Bacteria, UA RARE (A) NONE SEEN   Squamous Epithelial / LPF 0-5 0 - 5    Comment: Performed at [facility], [place] [address], [place], [place] [address]  Type and screen Java     Status: None (Preliminary result)   Collection Time: 11/28/18  9:56 AM  Result Value Ref Range   ABO/RH(D) O POS    Antibody Screen NEG    Sample Expiration 12/01/2018,2359    Unit Number [id number]    Blood Component Type RED CELLS,LR    Unit division 00    Status of Unit ISSUED    Transfusion Status OK TO TRANSFUSE    Crossmatch Result      Compatible Performed at [facility], [place] [address] [place], [place] [address]    Unit Number [id number]    Blood Component Type RED CELLS,LR    Unit division 00    Status of Unit ALLOCATED    Transfusion Status OK TO TRANSFUSE    Crossmatch Result Compatible   Prepare RBC     Status: None   Collection Time: 11/28/18  9:56 AM  Result Value Ref Range   Order Confirmation      ORDER PROCESSED BY BLOOD BANK Performed at [facility], [place] [address], [place], [place] [address]   POC occult blood, ED     Status: None   Collection Time: 11/28/18 11:15 AM  Result Value Ref Range   Fecal Occult Bld NEGATIVE NEGATIVE  SARS Coronavirus 2 (CEPHEID - Performed in [facility] hospital lab), Hosp Order     Status: None   Collection Time: 11/28/18 11:32 AM   Specimen: Nasopharyngeal Swab  Result Value Ref Range   SARS Coronavirus 2 NEGATIVE NEGATIVE    Comment: (NOTE) If result is NEGATIVE SARS-CoV-2 target nucleic acids are NOT DETECTED. The SARS-CoV-2 RNA is generally detectable in upper and lower  respiratory specimens during the acute phase of infection. The lowest  concentration of SARS-CoV-2 viral copies this assay can detect is 250  copies / mL. A negative  result does not preclude SARS-CoV-2 infection  and should not be used as the sole basis for treatment or other  patient management decisions.  A negative result may occur with  improper specimen collection / handling, submission of specimen other  than nasopharyngeal swab, presence of viral mutation(s) within the  areas targeted by this assay, and inadequate number of viral copies  (<250 copies / mL). A negative result must be combined with clinical  observations, patient history, and epidemiological information. If result is POSITIVE SARS-CoV-2 target nucleic acids are DETECTED. The SARS-CoV-2 RNA is generally detectable in upper and lower  respiratory specimens dur ing the acute phase of infection.  Positive  results are indicative of active infection with SARS-CoV-2.  Clinical  correlation with patient history and other diagnostic information is  necessary to determine patient infection status.  Positive results do  not rule out bacterial infection or co-infection with other viruses. If result is PRESUMPTIVE POSTIVE SARS-CoV-2 nucleic acids MAY BE PRESENT.   A presumptive positive result was obtained on the submitted specimen  and confirmed on repeat testing.  While 2019 novel coronavirus  (SARS-CoV-2) nucleic acids may be present in the submitted sample  additional confirmatory testing may be necessary for epidemiological  and / or clinical management purposes  to differentiate between  SARS-CoV-2  and other Sarbecovirus currently known to infect humans.  If clinically indicated additional testing with an alternate test  methodology [phone]) is advised. The SARS-CoV-2 RNA is generally  detectable in upper and lower respiratory sp ecimens during the acute  phase of infection. The expected result is Negative. Fact Sheet for Patients:  StrictlyIdeas.no Fact Sheet for Healthcare Providers: BankingDealers.co.za This test is not yet  approved or cleared by the [place] FDA and has been authorized for detection and/or diagnosis of SARS-CoV-2 by FDA under an Emergency Use Authorization (EUA).  This EUA will remain in effect (meaning this test can be used) for the duration of the COVID-19 declaration under Section 564(b)(1) of the Act, 21 U.S.C. section 360bbb-3(b)(1), unless the authorization is terminated or revoked sooner. Performed at [facility], [place] [address], [place], [place] [address]   Ferritin     Status: Abnormal   Collection Time: 11/28/18  1:16 PM  Result Value Ref Range   Ferritin 5 (L) 11 - 307 ng/mL    Comment: Performed at [facility], [place] [address], [place], [place] [address]  Iron and TIBC     Status: Abnormal   Collection Time: 11/28/18  1:16 PM  Result Value Ref Range   Iron 9 (L) 28 - 170 ug/dL   TIBC 498 (H) 250 - 450 ug/dL   Saturation Ratios 2 (L) 10.4 - 31.8 %   UIBC 489 ug/dL    Comment: Performed at [facility], [place] [address] [place], [place] [address]  Reticulocytes     Status: Abnormal   Collection Time: 11/28/18  1:16 PM  Result Value Ref Range   Retic Ct Pct 0.6 0.4 - 3.1 %   RBC. 3.71 (L) 3.87 - 5.11 MIL/uL   Retic Count, Absolute 23.4 19.0 - 186.0 K/uL   Immature Retic Fract 20.1 (H) 2.3 - 15.9 %    Comment: Performed at [facility] [address], [place], [place] [address]   No results found.  Pending Labs Unresulted Labs (From admission, onward)    Start     Ordered   12/05/18 0500  Creatinine, serum  (enoxaparin (LOVENOX)    CrCl >/= 30 ml/min)  Weekly,   R    Comments: while on enoxaparin therapy    11/28/18 1345   11/29/18 0500  CBC  Tomorrow morning,   R     11/28/18 1345   11/28/18 1343  CBC  (enoxaparin (LOVENOX)    CrCl >/= 30 ml/min)  Once,   STAT    Comments: Baseline for enoxaparin therapy IF NOT ALREADY DRAWN.  Notify MD if PLT < 100 K.    11/28/18 1345   11/28/18 1105  Occult blood card to lab, stool  Once,   STAT      11/28/18 1104   11/28/18 1046  Urine culture  ONCE - STAT,   STAT     11/28/18 1045          Vitals/Pain Today's Vitals   11/28/18 0916 11/28/18 1130 11/28/18 1317 11/28/18 1338  BP:  121/72 (!) 122/46 105/69  Pulse:  78 88 66  Resp:  17 18 18   Temp:   98.8 F (37.1 C) 98.9 F (37.2 C)  TempSrc:   Oral Oral  SpO2:  100% 100% 100%  Weight: 45.4 kg     Height: 5\' 4"  (1.626 m)     PainSc: 0-No pain       Isolation Precautions  No active isolations  Medications Medications  0.9 %  sodium chloride infusion (has no administration in time range)  hydrochlorothiazide (HYDRODIURIL) tablet 25 mg (has no administration in time range)  pravastatin (PRAVACHOL) tablet 40 mg (has no administration in time range)  amitriptyline (ELAVIL) tablet 25 mg (has no administration in time range)  pantoprazole (PROTONIX) EC tablet 40 mg (has no administration in time range)  albuterol (PROVENTIL) (2.5 MG/3ML) 0.083% nebulizer solution 2.5 mg (has no administration in time range)  fluticasone (FLOVENT HFA) 110 MCG/ACT inhaler 2 puff (has no administration in time range)  enoxaparin (LOVENOX) injection 40 mg (has no administration in time range)  sodium chloride flush (NS) 0.9 % injection 3 mL (3 mLs Intravenous Not Given 11/28/18 1411)  acetaminophen (TYLENOL) tablet 650 mg (has no administration in time range)    Or  acetaminophen (TYLENOL) suppository 650 mg (has no administration in time range)    Mobility walks Low fall risk   Focused Assessments    R Recommendations: See Admitting Provider Note  Report given to:   Additional Notes:

## 2018-11-29 ENCOUNTER — Encounter (HOSPITAL_COMMUNITY): Payer: Self-pay | Admitting: Internal Medicine

## 2018-11-29 DIAGNOSIS — D509 Iron deficiency anemia, unspecified: Secondary | ICD-10-CM | POA: Diagnosis not present

## 2018-11-29 DIAGNOSIS — Z66 Do not resuscitate: Secondary | ICD-10-CM

## 2018-11-29 DIAGNOSIS — K5520 Angiodysplasia of colon without hemorrhage: Secondary | ICD-10-CM | POA: Diagnosis not present

## 2018-11-29 DIAGNOSIS — Z880 Allergy status to penicillin: Secondary | ICD-10-CM

## 2018-11-29 DIAGNOSIS — Z91018 Allergy to other foods: Secondary | ICD-10-CM

## 2018-11-29 DIAGNOSIS — K219 Gastro-esophageal reflux disease without esophagitis: Secondary | ICD-10-CM | POA: Diagnosis not present

## 2018-11-29 DIAGNOSIS — E785 Hyperlipidemia, unspecified: Secondary | ICD-10-CM | POA: Diagnosis not present

## 2018-11-29 DIAGNOSIS — Z9889 Other specified postprocedural states: Secondary | ICD-10-CM

## 2018-11-29 DIAGNOSIS — Z886 Allergy status to analgesic agent status: Secondary | ICD-10-CM

## 2018-11-29 DIAGNOSIS — F329 Major depressive disorder, single episode, unspecified: Secondary | ICD-10-CM

## 2018-11-29 DIAGNOSIS — Z79899 Other long term (current) drug therapy: Secondary | ICD-10-CM

## 2018-11-29 LAB — TYPE AND SCREEN
ABO/RH(D): O POS
Antibody Screen: NEGATIVE
Unit division: 0
Unit division: 0

## 2018-11-29 LAB — BPAM RBC
Blood Product Expiration Date: 202008052359
Blood Product Expiration Date: 202008052359
ISSUE DATE / TIME: 202007031258
ISSUE DATE / TIME: 202007031631
Unit Type and Rh: 5100
Unit Type and Rh: 5100

## 2018-11-29 LAB — CBC
HCT: 35.2 % — ABNORMAL LOW (ref 36.0–46.0)
Hemoglobin: 10.6 g/dL — ABNORMAL LOW (ref 12.0–15.0)
MCH: 21.4 pg — ABNORMAL LOW (ref 26.0–34.0)
MCHC: 30.1 g/dL (ref 30.0–36.0)
MCV: 71.1 fL — ABNORMAL LOW (ref 80.0–100.0)
Platelets: 397 10*3/uL (ref 150–400)
RBC: 4.95 MIL/uL (ref 3.87–5.11)
RDW: 20.9 % — ABNORMAL HIGH (ref 11.5–15.5)
WBC: 5.8 10*3/uL (ref 4.0–10.5)
nRBC: 0 % (ref 0.0–0.2)

## 2018-11-29 LAB — URINE CULTURE

## 2018-11-29 MED ORDER — SODIUM CHLORIDE 0.9 % IV SOLN
510.0000 mg | Freq: Once | INTRAVENOUS | Status: AC
  Administered 2018-11-29: 510 mg via INTRAVENOUS
  Filled 2018-11-29: qty 17

## 2018-11-29 MED ORDER — FERROUS SULFATE 325 (65 FE) MG PO TABS: 325.0000 mg | ORAL_TABLET | ORAL | 3 refills | Status: DC

## 2018-11-29 NOTE — Discharge Summary (Signed)
Name: Kimberly [name] MRN: [id number] DOB: 1955/07/20 63 y.o. PCP: [name], DO  Date of Admission: 11/28/2018  9:10 AM Date of Discharge: 11/29/2018 Attending Physician: Bartholomew Crews, MD  Discharge Diagnosis: Symptomatic anemia Iron deficiency anemia  Discharge Medications: Allergies as of 11/29/2018      Reactions   Banana    Lip swelling   Grape (artificial) Flavor    Allergic to grapes-swelling   Ibuprofen    REACTION: rash   Penicillins Rash   Did it involve swelling of the face/tongue/throat, SOB, or low BP? No Did it involve sudden or severe rash/hives, skin peeling, or any reaction on the inside of your mouth or nose? No Did you need to seek medical attention at a hospital or doctor's office? No When did it last happen?more than 10 years If all above answers are "NO", may proceed with cephalosporin use.      Medication List    TAKE these medications   albuterol 108 (90 Base) MCG/ACT inhaler Commonly known as: VENTOLIN HFA Inhale 2 puffs into the lungs every 4 (four) hours as needed for wheezing.   amitriptyline 25 MG tablet Commonly known as: ELAVIL TAKE 1 TABLET BY MOUTH AT BEDTIME (PATIENT  NEEDS  A  FOLLOW  UP  APPOINTMENT) What changed: See the new instructions.   ferrous sulfate 325 (65 FE) MG tablet Take 1 tablet (325 mg total) by mouth every other day.   fluticasone 110 MCG/ACT inhaler Commonly known as: FLOVENT HFA Inhale 2 puffs into the lungs 2 (two) times daily.   hydrochlorothiazide 25 MG tablet Commonly known as: HYDRODIURIL Take 1 tablet (25 mg total) by mouth daily.   loratadine 10 MG tablet Commonly known as: Claritin Take 1 tablet (10 mg total) by mouth daily. What changed:   when to take this  reasons to take this   pantoprazole 40 MG tablet Commonly known as: PROTONIX Take 1 tablet (40 mg total) by mouth daily.   pravastatin 40 MG tablet Commonly known as: Pravachol Take 1 tablet (40 mg total) by mouth  every evening.       Disposition and follow-up:   [name] was discharged from [facility] in Stable condition.  At the hospital follow up visit please address:  Symptomatic anemia Iron deficiency anemia - History of similar in 2013, 2/2 Duodenal AVMs > Was on PO Iron (stopped taking x 2 years) - FOBT (-), can do work up for potential bleed vs malabsorption outpatient - Refer for Colonoscopy +/- EGD (due for colonoscopy in 3 yrs anyway) - D/C'd on PPI and PO Iron  2.  Labs / imaging needed at time of follow-up: Consider CBC if symptomatic, will need repeat Iron studies and B12 in about 3 months.  3.  Pending labs/ test needing follow-up: B12  Follow-up Appointments:   Hospital Course by problem list:  Symptomatic anemia Iron deficiency anemia Patient presented with several months of weakness, fatigue, and intermittent light headedness worse for the 2 days PTA. In the ED Hgb noted to be 6.7 but FOBT (-). Iron 9, Ferritin 5, elevated TIBC. Transfused 2U and given dose of IV Iron. Patient felt improved following this treatment. She has history of similar presentation in 2013 during which duodenal AVMs were discovered and treated. She takes PPI, but has not taken Iron for over 2 years. Etiology unclear, possibly slow/intermittent GI bleed vs Malabsorption. Will need follow up Iron studies and B12 in a few months. Will  also need eval for possible source of blood loss. Discharged on PO Iron every other day.  Discharge Vitals:   BP 113/64 (BP Location: Right Arm)   Pulse 62   Temp 97.7 F (36.5 C) (Oral)   Resp 18   Ht 5\' 4"  (1.626 m)   Wt 43.3 kg   LMP 08/21/1982   SpO2 100%   BMI 16.39 kg/m   Pertinent Labs, Studies, and Procedures:  CBC Latest Ref Rng & Units 11/29/2018 11/28/2018 11/28/2018  WBC 4.0 - 10.5 K/uL 5.8 8.0 5.1  Hemoglobin 12.0 - 15.0 g/dL 10.6(L) 10.5(L) 7.0(L)  Hematocrit 36.0 - 46.0 % 35.2(L) 34.4(L) 24.9(L)  Platelets 150 - 400 K/uL 397  394 445(H)   BMP Latest Ref Rng & Units 11/28/2018 01/18/2017 04/25/2016  Glucose 70 - 99 mg/dL 106(H) 78 86  BUN 8 - 23 mg/dL <5(L) 7(L) 11  Creatinine 0.44 - 1.00 mg/dL 0.74 0.75 0.77  BUN/Creat Ratio 12 - 28 - 9(L) 14  Sodium 135 - 145 mmol/L 136 137 138  Potassium 3.5 - 5.1 mmol/L 3.6 4.5 3.7  Chloride 98 - 111 mmol/L 100 98 93(L)  CO2 22 - 32 mmol/L 27 24 28   Calcium 8.9 - 10.3 mg/dL 8.8(L) 9.4 9.3   Iron/TIBC/Ferritin/ %Sat    Component Value Date/Time   IRON 9 (L) 11/28/2018 1316   TIBC 498 (H) 11/28/2018 1316   FERRITIN 5 (L) 11/28/2018 1316   FERRITIN 40 04/25/2016 1600   IRONPCTSAT 2 (L) 11/28/2018 1316   Discharge Instructions: Discharge Instructions    Call MD for:  difficulty breathing, headache or visual disturbances   Complete by: As directed    Call MD for:  extreme fatigue   Complete by: As directed    Call MD for:  persistant dizziness or light-headedness   Complete by: As directed    Diet - low sodium heart healthy   Complete by: As directed    Discharge instructions   Complete by: As directed    Thank you for allowing [place] to care for you  Your symptoms were due to a low blood count caused by Iron deficiency - The source of your iron deficiency is unclear at this time: it may be from a very slow bleed or possibly problems with absorbing iron - We have given you blood and IV iron in the hospital - START taken Ferrous Sulfate (Iron), Once, Every other day  Please follow up in clinic in the next 6 days, you will be contacted with an appointment - Please call [phone] you have not been contacted by Tuesday - The clinic doctor will discuss further tests at follow up at that visit   Increase activity slowly   Complete by: As directed       Signed: Neva Seat, MD 11/29/2018, 1:57 PM   Pager: [phone]

## 2018-11-29 NOTE — Progress Notes (Signed)
[name] to be discharged Home per MD order. Discussed prescriptions and follow up appointments with the patient. Prescriptions given to patient; medication list explained in detail. Patient verbalized understanding.  Skin clean, dry and intact without evidence of skin break down, no evidence of skin tears noted. IV catheter discontinued intact. Site without signs and symptoms of complications. Dressing and pressure applied. Pt denies pain at the site currently. No complaints noted.  Patient free of lines, drains, and wounds.   An After Visit Summary (AVS) was printed and given to the patient. Patient escorted via wheelchair, and discharged home via private auto.  Shela Commons, RN

## 2018-11-29 NOTE — Progress Notes (Signed)
   Subjective: Patient seen at bedside and states she is feeling better today, back to her prior strength. We discussed her iron deficiency and that we wold be giving her iron in addition to the blood she already received. We spoke about her previous anemia in 2013 being due to duodenal AVMs and that we would need to pursue further workup for her anemia in the out patient setting to determine if this was due to slow / intermittent GI bleeding vs possible mal absorption.   She was informed that we will be prescribing her iron (which she states she tolerated well previously) and will have her follow up in the clinic in the next week and have labs checked again in 3 months to evaluate her response to therapy and check B12 levels. She was also informed she is due for a colonoscopy.  She ha snot complaints and agrees with our plan.  Objective:  Vital signs in last 24 hours: Vitals:   11/28/18 1652 11/28/18 2001 11/28/18 2050 11/29/18 0544  BP: 120/74 111/62  113/69  Pulse: 65 65  66  Resp: 15 16    Temp: 98.5 F (36.9 C) 98.1 F (36.7 C)  98.7 F (37.1 C)  TempSrc: Oral Oral  Oral  SpO2: 100% 100% 100% 100%  Weight:      Height:       Physical Exam Constitutional:      General: She is not in acute distress.    Appearance: Normal appearance.  Cardiovascular:     Rate and Rhythm: Normal rate and regular rhythm.     Pulses: Normal pulses.     Heart sounds: Normal heart sounds.  Pulmonary:     Effort: Pulmonary effort is normal. No respiratory distress.     Breath sounds: Normal breath sounds.  Abdominal:     General: Bowel sounds are normal. There is no distension.     Palpations: Abdomen is soft.     Tenderness: There is no abdominal tenderness.  Musculoskeletal:        General: No swelling or deformity.  Skin:    General: Skin is warm and dry.  Neurological:     General: No focal deficit present.     Mental Status: Mental status is at baseline.    Assessment/Plan:  Active  Problems:   Symptomatic anemia  Iron Defecieny Symptomatic Microcytic Anemia Hgb 6.7, MCV 66.9 in ED. Fecal occult negative. Has been off Iron for ~2 years and has not had Hgb checked in >2.5 years. Has history of Fe deficiency anemia in 2013 2/2 duodenal AVMs. S/P 2 U PRBCs. Hgb Stable O/N at 10.6. Will replete with dose of IV Iron prior to discharge and have patient follow up in our clinic. > Fe 9, Ferritin 5, Retic normal (hypoproliferative) - IV Iron - B12 - PPI  GERD - Protonix  Depression: Amitriptyline HLD: Pravastatin   Diet: Regular DVT PPx : Lovenox Code: DNR  Dispo: Anticipated discharge in approximately Today.   Neva Seat, MD 11/29/2018, 6:42 AM

## 2018-12-02 ENCOUNTER — Other Ambulatory Visit: Payer: Self-pay

## 2018-12-02 ENCOUNTER — Ambulatory Visit (INDEPENDENT_AMBULATORY_CARE_PROVIDER_SITE_OTHER): Payer: Medicaid Other | Admitting: Internal Medicine

## 2018-12-02 VITALS — BP 122/74 | HR 83 | Temp 97.7°F | Ht 64.0 in | Wt 96.5 lb

## 2018-12-02 DIAGNOSIS — I10 Essential (primary) hypertension: Secondary | ICD-10-CM | POA: Diagnosis not present

## 2018-12-02 DIAGNOSIS — K31819 Angiodysplasia of stomach and duodenum without bleeding: Secondary | ICD-10-CM

## 2018-12-02 DIAGNOSIS — D509 Iron deficiency anemia, unspecified: Secondary | ICD-10-CM

## 2018-12-02 DIAGNOSIS — E876 Hypokalemia: Secondary | ICD-10-CM | POA: Diagnosis not present

## 2018-12-02 DIAGNOSIS — K219 Gastro-esophageal reflux disease without esophagitis: Secondary | ICD-10-CM

## 2018-12-02 DIAGNOSIS — Z9889 Other specified postprocedural states: Secondary | ICD-10-CM

## 2018-12-02 DIAGNOSIS — Z79899 Other long term (current) drug therapy: Secondary | ICD-10-CM | POA: Diagnosis not present

## 2018-12-02 DIAGNOSIS — D649 Anemia, unspecified: Secondary | ICD-10-CM | POA: Diagnosis not present

## 2018-12-02 MED ORDER — PANTOPRAZOLE SODIUM 40 MG PO TBEC: 40.0000 mg | DELAYED_RELEASE_TABLET | Freq: Every day | ORAL | 3 refills | Status: DC

## 2018-12-02 MED ORDER — HYDROCHLOROTHIAZIDE 25 MG PO TABS: 25.0000 mg | ORAL_TABLET | Freq: Every day | ORAL | 3 refills | Status: DC

## 2018-12-02 MED ORDER — POTASSIUM CHLORIDE CRYS ER 20 MEQ PO TBCR: 20.0000 meq | EXTENDED_RELEASE_TABLET | Freq: Every day | ORAL | 0 refills | Status: DC

## 2018-12-02 NOTE — Assessment & Plan Note (Signed)
BP Readings from Last 3 Encounters:  12/02/18 122/74  11/29/18 113/64  01/15/18 132/83   Blood pressure well controlled with HCTZ 25mg  qd. She is moving and accidentally packed this away and needs a refill. She is also taking 92mEq K although I do not see this on her medication list since 2013. She did have a K of 3.6 during recent hospital admission so will check BMP today and order K-Dur 20 mEq.   - K-Dur 20 mEq - refill HCTZ - BMP today  - will f/u August 18th in [place] with me.

## 2018-12-02 NOTE — Progress Notes (Signed)
CC: hospital follow-up for anemia   HPI:  Kimberly [name] is a 63 y.o. with PMH as below, including AVMs causing symptomatic anemia, iron deficiency anemia, and history of alcohol use (stopped 2004) presenting for hospital follow-up after recent admission for acute symptomatic microcytic anemia. She has a history of microcytic anemia and was previously on po iron although this was stopped at some point. She was transfused 2U during admission and is feeling much better today. She denies nausea, vomiting, abdominal pain, weakness, numbness or tingling. She has not noticed any change in her stools prior to admission or afterward, neither BRB or dark stools. She is taking her iron every other day. Her last EGD and colonoscopy were in 2013 and she does not think she has seen a GI doctor since that time when she also had symptmoatic anemia and was found to have AVMs on the EGD. She was also discharged with continuation of her protonix 40 mg qd.  She does need refills on her HCTZ and protonix as she is moving and accidentally packed these medicines up. She states she was also taking 3mEq of K although this does not appear to have been prescribed.   Please see A&P for assessment of the patient's acute and chronic medical conditions.   Past Medical History:  Diagnosis Date  . Alcohol abuse, in remission    since around 2005  . Alcoholic hepatitis    fatty liver on imaging  . Allergic rhinitis   . Anemia    EGD with duodenal AVM, normal colonoscopy 04/2012  . Asthma   . Callus of foot 2009  . Chest pain, musculoskeletal 2011  . Cholelithiasis    [place] 09/2008: Cholelithiasis is present, s/p cholecystectomy  . Depression   . Excessive cerumen in ear canal    since before 2000  . GERD (gastroesophageal reflux disease)   . Hyperlipidemia   . Hypertension   . Menopausal syndrome    Treated with estradiol in the past - discontinued 04/2012  . MENOPAUSAL SYNDROME 09/24/2006   2008 Note : The pt is  adament about having this medication.  She fully understands the increased risk of heart disease and cancer that is associated with HRT and is willing to accept this risk in order to prevent the debilitating hot flashes she has off this medication.  Will continue to encourage her to try to wean herself off of this medication at our next visit.  2009 note indicated that she tr  . Otitis externa, acute Feb 2012   bilateral, treated with azithromycin after failure of neomycin drops  . Otitis media, acute Feb 2012  . PONV (postoperative nausea and vomiting)    Review of Systems:   Review of Systems  Constitutional: Negative for fever, malaise/fatigue and weight loss.  Respiratory: Negative for shortness of breath and wheezing.   Cardiovascular: Negative for chest pain, palpitations and leg swelling.  Gastrointestinal: Negative for abdominal pain, blood in stool, constipation, diarrhea, melena, nausea and vomiting.  Neurological: Negative for dizziness, tingling and sensory change.  Endo/Heme/Allergies: Does not bruise/bleed easily.   Physical Exam:  Constitution: NAD, appears stated age  Cardio: RRR, no m/r/g, no LE edema  Respiratory: CTA, no wheezing rales, rhonchi  Abdominal: +BS, NTTP, non-distended Neuro: alert and oriented, pleasant Skin: c/d/i    Vitals:   12/02/18 0844  BP: 122/74  Pulse: 83  Temp: 97.7 F (36.5 C)  TempSrc: Oral  SpO2: 100%  Weight: 96 lb 8 oz (43.8 kg)  Height: 5\' 4"  (1.626 m)     Assessment & Plan:   See Encounters Tab for problem based charting.  Patient discussed with Dr. Angelia Mould

## 2018-12-02 NOTE — Patient Instructions (Signed)
Thank you for allowing [place] to provide your care today. Today we discussed your recent anemia and low potassium.   I have ordered the following labs for you:  CBC and basic metabolic panel    I will call if any are abnormal.    Today we made the following changes to your medications:   Please START taking  Potassium chloride 5mEq by mouth one time per day.   I have sent a referral to the gastroenterologist for further work-up of your anemia and for likely colonoscopy and EGD.   Please follow-up August 19th for our yearly appointment.    Should you have any questions or concerns please call the internal medicine clinic at [phone].

## 2018-12-02 NOTE — Assessment & Plan Note (Signed)
She is here for hospital follow-up after recent admission for acute symptomatic microcytic anemia. She has a history of microcytic anemia and was previously on po iron although this was stopped at some point. She was transfused 2U during admission and is feeling much better today. She denies nausea, vomiting, abdominal pain, weakness, numbness or tingling. She has not noticed any change in her stools prior to admission or afterward, neither BRB or dark stools. She is taking her iron every other day. Her last EGD and colonoscopy were in 2013 and she does not think she has seen a GI doctor since that time when she also had symptmoatic anemia and was found to have AVMs on the EGD. She was also discharged with continuation of her protonix 40 mg qd, which she needs refilled as she accidentally packed this medication while moving.   - refill protonix 40 mEq qd  - referral to GI placed for colonoscopy and EGD - CBC - iron studies and B12 at f/u August 18th

## 2018-12-03 ENCOUNTER — Encounter: Payer: Medicaid Other | Admitting: Internal Medicine

## 2018-12-03 LAB — BMP8+ANION GAP
Anion Gap: 17 mmol/L (ref 10.0–18.0)
BUN/Creatinine Ratio: 11 — ABNORMAL LOW (ref 12–28)
BUN: 8 mg/dL (ref 8–27)
CO2: 25 mmol/L (ref 20–29)
Calcium: 9.9 mg/dL (ref 8.7–10.3)
Chloride: 98 mmol/L (ref 96–106)
Creatinine, Ser: 0.75 mg/dL (ref 0.57–1.00)
GFR calc Af Amer: 97 mL/min/{1.73_m2} (ref >59)
GFR calc non Af Amer: 85 mL/min/{1.73_m2} (ref >59)
Glucose: 84 mg/dL (ref 65–99)
Potassium: 4.5 mmol/L (ref 3.5–5.2)
Sodium: 140 mmol/L (ref 134–144)

## 2018-12-03 LAB — CBC
Hematocrit: 40.6 % (ref 34.0–46.6)
Hemoglobin: 11.7 g/dL (ref 11.1–15.9)
MCH: 21.7 pg — ABNORMAL LOW (ref 26.6–33.0)
MCHC: 28.8 g/dL — ABNORMAL LOW (ref 31.5–35.7)
MCV: 75 fL — ABNORMAL LOW (ref 79–97)
Platelets: 452 10*3/uL — ABNORMAL HIGH (ref 150–450)
RBC: 5.4 x10E6/uL — ABNORMAL HIGH (ref 3.77–5.28)
RDW: 21.2 % — ABNORMAL HIGH (ref 11.7–15.4)
WBC: 5.3 10*3/uL (ref 3.4–10.8)

## 2018-12-05 ENCOUNTER — Other Ambulatory Visit: Payer: Self-pay | Admitting: Internal Medicine

## 2018-12-05 DIAGNOSIS — F32A Depression, unspecified: Secondary | ICD-10-CM

## 2018-12-05 DIAGNOSIS — F329 Major depressive disorder, single episode, unspecified: Secondary | ICD-10-CM

## 2018-12-09 NOTE — Progress Notes (Signed)
Internal Medicine Clinic Attending  Case discussed with Dr. Seawell at the time of the visit.  We reviewed the resident's history and exam and pertinent patient test results.  I agree with the assessment, diagnosis, and plan of care documented in the resident's note.    

## 2018-12-15 ENCOUNTER — Telehealth: Payer: Self-pay | Admitting: Internal Medicine

## 2018-12-15 ENCOUNTER — Other Ambulatory Visit: Payer: Self-pay

## 2018-12-15 ENCOUNTER — Ambulatory Visit (INDEPENDENT_AMBULATORY_CARE_PROVIDER_SITE_OTHER): Payer: Medicaid Other | Admitting: Internal Medicine

## 2018-12-15 DIAGNOSIS — D649 Anemia, unspecified: Secondary | ICD-10-CM

## 2018-12-15 DIAGNOSIS — R140 Abdominal distension (gaseous): Secondary | ICD-10-CM | POA: Diagnosis not present

## 2018-12-15 DIAGNOSIS — Z9049 Acquired absence of other specified parts of digestive tract: Secondary | ICD-10-CM

## 2018-12-15 DIAGNOSIS — R141 Gas pain: Secondary | ICD-10-CM

## 2018-12-15 DIAGNOSIS — G8929 Other chronic pain: Secondary | ICD-10-CM

## 2018-12-15 DIAGNOSIS — K5900 Constipation, unspecified: Secondary | ICD-10-CM | POA: Diagnosis not present

## 2018-12-15 DIAGNOSIS — Z90710 Acquired absence of both cervix and uterus: Secondary | ICD-10-CM

## 2018-12-15 DIAGNOSIS — R1011 Right upper quadrant pain: Secondary | ICD-10-CM

## 2018-12-15 DIAGNOSIS — D509 Iron deficiency anemia, unspecified: Secondary | ICD-10-CM | POA: Diagnosis not present

## 2018-12-15 DIAGNOSIS — Z9889 Other specified postprocedural states: Secondary | ICD-10-CM

## 2018-12-15 MED ORDER — POLYSACCHARIDE IRON COMPLEX 150 MG PO CAPS: 150.0000 mg | ORAL_CAPSULE | Freq: Every day | ORAL | 5 refills | Status: DC

## 2018-12-15 NOTE — Telephone Encounter (Signed)
Thank you, I will call her this afternoon.

## 2018-12-15 NOTE — Telephone Encounter (Signed)
Pt would like a nurse to callback [phone]

## 2018-12-15 NOTE — Telephone Encounter (Signed)
Return call to pt - stated she has "a lot of gas"; stated she had her gallbladder removed. And she is not taking potassium nor pravastatin b/c they cause acid reflux "too much acid on my stomach". Offered /explained telehealth visit; she's agreeable - telehealth appt scheduled for today @ 1215 PM.

## 2018-12-15 NOTE — Progress Notes (Signed)
CC: increased gas  This is a telephone encounter between [employer] and Kimberly [name] on 12/15/2018 for increased gas. The visit was conducted with the patient located at home and [name] at [facility]. The patient's identity was confirmed using their DOB and current address. The patient has consented to being evaluated through a telephone encounter and understands the associated risks (an examination cannot be done and the patient may need to come in for an appointment) / benefits (allows the patient to remain at home, decreasing exposure to coronavirus). I personally spent 22 minutes on medical discussion.   HPI:  [name] is a 63 y.o. with PMH as below in addition to cholecystectomy, hysterectomy with second laparoscopy for lysis of adhesions in the 80s, recent hospitalization for iron deficiency anemia requiring iron transfusion and 2U transfusion RBCs and started on iron supplementation at discharged. Since shortly after discharge two weeks ago, she has been having a lot of gas, gurgling in her mid abdomen, and indegestion. Whenever she does have a bowel movement she has the sensation that she still has to go. She has been taking mylanta in addition to her normal protonix, and it seems to help a little. She also has some pain in her RUQ, but this has been present since her cholecystectomy since 2010.  She has been taking iron every other day since discharge. She does endorse consiptation. She also has had some dark stools since starting iron but no BRB. No nausea or vomiting, dizziness or weakness. Eating makes her feel bloated. She boils her food and tries not to eat fried food or a lot of salt. She does not drink fruit juice, alcohol or much water but does drink a lot of soda. She does not smoke.   Please see A&P for assessment of the patient's acute and chronic medical conditions.    Past Medical History:  Diagnosis Date  . Alcohol abuse, in remission    since around 2005   . Alcoholic hepatitis    fatty liver on imaging  . Allergic rhinitis   . Anemia    EGD with duodenal AVM, normal colonoscopy 04/2012  . Asthma   . Callus of foot 2009  . Chest pain, musculoskeletal 2011  . Cholelithiasis    [place] 09/2008: Cholelithiasis is present, s/p cholecystectomy  . Depression   . Excessive cerumen in ear canal    since before 2000  . GERD (gastroesophageal reflux disease)   . Hyperlipidemia   . Hypertension   . Menopausal syndrome    Treated with estradiol in the past - discontinued 04/2012  . MENOPAUSAL SYNDROME 09/24/2006   2008 Note : The pt is adament about having this medication.  She fully understands the increased risk of heart disease and cancer that is associated with HRT and is willing to accept this risk in order to prevent the debilitating hot flashes she has off this medication.  Will continue to encourage her to try to wean herself off of this medication at our next visit.  2009 note indicated that she tr  . Otitis externa, acute Feb 2012   bilateral, treated with azithromycin after failure of neomycin drops  . Otitis media, acute Feb 2012  . PONV (postoperative nausea and vomiting)    Review of Systems:   Review of Systems  Constitutional: Negative for chills, fever and malaise/fatigue.  Cardiovascular: Negative for chest pain and leg swelling.  Gastrointestinal: Positive for abdominal pain and constipation. Negative for blood  in stool, diarrhea, heartburn, melena (dark stool since starting iron supplementation), nausea and vomiting.  Neurological: Negative for dizziness, tingling, sensory change, focal weakness, weakness and headaches.    Assessment & Plan:   See Encounters Tab for problem based charting.  Patient discussed with Dr. Evette Doffing

## 2018-12-16 ENCOUNTER — Encounter: Payer: Self-pay | Admitting: Internal Medicine

## 2018-12-16 DIAGNOSIS — R141 Gas pain: Secondary | ICD-10-CM | POA: Insufficient documentation

## 2018-12-16 NOTE — Assessment & Plan Note (Addendum)
Gastrointestinal irritation secondary to ferrous sulfate.   - switch to nu-iron  - if symptoms continue, may need iron infusion  - f/u with GI August 13th  - f/u clinic August 19th - CBC, repeat iron studies if not done by GI

## 2018-12-16 NOTE — Assessment & Plan Note (Addendum)
Since shortly after discharge two weeks ago, she has been having a lot of gas, gurgling in her mid abdomen, and indegestion. Whenever she does have a bowel movement she has the sensation that she still has to go. She has been taking mylanta in addition to her normal protonix, and it seems to help a little. She also has some pain in her RUQ, but this has been present since her cholecystectomy since 2010.  She has been taking iron every other day since discharge. She does endorse consiptation. She also has had some dark stools since starting iron but no BRB. No nausea or vomiting, dizziness or weakness. Eating makes her feel bloated. She boils her food and tries not to eat fried food or a lot of salt. She does not drink fruit juice, alcohol or much water but does drink a lot of soda. She does not smoke.  Her symptoms are likely secondary to her iron supplement as her symptoms are consistent with onset. I do not think she has an acute bleed currently. She has follow-up with GI August 13th for EGD/colonoscopy.   - stop sodas   - stop ferrous sulfate - start nu-iron 150mg  tablet qd

## 2018-12-17 NOTE — Progress Notes (Signed)
Internal Medicine Clinic Attending  Case discussed with Dr. Seawell at the time of the visit.  We reviewed the resident's history and exam and pertinent patient test results.  I agree with the assessment, diagnosis, and plan of care documented in the resident's note.    

## 2018-12-17 NOTE — Addendum Note (Signed)
Addended by: Lalla Brothers T on: 12/17/2018 08:13 AM   Modules accepted: Level of Service

## 2018-12-31 ENCOUNTER — Encounter: Payer: Medicaid Other | Admitting: Internal Medicine

## 2018-12-31 ENCOUNTER — Telehealth: Payer: Self-pay | Admitting: *Deleted

## 2018-12-31 NOTE — Telephone Encounter (Signed)
Pt calls and states she wants to know if her dr wants her to get labwork done before her 9/30 appt? She states she feels good and is not drinking.

## 2019-01-01 NOTE — Telephone Encounter (Signed)
She does not need lab work prior to her appointment, but we may do labs during the appointment.

## 2019-01-02 NOTE — Telephone Encounter (Signed)
Pt informed.Kimberly Hidden Cassady8/7/202011:47 AM

## 2019-01-06 ENCOUNTER — Other Ambulatory Visit: Payer: Self-pay | Admitting: Internal Medicine

## 2019-01-06 DIAGNOSIS — F32A Depression, unspecified: Secondary | ICD-10-CM

## 2019-01-06 DIAGNOSIS — F329 Major depressive disorder, single episode, unspecified: Secondary | ICD-10-CM

## 2019-01-06 MED ORDER — AMITRIPTYLINE HCL 25 MG PO TABS: 25.0000 mg | ORAL_TABLET | Freq: Every day | ORAL | 1 refills | Status: DC

## 2019-01-06 NOTE — Telephone Encounter (Signed)
Refill Request  amitriptyline (ELAVIL) 25 MG tablet   [facility] - [place] ([place]), [place] - [address]

## 2019-01-08 ENCOUNTER — Ambulatory Visit: Payer: Medicaid Other | Admitting: Gastroenterology

## 2019-01-13 ENCOUNTER — Other Ambulatory Visit: Payer: Self-pay | Admitting: Internal Medicine

## 2019-01-14 ENCOUNTER — Encounter: Payer: Medicaid Other | Admitting: Internal Medicine

## 2019-01-16 ENCOUNTER — Encounter: Payer: Self-pay | Admitting: Internal Medicine

## 2019-01-16 ENCOUNTER — Ambulatory Visit (INDEPENDENT_AMBULATORY_CARE_PROVIDER_SITE_OTHER): Payer: Medicaid Other | Admitting: Internal Medicine

## 2019-01-16 ENCOUNTER — Other Ambulatory Visit (HOSPITAL_COMMUNITY)
Admission: RE | Admit: 2019-01-16 | Discharge: 2019-01-16 | Disposition: A | Discharge status: Home / Self Care | Payer: Medicaid Other | Source: Ambulatory Visit | Attending: Internal Medicine | Admitting: Internal Medicine

## 2019-01-16 ENCOUNTER — Other Ambulatory Visit: Payer: Self-pay

## 2019-01-16 VITALS — BP 146/79 | HR 70 | Temp 97.4°F | Ht 64.0 in | Wt 100.0 lb

## 2019-01-16 DIAGNOSIS — N898 Other specified noninflammatory disorders of vagina: Secondary | ICD-10-CM | POA: Diagnosis present

## 2019-01-16 NOTE — Progress Notes (Signed)
   CC: vaginal discharge   HPI:  Kimberly [name] is a 63 y.o. female with PMHx listed below who presents for evaluation of vaginal discharge she has had for the last 2-3 months. Please see problem based charting for further details.   Past Medical History:  Diagnosis Date  . Alcohol abuse, in remission    since around 2005  . Alcoholic hepatitis    fatty liver on imaging  . Allergic rhinitis   . Anemia    EGD with duodenal AVM, normal colonoscopy 04/2012  . Asthma   . Callus of foot 2009  . Chest pain, musculoskeletal 2011  . Cholelithiasis    [place] 09/2008: Cholelithiasis is present, s/p cholecystectomy  . Depression   . Excessive cerumen in ear canal    since before 2000  . GERD (gastroesophageal reflux disease)   . Hyperlipidemia   . Hypertension   . Menopausal syndrome    Treated with estradiol in the past - discontinued 04/2012  . MENOPAUSAL SYNDROME 09/24/2006   2008 Note : The pt is adament about having this medication.  She fully understands the increased risk of heart disease and cancer that is associated with HRT and is willing to accept this risk in order to prevent the debilitating hot flashes she has off this medication.  Will continue to encourage her to try to wean herself off of this medication at our next visit.  2009 note indicated that she tr  . Otitis externa, acute Feb 2012   bilateral, treated with azithromycin after failure of neomycin drops  . Otitis media, acute Feb 2012  . PONV (postoperative nausea and vomiting)    Review of Systems:  Constitutional: negative for fevers, chills GI: negative for abdominal pain, n/v GU: positive for intermittent dysuria. Negative for hematuria   Physical Exam:  Vitals:   01/16/19 0925  BP: (!) 146/79  Pulse: 70  Temp: (!) 97.4 F (36.3 C)  TempSrc: Oral  SpO2: 100%  Weight: 100 lb (45.4 kg)  Height: 5\' 4"  (1.626 m)   Physical Exam Exam conducted with a chaperone present.  Constitutional:      General:  She is not in acute distress.    Appearance: Normal appearance.  Cardiovascular:     Rate and Rhythm: Normal rate and regular rhythm.  Pulmonary:     Effort: Pulmonary effort is normal.     Breath sounds: Normal breath sounds.  Abdominal:     General: There is no distension.     Palpations: Abdomen is soft.     Tenderness: There is no abdominal tenderness.  Genitourinary:    Labia:        Right: No rash or tenderness.        Left: No rash or tenderness.      Urethra: No urethral swelling.     Vagina: Vaginal discharge present. No tenderness or bleeding.     Cervix: Normal.     Comments: Moderate amount of grey discharge.  Neurological:     Mental Status: She is alert.     Assessment & Plan:   See Encounters Tab for problem based charting.  Patient discussed with Dr. Dareen Piano

## 2019-01-16 NOTE — Patient Instructions (Signed)
Kimberly [name], It was nice meeting you! Today we discussed your vaginal discharge. I have sent a sample for testing and will let you know when I have the results and how to properly treat it. The test usually takes about 48 hours to return.   You can plan to follow-up with Dr. Sharon Seller as scheduled.   Take care! Dr. Koleen Distance

## 2019-01-18 ENCOUNTER — Encounter: Payer: Self-pay | Admitting: Internal Medicine

## 2019-01-18 DIAGNOSIS — N898 Other specified noninflammatory disorders of vagina: Secondary | ICD-10-CM | POA: Insufficient documentation

## 2019-01-18 NOTE — Assessment & Plan Note (Signed)
Patient presents for vaginal discharge for the last 2-3 months that is brown-gray in color with associated pruritis and occasional dysuria. No fevers, chills, n/v, pelvic pain, vaginal bleeding, rash or skin changes. She has not been sexually active in over 7 years. Physical exam unremarkable other than moderate amount of vaginal discharge which was sent for BV, candidiasis testing. Will follow-up results and treat appropriately.

## 2019-01-19 NOTE — Progress Notes (Signed)
Internal Medicine Clinic Attending  Case discussed with Dr. Bloomfield at the time of the visit.  We reviewed the resident's history and exam and pertinent patient test results.  I agree with the assessment, diagnosis, and plan of care documented in the resident's note.  

## 2019-01-20 ENCOUNTER — Other Ambulatory Visit: Payer: Self-pay | Admitting: Internal Medicine

## 2019-01-20 ENCOUNTER — Telehealth: Payer: Self-pay | Admitting: Internal Medicine

## 2019-01-20 DIAGNOSIS — J4520 Mild intermittent asthma, uncomplicated: Secondary | ICD-10-CM

## 2019-01-20 LAB — CERVICOVAGINAL ANCILLARY ONLY
Bacterial vaginitis: NEGATIVE
Candida vaginitis: NEGATIVE
Chlamydia: NEGATIVE
Neisseria Gonorrhea: NEGATIVE
Trichomonas: POSITIVE — AB

## 2019-01-20 MED ORDER — METRONIDAZOLE 500 MG PO TABS: 500.0000 mg | ORAL_TABLET | Freq: Two times a day (BID) | ORAL | 0 refills | Status: AC

## 2019-01-20 NOTE — Progress Notes (Signed)
Called patient regarding test results. CV swab positive for Trichomonas. 1 week course of metronidazole sent to her pharmacy. She has not been sexually active for >7 years.

## 2019-01-20 NOTE — Telephone Encounter (Signed)
Pt is calling regarding lab results [phone]

## 2019-01-29 ENCOUNTER — Other Ambulatory Visit (INDEPENDENT_AMBULATORY_CARE_PROVIDER_SITE_OTHER): Payer: Medicaid Other

## 2019-01-29 ENCOUNTER — Other Ambulatory Visit: Payer: Self-pay

## 2019-01-29 ENCOUNTER — Encounter: Payer: Self-pay | Admitting: Gastroenterology

## 2019-01-29 ENCOUNTER — Ambulatory Visit (INDEPENDENT_AMBULATORY_CARE_PROVIDER_SITE_OTHER): Payer: Medicaid Other | Admitting: Gastroenterology

## 2019-01-29 VITALS — BP 108/64 | HR 94 | Temp 97.6°F | Ht 64.0 in | Wt 98.5 lb

## 2019-01-29 DIAGNOSIS — K219 Gastro-esophageal reflux disease without esophagitis: Secondary | ICD-10-CM | POA: Diagnosis not present

## 2019-01-29 DIAGNOSIS — K5520 Angiodysplasia of colon without hemorrhage: Secondary | ICD-10-CM | POA: Diagnosis not present

## 2019-01-29 DIAGNOSIS — D500 Iron deficiency anemia secondary to blood loss (chronic): Secondary | ICD-10-CM

## 2019-01-29 DIAGNOSIS — D509 Iron deficiency anemia, unspecified: Secondary | ICD-10-CM

## 2019-01-29 LAB — PROTIME-INR
INR: 1 ratio (ref 0.8–1.0)
Prothrombin Time: 11.9 s (ref 9.6–13.1)

## 2019-01-29 LAB — BASIC METABOLIC PANEL
BUN: 11 mg/dL (ref 6–23)
CO2: 31 mEq/L (ref 19–32)
Calcium: 10.1 mg/dL (ref 8.4–10.5)
Chloride: 99 mEq/L (ref 96–112)
Creatinine, Ser: 0.84 mg/dL (ref 0.40–1.20)
GFR: 82.4 mL/min (ref >60.00)
Glucose, Bld: 95 mg/dL (ref 70–99)
Potassium: 4 mEq/L (ref 3.5–5.1)
Sodium: 140 mEq/L (ref 135–145)

## 2019-01-29 LAB — CBC
HCT: 40.4 % (ref 36.0–46.0)
Hemoglobin: 13 g/dL (ref 12.0–15.0)
MCHC: 32.2 g/dL (ref 30.0–36.0)
MCV: 83.7 fl (ref 78.0–100.0)
Platelets: 356 10*3/uL (ref 150.0–400.0)
RBC: 4.82 Mil/uL (ref 3.87–5.11)
RDW: 30.1 % — ABNORMAL HIGH (ref 11.5–15.5)
WBC: 5.2 10*3/uL (ref 4.0–10.5)

## 2019-01-29 LAB — IBC + FERRITIN
Ferritin: 61.5 ng/mL (ref 10.0–291.0)
Iron: 93 ug/dL (ref 42–145)
Saturation Ratios: 25.5 % (ref 20.0–50.0)
Transferrin: 260 mg/dL (ref 212.0–360.0)

## 2019-01-29 LAB — VITAMIN B12: Vitamin B-12: 331 pg/mL (ref 211–911)

## 2019-01-29 LAB — IGA: IgA: 337 mg/dL (ref 68–378)

## 2019-01-29 MED ORDER — PEG 3350-KCL-NA BICARB-NACL 420 G PO SOLR: 4000.0000 mL | Freq: Once | ORAL | 0 refills | Status: AC

## 2019-01-29 NOTE — Progress Notes (Signed)
[facility] VISIT   Primary Care Provider Seawell, Jaimie A, DO [address] [place] [address] [phone]  Referring Provider Lucious Groves, DO [address] [place],  [name] [address] [phone]  Patient Profile: Kimberly [name] is a 63 y.o. female with a pmh significant for prior alcohol abuse (in cessation since 2000 08/2003), asthma, status post cholecystectomy, GERD, hypertension, hyperlipidemia, small bowel AVMs, iron deficiency anemia.  The patient presents to the [facility] for an evaluation and management of problem(s) noted below:  Problem List 1. Iron deficiency anemia due to chronic blood loss   2. AVM (arteriovenous malformation) of small bowel, acquired   3. Microcytic anemia   4. Gastroesophageal reflux disease, esophagitis presence not specified     History of Present Illness This is the patient's first visit to the outpatient of our GI clinic in years.  She is referred for evaluation of iron deficiency microcytic anemia.  She was admitted in July with iron deficiency and anemia requiring transfusions as well as IV iron.  She was initiated on oral iron.  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  She had been on oral iron but stopped it over the course of last 2 years.  When the patient left the hospital her hemoglobin was 10.6 and upon follow-up with PCP it was 11.7.  Iron studies have not been repeated since.  Patient did not tolerate 325 ferrous sulfate but was transitioned to 150 mg NuIron and she has been tolerating that.  She has a longstanding history of GERD and is on Protonix daily.  She denies any dysphagia or odynophagia.  She occasionally will have bloating and increased flatulence but that is improved now that she is gone to the lower dose of iron.  The patient has not taken NuIron twice daily before.  She denies any current hematochezia but she does have some dark stools as  result of being on the iron.  She does not recall having significant black stools or melena prior to her hospitalization in July.  Patient has no abdominal pain.  She denies any nausea or vomiting.  She has bowel movements on a daily or every other day basis which is longstanding for her.  Patient does not take significant nonsteroidals.  Her pica symptoms are improved.  GI Review of Systems Positive as above Negative for nocturnal cough, vomiting, hematemesis, coffee-ground emesis, early satiety, change in bowel habits  Review of Systems General: Denies fevers/chills/weight loss HEENT: Denies oral lesions Cardiovascular: Denies chest pain/palpitations Pulmonary: Denies shortness of breath/cough Gastroenterological: See HPI Genitourinary: Denies darkened urine or hematuria Hematological: Denies easy bruising/bleeding Endocrine: Denies temperature intolerance Dermatological: Denies jaundice Psychological: Patient is anxious about any other procedures and especially about having to repeat a possible COVID-19 test   Medications Current Outpatient Medications  Medication Sig Dispense Refill   amitriptyline (ELAVIL) 25 MG tablet Take 1 tablet (25 mg total) by mouth at bedtime. 90 tablet 1   ferrous sulfate 325 (65 FE) MG tablet Take 1 tablet (325 mg total) by mouth every other day. (Patient taking differently: Take 150 mg by mouth every other day. ) 45 tablet 3   FLOVENT HFA 110 MCG/ACT inhaler Inhale 2 puffs by mouth twice daily 36 g 0   hydrochlorothiazide (HYDRODIURIL) 25 MG tablet Take 1 tablet (25 mg total) by mouth daily. 90 tablet 3   iron polysaccharides (NU-IRON) 150 MG capsule Take 1 capsule (150 mg  total) by mouth daily. 30 capsule 5   pantoprazole (PROTONIX) 40 MG tablet Take 1 tablet (40 mg total) by mouth daily. 90 tablet 3   potassium chloride SA (K-DUR) 20 MEQ tablet Take 1 tablet (20 mEq total) by mouth daily. 30 tablet 0   pravastatin (PRAVACHOL) 40 MG tablet TAKE 1  TABLET BY MOUTH EVERY EVENING 90 tablet 0   albuterol (PROVENTIL HFA;VENTOLIN HFA) 108 (90 Base) MCG/ACT inhaler Inhale 2 puffs into the lungs every 4 (four) hours as needed for wheezing. 3 Inhaler 6   loratadine (CLARITIN) 10 MG tablet Take 1 tablet (10 mg total) by mouth daily. (Patient taking differently: Take 10 mg by mouth daily as needed for allergies. ) 30 tablet 2   polyethylene glycol-electrolytes (NULYTELY/GOLYTELY) 420 g solution Take 4,000 mLs by mouth once for 1 dose. For colonoscopy prep 4000 mL 0   No current facility-administered medications for this visit.     Allergies Allergies  Allergen Reactions   Banana     Lip swelling   Grape (Artificial) Flavor     Allergic to grapes-swelling    Ibuprofen     REACTION: rash   Penicillins Rash    Did it involve swelling of the face/tongue/throat, SOB, or low BP? No Did it involve sudden or severe rash/hives, skin peeling, or any reaction on the inside of your mouth or nose? No Did you need to seek medical attention at a hospital or doctor's office? No When did it last happen?more than 10 years If all above answers are NO, may proceed with cephalosporin use.     Histories Past Medical History:  Diagnosis Date   Alcohol abuse, in remission    since around 1443   Alcoholic hepatitis    fatty liver on imaging   Allergic rhinitis    Anemia    EGD with duodenal AVM, normal colonoscopy 04/2012   Asthma    Callus of foot 2009   Chest pain, musculoskeletal 2011   Cholelithiasis    [place] 09/2008: Cholelithiasis is present, s/p cholecystectomy   Depression    Excessive cerumen in ear canal    since before 2000   GERD (gastroesophageal reflux disease)    Hyperlipidemia    Hypertension    Menopausal syndrome    Treated with estradiol in the past - discontinued 04/2012   MENOPAUSAL SYNDROME 09/24/2006   2008 Note : The pt is adament about having this medication.  She fully understands the increased  risk of heart disease and cancer that is associated with HRT and is willing to accept this risk in order to prevent the debilitating hot flashes she has off this medication.  Will continue to encourage her to try to wean herself off of this medication at our next visit.  2009 note indicated that she tr   Otitis externa, acute Feb 2012   bilateral, treated with azithromycin after failure of neomycin drops   Otitis media, acute Feb 2012   PONV (postoperative nausea and vomiting)    Past Surgical History:  Procedure Laterality Date   ABDOMINAL HYSTERECTOMY  1980s   CHOLECYSTECTOMY  01/06/09   COLONOSCOPY  05/09/2012   Procedure: COLONOSCOPY;  Surgeon: Inda Castle, MD;  Location: [facility];  Service: Endoscopy;  Laterality: N/A;   ESOPHAGOGASTRODUODENOSCOPY  05/09/2012   Procedure: ESOPHAGOGASTRODUODENOSCOPY (EGD);  Surgeon: Inda Castle, MD;  Location: [facility];  Service: Endoscopy;  Laterality: N/A;   Social History   Socioeconomic History   Marital status: Single  Spouse name: Not on file   Number of children: Not on file   Years of education: Not on file   Highest education level: Not on file  Occupational History   Not on file  Social Needs   Financial resource strain: Not on file   Food insecurity    Worry: Not on file    Inability: Not on file   Transportation needs    Medical: Not on file    Non-medical: Not on file  Tobacco Use   Smoking status: Former Smoker    Types: Cigarettes    Quit date: 08/21/1995    Years since quitting: 23.4   Smokeless tobacco: Never Used  Substance and Sexual Activity   Alcohol use: No    Comment: in remission since 2007   Drug use: No   Sexual activity: Never  Lifestyle   Physical activity    Days per week: Not on file    Minutes per session: Not on file   Stress: Not on file  Relationships   Social connections    Talks on phone: Not on file    Gets together: Not on file    Attends religious  service: Not on file    Active member of club or organization: Not on file    Attends meetings of clubs or organizations: Not on file    Relationship status: Not on file   Intimate partner violence    Fear of current or ex partner: Not on file    Emotionally abused: Not on file    Physically abused: Not on file    Forced sexual activity: Not on file  Other Topics Concern   Not on file  Social History Narrative   2   Family History  Problem Relation Age of Onset   Hypertension Mother    Colon cancer Neg Hx    Esophageal cancer Neg Hx    Inflammatory bowel disease Neg Hx    Liver disease Neg Hx    Pancreatic cancer Neg Hx    Rectal cancer Neg Hx    Stomach cancer Neg Hx    I have reviewed her medical, social, and family history in detail and updated the electronic medical record as necessary.    PHYSICAL EXAMINATION  BP 108/64    Pulse 94    Temp 97.6 F (36.4 C)    Ht 5\' 4"  (1.626 m)    Wt 98 lb 8 oz (44.7 kg)    LMP 08/21/1982    BMI 16.91 kg/m  Wt Readings from Last 3 Encounters:  01/29/19 98 lb 8 oz (44.7 kg)  01/16/19 100 lb (45.4 kg)  12/02/18 96 lb 8 oz (43.8 kg)  GEN: NAD, appears stated age, doesn't appear chronically ill PSYCH: Cooperative, without pressured speech EYE: Conjunctivae pink, sclerae anicteric ENT: MMM, without oral ulcers NECK: Supple CV: RR without R/Gs  RESP: CTAB posteriorly, without wheezing GI: NABS, soft, NT/ND, without rebound or guarding, no HSM appreciated MSK/EXT: No lower extremity edema SKIN: No jaundice NEURO:  Alert & Oriented x 3, no focal deficits   REVIEW OF DATA  I reviewed the following data at the time of this encounter:  GI Procedures and Studies  with a pmh significant for prior alcohol abuse (in cessation since 2000 08/2003), asthma, status post cholecystectomy, GERD, hypertension, hyperlipidemia, small bowel AVMs, iron deficiency anemia.    Laboratory Studies  Reviewed those in epic  Imaging Studies  No  relevant studies to review from last  75 years   ASSESSMENT  Ms. Shuford is a 63 y.o. female with a pmh significant for prior alcohol abuse (in cessation since 2000 08/2003), asthma, status post cholecystectomy, GERD, hypertension, hyperlipidemia, small bowel AVMs, iron deficiency anemia.  The patient is seen today for evaluation and management of:  1. Iron deficiency anemia due to chronic blood loss   2. AVM (arteriovenous malformation) of small bowel, acquired   3. Microcytic anemia   4. Gastroesophageal reflux disease, esophagitis presence not specified    The patient is hemodynamically and clinically stable at this point in time.  I suspect her iron indices will be improved after being reinitiated on oral iron and her prior infusion and IV iron infusion during her hospitalization.  However, because of the recurrent nature of her iron deficiency I do think it is worthwhile based on her history for [place] to consider an endoscopic evaluation.  She would benefit from a upper enteroscopy and a repeat colonoscopy to ensure she has not developed any other angioectasias.  Even if we do not find any angioectasias on our endoscopic exam she may require video capsule endoscopy to ensure she does not have other angiectasia is deeper in the small bowel that may require device assisted enteroscopy.  The understands this but is concerned about the need of a repeat COVID test.  Unfortunately, if the patient were to need APC, it would be best that the patient's procedures be done in the hospital-based setting but COVID-19 test will be required.  The patient will have laboratories performed today for [place] to evaluate where her blood counts are and where her iron indices are.  Based on that we will determine the timing of enteroscopy/colonoscopy.  If she were to have persistent anemia we will likely will increase her iron to twice daily (hopefully she will tolerate this) and get her set up for IV iron infusions.  Her GERD  symptoms are well controlled on current Protonix dosing and we will maintain that.  The risks and benefits of endoscopic evaluation were discussed with the patient; these include but are not limited to the risk of perforation, infection, bleeding, missed lesions, lack of diagnosis, severe illness requiring hospitalization, as well as anesthesia and sedation related illnesses.  The patient is agreeable to proceed.  All patient questions were answered, to the best of my ability, and the patient agrees to the aforementioned plan of action with follow-up as indicated.   PLAN  Laboratories as outlined below Proceed with scheduling enteroscopy/colonoscopy (possible esophageal/gastric/duodenal biopsies to be performed at minimum) Continue iron 150 mg daily but will potentially increase based on iron indices We will consider IV iron infusions Video capsule endoscopy on hold until we see what we find on endoscopic evaluation as well as her body's response to iron   Orders Placed This Encounter  Procedures   CBC   Basic Metabolic Panel (BMET)   IBC + Ferritin   Reticulocytes   Haptoglobin   B12   IgA   Tissue transglutaminase, IgA   INR/PT   Ambulatory referral to Gastroenterology    New Prescriptions   POLYETHYLENE GLYCOL-ELECTROLYTES (NULYTELY/GOLYTELY) 420 G SOLUTION    Take 4,000 mLs by mouth once for 1 dose. For colonoscopy prep   Modified Medications   No medications on file    Planned Follow Up No follow-ups on file.   Justice Britain, MD [facility] Advanced Endoscopy Office # [phone]

## 2019-01-29 NOTE — Patient Instructions (Signed)
If you are age 65 or older, your body mass index should be between 23-30. Your Body mass index is 16.91 kg/m. If this is out of the aforementioned range listed, please consider follow up with your Primary Care Provider.  If you are age 3 or younger, your body mass index should be between 19-25. Your Body mass index is 16.91 kg/m. If this is out of the aformentioned range listed, please consider follow up with your Primary Care Provider.    Your provider has requested that you go to the basement level for lab work before leaving today. Press "B" on the elevator. The lab is located at the first door on the left as you exit the elevator..th  You have been scheduled for a colonoscopy. Please follow written instructions given to you at your visit today.  Please pick up your prep supplies at the pharmacy within the next 1-3 days. If you use inhalers (even only as needed), please bring them with you on the day of your procedure.  Thank you for choosing me and [facility].  Dr. Rush Landmark

## 2019-01-30 LAB — RETICULOCYTES
ABS Retic: 45090 cells/uL (ref 20000–8000)
Retic Ct Pct: 0.9 %

## 2019-01-30 LAB — HAPTOGLOBIN: Haptoglobin: 110 mg/dL (ref 43–212)

## 2019-01-30 LAB — TISSUE TRANSGLUTAMINASE, IGA: (tTG) Ab, IgA: 1 U/mL

## 2019-02-23 ENCOUNTER — Telehealth: Payer: Self-pay | Admitting: Internal Medicine

## 2019-02-23 ENCOUNTER — Telehealth: Payer: Self-pay | Admitting: Gastroenterology

## 2019-02-23 NOTE — Telephone Encounter (Signed)
Pt states she does not have the money to come to appt 10/14, needs later in month, ask hme to cancel appt 10/14 dr Sharon Seller, add to 10/28 1315 dr seawell. Pt wants dr Sharon Seller to know she is not "dodging" her appt, money is just tight. She is reassured that dr Sharon Seller understands

## 2019-02-23 NOTE — Telephone Encounter (Signed)
Pt requesting a nurse to call back. °

## 2019-02-23 NOTE — Telephone Encounter (Signed)
Pt is scheduled for a hospital procedure 03/18/19 and has questions regarding COVID-19 test.

## 2019-02-24 NOTE — Telephone Encounter (Signed)
Returned pt's call. All questions answered.

## 2019-02-24 NOTE — Telephone Encounter (Signed)
Thank you for reassuring her. Definitely understand.

## 2019-02-25 ENCOUNTER — Encounter: Payer: Medicaid Other | Admitting: Internal Medicine

## 2019-03-11 ENCOUNTER — Encounter: Payer: Medicaid Other | Admitting: Internal Medicine

## 2019-03-14 ENCOUNTER — Other Ambulatory Visit (HOSPITAL_COMMUNITY)
Admission: RE | Admit: 2019-03-14 | Discharge: 2019-03-14 | Disposition: A | Discharge status: Home / Self Care | Payer: Medicaid Other | Source: Ambulatory Visit | Attending: Gastroenterology | Admitting: Gastroenterology

## 2019-03-14 DIAGNOSIS — Z01812 Encounter for preprocedural laboratory examination: Secondary | ICD-10-CM | POA: Insufficient documentation

## 2019-03-14 DIAGNOSIS — Z20828 Contact with and (suspected) exposure to other viral communicable diseases: Secondary | ICD-10-CM | POA: Diagnosis not present

## 2019-03-15 LAB — NOVEL CORONAVIRUS, NAA (HOSP ORDER, SEND-OUT TO REF LAB; TAT 18-24 HRS): SARS-CoV-2, NAA: NOT DETECTED

## 2019-03-15 NOTE — Progress Notes (Addendum)
Pt sts she lives alone, and will have no one to take her home or stay with her after the surgery. Pt informed that someone age 65 and over needs to be with her for the first 24 hours after surgery, but she sts she has no one at this time.Pt advised to speak with her surgeon about this matter.

## 2019-03-16 ENCOUNTER — Telehealth: Payer: Self-pay

## 2019-03-16 NOTE — Telephone Encounter (Signed)
Pt had questions about upcoming GI procedure, she is advised to call GI office, she is agreeable

## 2019-03-16 NOTE — Telephone Encounter (Signed)
Requesting to speak with a nurse about something, please call pt back.  

## 2019-03-17 ENCOUNTER — Other Ambulatory Visit: Payer: Self-pay

## 2019-03-17 ENCOUNTER — Encounter (HOSPITAL_COMMUNITY): Payer: Self-pay | Admitting: *Deleted

## 2019-03-18 ENCOUNTER — Encounter (HOSPITAL_COMMUNITY): Payer: Self-pay | Admitting: *Deleted

## 2019-03-18 ENCOUNTER — Ambulatory Visit (HOSPITAL_COMMUNITY): Payer: Medicaid Other | Admitting: Anesthesiology

## 2019-03-18 ENCOUNTER — Ambulatory Visit (HOSPITAL_COMMUNITY)
Admission: RE | Admit: 2019-03-18 | Discharge: 2019-03-18 | Disposition: A | Discharge status: Home / Self Care | Payer: Medicaid Other | Attending: Gastroenterology | Admitting: Gastroenterology

## 2019-03-18 ENCOUNTER — Encounter (HOSPITAL_COMMUNITY)
Admission: RE | Disposition: A | Discharge status: Home / Self Care | Payer: Self-pay | Source: Home / Self Care | Attending: Gastroenterology

## 2019-03-18 DIAGNOSIS — K449 Diaphragmatic hernia without obstruction or gangrene: Secondary | ICD-10-CM | POA: Insufficient documentation

## 2019-03-18 DIAGNOSIS — K5520 Angiodysplasia of colon without hemorrhage: Secondary | ICD-10-CM | POA: Diagnosis not present

## 2019-03-18 DIAGNOSIS — I10 Essential (primary) hypertension: Secondary | ICD-10-CM | POA: Insufficient documentation

## 2019-03-18 DIAGNOSIS — D125 Benign neoplasm of sigmoid colon: Secondary | ICD-10-CM | POA: Insufficient documentation

## 2019-03-18 DIAGNOSIS — K319 Disease of stomach and duodenum, unspecified: Secondary | ICD-10-CM | POA: Insufficient documentation

## 2019-03-18 DIAGNOSIS — K641 Second degree hemorrhoids: Secondary | ICD-10-CM | POA: Diagnosis not present

## 2019-03-18 DIAGNOSIS — K6389 Other specified diseases of intestine: Secondary | ICD-10-CM | POA: Diagnosis not present

## 2019-03-18 DIAGNOSIS — D120 Benign neoplasm of cecum: Secondary | ICD-10-CM | POA: Insufficient documentation

## 2019-03-18 DIAGNOSIS — Z87891 Personal history of nicotine dependence: Secondary | ICD-10-CM | POA: Insufficient documentation

## 2019-03-18 DIAGNOSIS — R12 Heartburn: Secondary | ICD-10-CM | POA: Diagnosis not present

## 2019-03-18 DIAGNOSIS — D509 Iron deficiency anemia, unspecified: Secondary | ICD-10-CM | POA: Diagnosis present

## 2019-03-18 DIAGNOSIS — K31819 Angiodysplasia of stomach and duodenum without bleeding: Secondary | ICD-10-CM | POA: Insufficient documentation

## 2019-03-18 DIAGNOSIS — K635 Polyp of colon: Secondary | ICD-10-CM

## 2019-03-18 DIAGNOSIS — D500 Iron deficiency anemia secondary to blood loss (chronic): Secondary | ICD-10-CM

## 2019-03-18 DIAGNOSIS — K219 Gastro-esophageal reflux disease without esophagitis: Secondary | ICD-10-CM

## 2019-03-18 HISTORY — PX: SUBMUCOSAL TATTOO INJECTION: SHX6856

## 2019-03-18 HISTORY — PX: BIOPSY: SHX5522

## 2019-03-18 HISTORY — PX: COLONOSCOPY WITH PROPOFOL: SHX5780

## 2019-03-18 HISTORY — PX: HOT HEMOSTASIS: SHX5433

## 2019-03-18 HISTORY — PX: HEMOSTASIS CLIP PLACEMENT: SHX6857

## 2019-03-18 HISTORY — DX: Personal history of other medical treatment: Z92.89

## 2019-03-18 HISTORY — PX: ENTEROSCOPY: SHX5533

## 2019-03-18 HISTORY — PX: POLYPECTOMY: SHX5525

## 2019-03-18 SURGERY — COLONOSCOPY WITH PROPOFOL
Anesthesia: Monitor Anesthesia Care

## 2019-03-18 MED ORDER — SPOT INK MARKER SYRINGE KIT
PACK | SUBMUCOSAL | Status: DC | PRN
  Administered 2019-03-18: 2.5 mL via SUBMUCOSAL

## 2019-03-18 MED ORDER — SODIUM CHLORIDE 0.9 % IV SOLN: INTRAVENOUS | Status: DC

## 2019-03-18 MED ORDER — PROPOFOL 500 MG/50ML IV EMUL
INTRAVENOUS | Status: DC | PRN
  Administered 2019-03-18: 50 ug/kg/min via INTRAVENOUS

## 2019-03-18 MED ORDER — LIDOCAINE HCL (CARDIAC) PF 100 MG/5ML IV SOSY
PREFILLED_SYRINGE | INTRAVENOUS | Status: DC | PRN
  Administered 2019-03-18: 40 mg via INTRAVENOUS

## 2019-03-18 MED ORDER — LACTATED RINGERS IV SOLN
INTRAVENOUS | Status: DC
  Administered 2019-03-18 (×2): via INTRAVENOUS

## 2019-03-18 MED ORDER — DEXMEDETOMIDINE HCL 200 MCG/2ML IV SOLN
INTRAVENOUS | Status: DC | PRN
  Administered 2019-03-18: 4 ug via INTRAVENOUS
  Administered 2019-03-18: 12 ug via INTRAVENOUS
  Administered 2019-03-18: 4 ug via INTRAVENOUS
  Administered 2019-03-18: 15 ug via INTRAVENOUS
  Administered 2019-03-18: 4 ug via INTRAVENOUS
  Administered 2019-03-18: 8 ug via INTRAVENOUS
  Administered 2019-03-18 (×2): 4 ug via INTRAVENOUS

## 2019-03-18 MED ORDER — PROPOFOL 10 MG/ML IV BOLUS
INTRAVENOUS | Status: DC | PRN
  Administered 2019-03-18 (×2): 11 mg via INTRAVENOUS
  Administered 2019-03-18: 12 mg via INTRAVENOUS
  Administered 2019-03-18: 15 mg via INTRAVENOUS
  Administered 2019-03-18: 22 mg via INTRAVENOUS

## 2019-03-18 SURGICAL SUPPLY — 22 items

## 2019-03-18 NOTE — Anesthesia Preprocedure Evaluation (Addendum)
Anesthesia Evaluation  Patient identified by MRN, date of birth, ID band Patient awake    Reviewed: Allergy & Precautions, NPO status , Patient's Chart, lab work & pertinent test results  History of Anesthesia Complications (+) PONVNegative for: history of anesthetic complications  Airway Mallampati: II  TM Distance: >3 FB Neck ROM: Full    Dental   Pulmonary asthma , former smoker,    Pulmonary exam normal        Cardiovascular hypertension, Normal cardiovascular exam     Neuro/Psych PSYCHIATRIC DISORDERS Depression negative neurological ROS     GI/Hepatic GERD  ,(+)     substance abuse  alcohol use, Hepatitis - (alcoholic)  Endo/Other  negative endocrine ROS  Renal/GU negative Renal ROS  negative genitourinary   Musculoskeletal negative musculoskeletal ROS (+)   Abdominal   Peds  Hematology negative hematology ROS (+) anemia ,   Anesthesia Other Findings   Reproductive/Obstetrics                            Anesthesia Physical Anesthesia Plan  ASA: III  Anesthesia Plan: MAC   Post-op Pain Management:    Induction: Intravenous  PONV Risk Score and Plan: 3 and Propofol infusion, TIVA and Treatment may vary due to age or medical condition  Airway Management Planned: Natural Airway, Nasal Cannula and Simple Face Mask  Additional Equipment: None  Intra-op Plan:   Post-operative Plan:   Informed Consent: I have reviewed the patients History and Physical, chart, labs and discussed the procedure including the risks, benefits and alternatives for the proposed anesthesia with the patient or authorized representative who has indicated his/her understanding and acceptance.       Plan Discussed with:   Anesthesia Plan Comments:        Anesthesia Quick Evaluation

## 2019-03-18 NOTE — Anesthesia Postprocedure Evaluation (Signed)
Anesthesia Post Note  Patient: Kimberly [name]  Procedure(s) Performed: COLONOSCOPY WITH PROPOFOL (N/A ) ENTEROSCOPY (N/A ) SUBMUCOSAL TATTOO INJECTION HOT HEMOSTASIS (ARGON PLASMA COAGULATION/BICAP) (N/A ) BIOPSY HEMOSTASIS CLIP PLACEMENT POLYPECTOMY     Patient location during evaluation: Endoscopy Anesthesia Type: MAC Level of consciousness: awake and alert Pain management: pain level controlled Vital Signs Assessment: post-procedure vital signs reviewed and stable Respiratory status: spontaneous breathing, nonlabored ventilation and respiratory function stable Cardiovascular status: blood pressure returned to baseline and stable Postop Assessment: no apparent nausea or vomiting Anesthetic complications: no    Last Vitals:  Vitals:   03/18/19 1030 03/18/19 1040  BP: 125/71 (!) 116/54  Pulse:  74  Resp:  (!) 23  Temp:    SpO2:  99%    Last Pain:  Vitals:   03/18/19 1040  TempSrc:   PainSc: 0-No pain                 Lidia Collum

## 2019-03-18 NOTE — Transfer of Care (Signed)
Immediate Anesthesia Transfer of Care Note  Patient: Kimberly [name]  Procedure(s) Performed: COLONOSCOPY WITH PROPOFOL (N/A ) ENTEROSCOPY (N/A ) SUBMUCOSAL TATTOO INJECTION HOT HEMOSTASIS (ARGON PLASMA COAGULATION/BICAP) (N/A ) BIOPSY HEMOSTASIS CLIP PLACEMENT POLYPECTOMY  Patient Location: Endoscopy Unit  Anesthesia Type:MAC  Level of Consciousness: awake, alert  and oriented  Airway & Oxygen Therapy: Patient Spontanous Breathing and Patient connected to nasal cannula oxygen  Post-op Assessment: Report given to RN, Post -op Vital signs reviewed and stable and Patient moving all extremities X 4  Post vital signs: Reviewed and stable  Last Vitals:  Vitals Value Taken Time  BP    Temp    Pulse    Resp    SpO2      Last Pain:  Vitals:   03/18/19 0805  TempSrc: Oral  PainSc: 0-No pain         Complications: No apparent anesthesia complications

## 2019-03-18 NOTE — Discharge Instructions (Signed)
YOU HAD AN ENDOSCOPIC PROCEDURE TODAY: Refer to the procedure report and other information in the discharge instructions given to you for any specific questions about what was found during the examination. If this information does not answer your questions, please call [facility] [place] office at [phone] to clarify.  ° °YOU SHOULD EXPECT: Some feelings of bloating in the abdomen. Passage of more gas than usual. Walking can help get rid of the air that was put into your GI tract during the procedure and reduce the bloating. If you had a lower endoscopy (such as a colonoscopy or flexible sigmoidoscopy) you may notice spotting of blood in your stool or on the toilet paper. Some abdominal soreness may be present for a day or two, also. ° °DIET: Your first meal following the procedure should be a light meal and then it is ok to progress to your normal diet. A half-sandwich or bowl of soup is an example of a good first meal. Heavy or fried foods are harder to digest and may make you feel nauseous or bloated. Drink plenty of fluids but you should avoid alcoholic beverages for 24 hours. If you had a esophageal dilation, please see attached instructions for diet.   ° °ACTIVITY: Your care partner should take you home directly after the procedure. You should plan to take it easy, moving slowly for the rest of the day. You can resume normal activity the day after the procedure however YOU SHOULD NOT DRIVE, use power tools, machinery or perform tasks that involve climbing or major physical exertion for 24 hours (because of the sedation medicines used during the test).  ° °SYMPTOMS TO REPORT IMMEDIATELY: °A gastroenterologist can be reached at any hour. Please call [phone]  for any of the following symptoms:  °Following lower endoscopy (colonoscopy, flexible sigmoidoscopy) °Excessive amounts of blood in the stool  °Significant tenderness, worsening of abdominal pains  °Swelling of the abdomen that is new, acute  °Fever of 100° or  higher  °Following upper endoscopy (EGD, EUS, ERCP, esophageal dilation) °Vomiting of blood or coffee ground material  °New, significant abdominal pain  °New, significant chest pain or pain under the shoulder blades  °Painful or persistently difficult swallowing  °New shortness of breath  °Black, tarry-looking or red, bloody stools ° °FOLLOW UP:  °If any biopsies were taken you will be contacted by phone or by letter within the next 1-3 weeks. Call [phone]  if you have not heard about the biopsies in 3 weeks.  °Please also call with any specific questions about appointments or follow up tests. ° °

## 2019-03-18 NOTE — H&P (Signed)
GASTROENTEROLOGY PROCEDURE H&P NOTE   Primary Care Physician: [name], DO  HPI: Kimberly [name] is a 63 y.o. female who presents for EGD/Enteroscopy/Colonoscopy.  Past Medical History:  Diagnosis Date  . Alcohol abuse, in remission    since around 2005  . Alcoholic hepatitis    fatty liver on imaging- Treated  . Allergic rhinitis   . Anemia    EGD with duodenal AVM, normal colonoscopy 04/2012  . Asthma   . Callus of foot 2009  . Chest pain, musculoskeletal 2011  . Cholelithiasis    [place] 09/2008: Cholelithiasis is present, s/p cholecystectomy  . Depression   . Excessive cerumen in ear canal    since before 2000  . GERD (gastroesophageal reflux disease)   . History of blood transfusion   . Hyperlipidemia   . Hypertension   . Menopausal syndrome    Treated with estradiol in the past - discontinued 04/2012  . MENOPAUSAL SYNDROME 09/24/2006   2008 Note : The pt is adament about having this medication.  She fully understands the increased risk of heart disease and cancer that is associated with HRT and is willing to accept this risk in order to prevent the debilitating hot flashes she has off this medication.  Will continue to encourage her to try to wean herself off of this medication at our next visit.  2009 note indicated that she tr  . Otitis externa, acute Feb 2012   bilateral, treated with azithromycin after failure of neomycin drops  . Otitis media, acute Feb 2012  . PONV (postoperative nausea and vomiting)    Past Surgical History:  Procedure Laterality Date  . ABDOMINAL HYSTERECTOMY  1980s  . CHOLECYSTECTOMY  01/06/09  . COLONOSCOPY  05/09/2012   Procedure: COLONOSCOPY;  Surgeon: Inda Castle, MD;  Location: [place];  Service: Endoscopy;  Laterality: N/A;  . ESOPHAGOGASTRODUODENOSCOPY  05/09/2012   Procedure: ESOPHAGOGASTRODUODENOSCOPY (EGD);  Surgeon: Inda Castle, MD;  Location: [facility];  Service: Endoscopy;  Laterality: N/A;   Current  Facility-Administered Medications  Medication Dose Route Frequency Provider Last Rate Last Dose  . 0.9 %  sodium chloride infusion   Intravenous Continuous Mansouraty, Telford Nab., MD       Allergies  Allergen Reactions  . Banana     Lip swelling  . Grape (Artificial) Flavor Swelling    Allergic to grapes  . Ibuprofen Rash  . Penicillins Rash    Did it involve swelling of the face/tongue/throat, SOB, or low BP? No Did it involve sudden or severe rash/hives, skin peeling, or any reaction on the inside of your mouth or nose? No Did you need to seek medical attention at a hospital or doctor's office? No When did it last happen?more than 10 years If all above answers are "NO", may proceed with cephalosporin use.    Family History  Problem Relation Age of Onset  . Hypertension Mother   . Colon cancer Neg Hx   . Esophageal cancer Neg Hx   . Inflammatory bowel disease Neg Hx   . Liver disease Neg Hx   . Pancreatic cancer Neg Hx   . Rectal cancer Neg Hx   . Stomach cancer Neg Hx    Social History   Socioeconomic History  . Marital status: Single    Spouse name: Not on file  . Number of children: Not on file  . Years of education: Not on file  . Highest education level: Not on file  Occupational History  .  Not on file  Social Needs  . Financial resource strain: Not on file  . Food insecurity    Worry: Not on file    Inability: Not on file  . Transportation needs    Medical: Not on file    Non-medical: Not on file  Tobacco Use  . Smoking status: Former Smoker    Types: Cigarettes    Quit date: 08/21/1995    Years since quitting: 23.5  . Smokeless tobacco: Never Used  Substance and Sexual Activity  . Alcohol use: No    Comment: in remission since 2007  . Drug use: No  . Sexual activity: Never  Lifestyle  . Physical activity    Days per week: Not on file    Minutes per session: Not on file  . Stress: Not on file  Relationships  . Social Herbalist  on phone: Not on file    Gets together: Not on file    Attends religious service: Not on file    Active member of club or organization: Not on file    Attends meetings of clubs or organizations: Not on file    Relationship status: Not on file  . Intimate partner violence    Fear of current or ex partner: Not on file    Emotionally abused: Not on file    Physically abused: Not on file    Forced sexual activity: Not on file  Other Topics Concern  . Not on file  Social History Narrative   2    Physical Exam: Vital signs in last 24 hours: Temp:  [98.4 F (36.9 C)] 98.4 F (36.9 C) (10/21 0805) Pulse Rate:  [89] 89 (10/21 0805) Resp:  [24] 24 (10/21 0805) BP: (135)/(82) 135/82 (10/21 0805) SpO2:  [100 %] 100 % (10/21 0805) Weight:  [45.4 kg] 45.4 kg (10/21 0805)   GEN: NAD EYE: Sclerae anicteric ENT: MMM CV: Non-tachycardic GI: Soft, NT/ND NEURO:  Alert & Oriented x 3  Lab Results: No results for input(s): WBC, HGB, HCT, PLT in the last 72 hours. BMET No results for input(s): NA, K, CL, CO2, GLUCOSE, BUN, CREATININE, CALCIUM in the last 72 hours. LFT No results for input(s): PROT, ALBUMIN, AST, ALT, ALKPHOS, BILITOT, BILIDIR, IBILI in the last 72 hours. PT/INR No results for input(s): LABPROT, INR in the last 72 hours.   Impression / Plan: This is a 63 y.o.female who presents for EGD/Enteroscopy/Colonoscopy.  The risks and benefits of endoscopic evaluation were discussed with the patient; these include but are not limited to the risk of perforation, infection, bleeding, missed lesions, lack of diagnosis, severe illness requiring hospitalization, as well as anesthesia and sedation related illnesses.  The patient is agreeable to proceed.    Justice Britain, MD [facility] Advanced [facility]oscopy Office # [phone]

## 2019-03-18 NOTE — Op Note (Signed)
[facility] Patient Name: Kimberly [name] Procedure Date : 03/18/2019 MRN: [id number] Attending MD: Justice Britain , MD Date of Birth: [birth year] CSN: [id number] Age: 63 Admit Type: Outpatient Procedure:                Small bowel enteroscopy Indications:              Iron deficiency anemia, Exclusion of angioectasia,                            Heartburn Providers:                Justice Britain, MD, Jeanella Cara, RN,                            Ladona Ridgel, Technician Referring MD:             Laurell Roof. [place] Medicines:                Monitored Anesthesia Care Complications:            No immediate complications. Estimated Blood Loss:     Estimated blood loss was minimal. Procedure:                Pre-Anesthesia Assessment:                           - Prior to the procedure, a History and Physical                            was performed, and patient medications and                            allergies were reviewed. The patient's tolerance of                            previous anesthesia was also reviewed. The risks                            and benefits of the procedure and the sedation                            options and risks were discussed with the patient.                            All questions were answered, and informed consent                            was obtained. Prior Anticoagulants: The patient has                            taken no previous anticoagulant or antiplatelet                            agents. ASA Grade Assessment: III - A patient with  severe systemic disease. After reviewing the risks                            and benefits, the patient was deemed in                            satisfactory condition to undergo the procedure.                           After obtaining informed consent, the endoscope was                            passed under direct vision. Throughout the           procedure, the patient's blood pressure, pulse, and                            oxygen saturations were monitored continuously. The                            PCF-H190DL ([id number]) Olympus pediatric colonoscope                            was introduced through the mouth and advanced to                            the proximal jejunum. The small bowel enteroscopy                            was accomplished without difficulty. The patient                            tolerated the procedure. Scope In: Scope Out: Findings:      No gross lesions were noted in the entire esophagus. Biopsies were taken       with a cold forceps for histology.      A 2 cm hiatal hernia was present.      Patchy mildly erythematous mucosa without bleeding was found in the       gastric body and in the gastric antrum. Biopsies were taken with a cold       forceps for histology and HP evaluation.      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 Fulguration to ablate the       lesion to prevent bleeding by argon plasma was successful.      Normal mucosa was found in the entire visualized duodenum otherwise.      Five angioectasias with no bleeding were found in the proximal jejunum.       Fulguration to ablate the lesion to prevent bleeding by argon plasma was       successful. To prevent bleeding post-intervention on one Angioectasia       ablation, one hemostatic clip was successfully placed (MR conditional).       There was no bleeding at the end of the procedure.      Normal mucosa was found  in the rest of the visualized proximal jejunum.       Area was tattooed with an injection of Spot (carbon black) to demarcate       distal extent of today's SBE.      Biopsies for histology were taken with a cold forceps in the duodenum       and examined portion of jejunum for evaluation of celiac disease. Impression:               - No  gross lesions in esophagus. Biopsied.                           - 2 cm hiatal hernia.                           - Erythematous mucosa in the gastric body and                            antrum. Biopsied.                           - Four non-bleeding angioectasias in the duodenum.                            Treated with argon plasma coagulation (APC).                           - Normal mucosa was found in the entire examined                            duodenum.                           - Five non-bleeding angioectasias in the jejunum.                            Treated with argon plasma coagulation (APC). On one                            of the lesions a clip (MR conditional) was placed                            to decrease risk of bleeding post-intervention.                           - Normal mucosa was found in the jejunum. Tattooed.                           - Biopsies were taken of the small bowel with a                            cold forceps for evaluation of celiac disease. Recommendation:           - Proceed to scheduled colonoscopy.                           -  Increase PPI to 40 mg BID x 81-month and then                            decrease back to 40 mg daily (Rx to be sent to                            pharmacy).                           - Await pathology results.                           - The findings and recommendations were discussed                            with the patient. Procedure Code(s):        --- Professional ---                           [phone], Small intestinal endoscopy, enteroscopy                            beyond second portion of duodenum, not including                            ileum; with biopsy, single or multiple                           [id number], Unlisted procedure, small intestine Diagnosis Code(s):        --- Professional ---                           K44.9, Diaphragmatic hernia without obstruction or                            gangrene                            K31.89, Other diseases of stomach and duodenum                           K31.819, Angiodysplasia of stomach and duodenum                            without bleeding                           K55.20, Angiodysplasia of colon without hemorrhage                           D50.9, Iron deficiency anemia, unspecified                           R12, Heartburn CPT copyright 2019 American Medical Association. All rights reserved. The codes documented in this report are preliminary and upon coder review may  be revised to meet current compliance requirements. [employer],  MD 03/18/2019 10:31:28 AM Number of Addenda: 0

## 2019-03-18 NOTE — Op Note (Signed)
[facility] Patient Name: Kimberly [name] Procedure Date : 03/18/2019 MRN: [id number] Attending MD: Justice Britain , MD Date of Birth: 12-Sep-1955 CSN: [id number] Age: 63 Admit Type: Outpatient Procedure:                Colonoscopy Indications:              Iron deficiency anemia Providers:                Justice Britain, MD, Jeanella Cara, RN,                            Ladona Ridgel, Technician Referring MD:              Medicines:                Monitored Anesthesia Care Complications:            No immediate complications. Estimated Blood Loss:     Estimated blood loss was minimal. Procedure:                Pre-Anesthesia Assessment:                           - Prior to the procedure, a History and Physical                            was performed, and patient medications and                            allergies were reviewed. The patient's tolerance of                            previous anesthesia was also reviewed. The risks                            and benefits of the procedure and the sedation                            options and risks were discussed with the patient.                            All questions were answered, and informed consent                            was obtained. Prior Anticoagulants: The patient has                            taken no previous anticoagulant or antiplatelet                            agents. ASA Grade Assessment: III - A patient with                            severe systemic disease. After reviewing the risks  and benefits, the patient was deemed in                            satisfactory condition to undergo the procedure.                           After obtaining informed consent, the colonoscope                            was passed under direct vision. Throughout the                            procedure, the patient's blood pressure, pulse, and   oxygen saturations were monitored continuously. The                            PCF-H190DL ([id number]) Olympus pediatric colonoscope                            was introduced through the anus and advanced to the                            the cecum, identified by appendiceal orifice and                            ileocecal valve. The colonoscopy was performed                            without difficulty. The patient tolerated the                            procedure. The quality of the bowel preparation was                            good. The ileocecal valve, appendiceal orifice, and                            rectum were photographed. Scope In: 9:59:49 AM Scope Out: 10:13:20 AM Scope Withdrawal Time: 0 hours 10 minutes 48 seconds  Total Procedure Duration: 0 hours 13 minutes 31 seconds  Findings:      The digital rectal exam findings include hemorrhoids. Pertinent       negatives include no palpable rectal lesions.      Two sessile polyps were found in the sigmoid colon and cecum. The polyps       were 2 to 3 mm in size. These polyps were removed with a cold snare.       Resection and retrieval were complete.      Normal mucosa was found in the entire colon otherwise.      Non-bleeding non-thrombosed internal hemorrhoids were found during       retroflexion, during perianal exam and during digital exam. The       hemorrhoids were Grade II (internal hemorrhoids that prolapse but reduce       spontaneously). Impression:               - Hemorrhoids  found on digital rectal exam.                           - Two 2 to 3 mm polyps in the sigmoid colon and in                            the cecum, removed with a cold snare. Resected and                            retrieved.                           - Normal mucosa in the entire examined colon.                           - Non-bleeding non-thrombosed internal hemorrhoids. Recommendation:           - The patient will be observed post-procedure,                             until all discharge criteria are met.                           - Discharge patient to home.                           - Patient has a contact number available for                            emergencies. The signs and symptoms of potential                            delayed complications were discussed with the                            patient. Return to normal activities tomorrow.                            Written discharge instructions were provided to the                            patient.                           - High fiber diet.                           - Use FiberCon 1 tablet PO daily.                           - Continue present medications.                           - No aspirin, ibuprofen, naproxen, or other  non-steroidal anti-inflammatory drugs for 1 week.                           - Await pathology results.                           - Repeat colonoscopy 7/10 years for surveillance                            based on pathology results.                           - Repeat CBC/Iron/TIBC/Ferritin in 35-month with                            follow up in clinic thereafter (as long as patient                            is feeling well).                           - If recurrent IDA develops would plan to proceed                            with IV Iron infusions once again and then consider                            VCE.                           - The findings and recommendations were discussed                            with the patient. Procedure Code(s):        --- Professional ---                           [phone] Colonoscopy, flexible; with removal of                            tumor(s), polyp(s), or other lesion(s) by snare                            technique Diagnosis Code(s):        --- Professional ---                           K64.1, Second degree hemorrhoids                           K63.5, Polyp of colon                            D50.9, Iron deficiency anemia, unspecified CPT copyright 2019 American Medical Association. All rights reserved. The codes documented in this report are preliminary and upon coder review may  be revised to meet current compliance requirements.  Justice Britain, MD 03/18/2019 10:36:58 AM Number of Addenda: 0

## 2019-03-19 ENCOUNTER — Encounter: Payer: Self-pay | Admitting: Gastroenterology

## 2019-03-19 ENCOUNTER — Other Ambulatory Visit: Payer: Self-pay

## 2019-03-19 ENCOUNTER — Telehealth: Payer: Self-pay | Admitting: Gastroenterology

## 2019-03-19 DIAGNOSIS — D500 Iron deficiency anemia secondary to blood loss (chronic): Secondary | ICD-10-CM

## 2019-03-19 LAB — SURGICAL PATHOLOGY

## 2019-03-19 NOTE — Telephone Encounter (Signed)
The pt has been advised and will call if she does not improve or worsens

## 2019-03-19 NOTE — Telephone Encounter (Signed)
The pt says that she has a slight sore throat that has gotten some better.  She was advised to take some tylenol (as directed on the bottle) and if the pain does not continue to improve she should call back.  The pt has been advised of the information and verbalized understanding.

## 2019-03-19 NOTE — Telephone Encounter (Signed)
OK. She may also use Chloroseptic or Cepacol lozenges for the next 72 hours. Thank you. GM

## 2019-03-20 ENCOUNTER — Encounter (HOSPITAL_COMMUNITY): Payer: Self-pay | Admitting: Gastroenterology

## 2019-03-20 NOTE — Telephone Encounter (Signed)
The pt has been advised to get cepacol lozenges or chloraseptic and use for the next couple of days and call back if she has no improvement.  The pt has been advised of the information and verbalized understanding.

## 2019-03-25 ENCOUNTER — Encounter: Payer: Medicaid Other | Admitting: Internal Medicine

## 2019-03-25 ENCOUNTER — Telehealth: Payer: Self-pay | Admitting: Gastroenterology

## 2019-03-25 ENCOUNTER — Telehealth: Payer: Self-pay

## 2019-03-25 DIAGNOSIS — K219 Gastro-esophageal reflux disease without esophagitis: Secondary | ICD-10-CM

## 2019-03-25 MED ORDER — PANTOPRAZOLE SODIUM 40 MG PO TBEC: DELAYED_RELEASE_TABLET | ORAL | 1 refills | Status: DC

## 2019-03-25 NOTE — Telephone Encounter (Signed)
CMA Amedeo Gory has taken care of this. Thank you. GM

## 2019-03-25 NOTE — Telephone Encounter (Signed)
Pt needs prescritpion for protonix, she states that she takes two in the morning and two in the evening.

## 2019-03-25 NOTE — Telephone Encounter (Signed)
Pt states she was told by dr Rush Landmark to take 2 protonix 40mg  in am and 2 in pm, I do not see record of this, pt was ask to call dr Rush Landmark office and request that he send a script in for what he is prescribing

## 2019-03-25 NOTE — Telephone Encounter (Signed)
New rx for Pantoprazole 40mg  twice daily x1 month, then decrease back to 40mg  once daily thereafter has been sent to pt's pharmacy. See results notes from small bowel enteroscopy if there are any questions. Pt has been informed.

## 2019-03-25 NOTE — Telephone Encounter (Signed)
Requesting to speak with a nurse about taking pantoprazole (PROTONIX) 40 MG tablet. Please call pt back.

## 2019-04-02 ENCOUNTER — Telehealth: Payer: Self-pay | Admitting: Gastroenterology

## 2019-04-02 ENCOUNTER — Telehealth: Payer: Self-pay | Admitting: Internal Medicine

## 2019-04-02 NOTE — Telephone Encounter (Signed)
Called pt - stated GI doctor wants her to take iron medication twice a day for 1 month and she wanted to know why. Stated she did not want to become constipated. She had endoscopy on 10/21. I told her let call their office to find out why. Stated she will call.

## 2019-04-02 NOTE — Telephone Encounter (Signed)
Pt informed that she is already taking something for acid reflux( Pantoprazole). Pt states that she is having some constipation. I advised pt that she taking iron supplements and that can cause constipation. Advised pt to start Miralax 17g daily dissloved in at least 8 ounces of water/juice daily to help with bowel movements. Pt voiced understanding and will let us know if this does not get better.

## 2019-04-02 NOTE — Telephone Encounter (Signed)
Pt requesting a callback [phone]

## 2019-04-09 ENCOUNTER — Telehealth: Payer: Self-pay | Admitting: Gastroenterology

## 2019-04-09 NOTE — Telephone Encounter (Signed)
Thanks for reaching out. Simethicone 125 mg QID.   She can get a Rx from [place] or she can get Gas-X extra strength to use. Follow up in clinic with me or with one of our Pas in a few weeks. May consider in future SIBO breath testing and/or EPI evaluation. Thanks. GM

## 2019-04-09 NOTE — Telephone Encounter (Signed)
Dr Rush Landmark, pt has been advised several times that she is already taking Pantoprazole for her acid reflux , as well as Align probiotic to help with gas and bloating. Do you have any other recommendation other than pt needs to schedule follow-up office visit for recurrent symptoms?

## 2019-04-13 NOTE — Telephone Encounter (Signed)
Pt advise to try Simethicone 125mg  QID and continue Pantoprazole. If symptoms continue then pt will need to schedule office visit.

## 2019-04-14 ENCOUNTER — Telehealth: Payer: Self-pay | Admitting: Gastroenterology

## 2019-04-14 ENCOUNTER — Other Ambulatory Visit: Payer: Self-pay

## 2019-04-14 ENCOUNTER — Telehealth: Payer: Self-pay | Admitting: Internal Medicine

## 2019-04-14 NOTE — Telephone Encounter (Signed)
Pt called stating she had spoke to pharmacists and explained she needs to take 2 x daily and they are going to refill for her.  Pt just wanted to let you know.

## 2019-04-14 NOTE — Telephone Encounter (Signed)
Pt's gi doctor prescribes this med, not imc, called pharm and they will speak w/ pt, called pt and referred her to dr Rush Landmark office.

## 2019-04-14 NOTE — Telephone Encounter (Signed)
Please requesting a nurse to call back.

## 2019-04-14 NOTE — Telephone Encounter (Signed)
She states that she has cold in her ear from laying on that cold table for her procedure and air blowing on her, she refuses appt at this time, reassured her and told her to call back for appt

## 2019-04-14 NOTE — Telephone Encounter (Signed)
pantoprazole (PROTONIX) 40 MG tablet, REFILL REQUEST @  [place] ([place]), [place] - [address] [phone] (Phone) [phone] (Fax)

## 2019-04-15 ENCOUNTER — Encounter: Payer: Medicaid Other | Admitting: Internal Medicine

## 2019-04-15 NOTE — Telephone Encounter (Addendum)
Pt advise that she needs office visit for recurrent symptoms. Pt voiced understanding. Appt made.

## 2019-04-15 NOTE — Telephone Encounter (Signed)
Pt informed that rx for Pantoprazole was recently sent to pharmacy with refills.

## 2019-04-21 ENCOUNTER — Telehealth: Payer: Self-pay | Admitting: Internal Medicine

## 2019-04-21 NOTE — Telephone Encounter (Signed)
Pt states both of her ears are aching and throat is hurting.  Offered patient an appointment but she has no transportation. Please call back.

## 2019-04-21 NOTE — Telephone Encounter (Signed)
Agree with plan. If she's unable to get a ride she could also do a tele visit although may not be as beneficial.

## 2019-04-21 NOTE — Telephone Encounter (Signed)
rtc to pt, got an identifying vmail and lm for pt to go to [facility] if she finds a ride also to call clinic back for questions

## 2019-04-27 ENCOUNTER — Encounter: Payer: Self-pay | Admitting: Internal Medicine

## 2019-04-27 ENCOUNTER — Ambulatory Visit (INDEPENDENT_AMBULATORY_CARE_PROVIDER_SITE_OTHER): Payer: Medicare Other | Admitting: Internal Medicine

## 2019-04-27 ENCOUNTER — Other Ambulatory Visit: Payer: Self-pay

## 2019-04-27 ENCOUNTER — Telehealth: Payer: Self-pay | Admitting: Internal Medicine

## 2019-04-27 DIAGNOSIS — Z9889 Other specified postprocedural states: Secondary | ICD-10-CM

## 2019-04-27 DIAGNOSIS — J029 Acute pharyngitis, unspecified: Secondary | ICD-10-CM

## 2019-04-27 DIAGNOSIS — Z886 Allergy status to analgesic agent status: Secondary | ICD-10-CM

## 2019-04-27 DIAGNOSIS — H9203 Otalgia, bilateral: Secondary | ICD-10-CM | POA: Insufficient documentation

## 2019-04-27 NOTE — Progress Notes (Signed)
  [facility] Internal Medicine Residency Telephone Encounter Continuity Care Appointment  HPI:   This telephone encounter was created for [name] on 04/27/2019 for the following purpose/cc Ear Pain.   Past Medical History:  Past Medical History:  Diagnosis Date  . Alcohol abuse, in remission    since around 2005  . Alcoholic hepatitis    fatty liver on imaging- Treated  . Allergic rhinitis   . Anemia    EGD with duodenal AVM, normal colonoscopy 04/2012  . Asthma   . Callus of foot 2009  . Chest pain, musculoskeletal 2011  . Cholelithiasis    [place] 09/2008: Cholelithiasis is present, s/p cholecystectomy  . Depression   . Excessive cerumen in ear canal    since before 2000  . GERD (gastroesophageal reflux disease)   . History of blood transfusion   . Hyperlipidemia   . Hypertension   . Menopausal syndrome    Treated with estradiol in the past - discontinued 04/2012  . MENOPAUSAL SYNDROME 09/24/2006   2008 Note : The pt is adament about having this medication.  She fully understands the increased risk of heart disease and cancer that is associated with HRT and is willing to accept this risk in order to prevent the debilitating hot flashes she has off this medication.  Will continue to encourage her to try to wean herself off of this medication at our next visit.  2009 note indicated that she tr  . Otitis externa, acute Feb 2012   bilateral, treated with azithromycin after failure of neomycin drops  . Otitis media, acute Feb 2012  . PONV (postoperative nausea and vomiting)       ROS:  Review of Systems  Constitutional: Negative for chills, fever, malaise/fatigue and weight loss.  HENT: Positive for ear pain and sore throat. Negative for congestion, ear discharge, hearing loss and sinus pain.   Eyes: Negative for blurred vision and photophobia.  Respiratory: Negative for shortness of breath.   Gastrointestinal: Negative for abdominal pain, nausea and vomiting.   Musculoskeletal: Negative for myalgias.  Skin: Negative for rash.  Neurological: Negative for headaches.     Assessment / Plan / Recommendations:   Please see A&P under problem oriented charting for assessment of the patient's acute and chronic medical conditions.   As always, pt is advised that if symptoms worsen or new symptoms arise, they should go to an urgent care facility or to to ER for further evaluation.   Consent and Medical Decision Making:   Patient seen with Dr. Daryll Drown  This is a telephone encounter between Kimberly [name] and Kimberly [name] on 04/27/2019 for Ear Pain. The visit was conducted with the patient located at home and Kimberly [name] at [facility] The patient's identity was confirmed using their DOB and current address. The patient has consented to being evaluated through a telephone encounter and understands the associated risks (an examination cannot be done and the patient may need to come in for an appointment) / benefits (allows the patient to remain at home, decreasing exposure to coronavirus). I personally spent 9 minutes on medical discussion.   I connected with  Kimberly [name] on 04/27/19 by a telemedicine application and verified that I am speaking with the correct person using two identifiers.  I discussed the limitations of evaluation and management by telemedicine. The patient expressed understanding and agreed to proceed.

## 2019-04-27 NOTE — Telephone Encounter (Signed)
Pt calling to report she called on 04/21/2019 and sch an appt to come in to the [facility] on 04/29/2019.Pt states has gotten worse and is breaking out in sweats.   Patient is requesting a Telehealth visit over the phone instead of coming in and would like for a nurse to call her back.

## 2019-04-27 NOTE — Assessment & Plan Note (Signed)
HPI:  Patient endorses bilateral ear pain since 03/18/2019 s/p colonoscopy. Patient states that during the operation she was on the table "for who knows how long," and noticed having a sore throat afterwards. She was told to purchase chloraseptic spray for her sore throat, but the patient did not, and her sore throat still lingers.   Patient's sore throat preceded her ear pain. Patient endorses bilateral ear pain with R worse than L. Patient states that the pain is constant and radiates "back and forth," from the affected ears. Patient states that she notices pain on pulling her ear. She denies sinusitis, ear fullness, ear discharge, hearing loss, hyperacusis, sinusitis, fevers, headaches, nausea, vomiting, or myalgias. Nothing aggravates the pain, nothing alleviates it.  Assessment:  Patient presents for a telehealth visit for ear pain. Her pain began shortly after a colonoscopy and was preceded by a sore throat. Topical ointments and ear drops have provided no relief. Patient states she is allergic to NSAIDS, and has not tried taking them for symptomatic relief. She endorses pain on pulling her ear, which may be a symptom of an outer ear infection, which may be bacterial in origin. Her pain was preceded by a sore throat, which is typical for viral etiology. Patient denies fevers, ear fullness, hearing changes, or tender lymphadenopathy, which makes distinguishing between viral or bacterial etiology difficult to ascertain. Ultimately, patient should follow up 04/29/2019 for her appointment for a thorough physical examination to determine if antibiotics are indicated, and pending type of infection, which antibiotics to utilize.   Plan:  - Patient to F/U for 04/29/2019 appointment for a physical examination and treatment, if indicated.

## 2019-04-27 NOTE — Telephone Encounter (Signed)
Pt decided she did not want to wait until 12/2 she would like an appt -telehealth appt today, states her ears are getting worse and just feels bad, appt [facility] TELEHEALTH 11/30 at 1445

## 2019-04-28 ENCOUNTER — Telehealth: Payer: Self-pay | Admitting: Gastroenterology

## 2019-04-28 NOTE — Telephone Encounter (Signed)
The pt called to say that she has ear drum pain.  She was advised to call her PCP with that complaint.  The pt has been advised of the information and verbalized understanding.

## 2019-04-29 ENCOUNTER — Ambulatory Visit: Payer: Medicare Other

## 2019-04-29 ENCOUNTER — Ambulatory Visit: Payer: Medicaid Other

## 2019-04-30 NOTE — Progress Notes (Signed)
Internal Medicine Clinic Attending  I spoke with and evaluated the patient.  I confirmed the key portions of the history and telephone conversation documented by Dr. Gilford Rile and I reviewed pertinent patient test results.  The assessment, diagnosis, and plan were formulated together and I agree with the documentation in the resident's note.

## 2019-05-06 ENCOUNTER — Encounter: Payer: Medicaid Other | Admitting: Internal Medicine

## 2019-05-12 ENCOUNTER — Telehealth: Payer: Self-pay | Admitting: Internal Medicine

## 2019-05-12 NOTE — Telephone Encounter (Signed)
Thank you. There is another iron supplement that we may be able to try although it can sometimes be more expensive. Hopefully she comes tomorrow.

## 2019-05-12 NOTE — Telephone Encounter (Signed)
Return pt's call - stated protonix and iron "is doing something to my body". Stated she's no longer an alcoholic - she wants to stay on protonix but cannot tolerate 150 mg of iron so wants to stop it. I asked what reaction she has from the iron; she just stated it makes me sick.  She has an appt tomorrow - stated she might come.  Thanks

## 2019-05-12 NOTE — Telephone Encounter (Signed)
Pt is requesting a nurse to callback regarding medicine' [phone]

## 2019-05-13 ENCOUNTER — Encounter: Payer: Medicare Other | Admitting: Internal Medicine

## 2019-05-20 ENCOUNTER — Telehealth: Payer: Self-pay | Admitting: Internal Medicine

## 2019-05-20 NOTE — Telephone Encounter (Signed)
Return pt's call - stated she's "itching all over" ; and she's no drinking alcohol. Suggest a topical cream which she can buy OTC. Stated she has an appt on Monday

## 2019-05-20 NOTE — Telephone Encounter (Signed)
Pt is requesting a call back [phone]

## 2019-05-20 NOTE — Telephone Encounter (Signed)
Agree with this plan.  Will see on Monday.  Seek urgent care if itching worsens or she develops other symptoms, broken skin, etc.

## 2019-05-22 ENCOUNTER — Emergency Department (HOSPITAL_COMMUNITY)
Admission: EM | Admit: 2019-05-22 | Discharge: 2019-05-22 | Disposition: A | Discharge status: Home / Self Care | Payer: Medicare Other | Attending: Emergency Medicine | Admitting: Emergency Medicine

## 2019-05-22 ENCOUNTER — Other Ambulatory Visit: Payer: Self-pay

## 2019-05-22 ENCOUNTER — Encounter (HOSPITAL_COMMUNITY): Payer: Self-pay | Admitting: Pharmacy Technician

## 2019-05-22 DIAGNOSIS — I10 Essential (primary) hypertension: Secondary | ICD-10-CM | POA: Insufficient documentation

## 2019-05-22 DIAGNOSIS — N3090 Cystitis, unspecified without hematuria: Secondary | ICD-10-CM | POA: Insufficient documentation

## 2019-05-22 DIAGNOSIS — Z87891 Personal history of nicotine dependence: Secondary | ICD-10-CM | POA: Insufficient documentation

## 2019-05-22 DIAGNOSIS — Z79899 Other long term (current) drug therapy: Secondary | ICD-10-CM | POA: Diagnosis not present

## 2019-05-22 DIAGNOSIS — N3000 Acute cystitis without hematuria: Secondary | ICD-10-CM

## 2019-05-22 DIAGNOSIS — J45909 Unspecified asthma, uncomplicated: Secondary | ICD-10-CM | POA: Insufficient documentation

## 2019-05-22 DIAGNOSIS — R300 Dysuria: Secondary | ICD-10-CM | POA: Diagnosis present

## 2019-05-22 LAB — URINALYSIS, ROUTINE W REFLEX MICROSCOPIC
Bilirubin Urine: NEGATIVE
Glucose, UA: NEGATIVE mg/dL
Hgb urine dipstick: NEGATIVE
Ketones, ur: NEGATIVE mg/dL
Nitrite: NEGATIVE
Protein, ur: NEGATIVE mg/dL
Specific Gravity, Urine: 1.015 (ref 1.005–1.030)
pH: 7 (ref 5.0–8.0)

## 2019-05-22 MED ORDER — NITROFURANTOIN MONOHYD MACRO 100 MG PO CAPS: 100.0000 mg | ORAL_CAPSULE | Freq: Two times a day (BID) | ORAL | 0 refills | Status: DC

## 2019-05-22 MED ORDER — PHENAZOPYRIDINE HCL 200 MG PO TABS: 200.0000 mg | ORAL_TABLET | Freq: Three times a day (TID) | ORAL | 0 refills | Status: DC

## 2019-05-22 NOTE — ED Provider Notes (Addendum)
[facility] EMERGENCY DEPARTMENT Provider Note   CSN: [id number] Arrival date & time: 05/22/19  1143     History Chief Complaint  Patient presents with  . Recurrent UTI    Kimberly [name] is a 63 y.o. female with no relevant past medical history presents to the ED with a 2-week history of dysuria and increased urinary urgency and frequency.  He has been trying to quell her symptoms with cranberry juice, with no relief.  She is not able to see her PCP, Dr. Sharon Seller, until 06/03/2019 which prompted her to come to the ED today for evaluation.  She denies any fevers, chills, back pain, hematuria, vaginal discharge, vaginal pain, chest pain or shortness of breath, abdominal pain, nausea or vomiting, or other symptoms.   HPI     Past Medical History:  Diagnosis Date  . Alcohol abuse, in remission    since around 2005  . Alcoholic hepatitis    fatty liver on imaging- Treated  . Allergic rhinitis   . Anemia    EGD with duodenal AVM, normal colonoscopy 04/2012  . Asthma   . Callus of foot 2009  . Chest pain, musculoskeletal 2011  . Cholelithiasis    [place] 09/2008: Cholelithiasis is present, s/p cholecystectomy  . Depression   . Excessive cerumen in ear canal    since before 2000  . GERD (gastroesophageal reflux disease)   . History of blood transfusion   . Hyperlipidemia   . Hypertension   . Menopausal syndrome    Treated with estradiol in the past - discontinued 04/2012  . MENOPAUSAL SYNDROME 09/24/2006   2008 Note : The pt is adament about having this medication.  She fully understands the increased risk of heart disease and cancer that is associated with HRT and is willing to accept this risk in order to prevent the debilitating hot flashes she has off this medication.  Will continue to encourage her to try to wean herself off of this medication at our next visit.  2009 note indicated that she tr  . Otitis externa, acute Feb 2012   bilateral, treated with  azithromycin after failure of neomycin drops  . Otitis media, acute Feb 2012  . PONV (postoperative nausea and vomiting)     Patient Active Problem List   Diagnosis Date Noted  . Ear pain, bilateral 04/27/2019  . 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 Vaginal discharge 01/18/2019  . Abdominal gas pain 12/16/2018  . Microcytic anemia 11/28/2018  . Pain in right thigh 01/15/2018  . Health care maintenance 03/25/2013  . Depression 12/03/2012  . Iron deficiency anemia 05/08/2012  . HLD (hyperlipidemia) 03/25/2008  . Essential hypertension 09/24/2006  . Allergies 09/24/2006  . Asthma 09/24/2006  . GERD 09/24/2006    Past Surgical History:  Procedure Laterality Date  . ABDOMINAL HYSTERECTOMY  1980s  . BIOPSY  03/18/2019   Procedure: BIOPSY;  Surgeon: Irving Copas., MD;  Location: [place];  Service: Gastroenterology;;  . Lorin Mercy  01/06/09  . COLONOSCOPY  05/09/2012   Procedure: COLONOSCOPY;  Surgeon: Inda Castle, MD;  Location: [place];  Service: Endoscopy;  Laterality: N/A;  . COLONOSCOPY WITH PROPOFOL N/A 03/18/2019   Procedure: COLONOSCOPY WITH PROPOFOL;  Surgeon: Rush Landmark Telford Nab., MD;  Location: [place];  Service: Gastroenterology;  Laterality: N/A;  . ENTEROSCOPY N/A 03/18/2019   Procedure: ENTEROSCOPY;  Surgeon: Rush Landmark Telford Nab., MD;  Location: [place];  Service: Gastroenterology;  Laterality: N/A;  .  ESOPHAGOGASTRODUODENOSCOPY  05/09/2012   Procedure: ESOPHAGOGASTRODUODENOSCOPY (EGD);  Surgeon: Inda Castle, MD;  Location: [place];  Service: Endoscopy;  Laterality: N/A;  . HEMOSTASIS CLIP PLACEMENT  03/18/2019   Procedure: HEMOSTASIS CLIP PLACEMENT;  Surgeon: Irving Copas., MD;  Location: [place];  Service: Gastroenterology;;  . HOT HEMOSTASIS N/A 03/18/2019   Procedure: HOT HEMOSTASIS (ARGON PLASMA COAGULATION/BICAP);  Surgeon: Irving Copas., MD;  Location:  [place];  Service: Gastroenterology;  Laterality: N/A;  . POLYPECTOMY  03/18/2019   Procedure: POLYPECTOMY;  Surgeon: Rush Landmark Telford Nab., MD;  Location: [place];  Service: Gastroenterology;;  . [place] INJECTION  03/18/2019   Procedure: SUBMUCOSAL TATTOO INJECTION;  Surgeon: Irving Copas., MD;  Location: [place];  Service: Gastroenterology;;     OB History   No obstetric history on file.     Family History  Problem Relation Age of Onset  . Hypertension Mother   . Colon cancer Neg Hx   . Esophageal cancer Neg Hx   . Inflammatory bowel disease Neg Hx   . Liver disease Neg Hx   . Pancreatic cancer Neg Hx   . Rectal cancer Neg Hx   . Stomach cancer Neg Hx     Social History   Tobacco Use  . Smoking status: Former Smoker    Types: Cigarettes    Quit date: 08/21/1995    Years since quitting: 23.7  . Smokeless tobacco: Never Used  Substance Use Topics  . Alcohol use: No    Comment: in remission since 2007  . Drug use: No    Home Medications Prior to Admission medications   Medication Sig Start Date End Date Taking? Authorizing Provider  amitriptyline (ELAVIL) 25 MG tablet Take 1 tablet (25 mg total) by mouth at bedtime. 01/06/19   Axel Filler, MD  FLOVENT HFA 110 MCG/ACT inhaler Inhale 2 puffs by mouth twice daily Patient taking differently: Inhale 2 puffs into the lungs 2 (two) times daily as needed (shortness of breath).  01/20/19   Seawell, Jaimie A, DO  hydrochlorothiazide (HYDRODIURIL) 25 MG tablet Take 1 tablet (25 mg total) by mouth daily. 12/02/18   Seawell, Jaimie A, DO  iron polysaccharides (NIFEREX) 150 MG capsule Take 150 mg by mouth daily.    [provider]  iron polysaccharides (NU-IRON) 150 MG capsule Take 1 capsule (150 mg total) by mouth daily. Patient not taking: Reported on 03/11/2019 12/15/18   Seawell, Andris Baumann A, DO  nitrofurantoin, macrocrystal-monohydrate, (MACROBID) 100 MG capsule Take 1 capsule  (100 mg total) by mouth 2 (two) times daily. 05/22/19   Corena Herter, PA-C  pantoprazole (PROTONIX) 40 MG tablet Take 1 tablet by mouth twice daily for 1 month, then decrease back to once daily thereafter. 03/25/19   Mansouraty, Telford Nab., MD  phenazopyridine (PYRIDIUM) 200 MG tablet Take 1 tablet (200 mg total) by mouth 3 (three) times daily. 05/22/19   Corena Herter, PA-C  pravastatin (PRAVACHOL) 40 MG tablet TAKE 1 TABLET BY MOUTH EVERY EVENING Patient taking differently: Take 40 mg by mouth every evening.  01/14/19   Seawell, Jaimie A, DO    Allergies    Banana, Grape (artificial) flavor, Ibuprofen, and Penicillins  Review of Systems   Review of Systems  Constitutional: Negative for chills and fever.  Gastrointestinal: Negative for abdominal pain.  Genitourinary: Positive for dysuria and frequency. Negative for difficulty urinating, flank pain, hematuria, vaginal bleeding and vaginal discharge.    Physical Exam Updated Vital Signs BP 131/65  Pulse 88   Temp 98.5 F (36.9 C) (Oral)   Resp 14   LMP 08/21/1982   SpO2 100%   Physical Exam Vitals and nursing note reviewed. Exam conducted with a chaperone present.  Constitutional:      Appearance: Normal appearance.  HENT:     Head: Normocephalic and atraumatic.  Eyes:     General: No scleral icterus.    Conjunctiva/sclera: Conjunctivae normal.  Cardiovascular:     Rate and Rhythm: Normal rate and regular rhythm.     Pulses: Normal pulses.     Heart sounds: Normal heart sounds.  Pulmonary:     Effort: Pulmonary effort is normal.     Breath sounds: Normal breath sounds.  Abdominal:     General: Abdomen is flat. There is no distension.     Palpations: Abdomen is soft.     Tenderness: There is no abdominal tenderness. There is no guarding.  Musculoskeletal:     Cervical back: Normal range of motion and neck supple.  Skin:    General: Skin is dry.  Neurological:     Mental Status: She is alert and oriented to  person, place, and time.     GCS: GCS eye subscore is 4. GCS verbal subscore is 5. GCS motor subscore is 6.  Psychiatric:        Mood and Affect: Mood normal.        Behavior: Behavior normal.        Thought Content: Thought content normal.     ED Results / Procedures / Treatments   Labs (all labs ordered are listed, but only abnormal results are displayed) Labs Reviewed  URINALYSIS, ROUTINE W REFLEX MICROSCOPIC - Abnormal; Notable for the following components:      Result Value   Leukocytes,Ua SMALL (*)    Bacteria, UA RARE (*)    All other components within normal limits  URINE CULTURE    EKG None  Radiology No results found.  Procedures Procedures (including critical care time)  Medications Ordered in ED Medications - No data to display  ED Course  I have reviewed the triage vital signs and the nursing notes.  Pertinent labs & imaging results that were available during my care of the patient were reviewed by me and considered in my medical decision making (see chart for details).    MDM Rules/Calculators/A&P                      While UA is not particularly impressive, demonstrates leukocytes and bacteria concerning for urinary tract infection was consistent with her history of worsening dysuria, urinary urgency, and increased urinary frequency.  She denies any fevers or chills, flank pain, or back pain concerning for pyelonephritis.  She only has mild suprapubic TTP suggestive of cystitis.  She denies any vaginal discharge or vaginal bleeding at this time.  Given her reported penicillin allergy, will treat with Macrobid for antibiotic coverage.  We will also provide her with Pyridium for symptomatic relief.  Patient voiced understanding is agreeable to plan.  She reports that she has use Macrobid in the past with success.  Will obtain urine culture and have her follow-up with her PCP regarding today's encounter for further evaluation management should her symptoms not  improve.  Patient is afebrile and hemodynamically stable.  All vital signs within normal limits. She is no acute distress and safe for discharge. Strict return precautions discussed.  Patient voiced understanding and is agreeable to plan.  Final Clinical Impression(s) / ED Diagnoses Final diagnoses:  Acute cystitis without hematuria    Rx / DC Orders ED Discharge Orders         Ordered    nitrofurantoin, macrocrystal-monohydrate, (MACROBID) 100 MG capsule  2 times daily     05/22/19 1248    phenazopyridine (PYRIDIUM) 200 MG tablet  3 times daily     05/22/19 1248           Reita Chard 05/22/19 1248    Corena Herter, PA-C 05/22/19 1253    Julianne Rice, MD 05/22/19 1357

## 2019-05-22 NOTE — ED Triage Notes (Signed)
Pt bib ems from home with dysuria X 1 month. Pt has tried drinking cranberry juice without relief.

## 2019-05-22 NOTE — ED Notes (Signed)
Patient verbalizes understanding of discharge instructions. Opportunity for questioning and answers were provided. Armband removed by staff, pt discharged from ED.  

## 2019-05-22 NOTE — Discharge Instructions (Addendum)
Please take your medications, as prescribed.  Please be sure to follow-up with your PCP, Dr. Sharon Seller, during today's encounter.  I have obtained a urine culture to aid in proper antibiotic selection in the event your symptoms fail to improve.   Please return to the ED or seek medical attention for any fevers or chills, flank pain, or any other new or worsening symptoms.

## 2019-05-23 ENCOUNTER — Telehealth: Payer: Self-pay | Admitting: Internal Medicine

## 2019-05-23 LAB — URINE CULTURE: Culture: 40000 — AB

## 2019-05-23 NOTE — Telephone Encounter (Signed)
   Reason for call:   I received a call from [name] at  PM indicating: she went to ED for urinary symptoms and was sent home with PO AB. She called to make sure everything is ok and if she should take the antibiotic. She is doing ok. I reviewed the note from ED provider yesterday and confirmed with patient that she has had UTI and to take the antibiotic as prescribed. She agrees and appreciates the call.   Pertinent Data:     Assessment / Plan / Recommendations:   No new plan. Recommended to take antibiotic as prescribed for her UTI  As always, pt is advised that if symptoms worsen or new symptoms arise, they should go to an urgent care facility or to to ER for further evaluation.   Dewayne Hatch, MD   05/23/2019, 2:17 PM

## 2019-05-25 ENCOUNTER — Encounter: Payer: Self-pay | Admitting: Internal Medicine

## 2019-05-25 ENCOUNTER — Ambulatory Visit (INDEPENDENT_AMBULATORY_CARE_PROVIDER_SITE_OTHER): Payer: Medicare Other | Admitting: Internal Medicine

## 2019-05-25 ENCOUNTER — Telehealth: Payer: Self-pay | Admitting: Emergency Medicine

## 2019-05-25 ENCOUNTER — Ambulatory Visit (HOSPITAL_COMMUNITY)
Admission: RE | Admit: 2019-05-25 | Discharge: 2019-05-25 | Disposition: A | Discharge status: Home / Self Care | Payer: Medicare Other | Source: Ambulatory Visit | Attending: Internal Medicine | Admitting: Internal Medicine

## 2019-05-25 VITALS — BP 143/83 | HR 124 | Temp 98.4°F | Ht 64.0 in | Wt 97.3 lb

## 2019-05-25 DIAGNOSIS — E785 Hyperlipidemia, unspecified: Secondary | ICD-10-CM | POA: Diagnosis not present

## 2019-05-25 DIAGNOSIS — R319 Hematuria, unspecified: Secondary | ICD-10-CM | POA: Insufficient documentation

## 2019-05-25 DIAGNOSIS — R000 Tachycardia, unspecified: Secondary | ICD-10-CM | POA: Insufficient documentation

## 2019-05-25 DIAGNOSIS — Z9049 Acquired absence of other specified parts of digestive tract: Secondary | ICD-10-CM | POA: Insufficient documentation

## 2019-05-25 DIAGNOSIS — R309 Painful micturition, unspecified: Secondary | ICD-10-CM | POA: Diagnosis present

## 2019-05-25 DIAGNOSIS — R300 Dysuria: Secondary | ICD-10-CM

## 2019-05-25 DIAGNOSIS — D508 Other iron deficiency anemias: Secondary | ICD-10-CM | POA: Diagnosis not present

## 2019-05-25 DIAGNOSIS — K219 Gastro-esophageal reflux disease without esophagitis: Secondary | ICD-10-CM | POA: Diagnosis not present

## 2019-05-25 DIAGNOSIS — J45909 Unspecified asthma, uncomplicated: Secondary | ICD-10-CM | POA: Insufficient documentation

## 2019-05-25 DIAGNOSIS — I10 Essential (primary) hypertension: Secondary | ICD-10-CM | POA: Diagnosis not present

## 2019-05-25 LAB — POCT URINALYSIS DIPSTICK
Glucose, UA: POSITIVE — AB
Nitrite, UA: POSITIVE
Protein, UA: POSITIVE — AB
Spec Grav, UA: 1.015 (ref 1.010–1.025)
Urobilinogen, UA: 4 E.U./dL — AB
pH, UA: 6 (ref 5.0–8.0)

## 2019-05-25 MED ORDER — FERROUS GLUCONATE 256 (28 FE) MG PO TABS: 256.0000 mg | ORAL_TABLET | Freq: Every day | ORAL | 2 refills | Status: DC

## 2019-05-25 NOTE — Assessment & Plan Note (Signed)
Patient's POCT urinalysis revealed trace hemoglobin.  Her urine was discolored with a red tint.  Although this could be secondary to her azo dye will further evaluate with complete UA and follow-up from there.

## 2019-05-25 NOTE — Progress Notes (Signed)
   CC: Pain and burning with urination  HPI:Kimberly [name] is a 63 y.o. female who presents for evaluation of pain and burning with urination after visiting the ER on the 25th. Please see individual problem based A/P for details.  Past Medical History:  Diagnosis Date  . Alcohol abuse, in remission    since around 2005  . Alcoholic hepatitis    fatty liver on imaging- Treated  . Allergic rhinitis   . Anemia    EGD with duodenal AVM, normal colonoscopy 04/2012  . Asthma   . Callus of foot 2009  . Chest pain, musculoskeletal 2011  . Cholelithiasis    [place] 09/2008: Cholelithiasis is present, s/p cholecystectomy  . Depression   . Excessive cerumen in ear canal    since before 2000  . GERD (gastroesophageal reflux disease)   . History of blood transfusion   . Hyperlipidemia   . Hypertension   . Menopausal syndrome    Treated with estradiol in the past - discontinued 04/2012  . MENOPAUSAL SYNDROME 09/24/2006   2008 Note : The pt is adament about having this medication.  She fully understands the increased risk of heart disease and cancer that is associated with HRT and is willing to accept this risk in order to prevent the debilitating hot flashes she has off this medication.  Will continue to encourage her to try to wean herself off of this medication at our next visit.  2009 note indicated that she tr  . Otitis externa, acute Feb 2012   bilateral, treated with azithromycin after failure of neomycin drops  . Otitis media, acute Feb 2012  . PONV (postoperative nausea and vomiting)    Review of Systems:   ROS negative except as per HPI.  Physical Exam: Vitals:   05/25/19 0844  BP: (!) 143/83  Pulse: (!) 124  Temp: 98.4 F (36.9 C)  TempSrc: Oral  SpO2: 99%  Weight: 97 lb 4.8 oz (44.1 kg)  Height: 5\' 4"  (1.626 m)   General: A/O x4, in no acute distress, afebrile, nondiaphoretic HEENT: PEERL, EMO intact Cardio: Increased rate, no mrg's  Pulmonary: CTA bilaterally, no  wheezing or crackles  Abdomen: Bowel sounds normal, soft, nontender  MSK: BLE nontender, nonedematous Psych: Appropriate affect, not depressed in appearance, engages well  Assessment & Plan:   See Encounters Tab for problem based charting.  Patient discussed with Dr. Evette Doffing

## 2019-05-25 NOTE — Patient Instructions (Signed)
FOLLOW-UP INSTRUCTIONS When: 4-6 wks What to bring: All of your medications  Thank you for your visit to the [facility] Internal medicine clinic today.  For your rapid heart rate we have obtained an EKG which does not reveal a bad rhythm.  We will look into a rapid heart rate little more intensely with some lab work and let you know if there are any major concerns.  Please notify us if you develop chest pain, fever, chills, cough, blood in your urine or stool or other concerning symptoms.  Thank you for your visit to the [facility] today. If you have any questions or concerns please call us at [phone].

## 2019-05-25 NOTE — Progress Notes (Signed)
Internal Medicine Clinic Attending  Case discussed with Dr. Harbrecht at the time of the visit.  We reviewed the resident's history and exam and pertinent patient test results.  I agree with the assessment, diagnosis, and plan of care documented in the resident's note.   

## 2019-05-25 NOTE — Telephone Encounter (Signed)
Post ED Visit - Positive Culture Follow-up  Culture report reviewed by antimicrobial stewardship pharmacist: [place] Team []  Elenor Quinones, Pharm.D. []  Heide Guile, Pharm.D., BCPS AQ-ID []  Parks Neptune, Pharm.D., BCPS []  Alycia Rossetti, Pharm.D., BCPS []  Vandiver, Pharm.D., BCPS, AAHIVP []  Legrand Como, Pharm.D., BCPS, AAHIVP []  Salome Arnt, PharmD, BCPS []  Johnnette Gourd, PharmD, BCPS []  Hughes Better, PharmD, BCPS []  Leeroy Cha, PharmD []  Laqueta Linden, PharmD, BCPS []  Albertina Parr, PharmD  [place] Team []  Leodis Sias, PharmD []  Lindell Spar, PharmD []  Royetta Asal, PharmD []  Graylin Shiver, Rph []  Rema Fendt) Glennon Mac, PharmD []  Arlyn Dunning, PharmD []  Netta Cedars, PharmD []  Dia Sitter, PharmD []  Leone Haven, PharmD []  Gretta Arab, PharmD [x]  Theodis Shove, PharmD []  Peggyann Juba, PharmD []  Reuel Boom, PharmD   Positive urine culture Treated with nitrofurantoin, organism sensitive to the same and no further patient follow-up is required at this time.  Hazle Nordmann 05/25/2019, 11:37 AM

## 2019-05-25 NOTE — Assessment & Plan Note (Signed)
Today the patient endorses persistent gastrointestinal upset and constipation with her iron supplementation.  We will attempt to transition today to ferrous gluconate as she has failed clears sulfate and iron polysaccharide.  Prescription for ferrous gluconate sent to patient's preferred pharmacy

## 2019-05-25 NOTE — Assessment & Plan Note (Signed)
Tachycardia: Patient presented with asymptomatic tachycardia.  She denies chest pain, dizziness, dizziness with standing, fever, chills, pain endorses marked anxiety. Exam was unremarkable except for the increased heart rate.  Etiology uncertain but I doubt it is related to cystitis.  She has history of anemia which we will evaluate further.  Plan:  EKG was performed which revealed sinus tachycardia and nonspecific ST depressions in leads II, III and aVF.  She has absent other signs of ACS. CMP and CBC w/ diff will be ordered for evaluation of renal insult and baseline anemia

## 2019-05-25 NOTE — Assessment & Plan Note (Signed)
UTI: Diagnosed with a UTI on December 25th after experiencing several days of pain and burning with urination. She stated that she visited the ER due to the severity of her symptoms but denied symptoms today including fever, dysuria, hematuria, chills, cough, flank pain or myalgias. UA from that day demonstrated only rare bacteria, small leukocytes and was negative for Hgb. Urine culture was positive for 40,000 CFU's of diphtheroids.  Patient was given Macrobid 100mg 's twice daily for 5 days which she began taking on 25th in addition to Pyridium at 200 mg 3 times daily for 3 days which is now complete. Patient's urine is a dark red appearance.  Given the trace hemoglobin on POCT we will order a full UA but do not feel she has an active UTI at this time.  Plan: POCT urinalysis revealed trace hemoglobin, leukocyte and nitrite positive UA with reflex microscopy

## 2019-05-26 LAB — CBC WITH DIFFERENTIAL/PLATELET
Basophils Absolute: 0.1 10*3/uL (ref 0.0–0.2)
Basos: 1 %
EOS (ABSOLUTE): 0.1 10*3/uL (ref 0.0–0.4)
Eos: 1 %
Hematocrit: 44.1 % (ref 34.0–46.6)
Hemoglobin: 14.8 g/dL (ref 11.1–15.9)
Immature Grans (Abs): 0 10*3/uL (ref 0.0–0.1)
Immature Granulocytes: 0 %
Lymphocytes Absolute: 1.4 10*3/uL (ref 0.7–3.1)
Lymphs: 21 %
MCH: 30.9 pg (ref 26.6–33.0)
MCHC: 33.6 g/dL (ref 31.5–35.7)
MCV: 92 fL (ref 79–97)
Monocytes Absolute: 0.6 10*3/uL (ref 0.1–0.9)
Monocytes: 10 %
Neutrophils Absolute: 4.5 10*3/uL (ref 1.4–7.0)
Neutrophils: 67 %
Platelets: 333 10*3/uL (ref 150–450)
RBC: 4.79 x10E6/uL (ref 3.77–5.28)
RDW: 11.5 % — ABNORMAL LOW (ref 11.7–15.4)
WBC: 6.8 10*3/uL (ref 3.4–10.8)

## 2019-05-26 LAB — URINALYSIS, ROUTINE W REFLEX MICROSCOPIC
Bilirubin, UA: NEGATIVE
Glucose, UA: NEGATIVE
Nitrite, UA: POSITIVE — AB
RBC, UA: NEGATIVE
Specific Gravity, UA: 1.016 (ref 1.005–1.030)
Urobilinogen, Ur: 1 mg/dL (ref 0.2–1.0)
pH, UA: 6.5 (ref 5.0–7.5)

## 2019-05-26 LAB — CMP14 + ANION GAP
ALT: 5 IU/L (ref 0–32)
AST: 22 IU/L (ref 0–40)
Albumin/Globulin Ratio: 1.7 (ref 1.2–2.2)
Albumin: 4.9 g/dL — ABNORMAL HIGH (ref 3.8–4.8)
Alkaline Phosphatase: 105 IU/L (ref 39–117)
Anion Gap: 23 mmol/L — ABNORMAL HIGH (ref 10.0–18.0)
BUN/Creatinine Ratio: 11 — ABNORMAL LOW (ref 12–28)
BUN: 11 mg/dL (ref 8–27)
Bilirubin Total: 3.4 mg/dL — ABNORMAL HIGH (ref 0.0–1.2)
CO2: 23 mmol/L (ref 20–29)
Calcium: 10.2 mg/dL (ref 8.7–10.3)
Chloride: 90 mmol/L — ABNORMAL LOW (ref 96–106)
Creatinine, Ser: 0.96 mg/dL (ref 0.57–1.00)
GFR calc Af Amer: 72 mL/min/{1.73_m2} (ref >59)
GFR calc non Af Amer: 62 mL/min/{1.73_m2} (ref >59)
Globulin, Total: 2.9 g/dL (ref 1.5–4.5)
Glucose: 84 mg/dL (ref 65–99)
Potassium: 3.5 mmol/L (ref 3.5–5.2)
Sodium: 136 mmol/L (ref 134–144)
Total Protein: 7.8 g/dL (ref 6.0–8.5)

## 2019-05-26 LAB — MICROSCOPIC EXAMINATION

## 2019-05-27 ENCOUNTER — Telehealth: Payer: Self-pay | Admitting: Internal Medicine

## 2019-05-27 NOTE — Telephone Encounter (Signed)
I called and notified the patient of her lab results. Of concern is her elevated bilirubin to 3.4. She stated that she is feeling well overall and denied fever, chills, abdominal pain, nausea or vomiting.  Patient states that she has had a cholecystectomy in the past, denies any symptoms other than mild acid reflux. Given her lack of other defining features for systemic infection, absence of symptoms as well as her lack of leukocytosis and stable platelet count at 333 I have low suspicion for sepsis.  Etiology for hyperbilirubinemia is uncertain but possibly related to her recent acute illness.  Patients hemoglobin is 14.8.  She denies rapid heartbeat at this time as she experienced on her prior visit which was confirmed with EKG.  Over read by Dr. Aundra Dubin cardiology recommended considering inferior ischemia.  Patient continues to deny chest pain, palpitations or shortness of breath. Patient was given strict return precautions including regarding chest pain, fever or other concerning symptoms. I advised her to keep her appointment with January 6 with her PCP for repeat lab work with hepatic panel to determine if the hyperbilirubinemia is persistent and if so is it conjugated or unconjugated to better aid in diagnosis.

## 2019-05-27 NOTE — Telephone Encounter (Signed)
Returned call and answered questions 

## 2019-05-27 NOTE — Telephone Encounter (Signed)
Pt is wanted physician to call her back she has another question [phone]

## 2019-06-01 ENCOUNTER — Telehealth: Payer: Self-pay | Admitting: Gastroenterology

## 2019-06-01 NOTE — Telephone Encounter (Signed)
error 

## 2019-06-02 ENCOUNTER — Encounter: Payer: Self-pay | Admitting: Internal Medicine

## 2019-06-02 ENCOUNTER — Telehealth: Payer: Self-pay | Admitting: Internal Medicine

## 2019-06-02 ENCOUNTER — Ambulatory Visit (INDEPENDENT_AMBULATORY_CARE_PROVIDER_SITE_OTHER): Payer: Medicare Other | Admitting: Internal Medicine

## 2019-06-02 ENCOUNTER — Ambulatory Visit: Payer: Medicaid Other | Admitting: Gastroenterology

## 2019-06-02 VITALS — BP 134/68 | HR 90 | Wt 105.1 lb

## 2019-06-02 DIAGNOSIS — Z87891 Personal history of nicotine dependence: Secondary | ICD-10-CM

## 2019-06-02 DIAGNOSIS — K5900 Constipation, unspecified: Secondary | ICD-10-CM

## 2019-06-02 DIAGNOSIS — K5903 Drug induced constipation: Secondary | ICD-10-CM

## 2019-06-02 DIAGNOSIS — R109 Unspecified abdominal pain: Secondary | ICD-10-CM

## 2019-06-02 DIAGNOSIS — R1030 Lower abdominal pain, unspecified: Secondary | ICD-10-CM

## 2019-06-02 DIAGNOSIS — R3915 Urgency of urination: Secondary | ICD-10-CM | POA: Diagnosis not present

## 2019-06-02 LAB — POCT URINALYSIS DIPSTICK
Bilirubin, UA: NEGATIVE
Blood, UA: NEGATIVE
Glucose, UA: NEGATIVE
Ketones, UA: NEGATIVE
Nitrite, UA: NEGATIVE
Protein, UA: NEGATIVE
Spec Grav, UA: 1.02 (ref 1.010–1.025)
Urobilinogen, UA: 0.2 E.U./dL
pH, UA: >=9 — AB (ref 5.0–8.0)

## 2019-06-02 MED ORDER — BISACODYL 5 MG PO TBEC: 5.0000 mg | DELAYED_RELEASE_TABLET | Freq: Every day | ORAL | 0 refills | Status: AC | PRN

## 2019-06-02 NOTE — Assessment & Plan Note (Addendum)
Patient presents with a 1 month history of bilateral lower abdominal pain that she describes as sharp and slowely progressed over this time. The pain is intermittent and waxes and wanes throughout the day. She doesn't think that having a BM changes her symptoms, but admits to constipations, consisting of a BM every 2-3 days. She denies nausea, vomiting, diarrhea, melena, hematemesis, dysuria, hematuria. She endorses dyspepsia/bloating with reflux, and urinary frequency. She had an EGD/colonoscopy in 02/2019 which show some polyps but otherwise normal. She had her gall bladder removed in 2010 and a hysterectomy in 1980's. She has a history of ETOH use disorder, but has been sober for 15 years. Differential diagnosis includes constipation, functional dyspepsia, medication side effect.  Plan: - Considering she has tried osmotic laxatives in the past w/o success, start bowel regimen with stimulant laxative, Bisacodyl.  - I counseled the patient on drinking enough water and incorporating more fiber into her diet. I recommended OTC fiber supplements (Benefiber, metamucil, etc.) - I will follow up with the patient to make sure she is having at least 1 BM a day and her symptoms have resolved.

## 2019-06-02 NOTE — Telephone Encounter (Signed)
rtc to pt, states she is in a great deal of pain, in her side, back and lower abd. States she thinks she has a kidney stone and ask if she could be seen today rather than tomorrow. appt 1415 today, pt will call back to confirm she has transportation

## 2019-06-02 NOTE — Telephone Encounter (Signed)
Pt is requesting a callback [phone]

## 2019-06-02 NOTE — Patient Instructions (Addendum)
Thank you, Ms.Mayan A Orourke for allowing [place] to provide your care today. Today we discussed Constipation.    I have ordered none labs for you. I will call if any are abnormal.    I have place a referrals to none.   I have ordered the following tests: none  I have ordered the following medication/changed the following medications: Bisacodyl 5 mg daily with a goal to have 1 bowel movement each day.  Instructions: - Take Bisacodyl 5 mg daily - Get fiber supplements at [facility] or whatever generic brand is cheaper - try to drink about 64 ounces or 2 liters of water daily.   Please follow-up in 4 weeks. We will repeat labs then.   Should you have any questions or concerns please call the internal medicine clinic at [phone].    [name], D.O. [facility]

## 2019-06-02 NOTE — Progress Notes (Signed)
   CC: Constipation   HPI:  [name] is a 64 y.o. female with a past medical history stated below and presents today for abdominal pain. Please see problem based assessment and plan for additional details.   Past Medical History:  Diagnosis Date  . Alcohol abuse, in remission    since around 2005  . Alcoholic hepatitis    fatty liver on imaging- Treated  . Allergic rhinitis   . Anemia    EGD with duodenal AVM, normal colonoscopy 04/2012  . Asthma   . Callus of foot 2009  . Chest pain, musculoskeletal 2011  . Cholelithiasis    [place] 09/2008: Cholelithiasis is present, s/p cholecystectomy  . Depression   . Excessive cerumen in ear canal    since before 2000  . GERD (gastroesophageal reflux disease)   . History of blood transfusion   . Hyperlipidemia   . Hypertension   . Menopausal syndrome    Treated with estradiol in the past - discontinued 04/2012  . MENOPAUSAL SYNDROME 09/24/2006   2008 Note : The pt is adament about having this medication.  She fully understands the increased risk of heart disease and cancer that is associated with HRT and is willing to accept this risk in order to prevent the debilitating hot flashes she has off this medication.  Will continue to encourage her to try to wean herself off of this medication at our next visit.  2009 note indicated that she tr  . Otitis externa, acute Feb 2012   bilateral, treated with azithromycin after failure of neomycin drops  . Otitis media, acute Feb 2012  . PONV (postoperative nausea and vomiting)       Review of Systems: Review of Systems  Constitutional: Negative for chills, fever and malaise/fatigue.  Respiratory: Negative for cough and shortness of breath.   Cardiovascular: Negative for chest pain, orthopnea and leg swelling.  Gastrointestinal: Positive for abdominal pain and constipation. Negative for blood in stool, diarrhea, heartburn, melena, nausea and vomiting.  Genitourinary: Positive for urgency.  Negative for dysuria and frequency.     There were no vitals filed for this visit.   Physical Exam: Physical Exam  Constitutional: She is oriented to person, place, and time and well-developed, well-nourished, and in no distress.  HENT:  Head: Normocephalic and atraumatic.  Eyes: EOM are normal.  Cardiovascular: Normal rate, regular rhythm, normal heart sounds and intact distal pulses. Exam reveals no gallop and no friction rub.  No murmur heard. Pulmonary/Chest: Effort normal and breath sounds normal.  Abdominal: Soft. Bowel sounds are normal. She exhibits no distension. There is no abdominal tenderness.  Musculoskeletal:        General: No tenderness or edema. Normal range of motion.     Cervical back: Normal range of motion.  Neurological: She is alert and oriented to person, place, and time.  Skin: Skin is dry.     Assessment & Plan:   See Encounters Tab for problem based charting.  Patient discussed with Dr. Philipp Ovens

## 2019-06-03 ENCOUNTER — Encounter: Payer: Medicaid Other | Admitting: Internal Medicine

## 2019-06-04 NOTE — Progress Notes (Signed)
Internal Medicine Clinic Attending  Case discussed with Dr. [name] at the time of the visit.  We reviewed the resident's history and exam and pertinent patient test results.  I agree with the assessment, diagnosis, and plan of care documented in the resident's note.   Although patient reports trying osmotic laxatives in the past without relief; I would recommend patient try miralax daily or twice daily regularly for a few weeks. Using intermittently PRN was likely insufficient for her.

## 2019-06-08 ENCOUNTER — Telehealth: Payer: Self-pay | Admitting: Gastroenterology

## 2019-06-08 NOTE — Telephone Encounter (Signed)
Patient is calling- she is still having acid reflux- and is asking if she still should be having it after the procedure Dr. Rush Landmark did last year.

## 2019-06-08 NOTE — Telephone Encounter (Signed)
The pt is taking Protonix twice daily but continues to have reflux.  We discussed anti reflux precautions and she tells me that she has been having a lot of caffeine, and chocolate.  She is going to try to limit these things and call call back if those changes have not helped.

## 2019-06-15 ENCOUNTER — Telehealth: Payer: Self-pay | Admitting: Gastroenterology

## 2019-06-15 NOTE — Telephone Encounter (Signed)
The pt is calling with complaints of persistant reflux despite medication and anti reflux precautions.  She also complains of constipation despite miralax daily.  Appt was made for her to see Dr Rush Landmark on 2/11.  The pt has been advised of the information and verbalized understanding.

## 2019-06-17 ENCOUNTER — Encounter: Payer: Self-pay | Admitting: Internal Medicine

## 2019-06-17 ENCOUNTER — Other Ambulatory Visit: Payer: Self-pay

## 2019-06-17 ENCOUNTER — Ambulatory Visit (INDEPENDENT_AMBULATORY_CARE_PROVIDER_SITE_OTHER): Payer: Medicare Other | Admitting: Internal Medicine

## 2019-06-17 DIAGNOSIS — Z87891 Personal history of nicotine dependence: Secondary | ICD-10-CM | POA: Diagnosis not present

## 2019-06-17 DIAGNOSIS — Z79899 Other long term (current) drug therapy: Secondary | ICD-10-CM

## 2019-06-17 DIAGNOSIS — I10 Essential (primary) hypertension: Secondary | ICD-10-CM

## 2019-06-17 DIAGNOSIS — R141 Gas pain: Secondary | ICD-10-CM | POA: Diagnosis not present

## 2019-06-17 DIAGNOSIS — K5900 Constipation, unspecified: Secondary | ICD-10-CM

## 2019-06-17 DIAGNOSIS — Z23 Encounter for immunization: Secondary | ICD-10-CM

## 2019-06-17 DIAGNOSIS — R109 Unspecified abdominal pain: Secondary | ICD-10-CM

## 2019-06-17 DIAGNOSIS — Z1231 Encounter for screening mammogram for malignant neoplasm of breast: Secondary | ICD-10-CM

## 2019-06-17 DIAGNOSIS — Z9049 Acquired absence of other specified parts of digestive tract: Secondary | ICD-10-CM

## 2019-06-17 DIAGNOSIS — G8929 Other chronic pain: Secondary | ICD-10-CM | POA: Diagnosis not present

## 2019-06-17 DIAGNOSIS — E785 Hyperlipidemia, unspecified: Secondary | ICD-10-CM

## 2019-06-17 DIAGNOSIS — K31819 Angiodysplasia of stomach and duodenum without bleeding: Secondary | ICD-10-CM

## 2019-06-17 MED ORDER — PRAVASTATIN SODIUM 40 MG PO TABS: 40.0000 mg | ORAL_TABLET | Freq: Every evening | ORAL | 0 refills | Status: DC

## 2019-06-17 NOTE — Assessment & Plan Note (Signed)
Previously seen for constipation earlier this month and was prescribed bisocodyl 5mg  qd, fiber supplementation and increased water intake. She states she has had chronic abdominal pain since cholecystectomy in 2010. This somewhat improved with transition to ferrous gluconate (nu-iron) in December for pain 2/2 iron supplementation but is still occurring. Pain does not change with po intake. She is no longer having constipation and BM appear normal. She denies nausea but does have decreased appetite. Previously colonoscopy and EGD 02/2019 showed polyps, non-bleeding AVM, and mild gastropathy. She is still taking protonix 40 mg bid.  She did have slightly increased bilirubin in December. Will recheck this today. Differential otherwise includes functional dyspepsia and IBS.  - repeat CMP today - continue bisocodyl, fiber, increased water intake  - f/u with gastroenterology 2/11

## 2019-06-17 NOTE — Assessment & Plan Note (Signed)
BP Readings from Last 3 Encounters:  06/17/19 127/84  06/02/19 134/68  05/25/19 (!) 143/83  Blood pressure controlled on current medications, HCTZ 25 mg qd, k-dur 20 mEq.   - continue current medications

## 2019-06-17 NOTE — Patient Instructions (Signed)
Thank you for allowing [place] to provide your care today. Today we discussed your ongoing abdominal pain.    I have ordered the following labs for you:  Complete metabolic panel    I will call if any are abnormal.    Please continue to take your medications for constipation.  Please follow-up if symptoms worsen or fail to improve.    Please call the internal medicine center clinic if you have any questions or concerns, we may be able to help and keep you from a long and expensive emergency room wait. Our clinic and after hours phone number is [phone], the best time to call is Monday through Friday 9 am to 4 pm but there is always someone available 24/7 if you have an emergency. If you need medication refills please notify your pharmacy one week in advance and they will send [place] a request.

## 2019-06-17 NOTE — Progress Notes (Signed)
   CC: abdominal pain  HPI:  [name] is a 64 y.o. with PMH as below.   Please see A&P for assessment of the patient's acute and chronic medical conditions.   Previously seen for constipation earlier this month and was prescribed bisocodyl 5mg  qd, fiber supplementation and increased water intake. She states she has had chronic abdominal pain since cholecystectomy in 2010. This somewhat improved with transition to ferrous gluconate (nu-iron) in December for pain 2/2 iron supplementation but is still occurring. Pain does not change with po intake. She is no longer having constipation and BM appear normal. She denies nausea but does have decreased appetite. Previously colonoscopy and EGD 02/2019 showed polyps, non-bleeding AVM, and mild gastropathy. She is still taking protonix 40 mg bid.    Past Medical History:  Diagnosis Date  . Alcohol abuse, in remission    since around 2005  . Alcoholic hepatitis    fatty liver on imaging- Treated  . Allergic rhinitis   . Anemia    EGD with duodenal AVM, normal colonoscopy 04/2012  . Asthma   . Callus of foot 2009  . Chest pain, musculoskeletal 2011  . Cholelithiasis    [place] 09/2008: Cholelithiasis is present, s/p cholecystectomy  . Depression   . Excessive cerumen in ear canal    since before 2000  . GERD (gastroesophageal reflux disease)   . History of blood transfusion   . Hyperlipidemia   . Hypertension   . Menopausal syndrome    Treated with estradiol in the past - discontinued 04/2012  . MENOPAUSAL SYNDROME 09/24/2006   2008 Note : The pt is adament about having this medication.  She fully understands the increased risk of heart disease and cancer that is associated with HRT and is willing to accept this risk in order to prevent the debilitating hot flashes she has off this medication.  Will continue to encourage her to try to wean herself off of this medication at our next visit.  2009 note indicated that she tr  . Otitis externa,  acute Feb 2012   bilateral, treated with azithromycin after failure of neomycin drops  . Otitis media, acute Feb 2012  . PONV (postoperative nausea and vomiting)    Review of Systems:   10 point ROS negative except as noted in HPI  Physical Exam: Constitution: NAD, appears stated age Cardio: RRR, no m/r/g, no LE edema  Respiratory: CTA, no w/r/r Abdominal: NTTP, soft, non-distended, normal bowel sounds Neuro: normal affect, a&ox3  Vitals:   06/17/19 1515  BP: 127/84  Pulse: 80  Temp: 97.6 F (36.4 C)  TempSrc: Oral  SpO2: 100%  Weight: 101 lb 9.6 oz (46.1 kg)  Height: 5\' 4"  (1.626 m)    Assessment & Plan:   See Encounters Tab for problem based charting.  Patient discussed with Dr. Philipp Ovens

## 2019-06-18 ENCOUNTER — Telehealth: Payer: Self-pay | Admitting: Internal Medicine

## 2019-06-18 LAB — CMP14 + ANION GAP
ALT: 8 IU/L (ref 0–32)
AST: 15 IU/L (ref 0–40)
Albumin/Globulin Ratio: 1.8 (ref 1.2–2.2)
Albumin: 4.2 g/dL (ref 3.8–4.8)
Alkaline Phosphatase: 85 IU/L (ref 39–117)
Anion Gap: 13 mmol/L (ref 10.0–18.0)
BUN/Creatinine Ratio: 11 — ABNORMAL LOW (ref 12–28)
BUN: 8 mg/dL (ref 8–27)
Bilirubin Total: 0.5 mg/dL (ref 0.0–1.2)
CO2: 27 mmol/L (ref 20–29)
Calcium: 9.5 mg/dL (ref 8.7–10.3)
Chloride: 98 mmol/L (ref 96–106)
Creatinine, Ser: 0.76 mg/dL (ref 0.57–1.00)
GFR calc Af Amer: 95 mL/min/{1.73_m2} (ref >59)
GFR calc non Af Amer: 83 mL/min/{1.73_m2} (ref >59)
Globulin, Total: 2.3 g/dL (ref 1.5–4.5)
Glucose: 80 mg/dL (ref 65–99)
Potassium: 3.3 mmol/L — ABNORMAL LOW (ref 3.5–5.2)
Sodium: 138 mmol/L (ref 134–144)
Total Protein: 6.5 g/dL (ref 6.0–8.5)

## 2019-06-18 LAB — BILIRUBIN, DIRECT: Bilirubin, Direct: 0.12 mg/dL (ref 0.00–0.40)

## 2019-06-18 NOTE — Telephone Encounter (Signed)
Her labs were normal, and she does not need an ultrasound. I called her to let her know. Thanks!

## 2019-06-18 NOTE — Telephone Encounter (Signed)
Pt is wanting to schedule ultrasound; pls contact [phone]

## 2019-06-18 NOTE — Telephone Encounter (Addendum)
I do not see an order for an U/S. Called pt - stated she talked to the doctor yesterday;she wants U/S done before she sees GI on 07/09/19. Informed once we get the order, it needs to be prior authorization then scheduled and someone will call her.

## 2019-06-21 ENCOUNTER — Other Ambulatory Visit: Payer: Self-pay | Admitting: Gastroenterology

## 2019-06-21 DIAGNOSIS — K219 Gastro-esophageal reflux disease without esophagitis: Secondary | ICD-10-CM

## 2019-06-24 ENCOUNTER — Other Ambulatory Visit: Payer: Self-pay | Admitting: Student in an Organized Health Care Education/Training Program

## 2019-06-24 DIAGNOSIS — F32A Depression, unspecified: Secondary | ICD-10-CM

## 2019-06-24 DIAGNOSIS — F329 Major depressive disorder, single episode, unspecified: Secondary | ICD-10-CM

## 2019-06-24 NOTE — Telephone Encounter (Signed)
Needs refill on amitriptyline (ELAVIL) 25 MG tablet  ;pt contact [facility] ([place]), [place] - [address]

## 2019-06-25 NOTE — Progress Notes (Signed)
Internal Medicine Clinic Attending  Case discussed with Dr. Seawell at the time of the visit.  We reviewed the resident's history and exam and pertinent patient test results.  I agree with the assessment, diagnosis, and plan of care documented in the resident's note.    

## 2019-07-01 ENCOUNTER — Other Ambulatory Visit: Payer: Self-pay | Admitting: Internal Medicine

## 2019-07-01 ENCOUNTER — Encounter: Payer: Medicaid Other | Admitting: Internal Medicine

## 2019-07-01 DIAGNOSIS — J4520 Mild intermittent asthma, uncomplicated: Secondary | ICD-10-CM

## 2019-07-01 NOTE — Telephone Encounter (Signed)
She got a single inhaler in Aug and is now requesting RF so likely using PRN. Refilling but [place] PCP

## 2019-07-04 ENCOUNTER — Telehealth: Payer: Self-pay | Admitting: Internal Medicine

## 2019-07-04 NOTE — Telephone Encounter (Signed)
Duplicate telephone encounter opened

## 2019-07-04 NOTE — Telephone Encounter (Addendum)
   Reason for call:   I received a call from Ms. Cherika A Ribble at 7:29 AM indicating that she is having diarrhea.   Pertinent Data:   Patient has been having one week history of non-bloody diarrhea that started as two watery stools per day and is now once per day.  Accompanied nausea, sweating, dizziness, fevers, decreased appetite and she has been forcing herself to eat  She denies vomiting, chills, chest pain, dyspnea, abdominal pain  She takes bisadoyl 3 times weekly and last took it Thursday   Patient stated that she has been has lactose intolerance but she has continued to eat cheese.   Assessment / Plan / Recommendations:   Advised patient to stop taking bisacodyl and stop eating diary products. Told her to follow up in clinic next week if her symptoms do not resolve.   As always, pt is advised that if symptoms worsen or new symptoms arise, they should go to an urgent care facility or to to ER for further evaluation.   Lars Mage, MD   07/04/2019, 7:28 AM

## 2019-07-09 ENCOUNTER — Ambulatory Visit: Payer: Medicaid Other | Admitting: Gastroenterology

## 2019-07-09 ENCOUNTER — Ambulatory Visit: Payer: Medicaid Other

## 2019-07-19 ENCOUNTER — Other Ambulatory Visit: Payer: Self-pay | Admitting: Gastroenterology

## 2019-07-19 DIAGNOSIS — K219 Gastro-esophageal reflux disease without esophagitis: Secondary | ICD-10-CM

## 2019-07-28 ENCOUNTER — Ambulatory Visit: Payer: Medicaid Other

## 2019-08-04 ENCOUNTER — Encounter: Payer: Self-pay | Admitting: Internal Medicine

## 2019-08-04 ENCOUNTER — Ambulatory Visit (INDEPENDENT_AMBULATORY_CARE_PROVIDER_SITE_OTHER): Payer: Medicare Other | Admitting: Internal Medicine

## 2019-08-04 ENCOUNTER — Other Ambulatory Visit: Payer: Self-pay

## 2019-08-04 DIAGNOSIS — M545 Low back pain, unspecified: Secondary | ICD-10-CM | POA: Insufficient documentation

## 2019-08-04 MED ORDER — BACLOFEN 10 MG PO TABS: 10.0000 mg | ORAL_TABLET | Freq: Two times a day (BID) | ORAL | 1 refills | Status: DC

## 2019-08-04 NOTE — Progress Notes (Signed)
Internal Medicine Clinic Attending  Case discussed with Dr. Harbrecht at the time of the visit.  We reviewed the resident's history and exam and pertinent patient test results.  I agree with the assessment, diagnosis, and plan of care documented in the resident's note.   

## 2019-08-04 NOTE — Progress Notes (Signed)
  [facility] Telephone Encounter Continuity Care Appointment  HPI:   This telephone encounter was created for [name] on 08/04/2019 for the following purpose/cc back pain.   Past Medical History:  Past Medical History:  Diagnosis Date  . Alcohol abuse, in remission    since around 2005  . Alcoholic hepatitis    fatty liver on imaging- Treated  . Allergic rhinitis   . Anemia    EGD with duodenal AVM, normal colonoscopy 04/2012  . Asthma   . Callus of foot 2009  . Chest pain, musculoskeletal 2011  . Cholelithiasis    [place] 09/2008: Cholelithiasis is present, s/p cholecystectomy  . Depression   . Excessive cerumen in ear canal    since before 2000  . GERD (gastroesophageal reflux disease)   . History of blood transfusion   . Hyperlipidemia   . Hypertension   . Menopausal syndrome    Treated with estradiol in the past - discontinued 04/2012  . MENOPAUSAL SYNDROME 09/24/2006   2008 Note : The pt is adament about having this medication.  She fully understands the increased risk of heart disease and cancer that is associated with HRT and is willing to accept this risk in order to prevent the debilitating hot flashes she has off this medication.  Will continue to encourage her to try to wean herself off of this medication at our next visit.  2009 note indicated that she tr  . Otitis externa, acute Feb 2012   bilateral, treated with azithromycin after failure of neomycin drops  . Otitis media, acute Feb 2012  . PONV (postoperative nausea and vomiting)       ROS:   Negative except as per HPI.   Assessment / Plan / Recommendations:   Please see A&P under problem oriented charting for assessment of the patient's acute and chronic medical conditions.   As always, pt is advised that if symptoms worsen or new symptoms arise, they should go to an urgent care facility or to to ER for further evaluation.   Consent and Medical Decision Making:    Patient discussed with Dr. Evette Doffing  This is a telephone encounter between Kimberly [name] and Kathi Ludwig on 08/04/2019 for back pain. The visit was conducted with the patient located at home and [employer] at [facility]. The patient's identity was confirmed using their DOB and current address. The patient has consented to being evaluated through a telephone encounter and understands the associated risks (an examination cannot be done and the patient may need to come in for an appointment) / benefits (allows the patient to remain at home, decreasing exposure to coronavirus). I personally spent 9 minutes on medical discussion.

## 2019-08-04 NOTE — Assessment & Plan Note (Addendum)
Back Pain: Pain occurred first approximately 1-2 weeks prior when she was lifting a heavy box filled with canned food. She felt the pain as soon as this occurred. She has been applying IcyHot patches which help while they are placed but do not provide lasting relief.  She has not tried Tylenol or ibuprofen or other pain medication. The pain is located on the right around the area that her kidneys are located.  It is not midline and the left is not involved. The pain is not worse with walking but is more noticeable at that time. It is worse with lifting. She denied hematuria but does endorse dysuria at times over the past several weeks. She denied fever, chills, or chest pain.  She has an allergy to Aleve, Ibuprofen and meloxicam with a rash. Given her BMI, tobacco use and age she has an increased risk for osteoporosis and as such compression fractures.  Plan: The patient will visit the clinic on March 11 for a UA, possible BMP and consideration of imaging versus DEXA scan as indicated by her physical exam Baclofen 10mg  BID

## 2019-08-06 ENCOUNTER — Ambulatory Visit (INDEPENDENT_AMBULATORY_CARE_PROVIDER_SITE_OTHER): Payer: Medicare Other | Admitting: Internal Medicine

## 2019-08-06 ENCOUNTER — Encounter: Payer: Self-pay | Admitting: Internal Medicine

## 2019-08-06 VITALS — BP 146/77 | HR 75 | Temp 97.8°F | Ht 64.0 in | Wt 101.4 lb

## 2019-08-06 DIAGNOSIS — M545 Low back pain, unspecified: Secondary | ICD-10-CM

## 2019-08-06 DIAGNOSIS — R300 Dysuria: Secondary | ICD-10-CM | POA: Diagnosis present

## 2019-08-06 LAB — POCT URINALYSIS DIPSTICK
Bilirubin, UA: NEGATIVE
Glucose, UA: NEGATIVE
Ketones, UA: NEGATIVE
Nitrite, UA: NEGATIVE
Protein, UA: NEGATIVE
Spec Grav, UA: 1.025 (ref 1.010–1.025)
Urobilinogen, UA: 0.2 E.U./dL
pH, UA: 7.5 (ref 5.0–8.0)

## 2019-08-06 MED ORDER — CEFDINIR 300 MG PO CAPS: 300.0000 mg | ORAL_CAPSULE | Freq: Two times a day (BID) | ORAL | 0 refills | Status: DC

## 2019-08-06 NOTE — Assessment & Plan Note (Signed)
Dysuria: The patient initially mentioned intermittent dysuria that now has progressed to almost constant dysuria associated with lower anterior pelvic pain.  This is not associated with fever, chills, or hematuria.  There is also associated flank pain that is not severe, and not colicky in nature.  The pain is 5/10 at most.  She requests and ultrasound of the kidneys today.  We discussed the utility of ultrasound versus CT scan and she is okay with foregoing both at this time given the lack of indication for prominent renal colliculi.  There is hemoglobin in her urine dip testing.  We will plan to treat her for complicated cystitis repeat UA in approximately 4 weeks.  Consider imaging if her symptoms worsen, serum creatinine increased indicating an AKI, she fails to improve with antibiotic therapy or she has persistent hemoglobinuria at the 4-week mark. Per chart review no prior history of recent organisms cultured.  Given her age, in susceptibilities with penicillin allergy and the complicated nature of her cystitis we have opted for a cephalosporin of 10 days duration.  Plan:  UA dip with Hgb and small Leucs UA with reflex  BMP today for renal function Cefdinir 300 mg twice daily for 10 days Patient given strict return precautions

## 2019-08-06 NOTE — Patient Instructions (Signed)
FOLLOW-UP INSTRUCTIONS When: 3-4 wks For: Repeat urine testing What to bring: All of your medications  Today we discussed your pain with peeing and flank pain. I will notify you of the results of any labs from today's evaluation when available to me. I have prescribed a medication called Cefdinir for your infection.   As always if your symptoms worsen, fail to improve, or you develop other concerning symptoms, please notify our office or visit the local ER if we are unavailable. Symptoms including afever, weakness, chills, unrelenting pain, should not be ignored and should encourage you to visit the ED if we are unavailable by phone or the symptoms are severe.  Thank you for your visit to the [facility] today. If you have any questions or concerns please call us at [phone].

## 2019-08-06 NOTE — Assessment & Plan Note (Signed)
Back Pain: Initially with some concern for MSK injury given the patient's association with the onset of pain and lifting heavy object.  However, after unremarkable physical exam today with the exception of the CVA tenderness and less concerned for MSK type injury.  Patient was able to step up and sit on exam table without pain, there is no pain with straight leg raise, or exam of the hips/knees.  There is no numbness or tingling.  Plan: Okay with continue the baclofen as needed Continue Tylenol as needed not to exceed 3 g daily Continue IcyHot as needed

## 2019-08-06 NOTE — Progress Notes (Signed)
   CC: Back pain  HPI:Kimberly [name] is a 64 y.o. female who presents for evaluation of back pain. Please see individual problem based A/P for details.  Depression, PHQ-9: Based on the patients    Office Visit from 08/06/2019 in [place]  PHQ-9 Total Score  0     score we have decided to monitor.  Past Medical History:  Diagnosis Date  . Alcohol abuse, in remission    since around 2005  . Alcoholic hepatitis    fatty liver on imaging- Treated  . Allergic rhinitis   . Anemia    EGD with duodenal AVM, normal colonoscopy 04/2012  . Asthma   . Callus of foot 2009  . Chest pain, musculoskeletal 2011  . Cholelithiasis    [place] 09/2008: Cholelithiasis is present, s/p cholecystectomy  . Depression   . Excessive cerumen in ear canal    since before 2000  . GERD (gastroesophageal reflux disease)   . History of blood transfusion   . Hyperlipidemia   . Hypertension   . Menopausal syndrome    Treated with estradiol in the past - discontinued 04/2012  . MENOPAUSAL SYNDROME 09/24/2006   2008 Note : The pt is adament about having this medication.  She fully understands the increased risk of heart disease and cancer that is associated with HRT and is willing to accept this risk in order to prevent the debilitating hot flashes she has off this medication.  Will continue to encourage her to try to wean herself off of this medication at our next visit.  2009 note indicated that she tr  . Otitis externa, acute Feb 2012   bilateral, treated with azithromycin after failure of neomycin drops  . Otitis media, acute Feb 2012  . PONV (postoperative nausea and vomiting)    Review of Systems:  ROS negative except as per HPI.  Physical Exam: Vitals:   08/06/19 0851  BP: (!) 146/77  Pulse: 75  Temp: 97.8 F (36.6 C)  TempSrc: Oral  SpO2: 100%  Weight: 101 lb 6.4 oz (46 kg)  Height: 5\' 4"  (1.626 m)   Filed Weights   08/06/19 0851  Weight: 101 lb 6.4 oz (46 kg)    General: A/O x4, in no acute distress, afebrile, nondiaphoretic HEENT: PEERL, EMO intact Cardio: RRR, no mrg's  Pulmonary: CTA bilaterally, no wheezing or crackles  Abdomen: Bowel sounds normal, soft, flank pain on exam with CVA tenderness  MSK: BLE nontender, nonedematous Neuro: Alert, CNII-XII grossly intact, conversational, strength 5/5 in the upper and lower extremities bilaterally, normal gait Psych: Appropriate affect, not depressed in appearance, engages well  Assessment & Plan:   See Encounters Tab for problem based charting.  Patient discussed with Dr. Rebeca Alert

## 2019-08-07 ENCOUNTER — Telehealth: Payer: Self-pay | Admitting: Internal Medicine

## 2019-08-07 DIAGNOSIS — I10 Essential (primary) hypertension: Secondary | ICD-10-CM

## 2019-08-07 LAB — BMP8+ANION GAP
Anion Gap: 15 mmol/L (ref 10.0–18.0)
BUN/Creatinine Ratio: 10 — ABNORMAL LOW (ref 12–28)
BUN: 8 mg/dL (ref 8–27)
CO2: 28 mmol/L (ref 20–29)
Calcium: 9.6 mg/dL (ref 8.7–10.3)
Chloride: 98 mmol/L (ref 96–106)
Creatinine, Ser: 0.78 mg/dL (ref 0.57–1.00)
GFR calc Af Amer: 92 mL/min/{1.73_m2} (ref >59)
GFR calc non Af Amer: 80 mL/min/{1.73_m2} (ref >59)
Glucose: 87 mg/dL (ref 65–99)
Potassium: 3 mmol/L — ABNORMAL LOW (ref 3.5–5.2)
Sodium: 141 mmol/L (ref 134–144)

## 2019-08-07 LAB — MICROSCOPIC EXAMINATION
Casts: NONE SEEN /lpf
Epithelial Cells (non renal): >10 /hpf — AB (ref 0–10)

## 2019-08-07 LAB — URINALYSIS, ROUTINE W REFLEX MICROSCOPIC
Bilirubin, UA: NEGATIVE
Glucose, UA: NEGATIVE
Ketones, UA: NEGATIVE
Nitrite, UA: NEGATIVE
Protein,UA: NEGATIVE
RBC, UA: NEGATIVE
Specific Gravity, UA: 1.012 (ref 1.005–1.030)
Urobilinogen, Ur: 0.2 mg/dL (ref 0.2–1.0)
pH, UA: >=9 — AB (ref 5.0–7.5)

## 2019-08-07 MED ORDER — HYDROCHLOROTHIAZIDE 12.5 MG PO TABS: 12.5000 mg | ORAL_TABLET | Freq: Every day | ORAL | 1 refills | Status: DC

## 2019-08-07 MED ORDER — LOSARTAN POTASSIUM 25 MG PO TABS: 25.0000 mg | ORAL_TABLET | Freq: Every day | ORAL | 0 refills | Status: DC

## 2019-08-07 NOTE — Telephone Encounter (Signed)
Called patient to notify her that based on her recent lab results with her hypokalemia of 3.0 and sCr of 0.78 that I would decrease her HCTZ to 12.5 mg daily and start a blood pressure lowering agent known as Losartan at 25mg  daily. This should maintain her blood pressure and allow correction of her low potassium.  I would also like to see how she is doing from her appointment on 3/11.  Kathi Ludwig, MD [facility] Internal Medicine, PGY-3

## 2019-08-07 NOTE — Progress Notes (Signed)
Internal Medicine Clinic Attending  Case discussed with Dr. Berline Lopes at the time of the visit.  We reviewed the resident's history and exam and pertinent patient test results.  I agree with the assessment, diagnosis, and plan of care documented in the resident's note.  Urinalysis shows very alkaline urine, which was also seen on prior UA 2 months ago. No pyuria or nitrites. Alkaline urine raises concern for urease-producing organism such as proteus, though lack of pyuria makes UTI less likely. RTA also on the differential, but serum bicarb is actually slightly elevated, so unlikely.  Given her symptoms, still reasonable to treat for possible UTI, will follow up culture results. Also, given urine pH, she likely is at risk for struvite stones, would evaluate with renal US or non-contrasted CT.  Lenice Pressman, M.D., Ph.D.

## 2019-08-08 LAB — URINE CULTURE

## 2019-08-12 ENCOUNTER — Encounter (HOSPITAL_COMMUNITY): Payer: Self-pay | Admitting: *Deleted

## 2019-08-12 ENCOUNTER — Emergency Department (HOSPITAL_COMMUNITY)
Admission: EM | Admit: 2019-08-12 | Discharge: 2019-08-12 | Disposition: A | Discharge status: Home / Self Care | Payer: Medicare Other | Attending: Emergency Medicine | Admitting: Emergency Medicine

## 2019-08-12 ENCOUNTER — Emergency Department (HOSPITAL_COMMUNITY): Payer: Medicare Other

## 2019-08-12 ENCOUNTER — Other Ambulatory Visit: Payer: Self-pay

## 2019-08-12 ENCOUNTER — Telehealth: Payer: Self-pay

## 2019-08-12 DIAGNOSIS — J45909 Unspecified asthma, uncomplicated: Secondary | ICD-10-CM | POA: Diagnosis not present

## 2019-08-12 DIAGNOSIS — Z87891 Personal history of nicotine dependence: Secondary | ICD-10-CM | POA: Diagnosis not present

## 2019-08-12 DIAGNOSIS — I10 Essential (primary) hypertension: Secondary | ICD-10-CM | POA: Diagnosis not present

## 2019-08-12 DIAGNOSIS — M25512 Pain in left shoulder: Secondary | ICD-10-CM | POA: Diagnosis present

## 2019-08-12 DIAGNOSIS — R000 Tachycardia, unspecified: Secondary | ICD-10-CM | POA: Diagnosis not present

## 2019-08-12 DIAGNOSIS — Z79899 Other long term (current) drug therapy: Secondary | ICD-10-CM | POA: Diagnosis not present

## 2019-08-12 DIAGNOSIS — R202 Paresthesia of skin: Secondary | ICD-10-CM | POA: Insufficient documentation

## 2019-08-12 LAB — BASIC METABOLIC PANEL
Anion gap: 10 (ref 5–15)
BUN: 8 mg/dL (ref 8–23)
CO2: 28 mmol/L (ref 22–32)
Calcium: 8.9 mg/dL (ref 8.9–10.3)
Chloride: 102 mmol/L (ref 98–111)
Creatinine, Ser: 0.75 mg/dL (ref 0.44–1.00)
GFR calc Af Amer: >60 mL/min (ref >60)
GFR calc non Af Amer: >60 mL/min (ref >60)
Glucose, Bld: 104 mg/dL — ABNORMAL HIGH (ref 70–99)
Potassium: 3.4 mmol/L — ABNORMAL LOW (ref 3.5–5.1)
Sodium: 140 mmol/L (ref 135–145)

## 2019-08-12 LAB — CBC
HCT: 37.9 % (ref 36.0–46.0)
Hemoglobin: 11.8 g/dL — ABNORMAL LOW (ref 12.0–15.0)
MCH: 30.3 pg (ref 26.0–34.0)
MCHC: 31.1 g/dL (ref 30.0–36.0)
MCV: 97.2 fL (ref 80.0–100.0)
Platelets: 300 10*3/uL (ref 150–400)
RBC: 3.9 MIL/uL (ref 3.87–5.11)
RDW: 12.9 % (ref 11.5–15.5)
WBC: 5.1 10*3/uL (ref 4.0–10.5)
nRBC: 0 % (ref 0.0–0.2)

## 2019-08-12 LAB — TROPONIN I (HIGH SENSITIVITY): Troponin I (High Sensitivity): 4 ng/L (ref <18)

## 2019-08-12 MED ORDER — SODIUM CHLORIDE 0.9% FLUSH: 3.0000 mL | Freq: Once | INTRAVENOUS | Status: DC

## 2019-08-12 NOTE — Telephone Encounter (Signed)
RTC to patient to see if RN could assist her, when pt answered the phone she states "I don't feel right and the paramedic is here now, the ambulance may take me to the hospital, I have to go" and pt ended call. Will forward to PCP and Dr. Berline Lopes. SChaplin, RN,BSN

## 2019-08-12 NOTE — Telephone Encounter (Signed)
Requesting to speak with Dr. Berline Lopes, please call pt back.

## 2019-08-12 NOTE — ED Notes (Signed)
Pt did not want to stay for all results. Dr Gilford Raid at beside to speak with pt. Verbalized understanding of DC instructions and follow up care.

## 2019-08-12 NOTE — ED Triage Notes (Addendum)
Pt here via [facility] for L shoulder pain yesterday and  And L arm tingling today.  Stroke screen neg.  Pt states she no longer is experiencing tingling, though she does have some shoulder pain.  Given bolus of 500 ns en-route for hr of 120.  Presently being tx for bladder infection.  120 hr 99 temp cbg 147 116/90 16

## 2019-08-12 NOTE — ED Provider Notes (Addendum)
[place] EMERGENCY DEPARTMENT Provider Note   CSN: [id number] Arrival date & time: 08/12/19  1559     History Chief Complaint  Patient presents with  . Extremity Weakness    Kimberly [name] is a 64 y.o. female.  Pt presents to the ED today with left shoulder pain and left arm tingling.  Pt called EMS today.  Stroke screen negative.  Pt's HR was 120, so she was given 500 cc NS.  HR now nl.  Pt saw her pcp on 3/12 and was put on several new meds, including baclofen.  She said she did not feel right after taking it and has stopped it.  She thinks that is what is making her feel bad.  Pt said she's been moving and hit her right shoulder on the wall 2 days ago.  She said she has no pain now.  No cp.  No sob.  No f/c.          Past Medical History:  Diagnosis Date  . Alcohol abuse, in remission    since around 2005  . Alcoholic hepatitis    fatty liver on imaging- Treated  . Allergic rhinitis   . Anemia    EGD with duodenal AVM, normal colonoscopy 04/2012  . Asthma   . Callus of foot 2009  . Chest pain, musculoskeletal 2011  . Cholelithiasis    [place] 09/2008: Cholelithiasis is present, s/p cholecystectomy  . Depression   . Excessive cerumen in ear canal    since before 2000  . GERD (gastroesophageal reflux disease)   . History of blood transfusion   . Hyperlipidemia   . Hypertension   . Menopausal syndrome    Treated with estradiol in the past - discontinued 04/2012  . MENOPAUSAL SYNDROME 09/24/2006   2008 Note : The pt is adament about having this medication.  She fully understands the increased risk of heart disease and cancer that is associated with HRT and is willing to accept this risk in order to prevent the debilitating hot flashes she has off this medication.  Will continue to encourage her to try to wean herself off of this medication at our next visit.  2009 note indicated that she tr  . Otitis externa, acute Feb 2012   bilateral, treated with  azithromycin after failure of neomycin drops  . Otitis media, acute Feb 2012  . PONV (postoperative nausea and vomiting)     Patient Active Problem List   Diagnosis Date Noted  . Acute right-sided low back pain without sciatica 08/04/2019  . Dysuria 05/25/2019  . Tachycardia 05/25/2019  . Hematuria 05/25/2019  . Ear pain, bilateral 04/27/2019  . 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 Vaginal discharge 01/18/2019  . Abdominal gas pain 12/16/2018  . Microcytic anemia 11/28/2018  . Pain in right thigh 01/15/2018  . Health care maintenance 03/25/2013  . Depression 12/03/2012  . Iron deficiency anemia 05/08/2012  . HLD (hyperlipidemia) 03/25/2008  . Constipation 11/04/2007  . Essential hypertension 09/24/2006  . Allergies 09/24/2006  . Asthma 09/24/2006  . GERD 09/24/2006    Past Surgical History:  Procedure Laterality Date  . ABDOMINAL HYSTERECTOMY  1980s  . BIOPSY  03/18/2019   Procedure: BIOPSY;  Surgeon: Irving Copas., MD;  Location: [place];  Service: Gastroenterology;;  . Lorin Mercy  01/06/09  . COLONOSCOPY  05/09/2012   Procedure: COLONOSCOPY;  Surgeon: Inda Castle, MD;  Location: [place];  Service: Endoscopy;  Laterality: N/A;  .  COLONOSCOPY WITH PROPOFOL N/A 03/18/2019   Procedure: COLONOSCOPY WITH PROPOFOL;  Surgeon: Rush Landmark Telford Nab., MD;  Location: [place];  Service: Gastroenterology;  Laterality: N/A;  . ENTEROSCOPY N/A 03/18/2019   Procedure: ENTEROSCOPY;  Surgeon: Rush Landmark Telford Nab., MD;  Location: [place];  Service: Gastroenterology;  Laterality: N/A;  . ESOPHAGOGASTRODUODENOSCOPY  05/09/2012   Procedure: ESOPHAGOGASTRODUODENOSCOPY (EGD);  Surgeon: Inda Castle, MD;  Location: [place];  Service: Endoscopy;  Laterality: N/A;  . HEMOSTASIS CLIP PLACEMENT  03/18/2019   Procedure: HEMOSTASIS CLIP PLACEMENT;  Surgeon: Irving Copas., MD;  Location: [place];   Service: Gastroenterology;;  . HOT HEMOSTASIS N/A 03/18/2019   Procedure: HOT HEMOSTASIS (ARGON PLASMA COAGULATION/BICAP);  Surgeon: Irving Copas., MD;  Location: [place];  Service: Gastroenterology;  Laterality: N/A;  . POLYPECTOMY  03/18/2019   Procedure: POLYPECTOMY;  Surgeon: Rush Landmark Telford Nab., MD;  Location: [place];  Service: Gastroenterology;;  . [place] INJECTION  03/18/2019   Procedure: SUBMUCOSAL TATTOO INJECTION;  Surgeon: Irving Copas., MD;  Location: [place];  Service: Gastroenterology;;     OB History   No obstetric history on file.     Family History  Problem Relation Age of Onset  . Hypertension Mother   . Colon cancer Neg Hx   . Esophageal cancer Neg Hx   . Inflammatory bowel disease Neg Hx   . Liver disease Neg Hx   . Pancreatic cancer Neg Hx   . Rectal cancer Neg Hx   . Stomach cancer Neg Hx     Social History   Tobacco Use  . Smoking status: Former Smoker    Types: Cigarettes    Quit date: 08/21/1995    Years since quitting: 23.9  . Smokeless tobacco: Never Used  Substance Use Topics  . Alcohol use: No    Comment: in remission since 2007  . Drug use: No    Home Medications Prior to Admission medications   Medication Sig Start Date End Date Taking? Authorizing Provider  amitriptyline (ELAVIL) 25 MG tablet TAKE 1 TABLET BY MOUTH AT BEDTIME 06/24/19   Seawell, Jaimie A, DO  baclofen (LIORESAL) 10 MG tablet Take 1 tablet (10 mg total) by mouth 2 (two) times daily. 08/04/19 08/03/20  Kathi Ludwig, MD  cefdinir (OMNICEF) 300 MG capsule Take 1 capsule (300 mg total) by mouth 2 (two) times daily. For ten days. 08/06/19   Kathi Ludwig, MD  Ferrous Gluconate 256 (28 Fe) MG TABS Take 256 mg by mouth daily. 05/25/19   Kathi Ludwig, MD  FLOVENT HFA 110 MCG/ACT inhaler Inhale 2 puffs by mouth twice daily 07/01/19   Bartholomew Crews, MD  hydrochlorothiazide (HYDRODIURIL) 12.5 MG tablet Take 1  tablet (12.5 mg total) by mouth daily. 08/07/19   Kathi Ludwig, MD  iron polysaccharides (NIFEREX) 150 MG capsule Take 150 mg by mouth daily.    [provider]  iron polysaccharides (NU-IRON) 150 MG capsule Take 1 capsule (150 mg total) by mouth daily. Patient not taking: Reported on 03/11/2019 12/15/18   Seawell, Andris Baumann A, DO  losartan (COZAAR) 25 MG tablet Take 1 tablet (25 mg total) by mouth daily. 08/07/19   Kathi Ludwig, MD  nitrofurantoin, macrocrystal-monohydrate, (MACROBID) 100 MG capsule Take 1 capsule (100 mg total) by mouth 2 (two) times daily. 05/22/19   Corena Herter, PA-C  pantoprazole (PROTONIX) 40 MG tablet TAKE 1 TABLET BY MOUTH TWICE DAILY FOR  1  MOUTH,  THEN  DECREASE  BACK  TO  ONCE  DAILY  THEREAFTER 07/20/19   Mansouraty, Telford Nab., MD  phenazopyridine (PYRIDIUM) 200 MG tablet Take 1 tablet (200 mg total) by mouth 3 (three) times daily. 05/22/19   Corena Herter, PA-C  pravastatin (PRAVACHOL) 40 MG tablet Take 1 tablet (40 mg total) by mouth every evening. 06/17/19   Seawell, Jaimie A, DO    Allergies    Banana, Grape (artificial) flavor, Ibuprofen, and Penicillins  Review of Systems   Review of Systems  Musculoskeletal:       Shoulder pain  All other systems reviewed and are negative.   Physical Exam Updated Vital Signs BP 116/79 (BP Location: Right Arm)   Pulse 88   Temp 98.6 F (37 C) (Oral)   Resp 16   Ht 5\' 4"  (1.626 m)   Wt 45.9 kg   LMP 08/21/1982   SpO2 100%   BMI 17.37 kg/m   Physical Exam Vitals and nursing note reviewed.  Constitutional:      Appearance: Normal appearance.  HENT:     Head: Normocephalic and atraumatic.     Right Ear: External ear normal.     Left Ear: External ear normal.     Nose: Nose normal.     Mouth/Throat:     Mouth: Mucous membranes are moist.     Pharynx: Oropharynx is clear.  Eyes:     Extraocular Movements: Extraocular movements intact.     Conjunctiva/sclera: Conjunctivae normal.       Pupils: Pupils are equal, round, and reactive to light.  Cardiovascular:     Rate and Rhythm: Normal rate and regular rhythm.     Pulses: Normal pulses.     Heart sounds: Normal heart sounds.  Pulmonary:     Effort: Pulmonary effort is normal.     Breath sounds: Normal breath sounds.  Abdominal:     General: Abdomen is flat. Bowel sounds are normal.     Palpations: Abdomen is soft.  Musculoskeletal:        General: Normal range of motion.     Cervical back: Normal range of motion and neck supple.  Skin:    General: Skin is warm.     Capillary Refill: Capillary refill takes less than 2 seconds.  Neurological:     General: No focal deficit present.     Mental Status: She is alert and oriented to person, place, and time.  Psychiatric:        Mood and Affect: Mood normal.        Behavior: Behavior normal.        Thought Content: Thought content normal.        Judgment: Judgment normal.     ED Results / Procedures / Treatments   Labs (all labs ordered are listed, but only abnormal results are displayed) Labs Reviewed  BASIC METABOLIC PANEL - Abnormal; Notable for the following components:      Result Value   Potassium 3.4 (*)    Glucose, Bld 104 (*)    All other components within normal limits  CBC - Abnormal; Notable for the following components:   Hemoglobin 11.8 (*)    All other components within normal limits  TROPONIN I (HIGH SENSITIVITY)  TROPONIN I (HIGH SENSITIVITY)    EKG EKG Interpretation  Date/Time:  Wednesday August 12 2019 16:00:14 EDT Ventricular Rate:  88 PR Interval:  130 QRS Duration: 90 QT Interval:  394 QTC Calculation: 476 R Axis:   71 Text Interpretation: Sinus rhythm with occasional Premature ventricular  complexes Otherwise normal ECG nonspecific t wave changes resolved since last tracing Confirmed by Dorie Rank [phone]) on 08/12/2019 5:17:11 PM   Radiology DG Chest 2 View  Result Date: 08/12/2019 CLINICAL DATA:  Left upper extremity  pain with left shoulder tingling sensation since yesterday. History of hypertension and asthma. EXAM: CHEST - 2 VIEW COMPARISON:  Radiographs 05/08/2012. FINDINGS: The heart size and mediastinal contours are stable. There is stable pulmonary hyperinflation with mild biapical scarring. No edema, confluent airspace opacity, pleural effusion or pneumothorax. The bones appear unremarkable. Cholecystectomy clips are noted. IMPRESSION: Stable chest with chronic obstructive pulmonary disease and biapical scarring. No acute cardiopulmonary process. Electronically Signed   By: Richardean Sale M.D.   On: 08/12/2019 16:31    Procedures Procedures (including critical care time)  Medications Ordered in ED Medications  sodium chloride flush (NS) 0.9 % injection 3 mL (has no administration in time range)    ED Course  I have reviewed the triage vital signs and the nursing notes.  Pertinent labs & imaging results that were available during my care of the patient were reviewed by me and considered in my medical decision making (see chart for details).    MDM Rules/Calculators/A&P                     HR is now in the 12s.  Ms. Burgoon is tired of waiting for her labs to come back.  She said she feels fine and is ready to go.  Pt knows to return at any time.  Labs came back prior to d/c, and they are ok other than mild anemia.    Final Clinical Impression(s) / ED Diagnoses Final diagnoses:  Sinus tachycardia    Rx / DC Orders ED Discharge Orders    None       Isla Pence, MD 08/12/19 0034    Isla Pence, MD 08/12/19 1816

## 2019-08-13 ENCOUNTER — Encounter: Payer: Self-pay | Admitting: Internal Medicine

## 2019-08-13 ENCOUNTER — Ambulatory Visit (INDEPENDENT_AMBULATORY_CARE_PROVIDER_SITE_OTHER): Payer: Medicare Other | Admitting: Internal Medicine

## 2019-08-13 ENCOUNTER — Telehealth: Payer: Self-pay | Admitting: Internal Medicine

## 2019-08-13 VITALS — BP 98/56 | HR 87 | Temp 98.2°F | Ht 64.0 in | Wt 102.5 lb

## 2019-08-13 DIAGNOSIS — R319 Hematuria, unspecified: Secondary | ICD-10-CM

## 2019-08-13 DIAGNOSIS — M549 Dorsalgia, unspecified: Secondary | ICD-10-CM

## 2019-08-13 DIAGNOSIS — R300 Dysuria: Secondary | ICD-10-CM | POA: Diagnosis present

## 2019-08-13 DIAGNOSIS — Z90710 Acquired absence of both cervix and uterus: Secondary | ICD-10-CM

## 2019-08-13 LAB — POCT URINALYSIS DIPSTICK
Bilirubin, UA: NEGATIVE
Glucose, UA: NEGATIVE
Ketones, UA: NEGATIVE
Nitrite, UA: NEGATIVE
Protein, UA: NEGATIVE
Spec Grav, UA: 1.025 (ref 1.010–1.025)
Urobilinogen, UA: 0.2 E.U./dL
pH, UA: 7 (ref 5.0–8.0)

## 2019-08-13 NOTE — Progress Notes (Signed)
   CC: hematuria and dysuria  HPI:[name] is a 64 y.o. female who presents for evaluation of dysuria and back pain. Please see individual problem based A/P for details.    Past Medical History:  Diagnosis Date  . Alcohol abuse, in remission    since around 2005  . Alcoholic hepatitis    fatty liver on imaging- Treated  . Allergic rhinitis   . Anemia    EGD with duodenal AVM, normal colonoscopy 04/2012  . Asthma   . Callus of foot 2009  . Chest pain, musculoskeletal 2011  . Cholelithiasis    [place] 09/2008: Cholelithiasis is present, s/p cholecystectomy  . Depression   . Excessive cerumen in ear canal    since before 2000  . GERD (gastroesophageal reflux disease)   . History of blood transfusion   . Hyperlipidemia   . Hypertension   . Menopausal syndrome    Treated with estradiol in the past - discontinued 04/2012  . MENOPAUSAL SYNDROME 09/24/2006   2008 Note : The pt is adament about having this medication.  She fully understands the increased risk of heart disease and cancer that is associated with HRT and is willing to accept this risk in order to prevent the debilitating hot flashes she has off this medication.  Will continue to encourage her to try to wean herself off of this medication at our next visit.  2009 note indicated that she tr  . Otitis externa, acute Feb 2012   bilateral, treated with azithromycin after failure of neomycin drops  . Otitis media, acute Feb 2012  . PONV (postoperative nausea and vomiting)    Review of Systems:  ROS negative except as per HPI.  Physical Exam: Vitals:   08/13/19 1025  BP: (!) 98/56  Pulse: 87  SpO2: 100%  Weight: 102 lb 8 oz (46.5 kg)  Height: 5\' 4"  (1.626 m)   Filed Weights   08/13/19 1025  Weight: 102 lb 8 oz (46.5 kg)   General: A/O x4, in no acute distress, afebrile, nondiaphoretic HEENT: PEERL, EMO intact Cardio: RRR, no mrg's  Pulmonary: CTA bilaterally, no wheezing or crackles  Abdomen: Bowel sounds  normal, soft, nontender with exception of the lower central/suprapubic region MSK: CVA tenderness Psych: Appropriate affect, not depressed in appearance, engages well  Assessment & Plan:   See Encounters Tab for problem based charting.  Patient discussed with Dr. Philipp Ovens

## 2019-08-13 NOTE — Assessment & Plan Note (Signed)
See dysuria A/P from same day.

## 2019-08-13 NOTE — Telephone Encounter (Signed)
RTC to patient, she states "I went to the ER yesterday by ambulance and everything was ok, but I am sick and tired of y'all giving me the run around.  I know my body and I know I need an ultrasound on my kidneys because I have kidney stones.  I'm tired of being in pain and I'm tired of taking medicine".  RN asked pt what type of symptoms she was having, she states burning with urination and frequency.  RN informed pt she may have a UTI and would need an appt to come in, pt states she wants an u/s.  RN states she would need to be seen in [facility] and provide a urine sample to rule out UTI and MD visit, she agreed.  Appt made for today in [facility] at 1015.   Will forward to MD's in [facility] rotation for today. SChaplin, RN,BSN

## 2019-08-13 NOTE — Telephone Encounter (Signed)
Pt requesting a nurse to callback [phone]

## 2019-08-13 NOTE — Assessment & Plan Note (Signed)
  Dysuria and back pain: The patient endorses persistent dysuria and CVA area back pain. Was seen in the ER for tachycardia and feeling achy. Is requesting a renal ultrasound today.  Although she informed the ED provider that she was not taking her cefdinir she has just 4 tablets with her today and endorses taking this medication every 12 hours as prescribed since the 11th.  She denies gross hematuria, fever, chills, headache, shortness of breath, cough, abdominal pain chest pain.  She endorses only associated lower back pelvic area discomfort and generally feeling achy. Patient denied vaginal dryness, discharge or pain. She stated that she had a TAH and no longer has her cervix or uterus.  Given her failure to improve thus far with cefdinir and trace hemoglobin still present on UA will order a CT renal study.  Plan: POC UA with trace Hgb CT renal study ordered Complete course of cefdinir Patient given strict return precautions

## 2019-08-13 NOTE — Patient Instructions (Signed)
FOLLOW-UP INSTRUCTIONS When: As needed For: Routine or if your symptoms fail to improve What to bring: All of your medications  I have ordered a CT Renal study. Please make certain to answer your phone if someone calls to schedule.   As always if your symptoms worsen, fail to improve, or you develop other concerning symptoms, please notify our office or visit the local ER if we are unavailable. Symptoms including fever, chills, blood in your urine, should not be ignored and should encourage you to visit the ED if we are unavailable by phone or the symptoms are severe.  Thank you for your visit to the [facility] today. If you have any questions or concerns please call us at [phone].

## 2019-08-14 ENCOUNTER — Other Ambulatory Visit: Payer: Self-pay

## 2019-08-14 ENCOUNTER — Encounter (HOSPITAL_COMMUNITY): Payer: Self-pay | Admitting: Emergency Medicine

## 2019-08-14 ENCOUNTER — Telehealth: Payer: Self-pay

## 2019-08-14 ENCOUNTER — Emergency Department (HOSPITAL_COMMUNITY): Payer: Medicare Other

## 2019-08-14 ENCOUNTER — Emergency Department (HOSPITAL_COMMUNITY)
Admission: EM | Admit: 2019-08-14 | Discharge: 2019-08-14 | Disposition: A | Discharge status: Home / Self Care | Payer: Medicare Other | Attending: Emergency Medicine | Admitting: Emergency Medicine

## 2019-08-14 DIAGNOSIS — J45909 Unspecified asthma, uncomplicated: Secondary | ICD-10-CM | POA: Insufficient documentation

## 2019-08-14 DIAGNOSIS — Z9049 Acquired absence of other specified parts of digestive tract: Secondary | ICD-10-CM | POA: Insufficient documentation

## 2019-08-14 DIAGNOSIS — R202 Paresthesia of skin: Secondary | ICD-10-CM | POA: Diagnosis not present

## 2019-08-14 DIAGNOSIS — I10 Essential (primary) hypertension: Secondary | ICD-10-CM | POA: Diagnosis not present

## 2019-08-14 DIAGNOSIS — Z87891 Personal history of nicotine dependence: Secondary | ICD-10-CM | POA: Insufficient documentation

## 2019-08-14 DIAGNOSIS — R531 Weakness: Secondary | ICD-10-CM | POA: Diagnosis not present

## 2019-08-14 DIAGNOSIS — R519 Headache, unspecified: Secondary | ICD-10-CM | POA: Diagnosis not present

## 2019-08-14 DIAGNOSIS — R1030 Lower abdominal pain, unspecified: Secondary | ICD-10-CM | POA: Diagnosis present

## 2019-08-14 DIAGNOSIS — Z79899 Other long term (current) drug therapy: Secondary | ICD-10-CM | POA: Insufficient documentation

## 2019-08-14 DIAGNOSIS — R200 Anesthesia of skin: Secondary | ICD-10-CM

## 2019-08-14 LAB — CBC WITH DIFFERENTIAL/PLATELET
Abs Immature Granulocytes: 0.01 10*3/uL (ref 0.00–0.07)
Basophils Absolute: 0.1 10*3/uL (ref 0.0–0.1)
Basophils Relative: 1 %
Eosinophils Absolute: 0.2 10*3/uL (ref 0.0–0.5)
Eosinophils Relative: 5 %
HCT: 38.3 % (ref 36.0–46.0)
Hemoglobin: 12 g/dL (ref 12.0–15.0)
Immature Granulocytes: 0 %
Lymphocytes Relative: 32 %
Lymphs Abs: 1.4 10*3/uL (ref 0.7–4.0)
MCH: 30.5 pg (ref 26.0–34.0)
MCHC: 31.3 g/dL (ref 30.0–36.0)
MCV: 97.5 fL (ref 80.0–100.0)
Monocytes Absolute: 0.4 10*3/uL (ref 0.1–1.0)
Monocytes Relative: 9 %
Neutro Abs: 2.4 10*3/uL (ref 1.7–7.7)
Neutrophils Relative %: 53 %
Platelets: 278 10*3/uL (ref 150–400)
RBC: 3.93 MIL/uL (ref 3.87–5.11)
RDW: 12.6 % (ref 11.5–15.5)
WBC: 4.5 10*3/uL (ref 4.0–10.5)
nRBC: 0 % (ref 0.0–0.2)

## 2019-08-14 LAB — URINALYSIS, ROUTINE W REFLEX MICROSCOPIC
Bilirubin Urine: NEGATIVE
Glucose, UA: NEGATIVE mg/dL
Hgb urine dipstick: NEGATIVE
Ketones, ur: NEGATIVE mg/dL
Leukocytes,Ua: NEGATIVE
Nitrite: NEGATIVE
Protein, ur: NEGATIVE mg/dL
Specific Gravity, Urine: 1.005 (ref 1.005–1.030)
pH: 6 (ref 5.0–8.0)

## 2019-08-14 LAB — COMPREHENSIVE METABOLIC PANEL
ALT: 14 U/L (ref 0–44)
AST: 21 U/L (ref 15–41)
Albumin: 3.8 g/dL (ref 3.5–5.0)
Alkaline Phosphatase: 73 U/L (ref 38–126)
Anion gap: 10 (ref 5–15)
BUN: 8 mg/dL (ref 8–23)
CO2: 29 mmol/L (ref 22–32)
Calcium: 9.1 mg/dL (ref 8.9–10.3)
Chloride: 101 mmol/L (ref 98–111)
Creatinine, Ser: 0.77 mg/dL (ref 0.44–1.00)
GFR calc Af Amer: >60 mL/min (ref >60)
GFR calc non Af Amer: >60 mL/min (ref >60)
Glucose, Bld: 102 mg/dL — ABNORMAL HIGH (ref 70–99)
Potassium: 4 mmol/L (ref 3.5–5.1)
Sodium: 140 mmol/L (ref 135–145)
Total Bilirubin: 0.7 mg/dL (ref 0.3–1.2)
Total Protein: 6.6 g/dL (ref 6.5–8.1)

## 2019-08-14 LAB — LIPASE, BLOOD: Lipase: 26 U/L (ref 11–51)

## 2019-08-14 MED ORDER — PROCHLORPERAZINE EDISYLATE 10 MG/2ML IJ SOLN
10.0000 mg | Freq: Once | INTRAMUSCULAR | Status: AC
  Administered 2019-08-14: 10:00:00 10 mg via INTRAVENOUS
  Filled 2019-08-14: qty 2

## 2019-08-14 MED ORDER — SODIUM CHLORIDE 0.9 % IV BOLUS
1000.0000 mL | Freq: Once | INTRAVENOUS | Status: AC
  Administered 2019-08-14: 10:00:00 1000 mL via INTRAVENOUS

## 2019-08-14 MED ORDER — DIPHENHYDRAMINE HCL 50 MG/ML IJ SOLN
25.0000 mg | Freq: Once | INTRAMUSCULAR | Status: AC
  Administered 2019-08-14: 25 mg via INTRAVENOUS
  Filled 2019-08-14: qty 1

## 2019-08-14 NOTE — ED Notes (Signed)
Pt verbalized understanding of discharge instructions. Follow up care reviewed, pt had no further questions. 

## 2019-08-14 NOTE — Discharge Instructions (Signed)
Your work-up today was overall reassuring and after the headache medications, your headache has improved and so has the subjective numbness/tingling/weakness in your left arm and leg.  Given the improvement, we feel you are safe for discharge home now.  Your labs are otherwise reassuring, please continue your antibiotics you are on for the UTI although it has appeared to clear up on the urine today.  Your other labs are reassuring.  The CT scan did show the possible atelectasis versus pneumonia in your lungs, please follow-up with your PCP for this.  Given your lack of symptoms of pneumonia, will not repeat imaging or change plan at this time.  If any symptoms change or worsen, please return to the nearest emergency department.

## 2019-08-14 NOTE — Telephone Encounter (Signed)
Requesting CT scan result, please call pt back.

## 2019-08-14 NOTE — ED Notes (Signed)
Patient transported to CT 

## 2019-08-14 NOTE — ED Provider Notes (Signed)
[facility] EMERGENCY DEPARTMENT Provider Note   CSN: [id number] Arrival date & time: 08/14/19  1761     History Chief Complaint  Patient presents with  . Weakness    Kimberly [name] is a 64 y.o. female.  The history is provided by the patient and medical records. No language interpreter was used.  Flank Pain This is a new problem. The current episode started more than 2 days ago. The problem occurs constantly. The problem has not changed since onset.Associated symptoms include headaches. Pertinent negatives include no chest pain, no abdominal pain and no shortness of breath. Nothing aggravates the symptoms. Nothing relieves the symptoms. She has tried nothing for the symptoms. The treatment provided no relief.       Past Medical History:  Diagnosis Date  . Alcohol abuse, in remission    since around 2005  . Alcoholic hepatitis    fatty liver on imaging- Treated  . Allergic rhinitis   . Anemia    EGD with duodenal AVM, normal colonoscopy 04/2012  . Asthma   . Callus of foot 2009  . Chest pain, musculoskeletal 2011  . Cholelithiasis    [place] 09/2008: Cholelithiasis is present, s/p cholecystectomy  . Depression   . Excessive cerumen in ear canal    since before 2000  . GERD (gastroesophageal reflux disease)   . History of blood transfusion   . Hyperlipidemia   . Hypertension   . Menopausal syndrome    Treated with estradiol in the past - discontinued 04/2012  . MENOPAUSAL SYNDROME 09/24/2006   2008 Note : The pt is adament about having this medication.  She fully understands the increased risk of heart disease and cancer that is associated with HRT and is willing to accept this risk in order to prevent the debilitating hot flashes she has off this medication.  Will continue to encourage her to try to wean herself off of this medication at our next visit.  2009 note indicated that she tr  . Otitis externa, acute Feb 2012   bilateral, treated with  azithromycin after failure of neomycin drops  . Otitis media, acute Feb 2012  . PONV (postoperative nausea and vomiting)     Patient Active Problem List   Diagnosis Date Noted  . Acute right-sided low back pain without sciatica 08/04/2019  . Dysuria 05/25/2019  . Tachycardia 05/25/2019  . Hematuria 05/25/2019  . Ear pain, bilateral 04/27/2019  . 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 Vaginal discharge 01/18/2019  . Abdominal gas pain 12/16/2018  . Microcytic anemia 11/28/2018  . Pain in right thigh 01/15/2018  . Health care maintenance 03/25/2013  . Depression 12/03/2012  . Iron deficiency anemia 05/08/2012  . HLD (hyperlipidemia) 03/25/2008  . Constipation 11/04/2007  . Essential hypertension 09/24/2006  . Allergies 09/24/2006  . Asthma 09/24/2006  . GERD 09/24/2006    Past Surgical History:  Procedure Laterality Date  . ABDOMINAL HYSTERECTOMY  1980s  . BIOPSY  03/18/2019   Procedure: BIOPSY;  Surgeon: Irving Copas., MD;  Location: [facility];  Service: Gastroenterology;;  . Lorin Mercy  01/06/09  . COLONOSCOPY  05/09/2012   Procedure: COLONOSCOPY;  Surgeon: Inda Castle, MD;  Location: [facility] [place];  Service: Endoscopy;  Laterality: N/A;  . COLONOSCOPY WITH PROPOFOL N/A 03/18/2019   Procedure: COLONOSCOPY WITH PROPOFOL;  Surgeon: Rush Landmark Telford Nab., MD;  Location: [facility];  Service: Gastroenterology;  Laterality: N/A;  . ENTEROSCOPY N/A 03/18/2019   Procedure: ENTEROSCOPY;  Surgeon: Irving Copas., MD;  Location: [facility];  Service: Gastroenterology;  Laterality: N/A;  . ESOPHAGOGASTRODUODENOSCOPY  05/09/2012   Procedure: ESOPHAGOGASTRODUODENOSCOPY (EGD);  Surgeon: Inda Castle, MD;  Location: [facility];  Service: Endoscopy;  Laterality: N/A;  . HEMOSTASIS CLIP PLACEMENT  03/18/2019   Procedure: HEMOSTASIS CLIP PLACEMENT;  Surgeon: Irving Copas., MD;  Location: [facility];   Service: Gastroenterology;;  . HOT HEMOSTASIS N/A 03/18/2019   Procedure: HOT HEMOSTASIS (ARGON PLASMA COAGULATION/BICAP);  Surgeon: Irving Copas., MD;  Location: [facility];  Service: Gastroenterology;  Laterality: N/A;  . POLYPECTOMY  03/18/2019   Procedure: POLYPECTOMY;  Surgeon: Rush Landmark Telford Nab., MD;  Location: [facility];  Service: Gastroenterology;;  . [place] INJECTION  03/18/2019   Procedure: SUBMUCOSAL TATTOO INJECTION;  Surgeon: Irving Copas., MD;  Location: [facility];  Service: Gastroenterology;;     OB History   No obstetric history on file.     Family History  Problem Relation Age of Onset  . Hypertension Mother   . Colon cancer Neg Hx   . Esophageal cancer Neg Hx   . Inflammatory bowel disease Neg Hx   . Liver disease Neg Hx   . Pancreatic cancer Neg Hx   . Rectal cancer Neg Hx   . Stomach cancer Neg Hx     Social History   Tobacco Use  . Smoking status: Former Smoker    Types: Cigarettes    Quit date: 08/21/1995    Years since quitting: 23.9  . Smokeless tobacco: Never Used  Substance Use Topics  . Alcohol use: No    Comment: in remission since 2007  . Drug use: No    Home Medications Prior to Admission medications   Medication Sig Start Date End Date Taking? Authorizing Provider  amitriptyline (ELAVIL) 25 MG tablet TAKE 1 TABLET BY MOUTH AT BEDTIME 06/24/19   Seawell, Jaimie A, DO  baclofen (LIORESAL) 10 MG tablet Take 1 tablet (10 mg total) by mouth 2 (two) times daily. 08/04/19 08/03/20  Kathi Ludwig, MD  cefdinir (OMNICEF) 300 MG capsule Take 1 capsule (300 mg total) by mouth 2 (two) times daily. For ten days. 08/06/19   Kathi Ludwig, MD  Ferrous Gluconate 256 (28 Fe) MG TABS Take 256 mg by mouth daily. 05/25/19   Kathi Ludwig, MD  FLOVENT HFA 110 MCG/ACT inhaler Inhale 2 puffs by mouth twice daily 07/01/19   Bartholomew Crews, MD  hydrochlorothiazide (HYDRODIURIL) 12.5 MG tablet Take 1  tablet (12.5 mg total) by mouth daily. 08/07/19   Kathi Ludwig, MD  iron polysaccharides (NIFEREX) 150 MG capsule Take 150 mg by mouth daily.    [provider]  iron polysaccharides (NU-IRON) 150 MG capsule Take 1 capsule (150 mg total) by mouth daily. Patient not taking: Reported on 03/11/2019 12/15/18   Seawell, Andris Baumann A, DO  losartan (COZAAR) 25 MG tablet Take 1 tablet (25 mg total) by mouth daily. 08/07/19   Kathi Ludwig, MD  nitrofurantoin, macrocrystal-monohydrate, (MACROBID) 100 MG capsule Take 1 capsule (100 mg total) by mouth 2 (two) times daily. 05/22/19   Corena Herter, PA-C  pantoprazole (PROTONIX) 40 MG tablet TAKE 1 TABLET BY MOUTH TWICE DAILY FOR  1  MOUTH,  THEN  DECREASE  BACK  TO  ONCE  DAILY  THEREAFTER 07/20/19   Mansouraty, Telford Nab., MD  phenazopyridine (PYRIDIUM) 200 MG tablet Take 1 tablet (200 mg total) by mouth 3 (three) times daily. 05/22/19   Corena Herter, PA-C  pravastatin (PRAVACHOL) 40 MG tablet Take 1 tablet (40 mg total) by mouth every evening. 06/17/19   Seawell, Jaimie A, DO    Allergies    Banana, Grape (artificial) flavor, Ibuprofen, and Penicillins  Review of Systems   Review of Systems  Constitutional: Negative for chills, diaphoresis, fatigue and fever.  HENT: Negative for congestion.   Eyes: Negative for photophobia and visual disturbance.  Respiratory: Negative for cough, chest tightness and shortness of breath.   Cardiovascular: Negative for chest pain, palpitations and leg swelling.  Gastrointestinal: Negative for abdominal pain, constipation, diarrhea, nausea and vomiting.  Genitourinary: Positive for dysuria, flank pain, frequency and hematuria.  Musculoskeletal: Positive for back pain. Negative for neck pain and neck stiffness.  Neurological: Positive for weakness, numbness and headaches. Negative for dizziness, seizures, syncope, facial asymmetry and speech difficulty.  Psychiatric/Behavioral: Negative for agitation.   All other systems reviewed and are negative.   Physical Exam Updated Vital Signs BP 133/76 (BP Location: Right Arm)   Pulse 77   Temp 98.3 F (36.8 C) (Oral)   Resp 14   Ht 5\' 4"  (1.626 m)   Wt 46.5 kg   LMP 08/21/1982   SpO2 100%   BMI 17.59 kg/m   Physical Exam Vitals and nursing note reviewed.  Constitutional:      General: She is not in acute distress.    Appearance: She is well-developed. She is not diaphoretic.  HENT:     Head: Normocephalic and atraumatic.     Right Ear: External ear normal.     Left Ear: External ear normal.     Nose: Nose normal.     Mouth/Throat:     Pharynx: No oropharyngeal exudate.  Eyes:     Conjunctiva/sclera: Conjunctivae normal.     Pupils: Pupils are equal, round, and reactive to light.  Pulmonary:     Effort: No respiratory distress.     Breath sounds: No stridor.  Abdominal:     General: There is no distension.     Tenderness: There is no abdominal tenderness. There is no rebound.  Musculoskeletal:        General: Tenderness present.     Cervical back: Normal range of motion and neck supple.     Lumbar back: Tenderness present.       Back:  Skin:    General: Skin is warm.     Capillary Refill: Capillary refill takes less than 2 seconds.     Findings: No erythema or rash.  Neurological:     General: No focal deficit present.     Mental Status: She is alert and oriented to person, place, and time.     GCS: GCS eye subscore is 4. GCS verbal subscore is 5. GCS motor subscore is 6.     Cranial Nerves: No cranial nerve deficit, dysarthria or facial asymmetry.     Sensory: No sensory deficit.     Motor: No weakness, tremor or abnormal muscle tone.     Deep Tendon Reflexes: Reflexes are normal and symmetric.     Comments: No numbness or weakness on my exam however patient reports subjective and intermittent numbness and weakness in her left arm and left leg.  I did not appreciate this on my exam.  Normal speech.  No facial droop.   Normal sensation of the face.     ED Results / Procedures / Treatments   Labs (all labs ordered are listed, but only abnormal results are displayed) Labs Reviewed  COMPREHENSIVE  METABOLIC PANEL - Abnormal; Notable for the following components:      Result Value   Glucose, Bld 102 (*)    All other components within normal limits  URINALYSIS, ROUTINE W REFLEX MICROSCOPIC - Abnormal; Notable for the following components:   Color, Urine STRAW (*)    All other components within normal limits  URINE CULTURE  CBC WITH DIFFERENTIAL/PLATELET  LIPASE, BLOOD    EKG None  Radiology DG Chest 2 View  Result Date: 08/12/2019 CLINICAL DATA:  Left upper extremity pain with left shoulder tingling sensation since yesterday. History of hypertension and asthma. EXAM: CHEST - 2 VIEW COMPARISON:  Radiographs 05/08/2012. FINDINGS: The heart size and mediastinal contours are stable. There is stable pulmonary hyperinflation with mild biapical scarring. No edema, confluent airspace opacity, pleural effusion or pneumothorax. The bones appear unremarkable. Cholecystectomy clips are noted. IMPRESSION: Stable chest with chronic obstructive pulmonary disease and biapical scarring. No acute cardiopulmonary process. Electronically Signed   By: Richardean Sale M.D.   On: 08/12/2019 16:31   CT Head Wo Contrast  Result Date: 08/14/2019 CLINICAL DATA:  Left sided numbness and tingling.  Headache. EXAM: CT HEAD WITHOUT CONTRAST TECHNIQUE: Contiguous axial images were obtained from the base of the skull through the vertex without intravenous contrast. COMPARISON:  August 11, 2003 FINDINGS: Brain: The ventricles and sulci are normal in size and configuration. There is no intracranial mass, hemorrhage, extra-axial fluid collection, or midline shift. The brain parenchyma appears unremarkable. There is no evident acute infarct. Vascular: No hyperdense vessel.  No evident vascular calcification. Skull: Bony calvarium appears  intact. Sinuses/Orbits: There is mucosal thickening in multiple ethmoid air cells. Other paranasal sinuses are clear. Orbits appear symmetric bilaterally. Other: Mastoid air cells are clear. IMPRESSION: Mucosal thickening in several ethmoid air cells. Brain parenchyma appears unremarkable. No mass or hemorrhage. Electronically Signed   By: Lowella Grip III M.D.   On: 08/14/2019 10:17   CT Renal Stone Study  Result Date: 08/14/2019 CLINICAL DATA:  Left flank pain with hematuria and dysuria EXAM: CT ABDOMEN AND PELVIS WITHOUT CONTRAST TECHNIQUE: Multidetector CT imaging of the abdomen and pelvis was performed following the standard protocol without oral or IV contrast. COMPARISON:  May 01, 2016 FINDINGS: Lower chest: There is an ill-defined area of opacity in the left lower lobe posteriorly seen on axial slice 1 series 4 measuring 1.1 x 0.9 cm. There is slight atelectasis in the lateral right base. There is a degree of underlying emphysematous change. Hepatobiliary: No focal liver lesions are appreciable on this noncontrast enhanced study. The gallbladder is absent. There is no appreciable biliary duct dilatation. Pancreas: There is no pancreatic mass or inflammatory focus. Spleen: No splenic lesions are evident. Adrenals/Urinary Tract: Adrenals bilaterally appear normal. There is no appreciable renal mass or hydronephrosis on either side. There is an extrarenal pelvis on the right, an anatomic variant. There is no evident renal or ureteral calculus on either side. The urinary bladder is midline with wall thickness within normal limits. Stomach/Bowel: There is moderate stool in the colon. There is no appreciable bowel wall or mesenteric thickening. There is no appreciable bowel obstruction. The terminal ileum appears normal. There is no evident free air or portal venous air. Vascular/Lymphatic: There is no abdominal aortic aneurysm. There is aortic and iliac artery atherosclerotic calcification. No  adenopathy is appreciable in the abdomen or pelvis. Reproductive: The uterus is absent. There is no evident pelvic mass. Other: No evident appendiceal inflammation. No abscess or ascites in the  abdomen or pelvis. Musculoskeletal: No blastic or lytic bone lesions. Probable small bone island along the lateral aspect of the sacroiliac joint in the medial inferior left iliac bone. No intramuscular or abdominal wall lesions. IMPRESSION: 1. On the initial CT slice, there is an area of ill-defined opacity measuring 1.1 x 0.9 cm. This area is incompletely visualized. Question pneumonia or atelectasis in this area. Chest radiograph from 2 days prior did not show evidence of lesion in this area. This finding may warrant complete chest CT for complete visualization and better characterization. There is a degree of underlying emphysematous change. 2. No evident renal or ureteral calculus. No hydronephrosis. Urinary bladder wall thickness normal. 3. No evident bowel obstruction. No abscess in the abdomen or pelvis. Appendix region unremarkable. 4.  There is aortic and iliac artery atherosclerotic calcification. 5.  Absent gallbladder. Aortic Atherosclerosis (ICD10-I70.0) and Emphysema (ICD10-J43.9). Electronically Signed   By: Lowella Grip III M.D.   On: 08/14/2019 10:26    Procedures Procedures (including critical care time)  Medications Ordered in ED Medications  sodium chloride 0.9 % bolus 1,000 mL (0 mLs Intravenous Stopped 08/14/19 1314)  prochlorperazine (COMPAZINE) injection 10 mg (10 mg Intravenous Given 08/14/19 0947)  diphenhydrAMINE (BENADRYL) injection 25 mg (25 mg Intravenous Given 08/14/19 0946)    ED Course  I have reviewed the triage vital signs and the nursing notes.  Pertinent labs & imaging results that were available during my care of the patient were reviewed by me and considered in my medical decision making (see chart for details).    MDM Rules/Calculators/A&P                       MAYUMI SUMMERSON is a 64 y.o. female with a past medical history significant for hypertension, hyperlipidemia, asthma, GERD, prior cholecystectomy, and currently being treated with antibiotics for UTI who presents with continued left flank pain with urinary symptoms as well as continued left-sided headache causing numbness and weakness in her left arm and left leg.  Patient reports that she has been seen multiple times in the last few days for the symptoms.  She reports that her symptoms began 3 or 4 days ago and she has been having intermittent headaches causing the left-sided numbness and weakness in her arm and leg.  She denies any imaging of her head recently.  She reports that she called her PCP yesterday who recommended a CT stone study due to the left CVA and flank pain with urinary symptoms.  She is still on antibiotics for the UTI.  She reports her headache is a 5 or 6 out of 10 in severity right now.  She denies fevers, chills, chest pain, shortness breath, palpitations, nausea, vomiting.  She denies any vision changes.  She denies any other new complaints.  On exam, she did not have any focal numbness on my exam she reports the numbness comes and goes.  She also not have any focal weakness on my exam with normal grip strength and leg strength bilaterally.  Should good pulses in all extremities.  She did have some tenderness in her left flank and left CVA area.  Lungs clear chest nontender.  Abdomen nontender.  She will symmetric and reactive with normal extraocular meds.  No facial droop.  Normal sensation of the face.  Neck was nontender with no radicular type pain elicited.   Based on her lack of severe neck pain, low suspicion for a dissection or other neck cause  of her symptoms at this time.  Clinical aspect patient is having either tension or a complicated migraine causing the neurologic symptoms going down her left arm and left leg.  However, as is gone on for several days intermittently,  we will get a CT head to look for other abnormality.  We will give her headache cocktail.  We will also get the CT stone study that her PCP ordered yesterday to rule out infected stone contributing to her urinary symptoms that are persistent.  We will also repeat urinalysis and get labs.  Chest x-ray 2 days ago was unremarkable and she denies any new torso symptoms.  Will hold on further chest x-ray.   Anticipate reassessment after work-up.    Work-up is returned and was overall reassuring.  Urinalysis does not show further UTI but she will continue her antibiotics.  Labs reassuring.  CT of the head showed no acute hemorrhage or other abnormalities intracranially.  Patient's CT of the abdomen pelvis did not show evidence of stones but did show a possible pneumonia with atelectasis.  Patient was informed of this finding and she agrees that she is now having pneumonialike symptoms.  She does not want to be further imaged and agrees with discharge now that her headache has nearly resolved and her numbness and weakness symptoms have resolved as well.  I suspect she is having a complicated migraine causing the symptoms.  Patient agrees with discharge and follow-up with a PCP.  She understands return precautions.  She no other questions or concerns and was discharged in good condition.   Final Clinical Impression(s) / ED Diagnoses Final diagnoses:  Acute nonintractable headache, unspecified headache type  Numbness and tingling  Weakness    Rx / DC Orders ED Discharge Orders    None      Clinical Impression: 1. Acute nonintractable headache, unspecified headache type   2. Numbness and tingling   3. Weakness     Disposition: Discharge  Condition: Good  I have discussed the results, Dx and Tx plan with the pt(& family if present). He/she/they expressed understanding and agree(s) with the plan. Discharge instructions discussed at great length. Strict return precautions discussed and pt &/or  family have verbalized understanding of the instructions. No further questions at time of discharge.    New Prescriptions   No medications on file    Follow Up: [facility] [address] [place]  [id number] [phone] Schedule an appointment as soon as possible for a visit    [facility] [address] [place]       Abdulahi Schor, Gwenyth Allegra, MD 08/14/19 [phone]

## 2019-08-14 NOTE — ED Triage Notes (Signed)
Pt here from home via [facility] for increasingly worse L side HA and weakness. Pt was seen here Wednesday for the same thing, followed up w/ PCP yesterday but pt said s/s are worse today. Pt also reports decreased urine output, dysuria, and hematuria. VSS, AOx4.

## 2019-08-15 ENCOUNTER — Other Ambulatory Visit: Payer: Self-pay | Admitting: Internal Medicine

## 2019-08-15 DIAGNOSIS — R300 Dysuria: Secondary | ICD-10-CM

## 2019-08-15 DIAGNOSIS — E785 Hyperlipidemia, unspecified: Secondary | ICD-10-CM

## 2019-08-15 LAB — URINE CULTURE: Culture: NO GROWTH

## 2019-08-17 ENCOUNTER — Ambulatory Visit: Payer: Medicaid Other

## 2019-08-18 NOTE — Telephone Encounter (Signed)
Called pt, informed her of denial, states she "really didn't need it just wanted in case" states her body is better.

## 2019-08-18 NOTE — Telephone Encounter (Signed)
If she is continuing to have dysuria and requesting antibiotics she may need another clinic visit. I have not seen her and would defer this to Dr. Berline Lopes.

## 2019-08-18 NOTE — Telephone Encounter (Signed)
Refill is not appropriate. She will need another visit with a pelvic exam given her lack of urinary etiology thus far. Please inform patient.

## 2019-08-19 NOTE — Progress Notes (Signed)
Internal Medicine Clinic Attending  Case discussed with Dr. Harbrecht at the time of the visit.  We reviewed the resident's history and exam and pertinent patient test results.  I agree with the assessment, diagnosis, and plan of care documented in the resident's note.   

## 2019-08-22 ENCOUNTER — Other Ambulatory Visit: Payer: Self-pay | Admitting: Gastroenterology

## 2019-08-22 DIAGNOSIS — K219 Gastro-esophageal reflux disease without esophagitis: Secondary | ICD-10-CM

## 2019-08-26 ENCOUNTER — Encounter: Payer: Medicaid Other | Admitting: Internal Medicine

## 2019-08-27 ENCOUNTER — Ambulatory Visit
Admission: RE | Admit: 2019-08-27 | Discharge: 2019-08-27 | Disposition: A | Discharge status: Home / Self Care | Payer: Medicare Other | Source: Ambulatory Visit | Attending: Internal Medicine | Admitting: Internal Medicine

## 2019-08-27 ENCOUNTER — Ambulatory Visit (INDEPENDENT_AMBULATORY_CARE_PROVIDER_SITE_OTHER): Payer: Medicare Other | Admitting: Internal Medicine

## 2019-08-27 ENCOUNTER — Other Ambulatory Visit: Payer: Self-pay

## 2019-08-27 DIAGNOSIS — Z87891 Personal history of nicotine dependence: Secondary | ICD-10-CM | POA: Diagnosis not present

## 2019-08-27 DIAGNOSIS — J310 Chronic rhinitis: Secondary | ICD-10-CM

## 2019-08-27 DIAGNOSIS — T7840XD Allergy, unspecified, subsequent encounter: Secondary | ICD-10-CM

## 2019-08-27 DIAGNOSIS — Z1231 Encounter for screening mammogram for malignant neoplasm of breast: Secondary | ICD-10-CM

## 2019-08-27 DIAGNOSIS — J329 Chronic sinusitis, unspecified: Secondary | ICD-10-CM | POA: Insufficient documentation

## 2019-08-27 MED ORDER — FLUTICASONE PROPIONATE 50 MCG/ACT NA SUSP: 2.0000 | Freq: Every day | NASAL | 1 refills | Status: DC

## 2019-08-27 MED ORDER — CETIRIZINE HCL 10 MG PO TABS: 10.0000 mg | ORAL_TABLET | Freq: Every day | ORAL | 3 refills | Status: DC

## 2019-08-27 NOTE — Assessment & Plan Note (Signed)
Pt with 1 week history of gradual onset bilateral ear ache and sinus pressure below her eyes. Endorses ear pain is worst on the left. States it feels like "a cold in your head." The pressure does not change positionally, and has not been getting better/worse since its onset. She has not tried any other the counter medications at this time. Pt with history of environmental allergies, but takes no daily allergy medication. Denies fevers/chills, ear drainage, sore throat, cough, sick contacts, post nasal drip, or headache. No neck stiffness, limited ROM, or changes in sensation on the face/neck. She was treated for a UTI with a 10 day course of Cefdinir, completed on 3/21, but denies any additional antibiotic exposure.  Assessment - Rhinosinusitis Could be viral vs allergic, but favor allergic at this time given seasonal time of year and history of environmental allergies.  - Flonase 2 sprays in both nostrils daily - Cetirizine 10mg  daily - instructed pt to perform sinus irrigation - follow-up with in person visit if symptoms fail to improve

## 2019-08-27 NOTE — Addendum Note (Signed)
Addended by: Lalla Brothers T on: 08/27/2019 04:18 PM   Modules accepted: Level of Service

## 2019-08-27 NOTE — Progress Notes (Signed)
  [facility] Internal Medicine Residency Telephone Encounter Continuity Care Appointment  HPI:   This telephone encounter was created for [name] on 08/27/2019 for the following purpose/cc left sided sinus pressure.   Past Medical History:  Past Medical History:  Diagnosis Date  . Alcohol abuse, in remission    since around 2005  . Alcoholic hepatitis    fatty liver on imaging- Treated  . Allergic rhinitis   . Anemia    EGD with duodenal AVM, normal colonoscopy 04/2012  . Asthma   . Callus of foot 2009  . Chest pain, musculoskeletal 2011  . Cholelithiasis    [place] 09/2008: Cholelithiasis is present, s/p cholecystectomy  . Depression   . Excessive cerumen in ear canal    since before 2000  . GERD (gastroesophageal reflux disease)   . History of blood transfusion   . Hyperlipidemia   . Hypertension   . Menopausal syndrome    Treated with estradiol in the past - discontinued 04/2012  . MENOPAUSAL SYNDROME 09/24/2006   2008 Note : The pt is adament about having this medication.  She fully understands the increased risk of heart disease and cancer that is associated with HRT and is willing to accept this risk in order to prevent the debilitating hot flashes she has off this medication.  Will continue to encourage her to try to wean herself off of this medication at our next visit.  2009 note indicated that she tr  . Otitis externa, acute Feb 2012   bilateral, treated with azithromycin after failure of neomycin drops  . Otitis media, acute Feb 2012  . PONV (postoperative nausea and vomiting)       ROS:  Review of Systems  Constitutional: Negative for chills and fever.  HENT: Positive for congestion, ear pain (L > R) and sinus pain (L > R). Negative for ear discharge and sore throat.        No PND.  Eyes: Positive for redness. Negative for blurred vision and pain.  Respiratory: Negative for cough, shortness of breath and wheezing.      Assessment / Plan /  Recommendations:   Please see A&P under problem oriented charting for assessment of the patient's acute and chronic medical conditions.   As always, pt is advised that if symptoms worsen or new symptoms arise, they should go to an urgent care facility or to to ER for further evaluation.   Consent and Medical Decision Making:   Patient discussed with Dr. Evette Doffing  This is a telephone encounter between Kimberly [name] and Ladona Horns on 08/27/2019 for left-sided sinus pressure. The visit was conducted with the patient located at home and Ladona Horns at [facility]. The patient's identity was confirmed using their DOB and current address. The patient has consented to being evaluated through a telephone encounter and understands the associated risks (an examination cannot be done and the patient may need to come in for an appointment) / benefits (allows the patient to remain at home, decreasing exposure to coronavirus). I personally spent 12 minutes on medical discussion.

## 2019-08-27 NOTE — Progress Notes (Signed)
Internal Medicine Clinic Attending  Case discussed with Dr. Ronnald Ramp  at the time of the visit.  We reviewed the resident's history and pertinent patient test results.  I agree with the assessment, diagnosis, and plan of care documented in the resident's note.

## 2019-08-27 NOTE — Assessment & Plan Note (Signed)
Pt with progressive seasonal allergies at this time.   - begin daily anti-histamine and intranasal corticosteroid

## 2019-08-29 ENCOUNTER — Encounter (HOSPITAL_COMMUNITY): Payer: Self-pay

## 2019-08-29 ENCOUNTER — Emergency Department (HOSPITAL_COMMUNITY)
Admission: EM | Admit: 2019-08-29 | Discharge: 2019-08-29 | Disposition: A | Discharge status: Home / Self Care | Payer: Medicare Other | Attending: Emergency Medicine | Admitting: Emergency Medicine

## 2019-08-29 ENCOUNTER — Other Ambulatory Visit: Payer: Self-pay

## 2019-08-29 DIAGNOSIS — J45909 Unspecified asthma, uncomplicated: Secondary | ICD-10-CM | POA: Insufficient documentation

## 2019-08-29 DIAGNOSIS — Z79899 Other long term (current) drug therapy: Secondary | ICD-10-CM | POA: Diagnosis not present

## 2019-08-29 DIAGNOSIS — H9202 Otalgia, left ear: Secondary | ICD-10-CM | POA: Diagnosis present

## 2019-08-29 DIAGNOSIS — I10 Essential (primary) hypertension: Secondary | ICD-10-CM | POA: Diagnosis not present

## 2019-08-29 DIAGNOSIS — Z87891 Personal history of nicotine dependence: Secondary | ICD-10-CM | POA: Insufficient documentation

## 2019-08-29 MED ORDER — CARBAMAZEPINE 200 MG PO TABS: 100.0000 mg | ORAL_TABLET | Freq: Two times a day (BID) | ORAL | 0 refills | Status: DC

## 2019-08-29 NOTE — ED Provider Notes (Signed)
[facility] Provider Note   CSN: [id number] Arrival date & time: 08/29/19  1338     History No chief complaint on file.   Kimberly [name] is a 64 y.o. female.  The history is provided by the patient and medical records. No language interpreter was used.   Kimberly [name] is a 64 y.o. female who presents to the Emergency Department complaining of ear pain. She presents the emergency department complaining of left ear pain that is been present for many years. She states that the pain has been worsening recently. Pain is located inside her left ear and radiates throughout her left face. She describes it as an intermittent burning sensation. She does note that the cold weather will make it worse. She cannot describe any additional alleviating or worsening factors. She does not notice any change when she eats. She denies any fevers, numbness, weakness, difficulty swallowing, difficulty breathing. She does have some nasal congestion. No new vision changes. She has not been evaluated by an ENT for this. She was seen in the emergency department a few weeks ago for similar symptoms. No associated fevers, chills, night sweats, weight loss.    Past Medical History:  Diagnosis Date  . Alcohol abuse, in remission    since around 2005  . Alcoholic hepatitis    fatty liver on imaging- Treated  . Allergic rhinitis   . Anemia    EGD with duodenal AVM, normal colonoscopy 04/2012  . Asthma   . Callus of foot 2009  . Chest pain, musculoskeletal 2011  . Cholelithiasis    [place] 09/2008: Cholelithiasis is present, s/p cholecystectomy  . Depression   . Excessive cerumen in ear canal    since before 2000  . GERD (gastroesophageal reflux disease)   . History of blood transfusion   . Hyperlipidemia   . Hypertension   . Menopausal syndrome    Treated with estradiol in the past - discontinued 04/2012  . MENOPAUSAL SYNDROME 09/24/2006   2008 Note : The pt is adament  about having this medication.  She fully understands the increased risk of heart disease and cancer that is associated with HRT and is willing to accept this risk in order to prevent the debilitating hot flashes she has off this medication.  Will continue to encourage her to try to wean herself off of this medication at our next visit.  2009 note indicated that she tr  . Otitis externa, acute Feb 2012   bilateral, treated with azithromycin after failure of neomycin drops  . Otitis media, acute Feb 2012  . PONV (postoperative nausea and vomiting)     Patient Active Problem List   Diagnosis Date Noted  . Rhinosinusitis 08/27/2019  . Acute right-sided low back pain without sciatica 08/04/2019  . Dysuria 05/25/2019  . Tachycardia 05/25/2019  . Hematuria 05/25/2019  . Ear pain, bilateral 04/27/2019  . 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 Vaginal discharge 01/18/2019  . Abdominal gas pain 12/16/2018  . Microcytic anemia 11/28/2018  . Pain in right thigh 01/15/2018  . Health care maintenance 03/25/2013  . Depression 12/03/2012  . Iron deficiency anemia 05/08/2012  . HLD (hyperlipidemia) 03/25/2008  . Constipation 11/04/2007  . Essential hypertension 09/24/2006  . Allergies 09/24/2006  . Asthma 09/24/2006  . GERD 09/24/2006    Past Surgical History:  Procedure Laterality Date  . ABDOMINAL HYSTERECTOMY  1980s  . BIOPSY  03/18/2019   Procedure: BIOPSY;  Surgeon:  Mansouraty, Telford Nab., MD;  Location: [place];  Service: Gastroenterology;;  . Lorin Mercy  01/06/09  . COLONOSCOPY  05/09/2012   Procedure: COLONOSCOPY;  Surgeon: Inda Castle, MD;  Location: [place];  Service: Endoscopy;  Laterality: N/A;  . COLONOSCOPY WITH PROPOFOL N/A 03/18/2019   Procedure: COLONOSCOPY WITH PROPOFOL;  Surgeon: Rush Landmark Telford Nab., MD;  Location: [place];  Service: Gastroenterology;  Laterality: N/A;  . ENTEROSCOPY N/A 03/18/2019   Procedure:  ENTEROSCOPY;  Surgeon: Rush Landmark Telford Nab., MD;  Location: [place];  Service: Gastroenterology;  Laterality: N/A;  . ESOPHAGOGASTRODUODENOSCOPY  05/09/2012   Procedure: ESOPHAGOGASTRODUODENOSCOPY (EGD);  Surgeon: Inda Castle, MD;  Location: [place];  Service: Endoscopy;  Laterality: N/A;  . HEMOSTASIS CLIP PLACEMENT  03/18/2019   Procedure: HEMOSTASIS CLIP PLACEMENT;  Surgeon: Irving Copas., MD;  Location: [place];  Service: Gastroenterology;;  . HOT HEMOSTASIS N/A 03/18/2019   Procedure: HOT HEMOSTASIS (ARGON PLASMA COAGULATION/BICAP);  Surgeon: Irving Copas., MD;  Location: [place];  Service: Gastroenterology;  Laterality: N/A;  . POLYPECTOMY  03/18/2019   Procedure: POLYPECTOMY;  Surgeon: Rush Landmark Telford Nab., MD;  Location: [place];  Service: Gastroenterology;;  . [place] INJECTION  03/18/2019   Procedure: SUBMUCOSAL TATTOO INJECTION;  Surgeon: Irving Copas., MD;  Location: [place];  Service: Gastroenterology;;     OB History   No obstetric history on file.     Family History  Problem Relation Age of Onset  . Hypertension Mother   . Colon cancer Neg Hx   . Esophageal cancer Neg Hx   . Inflammatory bowel disease Neg Hx   . Liver disease Neg Hx   . Pancreatic cancer Neg Hx   . Rectal cancer Neg Hx   . Stomach cancer Neg Hx     Social History   Tobacco Use  . Smoking status: Former Smoker    Types: Cigarettes    Quit date: 08/21/1995    Years since quitting: 24.0  . Smokeless tobacco: Never Used  Substance Use Topics  . Alcohol use: No    Comment: in remission since 2007  . Drug use: No    Home Medications Prior to Admission medications   Medication Sig Start Date End Date Taking? Authorizing Provider  amitriptyline (ELAVIL) 25 MG tablet TAKE 1 TABLET BY MOUTH AT BEDTIME Patient taking differently: Take 25 mg by mouth at bedtime.  06/24/19   Seawell, Jaimie A, DO  baclofen (LIORESAL) 10 MG  tablet Take 1 tablet (10 mg total) by mouth 2 (two) times daily. 08/04/19 08/03/20  Kathi Ludwig, MD  carbamazepine (TEGRETOL) 200 MG tablet Take 0.5 tablets (100 mg total) by mouth in the morning and at bedtime. 08/29/19   Quintella Reichert, MD  cefdinir (OMNICEF) 300 MG capsule Take 1 capsule (300 mg total) by mouth 2 (two) times daily. For ten days. 08/06/19   Kathi Ludwig, MD  cetirizine (ZYRTEC) 10 MG tablet Take 1 tablet (10 mg total) by mouth daily. 08/27/19   Ladona Horns, MD  Ferrous Gluconate 256 (28 Fe) MG TABS Take 256 mg by mouth daily. Patient not taking: Reported on 08/14/2019 05/25/19   Kathi Ludwig, MD  ferrous sulfate 324 MG TBEC Take 324 mg by mouth.    [provider]  FLOVENT HFA 110 MCG/ACT inhaler Inhale 2 puffs by mouth twice daily 07/01/19   Bartholomew Crews, MD  fluticasone [facility]) 50 MCG/ACT nasal spray Place 2 sprays into both nostrils daily. 08/27/19   Ladona Horns, MD  hydrochlorothiazide (HYDRODIURIL) 12.5 MG tablet Take 1 tablet (12.5 mg total) by mouth daily. Patient not taking: Reported on 08/14/2019 08/07/19   Kathi Ludwig, MD  iron polysaccharides (NIFEREX) 150 MG capsule Take 150 mg by mouth daily.    [provider]  iron polysaccharides (NU-IRON) 150 MG capsule Take 1 capsule (150 mg total) by mouth daily. Patient not taking: Reported on 08/14/2019 12/15/18   Molli Hazard A, DO  losartan (COZAAR) 25 MG tablet Take 1 tablet (25 mg total) by mouth daily. Patient not taking: Reported on 08/14/2019 08/07/19   Kathi Ludwig, MD  nitrofurantoin, macrocrystal-monohydrate, (MACROBID) 100 MG capsule Take 1 capsule (100 mg total) by mouth 2 (two) times daily. Patient not taking: Reported on 08/14/2019 05/22/19   Corena Herter, PA-C  pantoprazole (PROTONIX) 40 MG tablet Take 1 tablet (40 mg total) by mouth daily. 08/24/19   Mansouraty, Telford Nab., MD  phenazopyridine (PYRIDIUM) 200 MG tablet Take 1 tablet (200 mg total) by mouth  3 (three) times daily. Patient not taking: Reported on 08/14/2019 05/22/19   Corena Herter, PA-C  pravastatin (PRAVACHOL) 40 MG tablet TAKE 1 TABLET BY MOUTH ONCE DAILY IN THE EVENING 08/18/19   Seawell, Jaimie A, DO    Allergies    Banana, Grape (artificial) flavor, Ibuprofen, and Penicillins  Review of Systems   Review of Systems  All other systems reviewed and are negative.   Physical Exam Updated Vital Signs BP 127/73 (BP Location: Right Arm)   Pulse (!) 109   Temp 98.3 F (36.8 C) (Oral)   Resp 18   Ht 5\' 4"  (1.626 m)   Wt 46.3 kg   LMP 08/21/1982   SpO2 100%   BMI 17.51 kg/m   Physical Exam Vitals and nursing note reviewed.  Constitutional:      Appearance: She is well-developed.  HENT:     Head: Normocephalic and atraumatic.     Comments: TMs clear bilaterally without any significant effusion or erythema. No erythema or edema in the posterior oropharynx. Edentiulous with dentures in place. No facial tenderness to palpation Neck:     Comments: One isolated anterior cervical lymph node on the right that is approximately 1 cm in size and mobile. No neck tenderness to palpation. No thyromegaly. Cardiovascular:     Rate and Rhythm: Normal rate and regular rhythm.     Heart sounds: No murmur.  Pulmonary:     Effort: Pulmonary effort is normal. No respiratory distress.     Breath sounds: Normal breath sounds.  Abdominal:     Palpations: Abdomen is soft.     Tenderness: There is no abdominal tenderness. There is no guarding or rebound.  Musculoskeletal:        General: No tenderness.     Cervical back: Neck supple.  Skin:    General: Skin is warm and dry.  Neurological:     Mental Status: She is alert and oriented to person, place, and time.     Comments: Visual fields are grossly intact. EOM I. No asymmetry of facial movements. Five out of five strength in all four extremities with sensation to light touch intact in all four extremities  Psychiatric:         Behavior: Behavior normal.     ED Results / Procedures / Treatments   Labs (all labs ordered are listed, but only abnormal results are displayed) Labs Reviewed - No data to display  EKG None  Radiology No results found.  Procedures Procedures (including  critical care time)  Medications Ordered in ED Medications - No data to display  ED Course  I have reviewed the triage vital signs and the nursing notes.  Pertinent labs & imaging results that were available during my care of the patient were reviewed by me and considered in my medical decision making (see chart for details).    MDM Rules/Calculators/A&P                     Patient here for evaluation of acute on chronic left ear pain. She is non-toxic appearing on evaluation with no focal neurologic deficits. There is no evidence of acute infectious process. Presentation is not consistent with giant cell arteritis. Discussed with patient importance of specialist follow-up for further evaluation of her symptoms. Will start empiric treatment for possible trigeminal neuralgia pending her outpatient follow-up.  Records reviewed from recent ED visit with negative CT head performed at that time.  Final Clinical Impression(s) / ED Diagnoses Final diagnoses:  Otalgia of left ear    Rx / DC Orders ED Discharge Orders         Ordered    carbamazepine (TEGRETOL) 200 MG tablet  2 times daily     08/29/19 1538           Quintella Reichert, MD 08/29/19 1539

## 2019-08-29 NOTE — ED Triage Notes (Signed)
Onset "a while" bilateral ear pain, L>R, left jaw, and headache.

## 2019-08-29 NOTE — ED Notes (Signed)
Patient verbalizes understanding of discharge instructions. Opportunity for questioning and answers were provided. Armband removed by staff, pt discharged from ED. Pt able to ambulate to vehicle to leave with family.

## 2019-08-31 ENCOUNTER — Ambulatory Visit (HOSPITAL_COMMUNITY): Admission: RE | Admit: 2019-08-31 | Payer: Medicare Other | Source: Ambulatory Visit

## 2019-09-04 ENCOUNTER — Encounter (HOSPITAL_COMMUNITY): Payer: Self-pay | Admitting: Emergency Medicine

## 2019-09-04 ENCOUNTER — Emergency Department (HOSPITAL_COMMUNITY)
Admission: EM | Admit: 2019-09-04 | Discharge: 2019-09-04 | Disposition: A | Discharge status: Home / Self Care | Payer: Medicare Other | Attending: Emergency Medicine | Admitting: Emergency Medicine

## 2019-09-04 ENCOUNTER — Other Ambulatory Visit: Payer: Self-pay

## 2019-09-04 DIAGNOSIS — R5381 Other malaise: Secondary | ICD-10-CM

## 2019-09-04 DIAGNOSIS — Z79899 Other long term (current) drug therapy: Secondary | ICD-10-CM | POA: Insufficient documentation

## 2019-09-04 DIAGNOSIS — R531 Weakness: Secondary | ICD-10-CM | POA: Diagnosis not present

## 2019-09-04 DIAGNOSIS — I10 Essential (primary) hypertension: Secondary | ICD-10-CM | POA: Diagnosis not present

## 2019-09-04 LAB — CBC
HCT: 40.7 % (ref 36.0–46.0)
Hemoglobin: 12.8 g/dL (ref 12.0–15.0)
MCH: 30.3 pg (ref 26.0–34.0)
MCHC: 31.4 g/dL (ref 30.0–36.0)
MCV: 96.4 fL (ref 80.0–100.0)
Platelets: 321 10*3/uL (ref 150–400)
RBC: 4.22 MIL/uL (ref 3.87–5.11)
RDW: 12.3 % (ref 11.5–15.5)
WBC: 5.2 10*3/uL (ref 4.0–10.5)
nRBC: 0 % (ref 0.0–0.2)

## 2019-09-04 LAB — BASIC METABOLIC PANEL
Anion gap: 8 (ref 5–15)
BUN: 12 mg/dL (ref 8–23)
CO2: 29 mmol/L (ref 22–32)
Calcium: 9.4 mg/dL (ref 8.9–10.3)
Chloride: 103 mmol/L (ref 98–111)
Creatinine, Ser: 0.89 mg/dL (ref 0.44–1.00)
GFR calc Af Amer: >60 mL/min (ref >60)
GFR calc non Af Amer: >60 mL/min (ref >60)
Glucose, Bld: 103 mg/dL — ABNORMAL HIGH (ref 70–99)
Potassium: 3.6 mmol/L (ref 3.5–5.1)
Sodium: 140 mmol/L (ref 135–145)

## 2019-09-04 LAB — CBG MONITORING, ED: Glucose-Capillary: 76 mg/dL (ref 70–99)

## 2019-09-04 MED ORDER — SODIUM CHLORIDE 0.9% FLUSH: 3.0000 mL | Freq: Once | INTRAVENOUS | Status: DC

## 2019-09-04 NOTE — ED Provider Notes (Signed)
[facility] [facility] EMERGENCY DEPARTMENT Provider Note   CSN: [id number] Arrival date & time: 09/04/19  1544     History Chief Complaint  Patient presents with  . Weakness    Kimberly [name] is a 64 y.o. female.  HPI Patient is somewhat difficult to focus in the exact reason why she is presenting to the emergency department today.  As I entered the room for first evaluation, patient was on the phone and advised that she is calling for her ride.  Reports that she is ready to go.  We did fortunately continue to have some conversation.  She reports she knows that some of her symptoms are from her eyes.  She reports she is seeing the eye doctor and been told that that is the case.  She does not actually have any specific complaints regarding her eyes.  She denies she has any headache or problems with the vision at this time.  She reports she is also concerned because her doctor decreased her hydrochlorothiazide in half but added a second blood pressure medication.  She reports she never needed to blood pressure medications in her life.  Patient does not endorse any areas of pain.  She denies chest pain, abdominal pain or any other focus of pain.  She denies she feels short of breath.  She reports she is going to stop a couple new medications that were just recently given to her.  She reports her not making her feel right but cannot qualify and what way.  She shows me her medications and it is carbamazepine and baclofen that she thinks are not agreeing with her.  She also describes some issues with not feeling right since December.  She reports she was hospitalized at that time and treated for infections and some things apparently never cleared up.    Past Medical History:  Diagnosis Date  . Alcohol abuse, in remission    since around 2005  . Alcoholic hepatitis    fatty liver on imaging- Treated  . Allergic rhinitis   . Anemia    EGD with duodenal AVM, normal colonoscopy 04/2012  .  Asthma   . Callus of foot 2009  . Chest pain, musculoskeletal 2011  . Cholelithiasis    [place] 09/2008: Cholelithiasis is present, s/p cholecystectomy  . Depression   . Excessive cerumen in ear canal    since before 2000  . GERD (gastroesophageal reflux disease)   . History of blood transfusion   . Hyperlipidemia   . Hypertension   . Menopausal syndrome    Treated with estradiol in the past - discontinued 04/2012  . MENOPAUSAL SYNDROME 09/24/2006   2008 Note : The pt is adament about having this medication.  She fully understands the increased risk of heart disease and cancer that is associated with HRT and is willing to accept this risk in order to prevent the debilitating hot flashes she has off this medication.  Will continue to encourage her to try to wean herself off of this medication at our next visit.  2009 note indicated that she tr  . Otitis externa, acute Feb 2012   bilateral, treated with azithromycin after failure of neomycin drops  . Otitis media, acute Feb 2012  . PONV (postoperative nausea and vomiting)     Patient Active Problem List   Diagnosis Date Noted  . Rhinosinusitis 08/27/2019  . Acute right-sided low back pain without sciatica 08/04/2019  . Dysuria 05/25/2019  . Tachycardia 05/25/2019  .  Hematuria 05/25/2019  . Ear pain, bilateral 04/27/2019  . 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 Vaginal discharge 01/18/2019  . Abdominal gas pain 12/16/2018  . Microcytic anemia 11/28/2018  . Pain in right thigh 01/15/2018  . Health care maintenance 03/25/2013  . Depression 12/03/2012  . Iron deficiency anemia 05/08/2012  . HLD (hyperlipidemia) 03/25/2008  . Constipation 11/04/2007  . Essential hypertension 09/24/2006  . Allergies 09/24/2006  . Asthma 09/24/2006  . GERD 09/24/2006    Past Surgical History:  Procedure Laterality Date  . ABDOMINAL HYSTERECTOMY  1980s  . BIOPSY  03/18/2019   Procedure: BIOPSY;  Surgeon: Irving Copas., MD;  Location: [place];  Service: Gastroenterology;;  . Lorin Mercy  01/06/09  . COLONOSCOPY  05/09/2012   Procedure: COLONOSCOPY;  Surgeon: Inda Castle, MD;  Location: [place];  Service: Endoscopy;  Laterality: N/A;  . COLONOSCOPY WITH PROPOFOL N/A 03/18/2019   Procedure: COLONOSCOPY WITH PROPOFOL;  Surgeon: Rush Landmark Telford Nab., MD;  Location: [place];  Service: Gastroenterology;  Laterality: N/A;  . ENTEROSCOPY N/A 03/18/2019   Procedure: ENTEROSCOPY;  Surgeon: Rush Landmark Telford Nab., MD;  Location: [place];  Service: Gastroenterology;  Laterality: N/A;  . ESOPHAGOGASTRODUODENOSCOPY  05/09/2012   Procedure: ESOPHAGOGASTRODUODENOSCOPY (EGD);  Surgeon: Inda Castle, MD;  Location: [place];  Service: Endoscopy;  Laterality: N/A;  . HEMOSTASIS CLIP PLACEMENT  03/18/2019   Procedure: HEMOSTASIS CLIP PLACEMENT;  Surgeon: Irving Copas., MD;  Location: [place];  Service: Gastroenterology;;  . HOT HEMOSTASIS N/A 03/18/2019   Procedure: HOT HEMOSTASIS (ARGON PLASMA COAGULATION/BICAP);  Surgeon: Irving Copas., MD;  Location: [place];  Service: Gastroenterology;  Laterality: N/A;  . POLYPECTOMY  03/18/2019   Procedure: POLYPECTOMY;  Surgeon: Rush Landmark Telford Nab., MD;  Location: [place];  Service: Gastroenterology;;  . [place] INJECTION  03/18/2019   Procedure: SUBMUCOSAL TATTOO INJECTION;  Surgeon: Irving Copas., MD;  Location: [place];  Service: Gastroenterology;;     OB History   No obstetric history on file.     Family History  Problem Relation Age of Onset  . Hypertension Mother   . Colon cancer Neg Hx   . Esophageal cancer Neg Hx   . Inflammatory bowel disease Neg Hx   . Liver disease Neg Hx   . Pancreatic cancer Neg Hx   . Rectal cancer Neg Hx   . Stomach cancer Neg Hx     Social History   Tobacco Use  . Smoking status: Former Smoker    Types: Cigarettes    Quit  date: 08/21/1995    Years since quitting: 24.0  . Smokeless tobacco: Never Used  Substance Use Topics  . Alcohol use: No    Comment: in remission since 2007  . Drug use: No    Home Medications Prior to Admission medications   Medication Sig Start Date End Date Taking? Authorizing Provider  amitriptyline (ELAVIL) 25 MG tablet TAKE 1 TABLET BY MOUTH AT BEDTIME Patient taking differently: Take 25 mg by mouth at bedtime.  06/24/19   Seawell, Jaimie A, DO  baclofen (LIORESAL) 10 MG tablet Take 1 tablet (10 mg total) by mouth 2 (two) times daily. 08/04/19 08/03/20  Kathi Ludwig, MD  carbamazepine (TEGRETOL) 200 MG tablet Take 0.5 tablets (100 mg total) by mouth in the morning and at bedtime. 08/29/19   Quintella Reichert, MD  cefdinir (OMNICEF) 300 MG capsule Take 1 capsule (300 mg total) by mouth 2 (two) times daily. For ten days.  08/06/19   Kathi Ludwig, MD  cetirizine (ZYRTEC) 10 MG tablet Take 1 tablet (10 mg total) by mouth daily. 08/27/19   Ladona Horns, MD  Ferrous Gluconate 256 (28 Fe) MG TABS Take 256 mg by mouth daily. Patient not taking: Reported on 08/14/2019 05/25/19   Kathi Ludwig, MD  ferrous sulfate 324 MG TBEC Take 324 mg by mouth.    [provider]  FLOVENT HFA 110 MCG/ACT inhaler Inhale 2 puffs by mouth twice daily 07/01/19   Bartholomew Crews, MD  fluticasone [facility]) 50 MCG/ACT nasal spray Place 2 sprays into both nostrils daily. 08/27/19   Ladona Horns, MD  hydrochlorothiazide (HYDRODIURIL) 12.5 MG tablet Take 1 tablet (12.5 mg total) by mouth daily. Patient not taking: Reported on 08/14/2019 08/07/19   Kathi Ludwig, MD  iron polysaccharides (NIFEREX) 150 MG capsule Take 150 mg by mouth daily.    [provider]  iron polysaccharides (NU-IRON) 150 MG capsule Take 1 capsule (150 mg total) by mouth daily. Patient not taking: Reported on 08/14/2019 12/15/18   Molli Hazard A, DO  losartan (COZAAR) 25 MG tablet Take 1 tablet (25 mg total) by mouth  daily. Patient not taking: Reported on 08/14/2019 08/07/19   Kathi Ludwig, MD  nitrofurantoin, macrocrystal-monohydrate, (MACROBID) 100 MG capsule Take 1 capsule (100 mg total) by mouth 2 (two) times daily. Patient not taking: Reported on 08/14/2019 05/22/19   Corena Herter, PA-C  pantoprazole (PROTONIX) 40 MG tablet Take 1 tablet (40 mg total) by mouth daily. 08/24/19   Mansouraty, Telford Nab., MD  phenazopyridine (PYRIDIUM) 200 MG tablet Take 1 tablet (200 mg total) by mouth 3 (three) times daily. Patient not taking: Reported on 08/14/2019 05/22/19   Corena Herter, PA-C  pravastatin (PRAVACHOL) 40 MG tablet TAKE 1 TABLET BY MOUTH ONCE DAILY IN THE EVENING 08/18/19   Seawell, Jaimie A, DO    Allergies    Banana, Grape (artificial) flavor, Ibuprofen, and Penicillins  Review of Systems   Review of Systems 10 Systems reviewed and are negative for acute change except as noted in the HPI.  Physical Exam Updated Vital Signs BP 138/81 (BP Location: Right Arm)   Pulse 61   Temp 98.3 F (36.8 C) (Oral)   Resp 16   Ht 5\' 4"  (1.626 m)   Wt 46.3 kg   LMP 08/21/1982   SpO2 100%   BMI 17.51 kg/m   Physical Exam Constitutional:      Comments: Patient is alert and nontoxic.  She is sitting in a chair.  Clinically she is well in appearance.  She is very thin but well maintained.  HENT:     Head: Normocephalic and atraumatic.     Nose: Nose normal.     Mouth/Throat:     Mouth: Mucous membranes are moist.     Pharynx: Oropharynx is clear.  Eyes:     Extraocular Movements: Extraocular movements intact.     Pupils: Pupils are equal, round, and reactive to light.  Cardiovascular:     Rate and Rhythm: Normal rate and regular rhythm.     Pulses: Normal pulses.     Heart sounds: Normal heart sounds.  Pulmonary:     Effort: Pulmonary effort is normal.     Breath sounds: Normal breath sounds.  Abdominal:     General: There is no distension.     Palpations: Abdomen is soft.      Tenderness: There is no abdominal tenderness. There is no guarding.  Musculoskeletal:  General: No swelling or tenderness. Normal range of motion.     Cervical back: Neck supple.     Right lower leg: No edema.     Left lower leg: No edema.  Skin:    General: Skin is warm and dry.  Neurological:     General: No focal deficit present.     Mental Status: She is oriented to person, place, and time.     Cranial Nerves: No cranial nerve deficit.     Coordination: Coordination normal.     Gait: Gait normal.  Psychiatric:        Mood and Affect: Mood normal.     Comments: Mood is cooperative.  Patient's history is somewhat disorganized but she is situationally oriented.     ED Results / Procedures / Treatments   Labs (all labs ordered are listed, but only abnormal results are displayed) Labs Reviewed  BASIC METABOLIC PANEL - Abnormal; Notable for the following components:      Result Value   Glucose, Bld 103 (*)    All other components within normal limits  CBC  URINALYSIS, ROUTINE W REFLEX MICROSCOPIC  CBG MONITORING, ED    EKG EKG Interpretation  Date/Time:  Friday September 04 2019 15:50:58 EDT Ventricular Rate:  80 PR Interval:  128 QRS Duration: 90 QT Interval:  382 QTC Calculation: 440 R Axis:   79 Text Interpretation: Normal sinus rhythm no sig change from previous Confirmed by Charlesetta Shanks [phone]) on 09/04/2019 8:14:35 PM   Radiology No results found.  Procedures Procedures (including critical care time)  Medications Ordered in ED Medications  sodium chloride flush (NS) 0.9 % injection 3 mL (has no administration in time range)    ED Course  I have reviewed the triage vital signs and the nursing notes.  Pertinent labs & imaging results that were available during my care of the patient were reviewed by me and considered in my medical decision making (see chart for details).    MDM Rules/Calculators/A&P                      Patient presents as  outlined.  No specific focus of symptoms.  Patient feels that carbamazepine and baclofen are making her feel weird.  I advised is okay for her to discontinue these medications.  They were started for symptomatic treatment and not for seizure control or other significant spasticity.  Patient's labs are within normal limits.  She is clinically well in appearance.  She has no focal pain complaints and does not appear to have any infectious issues at this time.  She is counseled to continue her prescribed blood pressure medications although she seems dismayed that a second medication has been added.  Blood pressure is normal and heart rate regular.  She is counseled to follow-up with her doctor this week to review her medications and concerns.  Return precautions reviewed. Final Clinical Impression(s) / ED Diagnoses Final diagnoses:  Malaise    Rx / DC Orders ED Discharge Orders    None       Charlesetta Shanks, MD 09/04/19 2038

## 2019-09-04 NOTE — Discharge Instructions (Addendum)
1.  You may discontinue carbamazepine and baclofen if you feel that you are getting a bad reaction to them. 2.  Continue all of your blood pressure medications. 3.  See your doctor for recheck and review your medications within the next week. 4.  Return to the emergency department if you have any worsening or concerning symptoms.

## 2019-09-04 NOTE — ED Notes (Signed)
Pt verbalized frustration w/ wait.  RN attempted to address pts needs however she stated "I'm just here for my results."

## 2019-09-04 NOTE — ED Triage Notes (Addendum)
Pt to triage via [facility].  Reports generalized weakness x 1 month.  Felt symptoms were worse today unsure if related to marijuana she smoked today. No arm drift.

## 2019-09-07 ENCOUNTER — Ambulatory Visit (INDEPENDENT_AMBULATORY_CARE_PROVIDER_SITE_OTHER): Payer: Medicare Other | Admitting: Internal Medicine

## 2019-09-07 ENCOUNTER — Other Ambulatory Visit: Payer: Self-pay

## 2019-09-07 ENCOUNTER — Encounter: Payer: Self-pay | Admitting: Internal Medicine

## 2019-09-07 DIAGNOSIS — J310 Chronic rhinitis: Secondary | ICD-10-CM | POA: Diagnosis present

## 2019-09-07 DIAGNOSIS — M791 Myalgia, unspecified site: Secondary | ICD-10-CM

## 2019-09-07 DIAGNOSIS — J329 Chronic sinusitis, unspecified: Secondary | ICD-10-CM | POA: Diagnosis not present

## 2019-09-07 NOTE — Assessment & Plan Note (Addendum)
During our telehealth appointment patient mentions having "body pain" and malaise. She states that her symptoms have been occurring for a week. She states she recently went to the [facility] on 09/04/2019 for these symptoms but nothing was found. She endorses overnight diarrhea, diaphoresis, chills, and subjective fevers over the last 3 days. She denies nausea, headaches, vomiting, chest pain, abdominal pain, or sick contacts. She asks for medications to halt her body pain.   Patient with recent evaluation in the ED with an unremarkable physical exam and initial blood work with complaints of myalgias, malaise, diaphoresis, subjective fevers, new onset diarrhea, asking for medications. Due to presentation her symptoms could be likely viral infection vs endocrine.  Plan:  - Patient advised to make an appointment for further workup

## 2019-09-07 NOTE — Assessment & Plan Note (Addendum)
Patient with complaints of pressure behind "my face" with worsening L sided ear pain. Patient states that it is constant in duration and has been present for 2-3 weeks. She denies nausea, vomiting, neck stiffness, headaches, visual changes, or sharp facial pains. She had a telehealth appointment on 08/27/2019, she was prescribed Flonase and cetrizine for rhinosinusitis likely 2/2 to seasonal allergies. Patient has stopped the cetirizine due to it making her feel "different" but cannot describe how it makes her feel different. She is taking her Flonase correctly, but is concerned that it is not effective.   Of note, L ear pain has been a chronic symptom, patient was recently evaluated on 08/29/2019 for her ear pain with an unremarkable ear examination with visual fields intact bilaterally.   Plan:  - Patient to continue using flonase - Stressed the importance of sinus irrigation and instructed patient to utilize a neti pot or other sinus irrigation product. Patient voiced understanding.  - Instructed to come to the office if symptoms continue.

## 2019-09-07 NOTE — Progress Notes (Signed)
  [facility] Internal Medicine Residency Telephone Encounter Continuity Care Appointment  HPI:   This telephone encounter was created for [name] on 09/07/2019 for the following purpose/cc sinuses and body aches/malaise.   Past Medical History:  Past Medical History:  Diagnosis Date  . Alcohol abuse, in remission    since around 2005  . Alcoholic hepatitis    fatty liver on imaging- Treated  . Allergic rhinitis   . Anemia    EGD with duodenal AVM, normal colonoscopy 04/2012  . Asthma   . Callus of foot 2009  . Chest pain, musculoskeletal 2011  . Cholelithiasis    [place] 09/2008: Cholelithiasis is present, s/p cholecystectomy  . Depression   . Excessive cerumen in ear canal    since before 2000  . GERD (gastroesophageal reflux disease)   . History of blood transfusion   . Hyperlipidemia   . Hypertension   . Menopausal syndrome    Treated with estradiol in the past - discontinued 04/2012  . MENOPAUSAL SYNDROME 09/24/2006   2008 Note : The pt is adament about having this medication.  She fully understands the increased risk of heart disease and cancer that is associated with HRT and is willing to accept this risk in order to prevent the debilitating hot flashes she has off this medication.  Will continue to encourage her to try to wean herself off of this medication at our next visit.  2009 note indicated that she tr  . Otitis externa, acute Feb 2012   bilateral, treated with azithromycin after failure of neomycin drops  . Otitis media, acute Feb 2012  . PONV (postoperative nausea and vomiting)       ROS:  Review of Systems  Constitutional: Positive for chills, diaphoresis, fever and malaise/fatigue.       Complaints of long term malaise. Endorses fevers, diaphoresis, chills over the last 72 hours.   HENT: Positive for congestion and sinus pain.   Eyes: Positive for redness. Negative for blurred vision, double vision, photophobia and pain.  Respiratory: Negative for  cough and shortness of breath.   Cardiovascular: Negative for chest pain and palpitations.  Gastrointestinal: Positive for diarrhea. Negative for abdominal pain, nausea and vomiting.       Overnight diarrhea.  Musculoskeletal: Positive for myalgias. Negative for back pain, joint pain and neck pain.  Neurological: Negative for dizziness and headaches.     Assessment / Plan / Recommendations:   Please see A&P under problem oriented charting for assessment of the patient's acute and chronic medical conditions.   As always, pt is advised that if symptoms worsen or new symptoms arise, they should go to an urgent care facility or to to ER for further evaluation.   Consent and Medical Decision Making:   Patient discussed with Dr. Rebeca Alert  This is a telephone encounter between Kimberly [name] and [name] on 09/07/2019 for Sinuses and body aches. The visit was conducted with the patient located at home and [name] at [facility]. The patient's identity was confirmed using their DOB and current address. The patient has consented to being evaluated through a telephone encounter and understands the associated risks (an examination cannot be done and the patient may need to come in for an appointment) / benefits (allows the patient to remain at home, decreasing exposure to coronavirus). I personally spent 10 minutes on medical discussion.

## 2019-09-08 ENCOUNTER — Encounter: Payer: Self-pay | Admitting: Internal Medicine

## 2019-09-08 ENCOUNTER — Ambulatory Visit (INDEPENDENT_AMBULATORY_CARE_PROVIDER_SITE_OTHER): Payer: Medicare Other | Admitting: Internal Medicine

## 2019-09-08 DIAGNOSIS — J310 Chronic rhinitis: Secondary | ICD-10-CM

## 2019-09-08 DIAGNOSIS — Z87891 Personal history of nicotine dependence: Secondary | ICD-10-CM | POA: Diagnosis not present

## 2019-09-08 DIAGNOSIS — J329 Chronic sinusitis, unspecified: Secondary | ICD-10-CM

## 2019-09-08 MED ORDER — LEVOCETIRIZINE DIHYDROCHLORIDE 5 MG PO TABS: 5.0000 mg | ORAL_TABLET | Freq: Every evening | ORAL | 2 refills | Status: DC

## 2019-09-08 NOTE — Progress Notes (Signed)
Acute Office Visit  Subjective:    Patient ID: Kimberly [name], female    DOB: [birth year], 64 y.o.   MRN: [id number]  Chief Complaint  Patient presents with  . Follow-up    BODY ACHE X 1 WEEK    HPI Patient is in today for general malaise, congestion. Please see problem based charting for further details.   Past Medical History:  Diagnosis Date  . Alcohol abuse, in remission    since around 2005  . Alcoholic hepatitis    fatty liver on imaging- Treated  . Allergic rhinitis   . Anemia    EGD with duodenal AVM, normal colonoscopy 04/2012  . Asthma   . Callus of foot 2009  . Chest pain, musculoskeletal 2011  . Cholelithiasis    [place] 09/2008: Cholelithiasis is present, s/p cholecystectomy  . Depression   . Excessive cerumen in ear canal    since before 2000  . GERD (gastroesophageal reflux disease)   . History of blood transfusion   . Hyperlipidemia   . Hypertension   . Menopausal syndrome    Treated with estradiol in the past - discontinued 04/2012  . MENOPAUSAL SYNDROME 09/24/2006   2008 Note : The pt is adament about having this medication.  She fully understands the increased risk of heart disease and cancer that is associated with HRT and is willing to accept this risk in order to prevent the debilitating hot flashes she has off this medication.  Will continue to encourage her to try to wean herself off of this medication at our next visit.  2009 note indicated that she tr  . Otitis externa, acute Feb 2012   bilateral, treated with azithromycin after failure of neomycin drops  . Otitis media, acute Feb 2012  . PONV (postoperative nausea and vomiting)     Past Surgical History:  Procedure Laterality Date  . ABDOMINAL HYSTERECTOMY  1980s  . BIOPSY  03/18/2019   Procedure: BIOPSY;  Surgeon: Lemar Lofty., MD;  Location: [facility] ENDOSCOPY;  Service: Gastroenterology;;  . [facility]  01/06/09  . COLONOSCOPY  05/09/2012   Procedure: COLONOSCOPY;  Surgeon: Louis Meckel, MD;  Location: [facility] ENDOSCOPY;  Service: Endoscopy;  Laterality: N/A;  . COLONOSCOPY WITH PROPOFOL N/A 03/18/2019   Procedure: COLONOSCOPY WITH PROPOFOL;  Surgeon: Meridee Score Netty Starring., MD;  Location: [facility] ENDOSCOPY;  Service: Gastroenterology;  Laterality: N/A;  . ENTEROSCOPY N/A 03/18/2019   Procedure: ENTEROSCOPY;  Surgeon: Meridee Score Netty Starring., MD;  Location: [facility] ENDOSCOPY;  Service: Gastroenterology;  Laterality: N/A;  . ESOPHAGOGASTRODUODENOSCOPY  05/09/2012   Procedure: ESOPHAGOGASTRODUODENOSCOPY (EGD);  Surgeon: Louis Meckel, MD;  Location: [facility] ENDOSCOPY;  Service: Endoscopy;  Laterality: N/A;  . HEMOSTASIS CLIP PLACEMENT  03/18/2019   Procedure: HEMOSTASIS CLIP PLACEMENT;  Surgeon: Lemar Lofty., MD;  Location: [facility] ENDOSCOPY;  Service: Gastroenterology;;  . HOT HEMOSTASIS N/A 03/18/2019   Procedure: HOT HEMOSTASIS (ARGON PLASMA COAGULATION/BICAP);  Surgeon: Lemar Lofty., MD;  Location: [facility] ENDOSCOPY;  Service: Gastroenterology;  Laterality: N/A;  . POLYPECTOMY  03/18/2019   Procedure: POLYPECTOMY;  Surgeon: Meridee Score Netty Starring., MD;  Location: [facility] ENDOSCOPY;  Service: Gastroenterology;;  . Sunnie Nielsen TATTOO INJECTION  03/18/2019   Procedure: SUBMUCOSAL TATTOO INJECTION;  Surgeon: Lemar Lofty., MD;  Location: [facility] ENDOSCOPY;  Service: Gastroenterology;;    Family History  Problem Relation Age of Onset  . Hypertension Mother   . Colon cancer Neg Hx   . Esophageal cancer Neg Hx   . Inflammatory bowel disease  Neg Hx   . Liver disease Neg Hx   . Pancreatic cancer Neg Hx   . Rectal cancer Neg Hx   . Stomach cancer Neg Hx     Social History   Socioeconomic History  . Marital status: Single    Spouse name: Not on file  . Number of children: Not on file  . Years of education: Not on file  . Highest education level: Not on file  Occupational History  . Not on file  Tobacco Use  . Smoking status: Former Smoker    Types: Cigarettes     Quit date: 08/21/1995    Years since quitting: 24.0  . Smokeless tobacco: Never Used  Substance and Sexual Activity  . Alcohol use: No    Comment: in remission since 2007  . Drug use: Yes    Types: Marijuana  . Sexual activity: Never  Other Topics Concern  . Not on file  Social History Narrative   2   Social Determinants of Health   Financial Resource Strain:   . Difficulty of Paying Living Expenses:   Food Insecurity:   . Worried About Programme researcher, broadcasting/film/video in the Last Year:   . Barista in the Last Year:   Transportation Needs:   . Freight forwarder (Medical):   Marland Kitchen Lack of Transportation (Non-Medical):   Physical Activity:   . Days of Exercise per Week:   . Minutes of Exercise per Session:   Stress:   . Feeling of Stress :   Social Connections:   . Frequency of Communication with Friends and Family:   . Frequency of Social Gatherings with Friends and Family:   . Attends Religious Services:   . Active Member of Clubs or Organizations:   . Attends Banker Meetings:   Marland Kitchen Marital Status:   Intimate Partner Violence:   . Fear of Current or Ex-Partner:   . Emotionally Abused:   Marland Kitchen Physically Abused:   . Sexually Abused:     Outpatient Medications Prior to Visit  Medication Sig Dispense Refill  . amitriptyline (ELAVIL) 25 MG tablet TAKE 1 TABLET BY MOUTH AT BEDTIME (Patient taking differently: Take 25 mg by mouth at bedtime. ) 90 tablet 0  . baclofen (LIORESAL) 10 MG tablet Take 1 tablet (10 mg total) by mouth 2 (two) times daily. 60 tablet 1  . carbamazepine (TEGRETOL) 200 MG tablet Take 0.5 tablets (100 mg total) by mouth in the morning and at bedtime. 15 tablet 0  . cefdinir (OMNICEF) 300 MG capsule Take 1 capsule (300 mg total) by mouth 2 (two) times daily. For ten days. 20 capsule 0  . Ferrous Gluconate 256 (28 Fe) MG TABS Take 256 mg by mouth daily. (Patient not taking: Reported on 08/14/2019) 30 tablet 2  . ferrous sulfate 324 MG TBEC Take 324 mg  by mouth.    Haywood Pao HFA 110 MCG/ACT inhaler Inhale 2 puffs by mouth twice daily 36 g 1  . fluticasone (FLONASE) 50 MCG/ACT nasal spray Place 2 sprays into both nostrils daily. 15.8 mL 1  . hydrochlorothiazide (HYDRODIURIL) 12.5 MG tablet Take 1 tablet (12.5 mg total) by mouth daily. (Patient not taking: Reported on 08/14/2019) 90 tablet 1  . iron polysaccharides (NIFEREX) 150 MG capsule Take 150 mg by mouth daily.    . iron polysaccharides (NU-IRON) 150 MG capsule Take 1 capsule (150 mg total) by mouth daily. (Patient not taking: Reported on 08/14/2019) 30 capsule 5  .  losartan (COZAAR) 25 MG tablet Take 1 tablet (25 mg total) by mouth daily. (Patient not taking: Reported on 08/14/2019) 90 tablet 0  . nitrofurantoin, macrocrystal-monohydrate, (MACROBID) 100 MG capsule Take 1 capsule (100 mg total) by mouth 2 (two) times daily. (Patient not taking: Reported on 08/14/2019) 10 capsule 0  . pantoprazole (PROTONIX) 40 MG tablet Take 1 tablet (40 mg total) by mouth daily. 30 tablet 3  . phenazopyridine (PYRIDIUM) 200 MG tablet Take 1 tablet (200 mg total) by mouth 3 (three) times daily. (Patient not taking: Reported on 08/14/2019) 9 tablet 0  . pravastatin (PRAVACHOL) 40 MG tablet TAKE 1 TABLET BY MOUTH ONCE DAILY IN THE EVENING 90 tablet 0  . cetirizine (ZYRTEC) 10 MG tablet Take 1 tablet (10 mg total) by mouth daily. 30 tablet 3   No facility-administered medications prior to visit.    Allergies  Allergen Reactions  . Banana     Lip swelling  . Grape (Artificial) Flavor Swelling    Allergic to grapes  . Ibuprofen Rash  . Penicillins Rash    Did it involve swelling of the face/tongue/throat, SOB, or low BP? No Did it involve sudden or severe rash/hives, skin peeling, or any reaction on the inside of your mouth or nose? No Did you need to seek medical attention at a hospital or doctor's office? No When did it last happen?more than 10 years If all above answers are "NO", may proceed with  cephalosporin use.     Review of Systems  Constitutional: Negative for chills and fever.  HENT: Positive for congestion, ear pain and sore throat. Negative for hearing loss, sinus pain and trouble swallowing.   Eyes: Negative for discharge.  Respiratory: Negative for shortness of breath.   Cardiovascular: Negative for chest pain.       Objective:    Physical Exam Constitutional:      General: She is not in acute distress.    Appearance: Normal appearance.  HENT:     Right Ear: Tympanic membrane, ear canal and external ear normal.     Left Ear: Tympanic membrane, ear canal and external ear normal.     Nose:     Right Turbinates: Swollen.     Left Turbinates: Swollen.     Mouth/Throat:     Pharynx: No oropharyngeal exudate or posterior oropharyngeal erythema.  Eyes:     Conjunctiva/sclera: Conjunctivae normal.  Cardiovascular:     Rate and Rhythm: Normal rate and regular rhythm.  Pulmonary:     Effort: Pulmonary effort is normal.     Breath sounds: Normal breath sounds.  Musculoskeletal:     Cervical back: Neck supple.  Lymphadenopathy:     Cervical: No cervical adenopathy.  Neurological:     Mental Status: She is alert.     BP 105/68 (BP Location: Left Arm, Patient Position: Sitting, Cuff Size: Small)   Pulse 98   Temp 98 F (36.7 C) (Oral)   Ht 5\' 4"  (1.626 m)   Wt 100 lb (45.4 kg)   LMP 08/21/1982   SpO2 100%   BMI 17.16 kg/m  Wt Readings from Last 3 Encounters:  09/08/19 100 lb (45.4 kg)  09/04/19 102 lb (46.3 kg)  08/29/19 102 lb (46.3 kg)    Health Maintenance Due  Topic Date Due  . LIPID PANEL  01/16/2019  . DEXA SCAN  Never done  . PNA vac Low Risk Adult (1 of 2 - PCV13) Never done  . TETANUS/TDAP  07/14/2019  There are no preventive care reminders to display for this patient.   Lab Results  Component Value Date   TSH 1.643 05/08/2012   Lab Results  Component Value Date   WBC 5.2 09/04/2019   HGB 12.8 09/04/2019   HCT 40.7  09/04/2019   MCV 96.4 09/04/2019   PLT 321 09/04/2019   Lab Results  Component Value Date   NA 140 09/04/2019   K 3.6 09/04/2019   CO2 29 09/04/2019   GLUCOSE 103 (H) 09/04/2019   BUN 12 09/04/2019   CREATININE 0.89 09/04/2019   BILITOT 0.7 08/14/2019   ALKPHOS 73 08/14/2019   AST 21 08/14/2019   ALT 14 08/14/2019   PROT 6.6 08/14/2019   ALBUMIN 3.8 08/14/2019   CALCIUM 9.4 09/04/2019   ANIONGAP 8 09/04/2019   GFR 82.40 01/29/2019   Lab Results  Component Value Date   CHOL 244 (H) 01/15/2018   Lab Results  Component Value Date   HDL 50 01/15/2018   Lab Results  Component Value Date   LDLCALC 172 (H) 01/15/2018   Lab Results  Component Value Date   TRIG 112 01/15/2018   Lab Results  Component Value Date   CHOLHDL 4.9 (H) 01/15/2018   No results found for: HGBA1C     Assessment & Plan:   Problem List Items Addressed This Visit    None       Meds ordered this encounter  Medications  . levocetirizine (XYZAL) 5 MG tablet    Sig: Take 1 tablet (5 mg total) by mouth every evening.    Dispense:  30 tablet    Refill:  2     Tawsha Terrero D Maisen Klingler, DO

## 2019-09-08 NOTE — Assessment & Plan Note (Signed)
Patient presents with continued concerns. Somewhat difficult to obtain a history from, but endorses generalized malaise and congestion. She has been using the Flonase and neti pot, but the zyrtec upsets her stomach. Physical exam and recent lab work are reassuring.   Will try Xyzal as an alternative to Zyrtec.  Encouraged her to continue flonase and neti pot daily, as well as the importance of hydration. She could also try zinc and Vitamin C supplementation as requested.

## 2019-09-08 NOTE — Patient Instructions (Signed)
Ms. Nease, It was nice seeing you. Sorry you are not feeling well. Your symptoms are likely due to allergies or a viral syndrome. I would advise that you continue using the flonase and netty pot every day. Since zyrtec hurt your stomach we can try Xyzal.   Be sure to drink plenty of fluids. To boost your immune system, you can also take Vitamin C and Zinc supplements.   Hope you feel better soon!

## 2019-09-09 ENCOUNTER — Other Ambulatory Visit: Payer: Self-pay

## 2019-09-09 ENCOUNTER — Encounter: Payer: Medicaid Other | Admitting: Internal Medicine

## 2019-09-09 ENCOUNTER — Encounter: Payer: Self-pay | Admitting: Internal Medicine

## 2019-09-09 ENCOUNTER — Telehealth: Payer: Self-pay

## 2019-09-09 ENCOUNTER — Ambulatory Visit (INDEPENDENT_AMBULATORY_CARE_PROVIDER_SITE_OTHER): Payer: Medicare Other | Admitting: Internal Medicine

## 2019-09-09 DIAGNOSIS — M545 Low back pain, unspecified: Secondary | ICD-10-CM

## 2019-09-09 DIAGNOSIS — Z87891 Personal history of nicotine dependence: Secondary | ICD-10-CM

## 2019-09-09 NOTE — Telephone Encounter (Signed)
Returned pt's call - stated she lifted something which she shouldn't had done. Now c/o pain from shoulders to mid back. Asked if she had tried cold/warm compresses - stated no. Stated she will take Tylenol and get a back brace. Asked if she would like to schedule telehealth appt to see if there's anything else to be done. She's agreeable - appt scheduled for today @ 1415 PM.

## 2019-09-09 NOTE — Assessment & Plan Note (Signed)
Spoke to patient during telehealth encounter. Patient states that she was lifting some boxes earlier today when she felt, "myself pull a muscle." Patient denies fecal incontinence, sudden bilateral lower extremity weakness or parasthesias. She plans on using a heating pad, tylenol, and back brace.   Assessment:  Patient with acute episode of back pain after lifting a heavy object. Denies symptoms of chord compression. Discouraged use of back brace. Patient voices understanding, and will not buy a back brace.   Plan:  - Tylenol PRN - Heating pad

## 2019-09-09 NOTE — Telephone Encounter (Signed)
Requesting to speak with Dr. Koleen Distance, please call pt back.

## 2019-09-09 NOTE — Progress Notes (Signed)
  [facility] Internal Medicine Residency Telephone Encounter Continuity Care Appointment  HPI:   This telephone encounter was created for [name] on 09/09/2019 for the following purpose/cc Back Pain.   Past Medical History:  Past Medical History:  Diagnosis Date  . Alcohol abuse, in remission    since around 2005  . Alcoholic hepatitis    fatty liver on imaging- Treated  . Allergic rhinitis   . Anemia    EGD with duodenal AVM, normal colonoscopy 04/2012  . Asthma   . Callus of foot 2009  . Chest pain, musculoskeletal 2011  . Cholelithiasis    [place] 09/2008: Cholelithiasis is present, s/p cholecystectomy  . Depression   . Excessive cerumen in ear canal    since before 2000  . GERD (gastroesophageal reflux disease)   . History of blood transfusion   . Hyperlipidemia   . Hypertension   . Menopausal syndrome    Treated with estradiol in the past - discontinued 04/2012  . MENOPAUSAL SYNDROME 09/24/2006   2008 Note : The pt is adament about having this medication.  She fully understands the increased risk of heart disease and cancer that is associated with HRT and is willing to accept this risk in order to prevent the debilitating hot flashes she has off this medication.  Will continue to encourage her to try to wean herself off of this medication at our next visit.  2009 note indicated that she tr  . Otitis externa, acute Feb 2012   bilateral, treated with azithromycin after failure of neomycin drops  . Otitis media, acute Feb 2012  . PONV (postoperative nausea and vomiting)       ROS:   ROS   Assessment / Plan / Recommendations:   Please see A&P under problem oriented charting for assessment of the patient's acute and chronic medical conditions.   As always, pt is advised that if symptoms worsen or new symptoms arise, they should go to an urgent care facility or to to ER for further evaluation.   Consent and Medical Decision Making:   Patient discussed with Dr.  Angelia Mould  This is a telephone encounter between Kimberly [name] and Kimberly [name] on 09/09/2019 for Back Pain. The visit was conducted with the patient located at home and Kimberly [name] at [facility]. The patient's identity was confirmed using their DOB and current address. The patient has consented to being evaluated through a telephone encounter and understands the associated risks (an examination cannot be done and the patient may need to come in for an appointment) / benefits (allows the patient to remain at home, decreasing exposure to coronavirus). I personally spent 10 minutes on medical discussion.

## 2019-09-14 NOTE — Progress Notes (Addendum)
Internal Medicine Clinic Attending  Case discussed with Dr. Bloomfield at the time of the visit.  We reviewed the resident's history and exam and pertinent patient test results.  I agree with the assessment, diagnosis, and plan of care documented in the resident's note.  

## 2019-09-16 ENCOUNTER — Encounter: Payer: Medicaid Other | Admitting: Internal Medicine

## 2019-09-16 NOTE — Progress Notes (Signed)
Internal Medicine Clinic Attending  Case discussed with Dr. Winters at the time of the visit.  We reviewed the resident's history and exam and pertinent patient test results.  I agree with the assessment, diagnosis, and plan of care documented in the resident's note.  

## 2019-09-17 ENCOUNTER — Telehealth: Payer: Self-pay

## 2019-09-17 NOTE — Telephone Encounter (Signed)
Requesting to speak with a nurse about bladder infection. Please call back.

## 2019-09-17 NOTE — Telephone Encounter (Signed)
rtc to pt, she states from experience that she has "a bladder infection". She denies fever. States burning voiding multiple times, feeling urgency. She has no way of coming in today, clinic is full 4/23, can she just come in and leave a sample in lab, void here and not see MD?

## 2019-09-18 ENCOUNTER — Other Ambulatory Visit: Payer: Self-pay | Admitting: Internal Medicine

## 2019-09-18 DIAGNOSIS — R300 Dysuria: Secondary | ICD-10-CM

## 2019-09-18 MED ORDER — SULFAMETHOXAZOLE-TRIMETHOPRIM 400-80 MG PO TABS: 2.0000 | ORAL_TABLET | Freq: Two times a day (BID) | ORAL | 0 refills | Status: AC

## 2019-09-18 NOTE — Telephone Encounter (Signed)
I spoke with her. Since she is having symptoms of dysuria and frequency for the last five days I am going to go ahead and send antibiotics. She is going to call the clinic Monday if she continues to have symptoms that have no improved.

## 2019-09-18 NOTE — Progress Notes (Signed)
[facility] [address]

## 2019-09-23 ENCOUNTER — Other Ambulatory Visit: Payer: Self-pay

## 2019-09-23 ENCOUNTER — Ambulatory Visit: Payer: Medicare Other | Admitting: Internal Medicine

## 2019-09-23 ENCOUNTER — Telehealth: Payer: Self-pay

## 2019-09-23 DIAGNOSIS — Z87898 Personal history of other specified conditions: Secondary | ICD-10-CM

## 2019-09-23 NOTE — Progress Notes (Signed)
   [place] Internal Medicine Residency Telephone Encounter  Reason for call:   This telephone encounter was created for [name] on 09/23/2019 for the following purpose/cc hx of dysuria.   Pertinent Data:  ROS: Pulmonary: pt denies increased work of breathing, shortness of breath,  Cardiac: pt denies palpitations, chest pain,   Abdominal: pt denies abdominal pain, nausea, vomiting, or diarrhea   Assessment / Plan / Recommendations:   History of dysuria: on and off for the last 3 or 4 months.  Subjective fever (she has no thermometer) she describes hot flashes like symptoms, lack of energy, difficulty sleeping, dysuria, said the antibiotics called in late last week didn't help her.  She reports she felt like one of her last courses of antibiotics helped.  Poor appetite, weight loss.  She has frequent and variable complaints, seeks healthcare at least a few times per month for a myriad of conditions.  I question if there is some underlying psychogenic component to her symptoms.  She has had many urine studies over the last year none of which have been consistent with UTI and has received many courses of antibiotics.    She needs to be examined in person, we also need lab/urine studies, concern for hyperthyroidism, malignancy, urologic condition.  She would likely benefit from regular follow up as well  She will try to schedule appointment for Friday vs early next week   As always, pt is advised that if symptoms worsen or new symptoms arise, they should go to an urgent care facility or to to ER for further evaluation.   Consent and Medical Decision Making:   Patient discussed with Dr. Dareen Piano  This is a telephone encounter between Kimberly [name] and Vickki Muff on 09/23/2019 for history of dysuria. The visit was conducted with the patient located at home and Vickki Muff at [facility]. The patient's identity was confirmed using their DOB and current address. The patient has  consented to being evaluated through a telephone encounter and understands the associated risks (an examination cannot be done and the patient may need to come in for an appointment) / benefits (allows the patient to remain at home, decreasing exposure to coronavirus). I personally spent 14 minutes on medical discussion.

## 2019-09-23 NOTE — Telephone Encounter (Signed)
Requesting to speak with a nurse about hormone, please call pt back.

## 2019-09-23 NOTE — Telephone Encounter (Signed)
rtc to pt, she states shes going thru"the change" hotflashes, not sleeping, has appt 5/12 w/ pcp will wait til then, informed if she wants to come sooner, call back she agreed

## 2019-09-24 ENCOUNTER — Other Ambulatory Visit: Payer: Self-pay

## 2019-09-24 ENCOUNTER — Encounter: Payer: Self-pay | Admitting: Internal Medicine

## 2019-09-24 ENCOUNTER — Telehealth: Payer: Self-pay

## 2019-09-24 ENCOUNTER — Observation Stay (HOSPITAL_COMMUNITY): Payer: Medicare Other

## 2019-09-24 ENCOUNTER — Encounter (HOSPITAL_COMMUNITY): Payer: Self-pay | Admitting: *Deleted

## 2019-09-24 ENCOUNTER — Observation Stay (HOSPITAL_COMMUNITY)
Admission: EM | Admit: 2019-09-24 | Discharge: 2019-09-25 | Disposition: A | Discharge status: Home / Self Care | Payer: Medicare Other | Attending: Internal Medicine | Admitting: Internal Medicine

## 2019-09-24 DIAGNOSIS — Z66 Do not resuscitate: Secondary | ICD-10-CM | POA: Insufficient documentation

## 2019-09-24 DIAGNOSIS — Z87891 Personal history of nicotine dependence: Secondary | ICD-10-CM | POA: Diagnosis not present

## 2019-09-24 DIAGNOSIS — I10 Essential (primary) hypertension: Secondary | ICD-10-CM | POA: Diagnosis not present

## 2019-09-24 DIAGNOSIS — R630 Anorexia: Secondary | ICD-10-CM | POA: Diagnosis not present

## 2019-09-24 DIAGNOSIS — K760 Fatty (change of) liver, not elsewhere classified: Secondary | ICD-10-CM | POA: Diagnosis not present

## 2019-09-24 DIAGNOSIS — K219 Gastro-esophageal reflux disease without esophagitis: Secondary | ICD-10-CM | POA: Insufficient documentation

## 2019-09-24 DIAGNOSIS — Z681 Body mass index (BMI) 19 or less, adult: Secondary | ICD-10-CM | POA: Diagnosis not present

## 2019-09-24 DIAGNOSIS — Z7951 Long term (current) use of inhaled steroids: Secondary | ICD-10-CM | POA: Diagnosis not present

## 2019-09-24 DIAGNOSIS — N179 Acute kidney failure, unspecified: Secondary | ICD-10-CM | POA: Diagnosis not present

## 2019-09-24 DIAGNOSIS — J439 Emphysema, unspecified: Secondary | ICD-10-CM | POA: Insufficient documentation

## 2019-09-24 DIAGNOSIS — Z886 Allergy status to analgesic agent status: Secondary | ICD-10-CM | POA: Diagnosis not present

## 2019-09-24 DIAGNOSIS — R918 Other nonspecific abnormal finding of lung field: Secondary | ICD-10-CM | POA: Insufficient documentation

## 2019-09-24 DIAGNOSIS — U071 COVID-19: Secondary | ICD-10-CM | POA: Diagnosis present

## 2019-09-24 DIAGNOSIS — Z9049 Acquired absence of other specified parts of digestive tract: Secondary | ICD-10-CM | POA: Insufficient documentation

## 2019-09-24 DIAGNOSIS — I700 Atherosclerosis of aorta: Secondary | ICD-10-CM | POA: Insufficient documentation

## 2019-09-24 DIAGNOSIS — Z8249 Family history of ischemic heart disease and other diseases of the circulatory system: Secondary | ICD-10-CM | POA: Diagnosis not present

## 2019-09-24 DIAGNOSIS — E860 Dehydration: Secondary | ICD-10-CM

## 2019-09-24 DIAGNOSIS — Z79899 Other long term (current) drug therapy: Secondary | ICD-10-CM | POA: Diagnosis not present

## 2019-09-24 DIAGNOSIS — Z87898 Personal history of other specified conditions: Secondary | ICD-10-CM | POA: Insufficient documentation

## 2019-09-24 DIAGNOSIS — E46 Unspecified protein-calorie malnutrition: Secondary | ICD-10-CM | POA: Diagnosis not present

## 2019-09-24 DIAGNOSIS — E785 Hyperlipidemia, unspecified: Secondary | ICD-10-CM | POA: Diagnosis not present

## 2019-09-24 DIAGNOSIS — E876 Hypokalemia: Secondary | ICD-10-CM | POA: Diagnosis not present

## 2019-09-24 DIAGNOSIS — Z90710 Acquired absence of both cervix and uterus: Secondary | ICD-10-CM | POA: Diagnosis not present

## 2019-09-24 DIAGNOSIS — F329 Major depressive disorder, single episode, unspecified: Secondary | ICD-10-CM | POA: Insufficient documentation

## 2019-09-24 DIAGNOSIS — Z880 Allergy status to penicillin: Secondary | ICD-10-CM | POA: Insufficient documentation

## 2019-09-24 LAB — CBC WITH DIFFERENTIAL/PLATELET
Abs Immature Granulocytes: 0.04 10*3/uL (ref 0.00–0.07)
Basophils Absolute: 0 10*3/uL (ref 0.0–0.1)
Basophils Relative: 0 %
Eosinophils Absolute: 0 10*3/uL (ref 0.0–0.5)
Eosinophils Relative: 0 %
HCT: 42.7 % (ref 36.0–46.0)
Hemoglobin: 13.4 g/dL (ref 12.0–15.0)
Immature Granulocytes: 1 %
Lymphocytes Relative: 16 %
Lymphs Abs: 1 10*3/uL (ref 0.7–4.0)
MCH: 30.1 pg (ref 26.0–34.0)
MCHC: 31.4 g/dL (ref 30.0–36.0)
MCV: 96 fL (ref 80.0–100.0)
Monocytes Absolute: 0.7 10*3/uL (ref 0.1–1.0)
Monocytes Relative: 12 %
Neutro Abs: 4.2 10*3/uL (ref 1.7–7.7)
Neutrophils Relative %: 71 %
Platelets: 250 10*3/uL (ref 150–400)
RBC: 4.45 MIL/uL (ref 3.87–5.11)
RDW: 12.4 % (ref 11.5–15.5)
WBC: 6 10*3/uL (ref 4.0–10.5)
nRBC: 0 % (ref 0.0–0.2)

## 2019-09-24 LAB — POCT I-STAT EG7
Acid-base deficit: 1 mmol/L (ref 0.0–2.0)
Bicarbonate: 25.6 mmol/L (ref 20.0–28.0)
Calcium, Ion: 1.06 mmol/L — ABNORMAL LOW (ref 1.15–1.40)
HCT: 41 % (ref 36.0–46.0)
Hemoglobin: 13.9 g/dL (ref 12.0–15.0)
O2 Saturation: 94 %
Potassium: 3.1 mmol/L — ABNORMAL LOW (ref 3.5–5.1)
Sodium: 140 mmol/L (ref 135–145)
TCO2: 27 mmol/L (ref 22–32)
pCO2, Ven: 48.1 mmHg (ref 44.0–60.0)
pH, Ven: 7.334 (ref 7.250–7.430)
pO2, Ven: 76 mmHg — ABNORMAL HIGH (ref 32.0–45.0)

## 2019-09-24 LAB — COMPREHENSIVE METABOLIC PANEL
ALT: 24 U/L (ref 0–44)
AST: 32 U/L (ref 15–41)
Albumin: 3.3 g/dL — ABNORMAL LOW (ref 3.5–5.0)
Alkaline Phosphatase: 66 U/L (ref 38–126)
Anion gap: 14 (ref 5–15)
BUN: 24 mg/dL — ABNORMAL HIGH (ref 8–23)
CO2: 23 mmol/L (ref 22–32)
Calcium: 8.3 mg/dL — ABNORMAL LOW (ref 8.9–10.3)
Chloride: 104 mmol/L (ref 98–111)
Creatinine, Ser: 2.65 mg/dL — ABNORMAL HIGH (ref 0.44–1.00)
GFR calc Af Amer: 21 mL/min — ABNORMAL LOW (ref >60)
GFR calc non Af Amer: 18 mL/min — ABNORMAL LOW (ref >60)
Glucose, Bld: 128 mg/dL — ABNORMAL HIGH (ref 70–99)
Potassium: 3.4 mmol/L — ABNORMAL LOW (ref 3.5–5.1)
Sodium: 141 mmol/L (ref 135–145)
Total Bilirubin: 0.7 mg/dL (ref 0.3–1.2)
Total Protein: 6.1 g/dL — ABNORMAL LOW (ref 6.5–8.1)

## 2019-09-24 LAB — LIPASE, BLOOD: Lipase: 22 U/L (ref 11–51)

## 2019-09-24 LAB — HIV ANTIBODY (ROUTINE TESTING W REFLEX): HIV Screen 4th Generation wRfx: NONREACTIVE

## 2019-09-24 LAB — RESPIRATORY PANEL BY RT PCR (FLU A&B, COVID)
Influenza A by PCR: NEGATIVE
Influenza B by PCR: NEGATIVE
SARS Coronavirus 2 by RT PCR: POSITIVE — AB

## 2019-09-24 LAB — I-STAT CREATININE, ED: Creatinine, Ser: 2.5 mg/dL — ABNORMAL HIGH (ref 0.44–1.00)

## 2019-09-24 LAB — LACTIC ACID, PLASMA
Lactic Acid, Venous: 2.4 mmol/L (ref 0.5–1.9)
Lactic Acid, Venous: 2.3 mmol/L (ref 0.5–1.9)

## 2019-09-24 LAB — TSH: TSH: 2.09 u[IU]/mL (ref 0.350–4.500)

## 2019-09-24 LAB — T4, FREE: Free T4: 1.11 ng/dL (ref 0.61–1.12)

## 2019-09-24 LAB — CREATININE, URINE, RANDOM: Creatinine, Urine: 406.54 mg/dL

## 2019-09-24 LAB — MAGNESIUM: Magnesium: 2.4 mg/dL (ref 1.7–2.4)

## 2019-09-24 LAB — TROPONIN I (HIGH SENSITIVITY): Troponin I (High Sensitivity): 8 ng/L (ref <18)

## 2019-09-24 LAB — SODIUM, URINE, RANDOM: Sodium, Ur: 46 mmol/L

## 2019-09-24 MED ORDER — PRAVASTATIN SODIUM 40 MG PO TABS
40.0000 mg | ORAL_TABLET | Freq: Every evening | ORAL | Status: DC
  Administered 2019-09-24: 40 mg via ORAL
  Filled 2019-09-24: qty 1

## 2019-09-24 MED ORDER — LACTATED RINGERS IV BOLUS
1000.0000 mL | Freq: Once | INTRAVENOUS | Status: AC
  Administered 2019-09-24: 21:00:00 1000 mL via INTRAVENOUS

## 2019-09-24 MED ORDER — SODIUM CHLORIDE 0.9 % IV SOLN
200.0000 mg | Freq: Once | INTRAVENOUS | Status: AC
  Administered 2019-09-24: 200 mg via INTRAVENOUS
  Filled 2019-09-24: qty 40

## 2019-09-24 MED ORDER — ACETAMINOPHEN 650 MG RE SUPP: 650.0000 mg | Freq: Four times a day (QID) | RECTAL | Status: DC | PRN

## 2019-09-24 MED ORDER — LACTATED RINGERS IV SOLN: INTRAVENOUS | Status: DC

## 2019-09-24 MED ORDER — DIPHENHYDRAMINE HCL 50 MG/ML IJ SOLN
12.5000 mg | Freq: Once | INTRAMUSCULAR | Status: AC
  Administered 2019-09-24: 11:00:00 12.5 mg via INTRAVENOUS
  Filled 2019-09-24: qty 1

## 2019-09-24 MED ORDER — PROCHLORPERAZINE EDISYLATE 10 MG/2ML IJ SOLN
5.0000 mg | Freq: Once | INTRAMUSCULAR | Status: AC
  Administered 2019-09-24: 11:00:00 5 mg via INTRAVENOUS
  Filled 2019-09-24: qty 2

## 2019-09-24 MED ORDER — PROMETHAZINE HCL 25 MG PO TABS: 12.5000 mg | ORAL_TABLET | Freq: Four times a day (QID) | ORAL | Status: DC | PRN

## 2019-09-24 MED ORDER — HEPARIN SODIUM (PORCINE) 5000 UNIT/ML IJ SOLN
5000.0000 [IU] | Freq: Three times a day (TID) | INTRAMUSCULAR | Status: DC
  Administered 2019-09-24 – 2019-09-25 (×2): 5000 [IU] via SUBCUTANEOUS
  Filled 2019-09-24 (×2): qty 1

## 2019-09-24 MED ORDER — LACTATED RINGERS IV BOLUS
1000.0000 mL | Freq: Once | INTRAVENOUS | Status: AC
  Administered 2019-09-24: 11:00:00 1000 mL via INTRAVENOUS

## 2019-09-24 MED ORDER — SENNOSIDES-DOCUSATE SODIUM 8.6-50 MG PO TABS: 1.0000 | ORAL_TABLET | Freq: Every evening | ORAL | Status: DC | PRN

## 2019-09-24 MED ORDER — ACETAMINOPHEN 325 MG PO TABS
650.0000 mg | ORAL_TABLET | Freq: Four times a day (QID) | ORAL | Status: DC | PRN
  Administered 2019-09-25: 650 mg via ORAL
  Filled 2019-09-24: qty 2

## 2019-09-24 MED ORDER — SODIUM CHLORIDE 0.9 % IV SOLN
100.0000 mg | Freq: Every day | INTRAVENOUS | Status: DC
  Administered 2019-09-25: 09:00:00 100 mg via INTRAVENOUS
  Filled 2019-09-24: qty 20

## 2019-09-24 NOTE — ED Triage Notes (Signed)
Patient presents to ed via [facility] states she has been weak and not feeling well for 2 weeks.  States she just doesn't have any energy c/o diarrhea x 1 week. States she doesn't have an appetite however she is able to drink fluids.

## 2019-09-24 NOTE — ED Notes (Signed)
Assumed care on patient , respirations unlabored , IV site intact , denies pain , waiting for in-patient bed assignment .

## 2019-09-24 NOTE — Progress Notes (Signed)
Internal Medicine Clinic Attending  Case discussed with Dr. Winters at the time of the visit.  We reviewed the resident's history and exam and pertinent patient test results.  I agree with the assessment, diagnosis, and plan of care documented in the resident's note.  Katiana Ruland, M.D., Ph.D.  

## 2019-09-24 NOTE — Telephone Encounter (Signed)
F/U - pt is currently in the ER per Epic.

## 2019-09-24 NOTE — ED Provider Notes (Signed)
[facility] Provider Note   CSN: [id number] Arrival date & time: 09/24/19  1001     History Chief Complaint  Patient presents with  . Weakness    Kimberly [name] is a 64 y.o. female.  64 yo F with a chief complaints of fatigue.  Is been going on for at least a month it looks like looking back at the notes.  Patient states that she has been getting progressively worse.  Said this morning she tried to get up and felt like she was going to pass out.  She was able to clean to the walls and did not injure herself when she collapsed.  Her sister ended up calling 911 and brought her to the hospital.  She has been nauseated, not really eating and drinking.  She is complaining of a left-sided headache.  She denies head injury denies loss consciousness denies neck pain.  Denies fevers but has been feeling subjectively hot and cold.  Denies vomiting or diarrhea.  Denies abdominal discomfort.  Denies chest pain.  She has had some shortness of breath.  She also has been having some burning when she pees.  The history is provided by the patient.  Weakness Severity:  Moderate Onset quality:  Gradual Duration:  4 weeks Timing:  Intermittent Progression:  Waxing and waning Chronicity:  Recurrent Relieved by:  Nothing Worsened by:  Nothing Ineffective treatments:  None tried Associated symptoms: dysuria, fever (subjective) and nausea   Associated symptoms: no abdominal pain, no arthralgias, no chest pain, no diarrhea, no dizziness, no headaches, no myalgias, no shortness of breath, no urgency and no vomiting        Past Medical History:  Diagnosis Date  . Alcohol abuse, in remission    since around 2005  . Alcoholic hepatitis    fatty liver on imaging- Treated  . Allergic rhinitis   . Anemia    EGD with duodenal AVM, normal colonoscopy 04/2012  . Asthma   . Callus of foot 2009  . Chest pain, musculoskeletal 2011  . Cholelithiasis    [place] 09/2008:  Cholelithiasis is present, s/p cholecystectomy  . Depression   . Excessive cerumen in ear canal    since before 2000  . GERD (gastroesophageal reflux disease)   . History of blood transfusion   . Hyperlipidemia   . Hypertension   . Menopausal syndrome    Treated with estradiol in the past - discontinued 04/2012  . MENOPAUSAL SYNDROME 09/24/2006   2008 Note : The pt is adament about having this medication.  She fully understands the increased risk of heart disease and cancer that is associated with HRT and is willing to accept this risk in order to prevent the debilitating hot flashes she has off this medication.  Will continue to encourage her to try to wean herself off of this medication at our next visit.  2009 note indicated that she tr  . Otitis externa, acute Feb 2012   bilateral, treated with azithromycin after failure of neomycin drops  . Otitis media, acute Feb 2012  . PONV (postoperative nausea and vomiting)     Patient Active Problem List   Diagnosis Date Noted  . History of dysuria 09/24/2019  . Myalgia 09/07/2019  . Rhinosinusitis 08/27/2019  . Acute right-sided low back pain without sciatica 08/04/2019  . Dysuria 05/25/2019  . Tachycardia 05/25/2019  . Hematuria 05/25/2019  . Ear pain, bilateral 04/27/2019  . AVM (arteriovenous malformation) of small bowel, acquired  01/29/2019  . Vaginal discharge 01/18/2019  . Abdominal gas pain 12/16/2018  . Microcytic anemia 11/28/2018  . Pain in right thigh 01/15/2018  . Health care maintenance 03/25/2013  . Depression 12/03/2012  . Iron deficiency anemia 05/08/2012  . HLD (hyperlipidemia) 03/25/2008  . Constipation 11/04/2007  . Essential hypertension 09/24/2006  . Allergies 09/24/2006  . Asthma 09/24/2006  . GERD 09/24/2006    Past Surgical History:  Procedure Laterality Date  . ABDOMINAL HYSTERECTOMY  1980s  . BIOPSY  03/18/2019   Procedure: BIOPSY;  Surgeon: Irving Copas., MD;  Location: [facility];   Service: Gastroenterology;;  . Lorin Mercy  01/06/09  . COLONOSCOPY  05/09/2012   Procedure: COLONOSCOPY;  Surgeon: Inda Castle, MD;  Location: [facility];  Service: Endoscopy;  Laterality: N/A;  . COLONOSCOPY WITH PROPOFOL N/A 03/18/2019   Procedure: COLONOSCOPY WITH PROPOFOL;  Surgeon: Rush Landmark Telford Nab., MD;  Location: [facility];  Service: Gastroenterology;  Laterality: N/A;  . ENTEROSCOPY N/A 03/18/2019   Procedure: ENTEROSCOPY;  Surgeon: Rush Landmark Telford Nab., MD;  Location: [facility];  Service: Gastroenterology;  Laterality: N/A;  . ESOPHAGOGASTRODUODENOSCOPY  05/09/2012   Procedure: ESOPHAGOGASTRODUODENOSCOPY (EGD);  Surgeon: Inda Castle, MD;  Location: [facility];  Service: Endoscopy;  Laterality: N/A;  . HEMOSTASIS CLIP PLACEMENT  03/18/2019   Procedure: HEMOSTASIS CLIP PLACEMENT;  Surgeon: Irving Copas., MD;  Location: [facility];  Service: Gastroenterology;;  . HOT HEMOSTASIS N/A 03/18/2019   Procedure: HOT HEMOSTASIS (ARGON PLASMA COAGULATION/BICAP);  Surgeon: Irving Copas., MD;  Location: [facility];  Service: Gastroenterology;  Laterality: N/A;  . POLYPECTOMY  03/18/2019   Procedure: POLYPECTOMY;  Surgeon: Rush Landmark Telford Nab., MD;  Location: [place];  Service: Gastroenterology;;  . [place] INJECTION  03/18/2019   Procedure: SUBMUCOSAL TATTOO INJECTION;  Surgeon: Irving Copas., MD;  Location: [place] [facility];  Service: Gastroenterology;;     OB History   No obstetric history on file.     Family History  Problem Relation Age of Onset  . Hypertension Mother   . Colon cancer Neg Hx   . Esophageal cancer Neg Hx   . Inflammatory bowel disease Neg Hx   . Liver disease Neg Hx   . Pancreatic cancer Neg Hx   . Rectal cancer Neg Hx   . Stomach cancer Neg Hx     Social History   Tobacco Use  . Smoking status: Former Smoker    Types: Cigarettes    Quit date: 08/21/1995    Years since quitting:  24.1  . Smokeless tobacco: Never Used  Substance Use Topics  . Alcohol use: No    Comment: in remission since 2007  . Drug use: Yes    Types: Marijuana    Home Medications Prior to Admission medications   Medication Sig Start Date End Date Taking? Authorizing Provider  amitriptyline (ELAVIL) 25 MG tablet TAKE 1 TABLET BY MOUTH AT BEDTIME Patient taking differently: Take 25 mg by mouth at bedtime.  06/24/19  Yes Seawell, Jaimie A, DO  FLOVENT HFA 110 MCG/ACT inhaler Inhale 2 puffs by mouth twice daily 07/01/19  Yes Bartholomew Crews, MD  fluticasone [facility]) 50 MCG/ACT nasal spray Place 2 sprays into both nostrils daily. 08/27/19  Yes Ladona Horns, MD  hydrochlorothiazide (HYDRODIURIL) 12.5 MG tablet Take 1 tablet (12.5 mg total) by mouth daily. 08/07/19  Yes Kathi Ludwig, MD  iron polysaccharides (NIFEREX) 150 MG capsule Take 150 mg by mouth daily.   Yes [provider]  pantoprazole (PROTONIX)  40 MG tablet Take 1 tablet (40 mg total) by mouth daily. 08/24/19  Yes Mansouraty, Telford Nab., MD  pravastatin (PRAVACHOL) 40 MG tablet TAKE 1 TABLET BY MOUTH ONCE DAILY IN THE EVENING 08/18/19  Yes Seawell, Jaimie A, DO  baclofen (LIORESAL) 10 MG tablet Take 1 tablet (10 mg total) by mouth 2 (two) times daily. Patient not taking: Reported on 09/24/2019 08/04/19 08/03/20  Kathi Ludwig, MD  carbamazepine (TEGRETOL) 200 MG tablet Take 0.5 tablets (100 mg total) by mouth in the morning and at bedtime. Patient not taking: Reported on 09/24/2019 08/29/19   Quintella Reichert, MD  cefdinir (OMNICEF) 300 MG capsule Take 1 capsule (300 mg total) by mouth 2 (two) times daily. For ten days. Patient not taking: Reported on 09/24/2019 08/06/19   Kathi Ludwig, MD  Ferrous Gluconate 256 (28 Fe) MG TABS Take 256 mg by mouth daily. Patient not taking: Reported on 08/14/2019 05/25/19   Kathi Ludwig, MD  iron polysaccharides (NU-IRON) 150 MG capsule Take 1 capsule (150 mg total) by mouth  daily. Patient not taking: Reported on 08/14/2019 12/15/18   Seawell, Andris Baumann A, DO  levocetirizine (XYZAL) 5 MG tablet Take 1 tablet (5 mg total) by mouth every evening. Patient not taking: Reported on 09/24/2019 09/08/19   Modena Nunnery D, DO  losartan (COZAAR) 25 MG tablet Take 1 tablet (25 mg total) by mouth daily. Patient not taking: Reported on 08/14/2019 08/07/19   Kathi Ludwig, MD  nitrofurantoin, macrocrystal-monohydrate, (MACROBID) 100 MG capsule Take 1 capsule (100 mg total) by mouth 2 (two) times daily. Patient not taking: Reported on 08/14/2019 05/22/19   Corena Herter, PA-C  phenazopyridine (PYRIDIUM) 200 MG tablet Take 1 tablet (200 mg total) by mouth 3 (three) times daily. Patient not taking: Reported on 08/14/2019 05/22/19   Corena Herter, PA-C    Allergies    Banana, Grape (artificial) flavor, Ibuprofen, and Penicillins  Review of Systems   Review of Systems  Constitutional: Positive for chills, fatigue and fever (subjective).  HENT: Negative for congestion and rhinorrhea.   Eyes: Negative for redness and visual disturbance.  Respiratory: Negative for shortness of breath and wheezing.   Cardiovascular: Negative for chest pain and palpitations.  Gastrointestinal: Positive for nausea. Negative for abdominal pain, diarrhea and vomiting.  Genitourinary: Positive for dysuria. Negative for urgency.  Musculoskeletal: Negative for arthralgias and myalgias.  Skin: Negative for pallor and wound.  Neurological: Positive for weakness. Negative for dizziness and headaches.    Physical Exam Updated Vital Signs BP 90/65   Pulse 96   Temp 98 F (36.7 C) (Oral)   Resp 19   Ht 5\' 4"  (1.626 m)   Wt 45.8 kg   LMP 08/21/1982   SpO2 93%   BMI 17.34 kg/m   Physical Exam Vitals and nursing note reviewed.  Constitutional:      General: She is not in acute distress.    Appearance: She is well-developed. She is not diaphoretic.     Comments: Cachectic, appears much  older than stated age  HENT:     Head: Normocephalic and atraumatic.     Comments: Dry lips, tacky mucous membranes Eyes:     Pupils: Pupils are equal, round, and reactive to light.  Cardiovascular:     Rate and Rhythm: Normal rate and regular rhythm.     Heart sounds: No murmur. No friction rub. No gallop.   Pulmonary:     Effort: Pulmonary effort is normal.     Breath sounds: No  wheezing or rales.  Abdominal:     General: There is no distension.     Palpations: Abdomen is soft.     Tenderness: There is abdominal tenderness.     Comments: Tenderness to the epigastrium and right upper quadrant  Musculoskeletal:        General: No tenderness.     Cervical back: Normal range of motion and neck supple.  Skin:    General: Skin is warm and dry.  Neurological:     Mental Status: She is alert and oriented to person, place, and time.     Comments: No obvious weakness found on neurologic exam though when the patient tries to do heel-to-shin with the left lower extremity she states that she is too weak to do so and immediately drops her leg back to the bed when she tries to lift it.  Subjective decrease sensation to the left side of her face.  Otherwise cranial nerves II through XII are intact.  Extraocular motion intact.  Psychiatric:        Behavior: Behavior normal.     ED Results / Procedures / Treatments   Labs (all labs ordered are listed, but only abnormal results are displayed) Labs Reviewed  RESPIRATORY PANEL BY RT PCR (FLU A&B, COVID) - Abnormal; Notable for the following components:      Result Value   SARS Coronavirus 2 by RT PCR POSITIVE (*)    All other components within normal limits  COMPREHENSIVE METABOLIC PANEL - Abnormal; Notable for the following components:   Potassium 3.4 (*)    Glucose, Bld 128 (*)    BUN 24 (*)    Creatinine, Ser 2.65 (*)    Calcium 8.3 (*)    Total Protein 6.1 (*)    Albumin 3.3 (*)    GFR calc non Af Amer 18 (*)    GFR calc Af Amer 21  (*)    All other components within normal limits  I-STAT CREATININE, ED - Abnormal; Notable for the following components:   Creatinine, Ser 2.50 (*)    All other components within normal limits  POCT I-STAT EG7 - Abnormal; Notable for the following components:   pO2, Ven 76.0 (*)    Potassium 3.1 (*)    Calcium, Ion 1.06 (*)    All other components within normal limits  CBC WITH DIFFERENTIAL/PLATELET  LIPASE, BLOOD  T4, FREE  TSH  TROPONIN I (HIGH SENSITIVITY)    EKG None  Radiology No results found.  Procedures Procedures (including critical care time)  Medications Ordered in ED Medications  lactated ringers bolus 1,000 mL (0 mLs Intravenous Stopped 09/24/19 1224)  prochlorperazine (COMPAZINE) injection 5 mg (5 mg Intravenous Given 09/24/19 1046)  diphenhydrAMINE (BENADRYL) injection 12.5 mg (12.5 mg Intravenous Given 09/24/19 1047)    ED Course  I have reviewed the triage vital signs and the nursing notes.  Pertinent labs & imaging results that were available during my care of the patient were reviewed by me and considered in my medical decision making (see chart for details).    MDM Rules/Calculators/A&P                      64 yo F with chief complaint of nausea and decreased oral intake for about a month.  She has multiple other symptoms as well.  Unfortunately is not been able to see her family doctor in the office.  Has had multiple virtual visits as well as multiple ED visits  during this time.  She is telling me that she is too weak to stand up and walk.  Will obtain a laboratory evaluation.  CT the head and abdomen and pelvis.  Clinically she appears to be very dehydrated with tacky mucous membranes.  We will give a bolus of IV fluids.  Reassess.  Patient feeling mildly better on reassessment.  Has a significant AKI Baseline creatinine about 0.8 went to 2.6.  Found to have the novel coronavirus.  Will discuss with medicine for admission.  The patients results and  plan were reviewed and discussed.   Any x-rays performed were independently reviewed by myself.   Differential diagnosis were considered with the presenting HPI.  Medications  lactated ringers bolus 1,000 mL (0 mLs Intravenous Stopped 09/24/19 1224)  prochlorperazine (COMPAZINE) injection 5 mg (5 mg Intravenous Given 09/24/19 1046)  diphenhydrAMINE (BENADRYL) injection 12.5 mg (12.5 mg Intravenous Given 09/24/19 1047)    Vitals:   09/24/19 1245 09/24/19 1300 09/24/19 1315 09/24/19 1330  BP: (!) 87/66 98/74 94/65  90/65  Pulse:  100 95 96  Resp: 18 17 20 19   Temp:      TempSrc:      SpO2:  97% 98% 93%  Weight:      Height:        Final diagnoses:  AKI (acute kidney injury) ([place])  Dehydration  COVID-19 virus infection    Admission/ observation were discussed with the admitting physician, patient and/or family and they are comfortable with the plan.   Final Clinical Impression(s) / ED Diagnoses Final diagnoses:  AKI (acute kidney injury) ([place])  Dehydration  COVID-19 virus infection    Rx / DC Orders ED Discharge Orders    None       Deno Etienne, DO 09/24/19 1347

## 2019-09-24 NOTE — ED Notes (Signed)
Daughter called and updated , Magda Paganini [phone]

## 2019-09-24 NOTE — H&P (Signed)
Date: 09/24/2019               Patient Name:  Kimberly [name] MRN: [id number]  DOB: Aug 31, 1955 Age / Sex: 64 y.o., female   PCP: [name], DO         Medical Service: Internal Medicine Teaching Service         Attending Physician: Dr. Deno Etienne, DO    First Contact: Marva Panda, MD, [place] Pager: [place] [phone])  Second Contact: Maricela Bo, MD, [place] Pager: Tracie Harrier [phone])       After Hours (After 5p/  First Contact Pager: [phone]  weekends / holidays): Second Contact Pager: [phone]   Chief Complaint: weakness   History of Present Illness: Kimberly [name] is a  64 y.o. female w/ PMH significant for hypertension, hyperlipidemia, anemia, GERD and depression presenting with decreased appetite, persistent diarrhea and decreased energy since November 2020, potentially worsening over the past month. She notes having up to 3 loose stools per day without hematochezia or melena. She also has had decreased urinary frequency and urinary hesitancy. She notes chronic headache and dizziness for this extent of time as well and notes that she has not been able to sleep well. She also endorses night sweats for this duration but is unable to quantify whether this soaks through the sheets as she has been too weak to get up. She believes this is secondary to her pollen allergy. She notes that she is only able to get out of bed to use the restroom and reheat food. She notes that she has not had a full meal over several weeks due to decreased appetite and persistent nausea. She believes she has been losing weight as her clothes have been fitting differently. She does not have any odynophagia or dysphagia. She endorses subjective fevers and chills at home. She denies any chest pain or dyspnea. She denies any dysuria, abdominal pain, vision changes or focal weakness. She denies any recent sick contacts.   ED course: Patient presented via [facility] for nausea, anorexia and weakness. Patient noted to be  dehydrated and given bolus of IV fluids. She is noted to have significant AKI (sCr 2.6 from baseline of 0.8). Also noted to be COVID positive. Patient given compazine and benadryl for nausea. She was admitted for further management.     Meds:  Current Meds  Medication Sig  . amitriptyline (ELAVIL) 25 MG tablet TAKE 1 TABLET BY MOUTH AT BEDTIME (Patient taking differently: Take 25 mg by mouth at bedtime. )  . FLOVENT HFA 110 MCG/ACT inhaler Inhale 2 puffs by mouth twice daily  . fluticasone (FLONASE) 50 MCG/ACT nasal spray Place 2 sprays into both nostrils daily.  . hydrochlorothiazide (HYDRODIURIL) 12.5 MG tablet Take 1 tablet (12.5 mg total) by mouth daily.  . iron polysaccharides (NIFEREX) 150 MG capsule Take 150 mg by mouth daily.  . pantoprazole (PROTONIX) 40 MG tablet Take 1 tablet (40 mg total) by mouth daily.  . pravastatin (PRAVACHOL) 40 MG tablet TAKE 1 TABLET BY MOUTH ONCE DAILY IN THE EVENING    Allergies: Allergies as of 09/24/2019 - Review Complete 09/24/2019  Allergen Reaction Noted  . Banana  02/08/2011  . Grape (artificial) flavor Swelling 02/08/2011  . Ibuprofen Rash   . Penicillins Rash    Past Medical History:  Diagnosis Date  . Alcohol abuse, in remission    since around 2005  . Alcoholic hepatitis    fatty liver on imaging- Treated  . Allergic rhinitis   .  Anemia    EGD with duodenal AVM, normal colonoscopy 04/2012  . Asthma   . Callus of foot 2009  . Chest pain, musculoskeletal 2011  . Cholelithiasis    [place] 09/2008: Cholelithiasis is present, s/p cholecystectomy  . Depression   . Excessive cerumen in ear canal    since before 2000  . GERD (gastroesophageal reflux disease)   . History of blood transfusion   . Hyperlipidemia   . Hypertension   . Menopausal syndrome    Treated with estradiol in the past - discontinued 04/2012  . MENOPAUSAL SYNDROME 09/24/2006   2008 Note : The pt is adament about having this medication.  She fully understands the  increased risk of heart disease and cancer that is associated with HRT and is willing to accept this risk in order to prevent the debilitating hot flashes she has off this medication.  Will continue to encourage her to try to wean herself off of this medication at our next visit.  2009 note indicated that she tr  . Otitis externa, acute Feb 2012   bilateral, treated with azithromycin after failure of neomycin drops  . Otitis media, acute Feb 2012  . PONV (postoperative nausea and vomiting)     Family History:  Family History  Problem Relation Age of Onset  . Hypertension Mother   . Colon cancer Neg Hx   . Esophageal cancer Neg Hx   . Inflammatory bowel disease Neg Hx   . Liver disease Neg Hx   . Pancreatic cancer Neg Hx   . Rectal cancer Neg Hx   . Stomach cancer Neg Hx      Social History:  Social History   Tobacco Use  . Smoking status: Former Smoker    Types: Cigarettes    Quit date: 08/21/1995    Years since quitting: 24.1  . Smokeless tobacco: Never Used  Substance Use Topics  . Alcohol use: No    Comment: in remission since 2007  . Drug use: Yes    Types: Marijuana   Patient lives by herself and is independent in her ADLs. She has a former history of tobacco use but has not had any for 25 years. She denies any alcohol use or any other illicit drug use.    Review of Systems: A complete ROS was negative except as per HPI.   Physical Exam: Blood pressure 90/65, pulse 96, temperature 98 F (36.7 C), temperature source Oral, resp. rate 19, height 5\' 4"  (1.626 m), weight 45.8 kg, last menstrual period 08/21/1982, SpO2 93 %. Physical Exam Vitals reviewed.  Constitutional:      General: She is not in acute distress.    Appearance: Normal appearance. She is ill-appearing. She is not toxic-appearing or diaphoretic.  HENT:     Head: Normocephalic and atraumatic.     Mouth/Throat:     Mouth: Mucous membranes are moist.     Pharynx: Oropharynx is clear. No oropharyngeal  exudate or posterior oropharyngeal erythema.  Eyes:     General: No scleral icterus.    Extraocular Movements: Extraocular movements intact.     Conjunctiva/sclera: Conjunctivae normal.  Cardiovascular:     Rate and Rhythm: Normal rate and regular rhythm.     Pulses: Normal pulses.     Heart sounds: Normal heart sounds. No murmur. No friction rub. No gallop.   Pulmonary:     Effort: Pulmonary effort is normal. No respiratory distress.     Breath sounds: Normal breath sounds. No stridor. No  wheezing, rhonchi or rales.  Abdominal:     General: Abdomen is flat. Bowel sounds are normal. There is no distension.     Palpations: Abdomen is soft. There is no mass.     Tenderness: There is abdominal tenderness. There is no guarding or rebound.     Comments: LLQ tenderness to palpation  Musculoskeletal:        General: No swelling, tenderness, deformity or signs of injury. Normal range of motion.     Cervical back: Normal range of motion and neck supple.  Skin:    General: Skin is warm and dry.     Capillary Refill: Capillary refill takes less than 2 seconds.     Findings: No bruising, erythema, lesion or rash.  Neurological:     General: No focal deficit present.     Mental Status: She is alert and oriented to person, place, and time.     Cranial Nerves: No cranial nerve deficit.     Sensory: No sensory deficit.    EKG: personally reviewed my interpretation is irregular rhythm, some ST segment depression in II and aVF but nonspecific; Nonspecific T wave inversions   CT HEAD WO CONTRAST:  IMPRESSION: No acute intracranial findings.  CT ABDOMEN PELVIS WO CONTRAST:  IMPRESSION: 1. Developing airspace opacities at the lung bases, favored to represent infectious or inflammatory process. 2. 1.2 x 0.9 centimeter focal ground-glass opacity in the LEFT LOWER lobe is unchanged since previous CT exam. Recommend follow-up CTin 3 months. 3. Cholecystectomy and hysterectomy. 4. Aortic  Atherosclerosis (ICD10-I70.0). 5.  Emphysema (ICD10-J43.9).  Assessment & Plan by Problem: Kimberly [name] is a 64 year old female with PMHx of hypertension, hyperlipidemia, anemia, GERD and depression presenting with persistent nausea, anorexia and lethargy that is acutely worsened over the past one month.   COVID 19 Infection: Patient with history of anorexia, lethargy, persistent GI symptoms since November. She also endorses subjective fevers/chills for this duration as well that appears to be worsening over past month. Patient denies any chest pain or dyspnea. She denies any recent exposure. Noted to be COVID positive on admission. She is saturating well on room air and lungs are clear to auscultation bilaterally.  - Airborne and contact precautions  - IV Remdesivir 200mg  x1 followed by IV Remdesivir 100mg  x4 - Monitor CRP, CBC, D-dimer, ferritin, and lactic acid  - Supplemental O2 prn  - ICS and flutter valve  AKI: Patient with decreased urine output over past several months and urinary hesitancy in setting of decreased oral intake. She was noted to have sCr of 2.65 with GFR 21 on admission (baseline 0.89 on 4/10). She received 1L normal saline with improvement of sCr to 2.50. Suspect this is pre-renal in setting of decreased PO intake and patient will likely need fluid resuscitation.  - Urine Na and Cr - Renal US - LR bolus + LR 100cc/hr - BMP daily - Avoid nephrotoxic agents  Anorexia: Malnutrition:  Hypokalemia:  Patient presents with five months of anorexia with nausea and lethargy that has been worsening over the past month. Patient also endorses subjective fevers and chills with night sweats and weight loss (baseline 101lbs), concern for B symptoms. Today, weight is at baseline. Given concern for anorexia, CT Abdomen/Pelvis obtained which is negative for acute intrabdominal pathology. Given her history of tobacco use, may want to consider CT Chest, although ground glass opacity  in left lower lobe appears to be stable compared to prior studies. She appears frail and chronically ill  appearing on examination. TSH and fT4 wnl. Lipase wnl.  - Consult to dietitician - Phenergen 12.5mg  q6h prn - BMP daily, Mag and phos levels   Hypertension: Patient on HCTZ 27m5mg  daily and losartan 25mg  daily at home. SBP 90-100s on admission in setting of poor PO intake.  - Hold antihypertensives currently.   Hyperlipidemia:  Continue pravastatin 40mg  qHS   FEN/GI: Diet: Renal Fluids: Electrolytes: Monitor and replete prn   DVT Prophylaxis: Heparin 5000U q8h Code status: DNR/DNI  Dispo: Admit patient to Inpatient with expected length of stay greater than 2 midnights.  Signed: Harvie Heck, MD  Internal Medicine, PGY-1 09/24/2019, 1:56 PM  Pager: [phone]

## 2019-09-24 NOTE — ED Notes (Signed)
Taken to CT.

## 2019-09-24 NOTE — Assessment & Plan Note (Signed)
History of dysuria: on and off for the last 3 or 4 months.  Subjective fever (she has no thermometer) she describes hot flashes like symptoms, lack of energy, difficulty sleeping, dysuria, said the antibiotics called in late last week didn't help her.  She reports she felt like one of her last courses of antibiotics helped.  Poor appetite, weight loss.  She has frequent and variable complaints, seeks healthcare at least a few times per month for a myriad of conditions.  I question if there is some underlying psychogenic component to her symptoms.  She has had many urine studies over the last year none of which have been consistent with UTI and has received many courses of antibiotics.    -She needs to be examined in person, we also need lab/urine studies, concern for hyperthyroidism, malignancy, urologic condition.  She would likely benefit from regular follow up as well -She will try to schedule appointment for Friday vs early next week

## 2019-09-24 NOTE — Telephone Encounter (Signed)
Return pt's call - stated "the ambulance is here; I don't know what's wrong with me; I have to go". Sound out of breath when talking.Then she hung up the telephone.

## 2019-09-24 NOTE — Telephone Encounter (Signed)
Requesting to speak with a nurse about having fever, please call pt back.

## 2019-09-25 ENCOUNTER — Ambulatory Visit: Payer: Medicaid Other

## 2019-09-25 DIAGNOSIS — U071 COVID-19: Secondary | ICD-10-CM | POA: Diagnosis not present

## 2019-09-25 DIAGNOSIS — R630 Anorexia: Secondary | ICD-10-CM | POA: Diagnosis not present

## 2019-09-25 DIAGNOSIS — N179 Acute kidney failure, unspecified: Secondary | ICD-10-CM

## 2019-09-25 LAB — BASIC METABOLIC PANEL
Anion gap: 8 (ref 5–15)
BUN: 15 mg/dL (ref 8–23)
CO2: 28 mmol/L (ref 22–32)
Calcium: 8.1 mg/dL — ABNORMAL LOW (ref 8.9–10.3)
Chloride: 103 mmol/L (ref 98–111)
Creatinine, Ser: 0.99 mg/dL (ref 0.44–1.00)
GFR calc Af Amer: >60 mL/min (ref >60)
GFR calc non Af Amer: 60 mL/min — ABNORMAL LOW (ref >60)
Glucose, Bld: 97 mg/dL (ref 70–99)
Potassium: 3.2 mmol/L — ABNORMAL LOW (ref 3.5–5.1)
Sodium: 139 mmol/L (ref 135–145)

## 2019-09-25 LAB — CBC
HCT: 35.1 % — ABNORMAL LOW (ref 36.0–46.0)
Hemoglobin: 11 g/dL — ABNORMAL LOW (ref 12.0–15.0)
MCH: 30 pg (ref 26.0–34.0)
MCHC: 31.3 g/dL (ref 30.0–36.0)
MCV: 95.6 fL (ref 80.0–100.0)
Platelets: 242 10*3/uL (ref 150–400)
RBC: 3.67 MIL/uL — ABNORMAL LOW (ref 3.87–5.11)
RDW: 12.3 % (ref 11.5–15.5)
WBC: 4 10*3/uL (ref 4.0–10.5)
nRBC: 0 % (ref 0.0–0.2)

## 2019-09-25 LAB — FERRITIN: Ferritin: 219 ng/mL (ref 11–307)

## 2019-09-25 LAB — D-DIMER, QUANTITATIVE: D-Dimer, Quant: 0.39 ug/mL-FEU (ref 0.00–0.50)

## 2019-09-25 LAB — PHOSPHORUS: Phosphorus: 3.1 mg/dL (ref 2.5–4.6)

## 2019-09-25 LAB — MAGNESIUM: Magnesium: 2 mg/dL (ref 1.7–2.4)

## 2019-09-25 LAB — C-REACTIVE PROTEIN: CRP: 0.8 mg/dL (ref <1.0)

## 2019-09-25 MED ORDER — POTASSIUM CHLORIDE CRYS ER 20 MEQ PO TBCR
40.0000 meq | EXTENDED_RELEASE_TABLET | Freq: Two times a day (BID) | ORAL | Status: DC
  Administered 2019-09-25: 40 meq via ORAL
  Filled 2019-09-25: qty 2

## 2019-09-25 NOTE — Progress Notes (Signed)
   Subjective: HD 1 Overnight, no acute events reported.  Patient evaluated at bedside. She notes that she feels much better today. She was able to have breakfast and tolerated it well. Patient is eager to go home today. Discussed discharge plans and recommended for quarantine at home, adequate nutrition, and follow-up with PCP.  Objective:  Vital signs in last 24 hours: Vitals:   09/25/19 0500 09/25/19 0600 09/25/19 0630 09/25/19 0700  BP: (!) 96/55 104/67 95/61 107/68  Pulse: 81  82 78  Resp:  13 16 12   Temp:      TempSrc:      SpO2: 100%  94% 99%  Weight:      Height:       CBC Latest Ref Rng & Units 09/25/2019 09/24/2019 09/24/2019  WBC 4.0 - 10.5 K/uL 4.0 - 6.0  Hemoglobin 12.0 - 15.0 g/dL 11.0(L) 13.9 13.4  Hematocrit 36.0 - 46.0 % 35.1(L) 41.0 42.7  Platelets 150 - 400 K/uL 242 - 250   CMP Latest Ref Rng & Units 09/25/2019 09/24/2019 09/24/2019  Glucose 70 - 99 mg/dL 97 - -  BUN 8 - 23 mg/dL 15 - -  Creatinine 0.44 - 1.00 mg/dL 0.99 - 2.50(H)  Sodium 135 - 145 mmol/L 139 140 -  Potassium 3.5 - 5.1 mmol/L 3.2(L) 3.1(L) -  Chloride 98 - 111 mmol/L 103 - -  CO2 22 - 32 mmol/L 28 - -  Calcium 8.9 - 10.3 mg/dL 8.1(L) - -  Total Protein 6.5 - 8.1 g/dL - - -  Total Bilirubin 0.3 - 1.2 mg/dL - - -  Alkaline Phos 38 - 126 U/L - - -  AST 15 - 41 U/L - - -  ALT 0 - 44 U/L - - -   PHYSICAL EXAM: Constitutional: Chronically ill appearing female, eating lunch and no acute distress HENT: [place]/AT, EOMI, moist mucus membranes Cardiovascular: irregular rhythm, normal rate, S1 and S2 present, no m/r/g Respiratory: clear to auscultation bilaterally, no wheezing or rhonchi GI/Abdomen: nondistended, soft, nontender, normoactive bowel sounds Neurologic: awake, alert and oriented x3, no focal deficits noted MSK: spontaneously moving all extremities Skin: no rashes or skin lesions noted   Assessment/Plan: [name] is a 64 year old female with PMHx of hypertension, hyperlipidemia,  anemia, GERD and depression admitted for COVID 19 infection with AKI.   COVID 19 Infection: Patient presented with anorexia and lethargy that has been worsening over past week. Noted to be COVID positive on testing. Inflammatory markers are wnl. She received one dose of remdesivir. Given that she is saturating well on room air, and currently asymptomatic, no further treatment needed and stable for discharge to home. - F/u with PCP - Recommended for continued quarantine and symptomatic treatment   AKI:  Patient noted to have AKI in setting of decreased oral intake. Improved with fluid resuscitation and sCr is back to baseline. - Encourage PO intake   Anorexia: Malnutrition: Patient with BMI 17, appears chronically ill. She notes poor nutrition in setting of nausea with decreased energy. Patient is able to tolerate diet today.  - Continue to encourage PO intake - Supplemental protein shakes   Prior to Admission Living Arrangement: Home Anticipated Discharge Location: Home Barriers to Discharge: None  Dispo: Anticipated discharge in approximately 0-1 day(s).   Harvie Heck, MD  Internal Medicine, PGY-1 09/25/2019, 11:35 AM Pager: [phone]

## 2019-09-25 NOTE — Discharge Summary (Signed)
Name: Kimberly [name] MRN: [id number] DOB: 09-Jun-1955 64 y.o. PCP: [name], DO  Date of Admission: 09/24/2019 10:01 AM Date of Discharge: 09/25/2019 Attending Physician: Dr. Lenice Pressman  Discharge Diagnosis: 1. COVID-19 Infection 2. AKI 3. Anorexia/Malnutrition  Discharge Medications: Allergies as of 09/25/2019      Reactions   Banana    Lip swelling   Grape (artificial) Flavor Swelling   Allergic to grapes   Ibuprofen Rash   Penicillins Rash   Did it involve swelling of the face/tongue/throat, SOB, or low BP? No Did it involve sudden or severe rash/hives, skin peeling, or any reaction on the inside of your mouth or nose? No Did you need to seek medical attention at a hospital or doctor's office? No When did it last happen?more than 10 years If all above answers are "NO", may proceed with cephalosporin use.      Medication List    STOP taking these medications   baclofen 10 MG tablet Commonly known as: LIORESAL   carbamazepine 200 MG tablet Commonly known as: TEGretol   cefdinir 300 MG capsule Commonly known as: OMNICEF   levocetirizine 5 MG tablet Commonly known as: Xyzal   nitrofurantoin (macrocrystal-monohydrate) 100 MG capsule Commonly known as: MACROBID     TAKE these medications   amitriptyline 25 MG tablet Commonly known as: ELAVIL TAKE 1 TABLET BY MOUTH AT BEDTIME   Ferrous Gluconate 256 (28 Fe) MG Tabs Take 256 mg by mouth daily.   Flovent HFA 110 MCG/ACT inhaler Generic drug: fluticasone Inhale 2 puffs by mouth twice daily   fluticasone 50 MCG/ACT nasal spray Commonly known as: Flonase Place 2 sprays into both nostrils daily.   hydrochlorothiazide 12.5 MG tablet Commonly known as: HYDRODIURIL Take 1 tablet (12.5 mg total) by mouth daily.   iron polysaccharides 150 MG capsule Commonly known as: NIFEREX Take 150 mg by mouth daily. What changed: Another medication with the same name was removed. Continue taking this  medication, and follow the directions you see here.   losartan 25 MG tablet Commonly known as: COZAAR Take 1 tablet (25 mg total) by mouth daily.   pantoprazole 40 MG tablet Commonly known as: PROTONIX Take 1 tablet (40 mg total) by mouth daily.   phenazopyridine 200 MG tablet Commonly known as: PYRIDIUM Take 1 tablet (200 mg total) by mouth 3 (three) times daily.   pravastatin 40 MG tablet Commonly known as: PRAVACHOL TAKE 1 TABLET BY MOUTH ONCE DAILY IN THE EVENING       Disposition and follow-up:   Kimberly [name] was discharged from [facility] in Stable condition.  At the hospital follow up visit please address:  1.  COVID 19 Infection: Recommend for symptomatic treatment and continued quarantine  AKI: Resolved on discharge; f/u BMP as needed  Anorexia/Malnutrition: poor nutrition in setting of nausea and lethargy; continue to encourage PO intake and supplemental protein shakes  2.  Labs / imaging needed at time of follow-up: BMP  3.  Pending labs/ test needing follow-up: None  Follow-up Appointments: Follow-up Information    Seawell, Jaimie A, DO. Schedule an appointment as soon as possible for a visit.   Specialty: Internal Medicine Contact information: [address] [address] [place] [facility] Course by problem list: 1. COVID 19 Infection Patient with history of anorexia, lethargy, persistent GI symptoms since November. She also endorses subjective fevers/chills for this duration as well that  appears to be worsening over past month. Noted to be COVID positive on testing. Inflammatory markers were wnl. She received one dose of remdesivir. Patient saturating well on room air and overall asymptomatic. No further treatment indicated at this time and patient noted to be stable for discharge to home. She is to continue quarantine and encouraged for good PO intake.   2. AKI Patient noted to have AKI with sCr to  2.5 on admission. Urine studies consistent with pre-renal etiology in setting of decreased PO intake. This improved to baseline with fluid resuscitation. Continue to encourage PO intake and f/u BMP.   3. Anorexia/Malnutrition Patient with BMI of 17. She appears chronically ill and endorses poor nutrition in setting of nausea and worsening lethargy. Patient able to tolerate breakfast and lunch during her admission. She notes improvement in her symptoms and feels back to baseline on discharge. Encouraged for PO intake and supplemental protein shakes.   Discharge Vitals:   BP 112/71 (BP Location: Right Arm)   Pulse 71   Temp 98.5 F (36.9 C) (Oral)   Resp 14   Ht 5\' 4"  (1.626 m)   Wt 45.8 kg   LMP 08/21/1982   SpO2 100%   BMI 17.34 kg/m   Pertinent Labs, Studies, and Procedures:  CBC Latest Ref Rng & Units 09/25/2019 09/24/2019 09/24/2019  WBC 4.0 - 10.5 K/uL 4.0 - 6.0  Hemoglobin 12.0 - 15.0 g/dL 11.0(L) 13.9 13.4  Hematocrit 36.0 - 46.0 % 35.1(L) 41.0 42.7  Platelets 150 - 400 K/uL 242 - 250   BMP Latest Ref Rng & Units 09/25/2019 09/24/2019 09/24/2019  Glucose 70 - 99 mg/dL 97 - -  BUN 8 - 23 mg/dL 15 - -  Creatinine 0.44 - 1.00 mg/dL 0.99 - 2.50(H)  BUN/Creat Ratio 12 - 28 - - -  Sodium 135 - 145 mmol/L 139 140 -  Potassium 3.5 - 5.1 mmol/L 3.2(L) 3.1(L) -  Chloride 98 - 111 mmol/L 103 - -  CO2 22 - 32 mmol/L 28 - -  Calcium 8.9 - 10.3 mg/dL 8.1(L) - -   SARS Coronavirus 2 by RT PCR NEGATIVE POSITIVEAbnormal     Lipase 11 - 51 U/L 22    Troponin I (High Sensitivity) <18 ng/L 8    TSH 0.350 - 4.500 uIU/mL 2.090    Lactic Acid, Venous 0.5 - 1.9 mmol/L 2.4High Panic    Lactic Acid, Venous 0.5 - 1.9 mmol/L 2.3High Panic     Magnesium 1.7 - 2.4 mg/dL 2.4    Sodium, Ur mmol/L 46    Creatinine, Urine mg/dL 406.54    Ferritin 11 - 307 ng/mL 219    CRP <1.0 mg/dL 0.8    D-Dimer, Quant 0.00 - 0.50 ug/mL-FEU 0.39    Discharge Instructions: Discharge Instructions     MyChart COVID-19 home monitoring program   Complete by: Sep 25, 2019    Is the patient willing to use the MyChart Mobile App for home monitoring?: Yes   Temperature monitoring   Complete by: Sep 25, 2019    After how many days would you like to receive a notification of this patient's flowsheet entries?: 1   Call MD for:  difficulty breathing, headache or visual disturbances   Complete by: As directed    Call MD for:  extreme fatigue   Complete by: As directed    Call MD for:  hives   Complete by: As directed    Call MD for:  persistant dizziness or light-headedness  Complete by: As directed    Call MD for:  persistant nausea and vomiting   Complete by: As directed    Call MD for:  severe uncontrolled pain   Complete by: As directed    Call MD for:  temperature >100.4   Complete by: As directed    Diet - low sodium heart healthy   Complete by: As directed    Discharge instructions   Complete by: As directed    Kimberly [name],   You were admitted with decreased appetite, energy and nausea and diarrhea. You were noted to be very dehydrated and were given fluids with improvement in your symptoms. Your COVID is also positive. On discharge, please continue to hydrate adequately and make sure you are eating well. Recommend Ensure daily and Pedialyte.  Please follow up with your PCP.   Thank you!   Increase activity slowly   Complete by: As directed       Signed: Harvie Heck, MD 09/30/2019, 8:57 AM   Pager: [phone]

## 2019-09-25 NOTE — Social Work (Signed)
3:18pm CSW received request for assistance with transportation for this Pt  After several calls to tranportation department CSW was able to arrange for Pt to leave via [facility] [employer].  5:13 Transport arrived to pick up Pt.

## 2019-09-28 NOTE — Progress Notes (Signed)
Internal Medicine Clinic Attending  Case discussed with Dr. Winfrey  at the time of the visit.  We reviewed the resident's history and exam and pertinent patient test results.  I agree with the assessment, diagnosis, and plan of care documented in the resident's note.  

## 2019-09-30 ENCOUNTER — Telehealth: Payer: Self-pay

## 2019-09-30 NOTE — Telephone Encounter (Signed)
Requesting to speak with Dr. Sharon Seller, please call pt back.

## 2019-10-04 ENCOUNTER — Other Ambulatory Visit: Payer: Self-pay | Admitting: Internal Medicine

## 2019-10-04 DIAGNOSIS — F32A Depression, unspecified: Secondary | ICD-10-CM

## 2019-10-04 DIAGNOSIS — F329 Major depressive disorder, single episode, unspecified: Secondary | ICD-10-CM

## 2019-10-07 ENCOUNTER — Ambulatory Visit (INDEPENDENT_AMBULATORY_CARE_PROVIDER_SITE_OTHER): Payer: Medicare Other | Admitting: Internal Medicine

## 2019-10-07 ENCOUNTER — Other Ambulatory Visit: Payer: Self-pay

## 2019-10-07 ENCOUNTER — Encounter: Payer: Medicaid Other | Admitting: Internal Medicine

## 2019-10-07 DIAGNOSIS — H9203 Otalgia, bilateral: Secondary | ICD-10-CM

## 2019-10-07 NOTE — Progress Notes (Signed)
  [facility] Internal Medicine Residency Telephone Encounter Continuity Care Appointment  HPI:   This telephone encounter was created for [name] on 10/07/2019 for the following purpose/cc regarding ear pain.    Past Medical History:  Past Medical History:  Diagnosis Date  . Alcohol abuse, in remission    since around 2005  . Alcoholic hepatitis    fatty liver on imaging- Treated  . Allergic rhinitis   . Anemia    EGD with duodenal AVM, normal colonoscopy 04/2012  . Asthma   . Callus of foot 2009  . Chest pain, musculoskeletal 2011  . Cholelithiasis    [place] 09/2008: Cholelithiasis is present, s/p cholecystectomy  . Depression   . Excessive cerumen in ear canal    since before 2000  . GERD (gastroesophageal reflux disease)   . History of blood transfusion   . Hyperlipidemia   . Hypertension   . Menopausal syndrome    Treated with estradiol in the past - discontinued 04/2012  . MENOPAUSAL SYNDROME 09/24/2006   2008 Note : The pt is adament about having this medication.  She fully understands the increased risk of heart disease and cancer that is associated with HRT and is willing to accept this risk in order to prevent the debilitating hot flashes she has off this medication.  Will continue to encourage her to try to wean herself off of this medication at our next visit.  2009 note indicated that she tr  . Otitis externa, acute Feb 2012   bilateral, treated with azithromycin after failure of neomycin drops  . Otitis media, acute Feb 2012  . PONV (postoperative nausea and vomiting)       ROS:   Per HPI   Assessment / Plan / Recommendations:   Please see A&P under problem oriented charting for assessment of the patient's acute and chronic medical conditions.   As always, pt is advised that if symptoms worsen or new symptoms arise, they should go to an urgent care facility or to to ER for further evaluation.   Consent and Medical Decision Making:   Patient  discussed with Dr. Rebeca Alert  This is a telephone encounter between Kimberly [name] and Kimberly [name] on 10/07/2019 for bilateral ear pain. The visit was conducted with the patient located at home and Kimberly [name] at [facility]. The patient's identity was confirmed using their DOB and current address. The patient has consented to being evaluated through a telephone encounter and understands the associated risks (an examination cannot be done and the patient may need to come in for an appointment) / benefits (allows the patient to remain at home, decreasing exposure to coronavirus). I personally spent 15 minutes on medical discussion.

## 2019-10-07 NOTE — Assessment & Plan Note (Addendum)
Patient calls regarding bilateral ear pain that is worse on the left and has been present for the past few months that is worsened with chewing.  She feels that she has some ear drainage from that side. Denies sneezing, runny nose, hoarseness, fevers/chills, nausea/vomiting, dizziness.  The patient has been evaluated regarding the symptom several times.  She was most recently seen by [facility] in April 2021 during which time she was told to see Dr. Toy Cookey TMJ specialist.  Of note the patient has a positive for COVID-19 on 09/24/2019.  Assessment and plan CT head April 2021 did not show any changes that could explain the ear pain..  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  Recommend using acetaminophen for pain relief along with warm compresses and following up with Dr. Toy Cookey.  If her symptoms persist the patient should return to clinic for physical examination that can help with diagnosis.

## 2019-10-08 NOTE — Progress Notes (Signed)
Internal Medicine Clinic Attending  Case discussed with Dr. Chundi at the time of the visit.  We reviewed the resident's history and exam and pertinent patient test results.  I agree with the assessment, diagnosis, and plan of care documented in the resident's note.  Roselle Norton, M.D., Ph.D.  

## 2019-10-08 NOTE — Telephone Encounter (Signed)
Spoke with her last week. She just wanted to check in.

## 2019-10-09 ENCOUNTER — Telehealth: Payer: Self-pay | Admitting: Internal Medicine

## 2019-10-09 NOTE — Telephone Encounter (Signed)
Pls contact pt [phone]

## 2019-10-09 NOTE — Telephone Encounter (Signed)
Called pt - she asked if it's ok to buy ice/heat pack for her ear pain as instruct ed by Dr Maricela Bo. Told her it's ok just be sure not to get it hot, only warm, when she place it in the microwave and use a towel. Voiced understanding.

## 2019-10-16 ENCOUNTER — Other Ambulatory Visit: Payer: Self-pay

## 2019-10-16 ENCOUNTER — Encounter: Payer: Self-pay | Admitting: Internal Medicine

## 2019-10-16 ENCOUNTER — Ambulatory Visit (INDEPENDENT_AMBULATORY_CARE_PROVIDER_SITE_OTHER): Payer: Medicare Other | Admitting: Internal Medicine

## 2019-10-16 VITALS — BP 201/106 | HR 83 | Temp 97.7°F | Ht 64.0 in | Wt 97.6 lb

## 2019-10-16 DIAGNOSIS — F32A Depression, unspecified: Secondary | ICD-10-CM

## 2019-10-16 DIAGNOSIS — H9203 Otalgia, bilateral: Secondary | ICD-10-CM

## 2019-10-16 MED ORDER — AMITRIPTYLINE HCL 50 MG PO TABS: 50.0000 mg | ORAL_TABLET | Freq: Every day | ORAL | 1 refills | Status: DC

## 2019-10-16 NOTE — Patient Instructions (Signed)
Kimberly [name],   It was a pleasure seeing you in clinic. Today we discussed:   Ear pain: At this time, I will increase your amitriptyline dose to help with your ear pain as I believe this is secondary to TMJ (see below). It will also be important to schedule an appointment with the TMJ specialist, Dr. Toy Cookey, if you continue to experience symptoms as she may be able to provide further interventions to help with this including spacers and/or injections.   COVID: I am glad you are feeling better from this. Please schedule a tim to get your vaccine at your earliest convenience.   If you have any questions or concerns, please call our clinic at [phone] between 9am-5pm and after hours call [phone] and ask for the internal medicine resident on call. If you feel you are having a medical emergency please call 911.   Thank you, we look forward to helping you remain healthy!  To schedule an appointment for a COVID vaccine or be added to the vaccine wait list: Go to [email or web address]   OR Go to [email or web address]                  OR Call [phone]                                     OR Call [phone] and select Option 2     Temporomandibular Joint Syndrome  Temporomandibular joint syndrome (TMJ syndrome) is a condition that causes pain in the temporomandibular joints. These joints are located near your ears and allow your jaw to open and close. For people with TMJ syndrome, chewing, biting, or other movements of the jaw can be difficult or painful. TMJ syndrome is often mild and goes away within a few weeks. However, sometimes the condition becomes a long-term (chronic) problem. What are the causes? This condition may be caused by:  Grinding your teeth or clenching your jaw. Some people do this when they are under stress.  Arthritis.  Injury to the jaw.  Head or neck injury.  Teeth or dentures that are not aligned well. In some cases, the cause of TMJ  syndrome may not be known. What are the signs or symptoms? The most common symptom of this condition is an aching pain on the side of the head in the area of the TMJ. Other symptoms may include:  Pain when moving your jaw, such as when chewing or biting.  Being unable to open your jaw all the way.  Making a clicking sound when you open your mouth.  Headache.  Earache.  Neck or shoulder pain. How is this diagnosed? This condition may be diagnosed based on:  Your symptoms and medical history.  A physical exam. Your health care provider may check the range of motion of your jaw.  Imaging tests, such as X-rays or an MRI. You may also need to see your dentist, who will determine if your teeth and jaw are lined up correctly. How is this treated? TMJ syndrome often goes away on its own. If treatment is needed, the options may include:  Eating soft foods and applying ice or heat.  Medicines to relieve pain or inflammation.  Medicines or massage to relax the muscles.  A splint, bite plate, or mouthpiece to prevent teeth grinding or jaw clenching.  Relaxation techniques or counseling to help reduce stress.  A therapy for pain in which an  electrical current is applied to the nerves through the skin (transcutaneous electrical nerve stimulation).  Acupuncture. This is sometimes helpful to relieve pain.  Jaw surgery. This is rarely needed. Follow these instructions at home:  Eating and drinking  Eat a soft diet if you are having trouble chewing.  Avoid foods that require a lot of chewing. Do not chew gum. General instructions  Take over-the-counter and prescription medicines only as told by your health care provider.  If directed, put ice on the painful area. ? Put ice in a plastic bag. ? Place a towel between your skin and the bag. ? Leave the ice on for 20 minutes, 2-3 times a day.  Apply a warm, wet cloth (warm compress) to the painful area as directed.  Massage your  jaw area and do any jaw stretching exercises as told by your health care provider.  If you were given a splint, bite plate, or mouthpiece, wear it as told by your health care provider.  Keep all follow-up visits as told by your health care provider. This is important. Contact a health care provider if:  You are having trouble eating.  You have new or worsening symptoms. Get help right away if:  Your jaw locks open or closed. Summary  Temporomandibular joint syndrome (TMJ syndrome) is a condition that causes pain in the temporomandibular joints. These joints are located near your ears and allow your jaw to open and close.  TMJ syndrome is often mild and goes away within a few weeks. However, sometimes the condition becomes a long-term (chronic) problem.  Symptoms include an aching pain on the side of the head in the area of the TMJ, pain when chewing or biting, and being unable to open your jaw all the way. You may also make a clicking sound when you open your mouth.  TMJ syndrome often goes away on its own. If treatment is needed, it may include medicines to relieve pain, reduce inflammation, or relax the muscles. A splint, bite plate, or mouthpiece may also be used to prevent teeth grinding or jaw clenching. This information is not intended to replace advice given to you by your health care provider. Make sure you discuss any questions you have with your health care provider. Document Revised: 07/26/2017 Document Reviewed: 06/25/2017 Elsevier Patient Education  2020 [employer].

## 2019-10-16 NOTE — Progress Notes (Signed)
CC: bilateral ear pain  HPI:  [name] is a 64 y.o. female with past medical history as listed below presenting for bilateral ear pain that appears to be chronic in nature however, worsening over the past several months.  Please see encounters tab for full assessment and plan.   Past Medical History:  Diagnosis Date  . Alcohol abuse, in remission    since around 2005  . Alcoholic hepatitis    fatty liver on imaging- Treated  . Allergic rhinitis   . Anemia    EGD with duodenal AVM, normal colonoscopy 04/2012  . Asthma   . Callus of foot 2009  . Chest pain, musculoskeletal 2011  . Cholelithiasis    [place] 09/2008: Cholelithiasis is present, s/p cholecystectomy  . Depression   . Excessive cerumen in ear canal    since before 2000  . GERD (gastroesophageal reflux disease)   . History of blood transfusion   . Hyperlipidemia   . Hypertension   . Menopausal syndrome    Treated with estradiol in the past - discontinued 04/2012  . MENOPAUSAL SYNDROME 09/24/2006   2008 Note : The pt is adament about having this medication.  She fully understands the increased risk of heart disease and cancer that is associated with HRT and is willing to accept this risk in order to prevent the debilitating hot flashes she has off this medication.  Will continue to encourage her to try to wean herself off of this medication at our next visit.  2009 note indicated that she tr  . Otitis externa, acute Feb 2012   bilateral, treated with azithromycin after failure of neomycin drops  . Otitis media, acute Feb 2012  . PONV (postoperative nausea and vomiting)    Review of Systems: Negative except as stated in HPI.  Physical Exam:  Vitals:   10/16/19 0908 10/16/19 0909  BP:  (!) 100/55  Pulse:  73  Temp:  97.7 F (36.5 C)  TempSrc:  Oral  SpO2:  100%  Weight: 97 lb 9.6 oz (44.3 kg)   Height: 5\' 4"  (1.626 m)    Physical Exam Constitutional:      Appearance: Normal appearance. She is not  ill-appearing or diaphoretic.  HENT:     Head: Normocephalic and atraumatic.     Right Ear: Tympanic membrane, ear canal and external ear normal. There is no impacted cerumen.     Left Ear: Tympanic membrane, ear canal and external ear normal. There is no impacted cerumen.     Ears:     Comments: No abnormal conduction on Rhinne test or lateralization noted on Weber test     Mouth/Throat:     Mouth: Mucous membranes are moist.     Pharynx: Oropharynx is clear. No oropharyngeal exudate or posterior oropharyngeal erythema.  Eyes:     General: No scleral icterus.    Extraocular Movements: Extraocular movements intact.     Conjunctiva/sclera: Conjunctivae normal.  Cardiovascular:     Rate and Rhythm: Normal rate and regular rhythm.     Pulses: Normal pulses.     Heart sounds: Normal heart sounds. No murmur.  Pulmonary:     Effort: Pulmonary effort is normal. No respiratory distress.     Breath sounds: Normal breath sounds. No wheezing.  Musculoskeletal:        General: No swelling or tenderness. Normal range of motion.     Cervical back: Normal range of motion and neck supple. No rigidity or tenderness.  Lymphadenopathy:  Cervical: No cervical adenopathy.  Skin:    General: Skin is warm and dry.  Neurological:     General: No focal deficit present.     Mental Status: She is alert and oriented to person, place, and time. Mental status is at baseline.      Assessment & Plan:   See Encounters Tab for problem based charting.  Patient discussed with Dr. Lynnae January

## 2019-10-16 NOTE — Assessment & Plan Note (Addendum)
Patient presenting with approximately 3 months of bilateral ear pain that is worse on the left.  On chart review, this appears to be a chronic issue that started 03/18/2019 following her colonoscopy.  Patient does endorse some congestion in setting of her normal allergies; however, denies any drainage or hearing loss at this time.  She also denies any fevers or dizziness.  She notes that the pain is slightly worsened with chewing.  She has not been able to see Dr. Toy Cookey due to cost.  On examination, no abnormalities noted on external ear exam, no erythema noted in the ear canal.  Tympanic membranes normal bilaterally.  Normal Weber and Rhinne tests.  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  Recommended for warm compresses, over-the-counter pain relief and follow-up with ENT and TMJ specialist. Patient voices understanding.  Plan Warm compresses, massage, conservative management with warm compresses, massage, over-the-counter pain meds  Referral to TMJ specialist

## 2019-10-17 ENCOUNTER — Telehealth: Payer: Self-pay | Admitting: Internal Medicine

## 2019-10-17 NOTE — Telephone Encounter (Signed)
   Reason for call:   I received a call from [name] at 9:55 AM indicating bilateral ear pain.   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 She went to doctor yesterday and reports that she is still having pain on both sides of her ears. This has been going on for about 3 months now. Exam done yesterday appears unremarkable. Her amitriptyline dose was increased to 50 mg nightly. She took the increased dose of amitryptiline last night and she reports that she is still having ear pain and was wanting to know what could be done about it. We discussed that the medication can take time to take effect and that she needs to follow up with Dr. Toy Cookey, DDS who specializes in TMJ, to further manage this. On chart review she saw ENT on 09/03/19, provided number for Dr. Toy Cookey at that time. She asked for the number again and when I provided her with the number she reported that she had already contacted them and that it was too expensive to be seen.   She is denying any other symptoms at this time, including fevers, chills, or dizziness.    Assessment / Plan / Recommendations:   Appears to still be related to TMJ.  Went over general information regarding TMJ, including using warm compresses, soft diet, and avoiding gum chewing. Continue the increased dose of amitriptyline 50 mg daily. Provided information for Dr. Betsey Holiday office, discussed that she should speak with them about the payment options.   As always, pt is advised that if symptoms worsen or new symptoms arise, they should go to an urgent care facility or to to ER for further evaluation.   Kimberly Noble, MD   10/17/2019, 9:55 AM

## 2019-10-19 ENCOUNTER — Telehealth: Payer: Self-pay | Admitting: Internal Medicine

## 2019-10-19 NOTE — Telephone Encounter (Signed)
Pt states nothing is helping her pain, states she wont be able to see dentist until June, she does states that increasing her night medicine did help and she sleeps well but when she wakes it still hurts, she would like some med to make it go away completely. Explained to pt nothing would make it go away completely. Spoke to her about relaxation exercises, massage, compresses, warm showers, deep breathing. Ask her to read about relaxation on websites for relaxation. She was agreeable and says thank you

## 2019-10-19 NOTE — Telephone Encounter (Signed)
Patient requesting a call back about her visit on 10/16/2019.  Please call the patient back.

## 2019-10-20 NOTE — Progress Notes (Signed)
Internal Medicine Clinic Attending  Case discussed with Dr. Marva Panda at the time of the visit.  We reviewed the resident's history and exam and pertinent patient test results.  I agree with the assessment, diagnosis, and plan of care documented in the resident's note.

## 2019-10-20 NOTE — Telephone Encounter (Signed)
Thank you, agree with plan. She also needs to follow up with TMJ specialist for her pain as recommended in clinic.

## 2019-10-21 ENCOUNTER — Ambulatory Visit: Payer: Medicare Other | Admitting: *Deleted

## 2019-10-21 DIAGNOSIS — E785 Hyperlipidemia, unspecified: Secondary | ICD-10-CM

## 2019-10-21 DIAGNOSIS — I10 Essential (primary) hypertension: Secondary | ICD-10-CM

## 2019-10-21 NOTE — Chronic Care Management (AMB) (Signed)
  Chronic Care Management   Note  10/21/2019 Name: Kimberly [name] MRN: [id number] DOB: [birth year]   Successful outreach to patient. Discussed chronic care management program and reason for referral to the program. Patient states she has an appointment to evaluate her jaw pain on 10/27/19. With patient agreement, will schedule initial intake visit after her 10/27/19 appointment.  Follow up plan: Telephone follow up appointment with care management team member scheduled for:10/29/19 at 9:00 am per patient request.  Kelli Churn RN, CCM, [facility] RN Care Manager [phone]

## 2019-10-28 ENCOUNTER — Other Ambulatory Visit: Payer: Self-pay | Admitting: Internal Medicine

## 2019-10-28 ENCOUNTER — Encounter: Payer: Medicare Other | Admitting: Internal Medicine

## 2019-10-28 DIAGNOSIS — I10 Essential (primary) hypertension: Secondary | ICD-10-CM

## 2019-10-29 ENCOUNTER — Ambulatory Visit: Payer: Medicare Other | Admitting: *Deleted

## 2019-10-29 ENCOUNTER — Other Ambulatory Visit: Payer: Self-pay | Admitting: Internal Medicine

## 2019-10-29 DIAGNOSIS — E785 Hyperlipidemia, unspecified: Secondary | ICD-10-CM

## 2019-10-29 DIAGNOSIS — I10 Essential (primary) hypertension: Secondary | ICD-10-CM

## 2019-10-29 NOTE — Chronic Care Management (AMB) (Signed)
  Chronic Care Management   Note  10/29/2019 Name: Kimberly [name] MRN: [id number] DOB: 11/01/1955  Successful outreach to patient regarding assisting her with referral to specialist to evaluate jaw pain. Patient states she saw Dr Orlie Dakin on 6/1 and has been referred to Dr Diona Browner Oral and Maxillofacial Surgeon. She states she will see Dr. Hoyt Koch on 11/10/19. Ensured patient has transportation to the appointment. She says her jaw pain is better. Says she is edentulous and wears dentures during the day so she doesn't think the cause  of the jaw pain is nocturnal teeth grinding.   Follow up plan: No further follow up required. After more extensive discussion with patient about CCM program, she is not interested in CCM program at this time. Says she might be interested once her jaw pain is resolved.  Kelli Churn RN, CCM, [facility] RN Care Manager [phone]

## 2019-10-29 NOTE — Progress Notes (Signed)
Internal Medicine Clinic Resident  I have personally reviewed this encounter including the documentation in this note and/or discussed this patient with the care management provider. I will address any urgent items identified by the care management provider and will communicate my actions to the patient's PCP. I have reviewed the patient's CCM visit with my supervising attending, Dr Lynnae January.  Earlene Plater, MD 10/29/2019

## 2019-11-04 ENCOUNTER — Encounter: Payer: Medicare Other | Admitting: Internal Medicine

## 2019-11-18 ENCOUNTER — Encounter: Payer: Medicare Other | Admitting: Internal Medicine

## 2019-11-20 ENCOUNTER — Other Ambulatory Visit: Payer: Self-pay | Admitting: Internal Medicine

## 2019-11-20 DIAGNOSIS — R0982 Postnasal drip: Secondary | ICD-10-CM

## 2019-11-20 DIAGNOSIS — J329 Chronic sinusitis, unspecified: Secondary | ICD-10-CM

## 2019-11-20 DIAGNOSIS — T7840XD Allergy, unspecified, subsequent encounter: Secondary | ICD-10-CM

## 2019-11-20 MED ORDER — FLUTICASONE PROPIONATE 50 MCG/ACT NA SUSP: 2.0000 | Freq: Every day | NASAL | 1 refills | Status: DC | PRN

## 2019-11-20 MED ORDER — CHLORASEPTIC MAX SORE THROAT 1.5-33 % MT LIQD: 2.0000 | Freq: Four times a day (QID) | OROMUCOSAL | 0 refills | Status: DC | PRN

## 2019-11-23 NOTE — Progress Notes (Signed)
   Reason for call:   I received a call from [name] at 5  PM indicating sore throat.   Pertinent Data:   She has been having sore throat for one week with pain with swallowing and in the glands on each side. She denies fever, chills, nausea, dizziness, cough, shortness of breath or difficulty swallowing.   She has a lot of allergies and congestion that is worse at night.   She was previously hospitalized for covid in April.   She takes zyrtec as needed for allergies    Assessment / Plan / Recommendations:   Symptoms consistent with allergic rhinitis and post-nasal drip. She is already taking zyrtec prn. Discussed that she should take this daily with her current symptoms. Chlorseptic spray and flonase ordered for congestion, sore throat, and post-nasal drip.   She will follow-up with clinic next week if symptoms don't improve.   As always, pt is advised that if symptoms worsen or new symptoms arise, they should go to an urgent care facility or to to ER for further evaluation.   Kimberly [name] A, DO   11/23/2019, 10:32 AM

## 2019-11-25 ENCOUNTER — Encounter: Payer: Medicare Other | Admitting: Internal Medicine

## 2019-11-26 ENCOUNTER — Other Ambulatory Visit: Payer: Self-pay | Admitting: Internal Medicine

## 2019-11-26 DIAGNOSIS — E785 Hyperlipidemia, unspecified: Secondary | ICD-10-CM

## 2019-11-30 ENCOUNTER — Other Ambulatory Visit: Payer: Self-pay | Admitting: Internal Medicine

## 2019-11-30 DIAGNOSIS — E785 Hyperlipidemia, unspecified: Secondary | ICD-10-CM

## 2019-12-07 ENCOUNTER — Other Ambulatory Visit: Payer: Self-pay | Admitting: Internal Medicine

## 2019-12-07 DIAGNOSIS — F32A Depression, unspecified: Secondary | ICD-10-CM

## 2019-12-10 ENCOUNTER — Other Ambulatory Visit: Payer: Self-pay | Admitting: Internal Medicine

## 2019-12-10 DIAGNOSIS — F32A Depression, unspecified: Secondary | ICD-10-CM

## 2019-12-14 ENCOUNTER — Telehealth: Payer: Self-pay | Admitting: *Deleted

## 2019-12-14 ENCOUNTER — Encounter: Payer: Medicare Other | Admitting: Internal Medicine

## 2019-12-14 ENCOUNTER — Telehealth: Payer: Self-pay | Admitting: Gastroenterology

## 2019-12-14 NOTE — Telephone Encounter (Signed)
The pt has continued bloating despite gas ex and PPI.  She has not been seen since 9/20.  Appt made to see Dr Ardis Hughs to discuss.

## 2019-12-14 NOTE — Telephone Encounter (Signed)
The pt has been advised to try gas ex and follow a bland diet until her appt. The pt has been advised of the information and verbalized understanding.

## 2019-12-14 NOTE — Telephone Encounter (Signed)
Call to patient about missed appointment.  Stated not coming took a laxative.  Sander Nephew, RN 12/14/2019 9:17 AM.

## 2019-12-14 NOTE — Telephone Encounter (Signed)
Pt is requesting a call back from a nurse in regards to what medication could she take in the meantime.

## 2019-12-20 ENCOUNTER — Other Ambulatory Visit: Payer: Self-pay | Admitting: Internal Medicine

## 2019-12-20 DIAGNOSIS — E785 Hyperlipidemia, unspecified: Secondary | ICD-10-CM

## 2019-12-21 NOTE — Telephone Encounter (Signed)
Next Appt With Internal Medicine Laverda Page Sena Hitch, MD) 12/25/2019 at 2:15 PM

## 2019-12-25 ENCOUNTER — Encounter: Payer: Medicare Other | Admitting: Student

## 2020-01-04 ENCOUNTER — Other Ambulatory Visit: Payer: Self-pay | Admitting: Internal Medicine

## 2020-01-04 DIAGNOSIS — F32A Depression, unspecified: Secondary | ICD-10-CM

## 2020-01-05 ENCOUNTER — Other Ambulatory Visit: Payer: Self-pay | Admitting: *Deleted

## 2020-01-05 DIAGNOSIS — I10 Essential (primary) hypertension: Secondary | ICD-10-CM

## 2020-01-06 ENCOUNTER — Telehealth: Payer: Self-pay | Admitting: Gastroenterology

## 2020-01-06 ENCOUNTER — Other Ambulatory Visit: Payer: Self-pay | Admitting: Internal Medicine

## 2020-01-06 DIAGNOSIS — K219 Gastro-esophageal reflux disease without esophagitis: Secondary | ICD-10-CM

## 2020-01-06 DIAGNOSIS — I10 Essential (primary) hypertension: Secondary | ICD-10-CM

## 2020-01-06 MED ORDER — PANTOPRAZOLE SODIUM 40 MG PO TBEC: 40.0000 mg | DELAYED_RELEASE_TABLET | Freq: Every day | ORAL | 3 refills | Status: DC

## 2020-01-06 NOTE — Telephone Encounter (Signed)
She was recently taken off of hydrochlorothiazide due to hypokalemia so I will not refill that.   Will refill protonix for GERD.

## 2020-01-06 NOTE — Telephone Encounter (Signed)
Need refill on hydrochlorothiazide (HYDRODIURIL) 12.5 MG tablet pantoprazole (PROTONIX) 40 MG tablet  ;pt contact [place] ([place]), [place] - [address]

## 2020-01-06 NOTE — Telephone Encounter (Signed)
Pt is requesting a refill on her protonix.

## 2020-01-06 NOTE — Telephone Encounter (Signed)
Patient with hypokalemia present during last visit. Discontinued HCTZ and started on cozaar.

## 2020-01-08 ENCOUNTER — Other Ambulatory Visit: Payer: Self-pay

## 2020-01-08 DIAGNOSIS — I10 Essential (primary) hypertension: Secondary | ICD-10-CM

## 2020-01-08 MED ORDER — HYDROCHLOROTHIAZIDE 12.5 MG PO TABS: 12.5000 mg | ORAL_TABLET | Freq: Every day | ORAL | 1 refills | Status: DC

## 2020-01-08 NOTE — Telephone Encounter (Signed)
Requesting bp med refill @  [place] ([place]), [place] - [address] Phone:  [phone]  Fax:  [phone]

## 2020-01-08 NOTE — Telephone Encounter (Signed)
RTC, pt requesting refill on HCTZ only. SChaplin, RN,BSN

## 2020-01-12 MED ORDER — PANTOPRAZOLE SODIUM 40 MG PO TBEC: 40.0000 mg | DELAYED_RELEASE_TABLET | Freq: Every day | ORAL | 1 refills | Status: DC

## 2020-01-12 NOTE — Telephone Encounter (Signed)
Refill sent.

## 2020-01-22 ENCOUNTER — Other Ambulatory Visit: Payer: Self-pay | Admitting: Internal Medicine

## 2020-01-22 DIAGNOSIS — T7840XD Allergy, unspecified, subsequent encounter: Secondary | ICD-10-CM

## 2020-01-27 ENCOUNTER — Encounter: Payer: Medicare Other | Admitting: Internal Medicine

## 2020-02-06 ENCOUNTER — Other Ambulatory Visit: Payer: Self-pay | Admitting: Student

## 2020-02-06 DIAGNOSIS — F32A Depression, unspecified: Secondary | ICD-10-CM

## 2020-02-08 ENCOUNTER — Encounter: Payer: Medicare Other | Admitting: Internal Medicine

## 2020-02-09 ENCOUNTER — Other Ambulatory Visit: Payer: Self-pay | Admitting: Student

## 2020-02-09 DIAGNOSIS — F32A Depression, unspecified: Secondary | ICD-10-CM

## 2020-02-11 ENCOUNTER — Ambulatory Visit (INDEPENDENT_AMBULATORY_CARE_PROVIDER_SITE_OTHER): Payer: Medicare Other | Admitting: Internal Medicine

## 2020-02-11 ENCOUNTER — Encounter: Payer: Self-pay | Admitting: Internal Medicine

## 2020-02-11 ENCOUNTER — Telehealth: Payer: Self-pay

## 2020-02-11 VITALS — BP 117/89 | HR 101 | Wt 105.5 lb

## 2020-02-11 DIAGNOSIS — I10 Essential (primary) hypertension: Secondary | ICD-10-CM | POA: Diagnosis not present

## 2020-02-11 DIAGNOSIS — S29012A Strain of muscle and tendon of back wall of thorax, initial encounter: Secondary | ICD-10-CM | POA: Diagnosis not present

## 2020-02-11 DIAGNOSIS — D508 Other iron deficiency anemias: Secondary | ICD-10-CM

## 2020-02-11 DIAGNOSIS — E876 Hypokalemia: Secondary | ICD-10-CM | POA: Diagnosis not present

## 2020-02-11 DIAGNOSIS — M545 Low back pain, unspecified: Secondary | ICD-10-CM

## 2020-02-11 DIAGNOSIS — M549 Dorsalgia, unspecified: Secondary | ICD-10-CM

## 2020-02-11 MED ORDER — DICLOFENAC SODIUM 1 % EX GEL: 4.0000 g | Freq: Four times a day (QID) | CUTANEOUS | 0 refills | Status: DC | PRN

## 2020-02-11 NOTE — Patient Instructions (Addendum)
Thank you for allowing [place] to provide your care today. Today we discussed your upper right back pain  I have ordered the following labs for you:  Complete blood count, complete metabolic panel    I will call if any are abnormal.    Today we made the following changes to your medications:   Please START taking  Alternative ice and heat to the area   Take tylenol every 6 hours as needed for upper left back pain.   Alternate voltaren gel and biofreeze   If symptoms worsen or fail to improve over the next two weeks, please return to the clinic.   Please follow-up in two weeks for primary care wellness visit.    Please call the internal medicine center clinic if you have any questions or concerns, we may be able to help and keep you from a long and expensive emergency room wait. Our clinic and after hours phone number is [phone], the best time to call is Monday through Friday 9 am to 4 pm but there is always someone available 24/7 if you have an emergency. If you need medication refills please notify your pharmacy one week in advance and they will send [place] a request.

## 2020-02-11 NOTE — Telephone Encounter (Signed)
Requesting to speak with a nurse about back pain, please call pt back.

## 2020-02-11 NOTE — Progress Notes (Signed)
   CC: right rib pain  HPI:  [name] is a 64 y.o. with PMH as below.   Please see A&P for assessment of the patient's acute and chronic medical conditions.   Past Medical History:  Diagnosis Date  . Alcohol abuse, in remission    since around 2005  . Alcoholic hepatitis    fatty liver on imaging- Treated  . Allergic rhinitis   . Anemia    EGD with duodenal AVM, normal colonoscopy 04/2012  . Asthma   . Callus of foot 2009  . Chest pain, musculoskeletal 2011  . Cholelithiasis    [place] 09/2008: Cholelithiasis is present, s/p cholecystectomy  . Depression   . Excessive cerumen in ear canal    since before 2000  . GERD (gastroesophageal reflux disease)   . History of blood transfusion   . Hyperlipidemia   . Hypertension   . Menopausal syndrome    Treated with estradiol in the past - discontinued 04/2012  . MENOPAUSAL SYNDROME 09/24/2006   2008 Note : The pt is adament about having this medication.  She fully understands the increased risk of heart disease and cancer that is associated with HRT and is willing to accept this risk in order to prevent the debilitating hot flashes she has off this medication.  Will continue to encourage her to try to wean herself off of this medication at our next visit.  2009 note indicated that she tr  . Otitis externa, acute Feb 2012   bilateral, treated with azithromycin after failure of neomycin drops  . Otitis media, acute Feb 2012  . PONV (postoperative nausea and vomiting)    Review of Systems:  10 point ROS negative except as noted in HPI  Physical Exam: Constitution: NAD, appears stated age 21: RRR, no m/r/g, no LE edema  Respiratory: CTA, no w/r/r Abdominal: NTTP, soft, non-distended MSK: TTP over inferior rhomboid major/lower trapezius, no swelling, erythema, full active ROM right arm with small amount tenderness Neuro: normal affect, a&ox3 Skin: c/d/i    Vitals:   02/11/20 1440  BP: 117/89  Pulse: (!) 101  SpO2:  100%  Weight: 105 lb 8 oz (47.9 kg)    Assessment & Plan:   See Encounters Tab for problem based charting.  Patient discussed with Dr. Angelia Mould

## 2020-02-11 NOTE — Telephone Encounter (Signed)
RTC, self-identified VM obtained, [place] compliant VM left that nurse was returning her call and to please call back and ask for any of the triage nurses. SChaplin, RN,BSN

## 2020-02-12 ENCOUNTER — Telehealth: Payer: Self-pay | Admitting: Internal Medicine

## 2020-02-12 DIAGNOSIS — S29012A Strain of muscle and tendon of back wall of thorax, initial encounter: Secondary | ICD-10-CM | POA: Insufficient documentation

## 2020-02-12 LAB — CBC
Hematocrit: 38.1 % (ref 34.0–46.6)
Hemoglobin: 12.6 g/dL (ref 11.1–15.9)
MCH: 31 pg (ref 26.6–33.0)
MCHC: 33.1 g/dL (ref 31.5–35.7)
MCV: 94 fL (ref 79–97)
Platelets: 318 10*3/uL (ref 150–450)
RBC: 4.07 x10E6/uL (ref 3.77–5.28)
RDW: 11.2 % — ABNORMAL LOW (ref 11.7–15.4)
WBC: 6.3 10*3/uL (ref 3.4–10.8)

## 2020-02-12 LAB — CMP14 + ANION GAP
ALT: 26 IU/L (ref 0–32)
AST: 28 IU/L (ref 0–40)
Albumin/Globulin Ratio: 1.8 (ref 1.2–2.2)
Albumin: 4.8 g/dL (ref 3.8–4.8)
Alkaline Phosphatase: 93 IU/L (ref 44–121)
Anion Gap: 18 mmol/L (ref 10.0–18.0)
BUN/Creatinine Ratio: 12 (ref 12–28)
BUN: 9 mg/dL (ref 8–27)
Bilirubin Total: 0.7 mg/dL (ref 0.0–1.2)
CO2: 25 mmol/L (ref 20–29)
Calcium: 10.1 mg/dL (ref 8.7–10.3)
Chloride: 97 mmol/L (ref 96–106)
Creatinine, Ser: 0.77 mg/dL (ref 0.57–1.00)
GFR calc Af Amer: 94 mL/min/{1.73_m2} (ref >59)
GFR calc non Af Amer: 81 mL/min/{1.73_m2} (ref >59)
Globulin, Total: 2.6 g/dL (ref 1.5–4.5)
Glucose: 88 mg/dL (ref 65–99)
Potassium: 4.6 mmol/L (ref 3.5–5.2)
Sodium: 140 mmol/L (ref 134–144)
Total Protein: 7.4 g/dL (ref 6.0–8.5)

## 2020-02-12 NOTE — Telephone Encounter (Signed)
Returned call to patient and discussed results.

## 2020-02-12 NOTE — Assessment & Plan Note (Addendum)
BP Readings from Last 3 Encounters:  02/11/20 117/89  10/16/19 (!) 201/106  09/25/19 112/71   Blood pressure well controlled on HCTZ 12.5 mg and losartan 25 mg.   - bmp  - continue HCTZ, losartan

## 2020-02-12 NOTE — Telephone Encounter (Signed)
Pt is requesting a callback from nurse/Dr Seawell [phone]

## 2020-02-12 NOTE — Assessment & Plan Note (Signed)
She was lifting a heavy iron chair two days ago and afterward started to have pain in her right upper back. Does not radiate and area is TTP. No tingling, no rash. No abdominal pain or chest pain or shortness of breath. She has been using biofreeze, which has helped. Pain is along the lower rhomboid major vs. Lower trapezius. Active ROM with some tenderness with stretching. Likely muscular strain.   - continue biofreeze, alternate with voltaren gel, can use ice and heat - tylenol q6h prn, avoid NSAIDs - f/u if symptoms worsen or fail to improve - f/u for wellness visit two weeks, medications, care gaps

## 2020-02-12 NOTE — Assessment & Plan Note (Signed)
History of IDA. She is on daily iron supplement.   - CBC

## 2020-02-13 ENCOUNTER — Telehealth: Payer: Self-pay | Admitting: Internal Medicine

## 2020-02-13 NOTE — Telephone Encounter (Signed)
   Reason for call:   I received a call from [name] at 4 PM indicating she called [place] to talk to clinic because she thinks she has bladder infection.    Pertinent Data:   No fever or chills. No abdominal pain. No hematuria. Only has some mild dysuria.    Assessment / Plan / Recommendations:   Only has some mild dysuria. She mentions she would like to wait till Tuesday to come to [facility].    I provided urgent care/ED visit precaucions. She denies any sign or symptoms that requires ED visit now. No fever or chills. No abdominal pain. No hematuria. Recommended to be seen in [facility] on Monday (patient would like to come on Tuesday) for assessment, UA/UC and appropriate Tx if needed. She agrees with plan and does not have other request.     As always, pt is advised that if symptoms worsen or new symptoms arise, they should go to an urgent care facility or to to ER for further evaluation.    Dewayne Hatch, MD   02/13/2020, 4:43 PM

## 2020-02-14 ENCOUNTER — Other Ambulatory Visit: Payer: Self-pay

## 2020-02-14 ENCOUNTER — Emergency Department (HOSPITAL_COMMUNITY)
Admission: EM | Admit: 2020-02-14 | Discharge: 2020-02-14 | Disposition: A | Discharge status: Home / Self Care | Payer: Medicare Other | Attending: Emergency Medicine | Admitting: Emergency Medicine

## 2020-02-14 ENCOUNTER — Encounter (HOSPITAL_COMMUNITY): Payer: Self-pay | Admitting: Emergency Medicine

## 2020-02-14 DIAGNOSIS — Z79899 Other long term (current) drug therapy: Secondary | ICD-10-CM | POA: Insufficient documentation

## 2020-02-14 DIAGNOSIS — J45909 Unspecified asthma, uncomplicated: Secondary | ICD-10-CM | POA: Diagnosis not present

## 2020-02-14 DIAGNOSIS — R079 Chest pain, unspecified: Secondary | ICD-10-CM | POA: Diagnosis not present

## 2020-02-14 DIAGNOSIS — Z87891 Personal history of nicotine dependence: Secondary | ICD-10-CM | POA: Diagnosis not present

## 2020-02-14 DIAGNOSIS — N3000 Acute cystitis without hematuria: Secondary | ICD-10-CM | POA: Diagnosis not present

## 2020-02-14 DIAGNOSIS — R300 Dysuria: Secondary | ICD-10-CM | POA: Diagnosis not present

## 2020-02-14 DIAGNOSIS — Z8616 Personal history of COVID-19: Secondary | ICD-10-CM | POA: Diagnosis not present

## 2020-02-14 DIAGNOSIS — R109 Unspecified abdominal pain: Secondary | ICD-10-CM | POA: Diagnosis not present

## 2020-02-14 DIAGNOSIS — G4489 Other headache syndrome: Secondary | ICD-10-CM | POA: Diagnosis not present

## 2020-02-14 DIAGNOSIS — R0789 Other chest pain: Secondary | ICD-10-CM | POA: Diagnosis not present

## 2020-02-14 DIAGNOSIS — I10 Essential (primary) hypertension: Secondary | ICD-10-CM | POA: Diagnosis not present

## 2020-02-14 DIAGNOSIS — Z743 Need for continuous supervision: Secondary | ICD-10-CM | POA: Diagnosis not present

## 2020-02-14 LAB — URINALYSIS, ROUTINE W REFLEX MICROSCOPIC
Bacteria, UA: NONE SEEN
Bilirubin Urine: NEGATIVE
Glucose, UA: NEGATIVE mg/dL
Hgb urine dipstick: NEGATIVE
Ketones, ur: NEGATIVE mg/dL
Nitrite: NEGATIVE
Protein, ur: NEGATIVE mg/dL
Specific Gravity, Urine: 1.012 (ref 1.005–1.030)
pH: 7 (ref 5.0–8.0)

## 2020-02-14 MED ORDER — CEPHALEXIN 500 MG PO CAPS
500.0000 mg | ORAL_CAPSULE | Freq: Four times a day (QID) | ORAL | 0 refills | Status: AC
Start: 2020-02-14 — End: 2020-02-19

## 2020-02-14 NOTE — ED Provider Notes (Signed)
[place] EMERGENCY DEPARTMENT Provider Note   CSN: [id number] Arrival date & time: 02/14/20  2831     History Chief Complaint  Patient presents with  . Dysuria  . Generalized Body Aches    Kimberly [name] is a 64 y.o. female.   Dysuria Pain quality:  Burning Pain severity:  Mild Onset quality:  Gradual Duration:  3 days Timing:  Constant Progression:  Worsening Chronicity:  Recurrent Relieved by:  Nothing Worsened by:  Nothing Ineffective treatments:  None tried Urinary symptoms: frequent urination   Associated symptoms: no fever, no flank pain, no nausea and no vomiting        Past Medical History:  Diagnosis Date  . Alcohol abuse, in remission    since around 2005  . Alcoholic hepatitis    fatty liver on imaging- Treated  . Allergic rhinitis   . Anemia    EGD with duodenal AVM, normal colonoscopy 04/2012  . Asthma   . Callus of foot 2009  . Chest pain, musculoskeletal 2011  . Cholelithiasis    [place] 09/2008: Cholelithiasis is present, s/p cholecystectomy  . Depression   . Excessive cerumen in ear canal    since before 2000  . GERD (gastroesophageal reflux disease)   . History of blood transfusion   . Hyperlipidemia   . Hypertension   . Menopausal syndrome    Treated with estradiol in the past - discontinued 04/2012  . MENOPAUSAL SYNDROME 09/24/2006   2008 Note : The pt is adament about having this medication.  She fully understands the increased risk of heart disease and cancer that is associated with HRT and is willing to accept this risk in order to prevent the debilitating hot flashes she has off this medication.  Will continue to encourage her to try to wean herself off of this medication at our next visit.  2009 note indicated that she tr  . Otitis externa, acute Feb 2012   bilateral, treated with azithromycin after failure of neomycin drops  . Otitis media, acute Feb 2012  . PONV (postoperative nausea and vomiting)     Patient  Active Problem List   Diagnosis Date Noted  . Muscle strain of right upper back 02/12/2020  . COVID-19 09/25/2019  . History of dysuria 09/24/2019  . AKI (acute kidney injury) ([place]) 09/24/2019  . Myalgia 09/07/2019  . Rhinosinusitis 08/27/2019  . Acute right-sided low back pain without sciatica 08/04/2019  . Dysuria 05/25/2019  . Tachycardia 05/25/2019  . Hematuria 05/25/2019  . Ear pain, bilateral 04/27/2019  . 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 Vaginal discharge 01/18/2019  . Abdominal gas pain 12/16/2018  . Pain in right thigh 01/15/2018  . Health care maintenance 03/25/2013  . Depression 12/03/2012  . Iron deficiency anemia 05/08/2012  . HLD (hyperlipidemia) 03/25/2008  . Constipation 11/04/2007  . Essential hypertension 09/24/2006  . Allergies 09/24/2006  . Asthma 09/24/2006  . GERD 09/24/2006    Past Surgical History:  Procedure Laterality Date  . ABDOMINAL HYSTERECTOMY  1980s  . BIOPSY  03/18/2019   Procedure: BIOPSY;  Surgeon: Irving Copas., MD;  Location: [place];  Service: Gastroenterology;;  . Lorin Mercy  01/06/09  . COLONOSCOPY  05/09/2012   Procedure: COLONOSCOPY;  Surgeon: Inda Castle, MD;  Location: [place];  Service: Endoscopy;  Laterality: N/A;  . COLONOSCOPY WITH PROPOFOL N/A 03/18/2019   Procedure: COLONOSCOPY WITH PROPOFOL;  Surgeon: Rush Landmark Telford Nab., MD;  Location: [place];  Service:  Gastroenterology;  Laterality: N/A;  . ENTEROSCOPY N/A 03/18/2019   Procedure: ENTEROSCOPY;  Surgeon: Rush Landmark Telford Nab., MD;  Location: [place];  Service: Gastroenterology;  Laterality: N/A;  . ESOPHAGOGASTRODUODENOSCOPY  05/09/2012   Procedure: ESOPHAGOGASTRODUODENOSCOPY (EGD);  Surgeon: Inda Castle, MD;  Location: [place];  Service: Endoscopy;  Laterality: N/A;  . HEMOSTASIS CLIP PLACEMENT  03/18/2019   Procedure: HEMOSTASIS CLIP PLACEMENT;  Surgeon: Irving Copas.,  MD;  Location: [place];  Service: Gastroenterology;;  . HOT HEMOSTASIS N/A 03/18/2019   Procedure: HOT HEMOSTASIS (ARGON PLASMA COAGULATION/BICAP);  Surgeon: Irving Copas., MD;  Location: [place];  Service: Gastroenterology;  Laterality: N/A;  . POLYPECTOMY  03/18/2019   Procedure: POLYPECTOMY;  Surgeon: Rush Landmark Telford Nab., MD;  Location: [place];  Service: Gastroenterology;;  . [place] INJECTION  03/18/2019   Procedure: SUBMUCOSAL TATTOO INJECTION;  Surgeon: Irving Copas., MD;  Location: [place];  Service: Gastroenterology;;     OB History   No obstetric history on file.     Family History  Problem Relation Age of Onset  . Hypertension Mother   . Colon cancer Neg Hx   . Esophageal cancer Neg Hx   . Inflammatory bowel disease Neg Hx   . Liver disease Neg Hx   . Pancreatic cancer Neg Hx   . Rectal cancer Neg Hx   . Stomach cancer Neg Hx     Social History   Tobacco Use  . Smoking status: Former Smoker    Types: Cigarettes    Quit date: 08/21/1995    Years since quitting: 24.5  . Smokeless tobacco: Never Used  Vaping Use  . Vaping Use: Never used  Substance Use Topics  . Alcohol use: No    Comment: in remission since 2007  . Drug use: Yes    Types: Marijuana    Home Medications Prior to Admission medications   Medication Sig Start Date End Date Taking? Authorizing Provider  amitriptyline (ELAVIL) 50 MG tablet TAKE 1 TABLET BY MOUTH AT BEDTIME 02/08/20   Cato Mulligan, MD  cephALEXin (KEFLEX) 500 MG capsule Take 1 capsule (500 mg total) by mouth 4 (four) times daily for 5 days. 02/14/20 02/19/20  Breck Coons, MD  diclofenac Sodium (VOLTAREN) 1 % GEL Apply 4 g topically 4 (four) times daily as needed. 02/11/20   Seawell, Jaimie A, DO  Ferrous Gluconate 256 (28 Fe) MG TABS Take 256 mg by mouth daily. Patient not taking: Reported on 08/14/2019 05/25/19   Kathi Ludwig, MD  FLOVENT [facility] 110 MCG/ACT inhaler Inhale 2  puffs by mouth twice daily 07/01/19   Bartholomew Crews, MD  fluticasone [facility]) 50 MCG/ACT nasal spray USE 2 SPRAY(S) IN EACH NOSTRIL ONCE DAILY AS NEEDED FOR ALLERGIES AND RHINITIS. 01/25/20   Virl Axe, MD  hydrochlorothiazide (HYDRODIURIL) 12.5 MG tablet Take 1 tablet (12.5 mg total) by mouth daily. 01/08/20   Oda Kilts, MD  iron polysaccharides (NIFEREX) 150 MG capsule Take 150 mg by mouth daily.    [provider]  losartan (COZAAR) 25 MG tablet Take 1 tablet by mouth once daily 10/28/19   Seawell, Jaimie A, DO  pantoprazole (PROTONIX) 40 MG tablet Take 1 tablet (40 mg total) by mouth daily. 01/12/20   Mansouraty, Telford Nab., MD  phenazopyridine (PYRIDIUM) 200 MG tablet Take 1 tablet (200 mg total) by mouth 3 (three) times daily. Patient not taking: Reported on 08/14/2019 05/22/19   Corena Herter, PA-C  Phenol-Glycerin (CHLORASEPTIC MAX SORE THROAT)  1.5-33 % LIQD Use as directed 2 sprays in the mouth or throat 4 (four) times daily as needed. 11/20/19   Seawell, Jaimie A, DO  pravastatin (PRAVACHOL) 40 MG tablet TAKE 1 TABLET BY MOUTH ONCE DAILY IN THE EVENING 12/21/19   [name], MD    Allergies    Banana, Grape (artificial) flavor, Ibuprofen, and Penicillins  Review of Systems   Review of Systems  Constitutional: Negative for chills and fever.  HENT: Negative for congestion and rhinorrhea.   Respiratory: Negative for cough and shortness of breath.   Cardiovascular: Negative for chest pain and palpitations.  Gastrointestinal: Negative for diarrhea, nausea and vomiting.  Genitourinary: Positive for dysuria. Negative for difficulty urinating and flank pain.  Musculoskeletal: Negative for arthralgias and back pain.  Skin: Negative for rash and wound.  Neurological: Negative for light-headedness and headaches.    Physical Exam Updated Vital Signs BP 108/74 (BP Location: Left Arm)   Pulse 87   Temp 98.8 F (37.1 C) (Oral)   Resp 16   LMP 08/21/1982    SpO2 100%   Physical Exam Vitals and nursing note reviewed. Exam conducted with a chaperone present.  Constitutional:      General: She is not in acute distress.    Appearance: Normal appearance.  HENT:     Head: Normocephalic and atraumatic.     Nose: No rhinorrhea.  Eyes:     General:        Right eye: No discharge.        Left eye: No discharge.     Conjunctiva/sclera: Conjunctivae normal.  Cardiovascular:     Rate and Rhythm: Normal rate and regular rhythm.  Pulmonary:     Effort: Pulmonary effort is normal. No respiratory distress.     Breath sounds: No stridor.  Abdominal:     General: Abdomen is flat. There is no distension.     Palpations: Abdomen is soft.     Tenderness: There is no abdominal tenderness. There is no right CVA tenderness, left CVA tenderness, guarding or rebound.  Musculoskeletal:        General: No tenderness or signs of injury.  Skin:    General: Skin is warm and dry.     Capillary Refill: Capillary refill takes less than 2 seconds.  Neurological:     General: No focal deficit present.     Mental Status: She is alert. Mental status is at baseline.     Motor: No weakness.  Psychiatric:        Mood and Affect: Mood normal.        Behavior: Behavior normal.     ED Results / Procedures / Treatments   Labs (all labs ordered are listed, but only abnormal results are displayed) Labs Reviewed  URINALYSIS, ROUTINE W REFLEX MICROSCOPIC - Abnormal; Notable for the following components:      Result Value   Leukocytes,Ua MODERATE (*)    All other components within normal limits  URINE CULTURE    EKG None  Radiology No results found.  Procedures Procedures (including critical care time)  Medications Ordered in ED Medications - No data to display  ED Course  I have reviewed the triage vital signs and the nursing notes.  Pertinent labs & imaging results that were available during my care of the patient were reviewed by me and considered in  my medical decision making (see chart for details).    MDM Rules/Calculators/A&P  Dysuria for 3 days.  No fevers no chills no abdominal pain benign abdominal exam.  Urinalysis with leukocyte esterase but otherwise unremarkable.  Urine culture sent Keflex prescribed.  Recommendations for skin sensitivity precautions to include simply dietary soaps detergents.  The patient agrees to this.  She will follow-up with her PCP and strict return precautions  Final Clinical Impression(s) / ED Diagnoses Final diagnoses:  Dysuria  Acute cystitis without hematuria    Rx / DC Orders ED Discharge Orders         Ordered    cephALEXin (KEFLEX) 500 MG capsule  4 times daily        02/14/20 1531           Breck Coons, MD 02/14/20 1537

## 2020-02-14 NOTE — ED Triage Notes (Addendum)
Pt to lobby by [facility] from home.  C/o body aches and painful urination  X 3-4 days.

## 2020-02-15 LAB — URINE CULTURE

## 2020-02-18 ENCOUNTER — Telehealth: Payer: Self-pay

## 2020-02-18 NOTE — Telephone Encounter (Signed)
Requesting to speak with a nurse about meds, please call pt back.  

## 2020-02-18 NOTE — Telephone Encounter (Signed)
Returned phone call, c/o constipation.  States she is on daily Fe, vitamin C.  Instructed patient to increase fluid intake and encouraged use of daily stool softener.  Pt verbalized understanding, states she had a BM recently, but feels like she is straining with BM's.  Informed patient if not resolved to call back. SChaplin, RN,BSN

## 2020-02-19 ENCOUNTER — Ambulatory Visit: Payer: Medicare Other | Admitting: Gastroenterology

## 2020-02-19 ENCOUNTER — Emergency Department (HOSPITAL_COMMUNITY)
Admission: EM | Admit: 2020-02-19 | Discharge: 2020-02-20 | Disposition: A | Discharge status: Home / Self Care | Payer: Medicare Other | Attending: Emergency Medicine | Admitting: Emergency Medicine

## 2020-02-19 ENCOUNTER — Encounter (HOSPITAL_COMMUNITY): Payer: Self-pay | Admitting: Emergency Medicine

## 2020-02-19 ENCOUNTER — Emergency Department (HOSPITAL_COMMUNITY): Payer: Medicare Other

## 2020-02-19 DIAGNOSIS — R0789 Other chest pain: Secondary | ICD-10-CM | POA: Diagnosis not present

## 2020-02-19 DIAGNOSIS — R079 Chest pain, unspecified: Secondary | ICD-10-CM | POA: Diagnosis not present

## 2020-02-19 DIAGNOSIS — Z743 Need for continuous supervision: Secondary | ICD-10-CM | POA: Diagnosis not present

## 2020-02-19 DIAGNOSIS — Z79899 Other long term (current) drug therapy: Secondary | ICD-10-CM | POA: Insufficient documentation

## 2020-02-19 DIAGNOSIS — Z87891 Personal history of nicotine dependence: Secondary | ICD-10-CM | POA: Insufficient documentation

## 2020-02-19 DIAGNOSIS — J45909 Unspecified asthma, uncomplicated: Secondary | ICD-10-CM | POA: Diagnosis not present

## 2020-02-19 DIAGNOSIS — R002 Palpitations: Secondary | ICD-10-CM | POA: Insufficient documentation

## 2020-02-19 DIAGNOSIS — Z8616 Personal history of COVID-19: Secondary | ICD-10-CM | POA: Diagnosis not present

## 2020-02-19 DIAGNOSIS — I10 Essential (primary) hypertension: Secondary | ICD-10-CM | POA: Insufficient documentation

## 2020-02-19 DIAGNOSIS — M62838 Other muscle spasm: Secondary | ICD-10-CM | POA: Insufficient documentation

## 2020-02-19 LAB — CBC
HCT: 42.9 % (ref 36.0–46.0)
Hemoglobin: 13.4 g/dL (ref 12.0–15.0)
MCH: 30.5 pg (ref 26.0–34.0)
MCHC: 31.2 g/dL (ref 30.0–36.0)
MCV: 97.7 fL (ref 80.0–100.0)
Platelets: 317 10*3/uL (ref 150–400)
RBC: 4.39 MIL/uL (ref 3.87–5.11)
RDW: 11.8 % (ref 11.5–15.5)
WBC: 5.8 10*3/uL (ref 4.0–10.5)
nRBC: 0 % (ref 0.0–0.2)

## 2020-02-19 LAB — BASIC METABOLIC PANEL
Anion gap: 10 (ref 5–15)
BUN: 7 mg/dL — ABNORMAL LOW (ref 8–23)
CO2: 28 mmol/L (ref 22–32)
Calcium: 9.6 mg/dL (ref 8.9–10.3)
Chloride: 99 mmol/L (ref 98–111)
Creatinine, Ser: 0.67 mg/dL (ref 0.44–1.00)
GFR calc Af Amer: >60 mL/min (ref >60)
GFR calc non Af Amer: >60 mL/min (ref >60)
Glucose, Bld: 96 mg/dL (ref 70–99)
Potassium: 4 mmol/L (ref 3.5–5.1)
Sodium: 137 mmol/L (ref 135–145)

## 2020-02-19 LAB — TROPONIN I (HIGH SENSITIVITY)
Troponin I (High Sensitivity): 3 ng/L (ref <18)
Troponin I (High Sensitivity): 4 ng/L (ref <18)

## 2020-02-19 NOTE — Telephone Encounter (Signed)
If she called back something like senokot or miralax may be better than stool softener alone. We can also discuss switching to Nu Iron as that sometimes has less GI side effects, although sometimes not covered by insurance. Thanks.

## 2020-02-19 NOTE — ED Triage Notes (Addendum)
Pt arrives via [facility] from home with c/o [place] left sided chest pain. Ambulatory on scene, 12 lead unremarkable 135/82, hr 80, rr16, 100% on room air cbg 106. No meds given en route. A/ox4. Pt does report carrying a 5 gallon bucket of dirt a few days ago prior to the pain starting.

## 2020-02-20 MED ORDER — CYCLOBENZAPRINE HCL 10 MG PO TABS: 10.0000 mg | ORAL_TABLET | Freq: Three times a day (TID) | ORAL | 0 refills | Status: DC | PRN

## 2020-02-20 NOTE — ED Provider Notes (Signed)
[facility] EMERGENCY DEPARTMENT Provider Note   CSN: [id number] Arrival date & time: 02/19/20  1323     History Chief Complaint  Patient presents with  . Chest Pain    Kimberly [name] is a 64 y.o. female.  Patient presents to the emergency department with a chief complaint of left chest wall pain.  She states that she strained her chest wall muscles while lifting a 5 gallon bucket of dirt a few days ago.  She states that she was doing some gardening.  She states that she felt the muscle stretch, and has had pain ever since.  She has tried using heat with some relief.  She also tried using some Biofreeze.  She states that the pain is worsened with palpation and movement.  She denies any new shortness of breath.  Denies any fever, chills, cough.  Denies any other associated symptoms.  She is here requesting a prescription for a muscle relaxer.  The history is provided by the patient. No language interpreter was used.       Past Medical History:  Diagnosis Date  . Alcohol abuse, in remission    since around 2005  . Alcoholic hepatitis    fatty liver on imaging- Treated  . Allergic rhinitis   . Anemia    EGD with duodenal AVM, normal colonoscopy 04/2012  . Asthma   . Callus of foot 2009  . Chest pain, musculoskeletal 2011  . Cholelithiasis    [place] 09/2008: Cholelithiasis is present, s/p cholecystectomy  . Depression   . Excessive cerumen in ear canal    since before 2000  . GERD (gastroesophageal reflux disease)   . History of blood transfusion   . Hyperlipidemia   . Hypertension   . Menopausal syndrome    Treated with estradiol in the past - discontinued 04/2012  . MENOPAUSAL SYNDROME 09/24/2006   2008 Note : The pt is adament about having this medication.  She fully understands the increased risk of heart disease and cancer that is associated with HRT and is willing to accept this risk in order to prevent the debilitating hot flashes she has off this  medication.  Will continue to encourage her to try to wean herself off of this medication at our next visit.  2009 note indicated that she tr  . Otitis externa, acute Feb 2012   bilateral, treated with azithromycin after failure of neomycin drops  . Otitis media, acute Feb 2012  . PONV (postoperative nausea and vomiting)     Patient Active Problem List   Diagnosis Date Noted  . Muscle strain of right upper back 02/12/2020  . COVID-19 09/25/2019  . History of dysuria 09/24/2019  . AKI (acute kidney injury) ([place]) 09/24/2019  . Myalgia 09/07/2019  . Rhinosinusitis 08/27/2019  . Acute right-sided low back pain without sciatica 08/04/2019  . Dysuria 05/25/2019  . Tachycardia 05/25/2019  . Hematuria 05/25/2019  . Ear pain, bilateral 04/27/2019  . 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 Vaginal discharge 01/18/2019  . Abdominal gas pain 12/16/2018  . Pain in right thigh 01/15/2018  . Health care maintenance 03/25/2013  . Depression 12/03/2012  . Iron deficiency anemia 05/08/2012  . HLD (hyperlipidemia) 03/25/2008  . Constipation 11/04/2007  . Essential hypertension 09/24/2006  . Allergies 09/24/2006  . Asthma 09/24/2006  . GERD 09/24/2006    Past Surgical History:  Procedure Laterality Date  . ABDOMINAL HYSTERECTOMY  1980s  . BIOPSY  03/18/2019  Procedure: BIOPSY;  Surgeon: Irving Copas., MD;  Location: [facility];  Service: Gastroenterology;;  . Lorin Mercy  01/06/09  . COLONOSCOPY  05/09/2012   Procedure: COLONOSCOPY;  Surgeon: Inda Castle, MD;  Location: [facility];  Service: Endoscopy;  Laterality: N/A;  . COLONOSCOPY WITH PROPOFOL N/A 03/18/2019   Procedure: COLONOSCOPY WITH PROPOFOL;  Surgeon: Rush Landmark Telford Nab., MD;  Location: [facility];  Service: Gastroenterology;  Laterality: N/A;  . ENTEROSCOPY N/A 03/18/2019   Procedure: ENTEROSCOPY;  Surgeon: Rush Landmark Telford Nab., MD;  Location: [facility];  Service:  Gastroenterology;  Laterality: N/A;  . ESOPHAGOGASTRODUODENOSCOPY  05/09/2012   Procedure: ESOPHAGOGASTRODUODENOSCOPY (EGD);  Surgeon: Inda Castle, MD;  Location: [facility];  Service: Endoscopy;  Laterality: N/A;  . HEMOSTASIS CLIP PLACEMENT  03/18/2019   Procedure: HEMOSTASIS CLIP PLACEMENT;  Surgeon: Irving Copas., MD;  Location: [facility];  Service: Gastroenterology;;  . HOT HEMOSTASIS N/A 03/18/2019   Procedure: HOT HEMOSTASIS (ARGON PLASMA COAGULATION/BICAP);  Surgeon: Irving Copas., MD;  Location: [facility];  Service: Gastroenterology;  Laterality: N/A;  . POLYPECTOMY  03/18/2019   Procedure: POLYPECTOMY;  Surgeon: Rush Landmark Telford Nab., MD;  Location: [facility];  Service: Gastroenterology;;  . [place] INJECTION  03/18/2019   Procedure: SUBMUCOSAL TATTOO INJECTION;  Surgeon: Irving Copas., MD;  Location: [facility];  Service: Gastroenterology;;     OB History   No obstetric history on file.     Family History  Problem Relation Age of Onset  . Hypertension Mother   . Colon cancer Neg Hx   . Esophageal cancer Neg Hx   . Inflammatory bowel disease Neg Hx   . Liver disease Neg Hx   . Pancreatic cancer Neg Hx   . Rectal cancer Neg Hx   . Stomach cancer Neg Hx     Social History   Tobacco Use  . Smoking status: Former Smoker    Types: Cigarettes    Quit date: 08/21/1995    Years since quitting: 24.5  . Smokeless tobacco: Never Used  Vaping Use  . Vaping Use: Never used  Substance Use Topics  . Alcohol use: No    Comment: in remission since 2007  . Drug use: Yes    Types: Marijuana    Home Medications Prior to Admission medications   Medication Sig Start Date End Date Taking? Authorizing Provider  amitriptyline (ELAVIL) 50 MG tablet TAKE 1 TABLET BY MOUTH AT BEDTIME 02/08/20   Cato Mulligan, MD  diclofenac Sodium (VOLTAREN) 1 % GEL Apply 4 g topically 4 (four) times daily as needed. 02/11/20   Seawell,  Jaimie A, DO  Ferrous Gluconate 256 (28 Fe) MG TABS Take 256 mg by mouth daily. Patient not taking: Reported on 08/14/2019 05/25/19   Kathi Ludwig, MD  FLOVENT [facility] 110 MCG/ACT inhaler Inhale 2 puffs by mouth twice daily 07/01/19   Bartholomew Crews, MD  fluticasone [facility]) 50 MCG/ACT nasal spray USE 2 SPRAY(S) IN EACH NOSTRIL ONCE DAILY AS NEEDED FOR ALLERGIES AND RHINITIS. 01/25/20   Virl Axe, MD  hydrochlorothiazide (HYDRODIURIL) 12.5 MG tablet Take 1 tablet (12.5 mg total) by mouth daily. 01/08/20   Oda Kilts, MD  iron polysaccharides (NIFEREX) 150 MG capsule Take 150 mg by mouth daily.    [provider]  losartan (COZAAR) 25 MG tablet Take 1 tablet by mouth once daily 10/28/19   Seawell, Jaimie A, DO  pantoprazole (PROTONIX) 40 MG tablet Take 1 tablet (40 mg total) by mouth daily. 01/12/20  Mansouraty, Telford Nab., MD  phenazopyridine (PYRIDIUM) 200 MG tablet Take 1 tablet (200 mg total) by mouth 3 (three) times daily. Patient not taking: Reported on 08/14/2019 05/22/19   Corena Herter, PA-C  Phenol-Glycerin (CHLORASEPTIC MAX SORE THROAT) 1.5-33 % LIQD Use as directed 2 sprays in the mouth or throat 4 (four) times daily as needed. 11/20/19   Seawell, Jaimie A, DO  pravastatin (PRAVACHOL) 40 MG tablet TAKE 1 TABLET BY MOUTH ONCE DAILY IN THE EVENING 12/21/19   [name], MD    Allergies    Banana, Grape (artificial) flavor, Ibuprofen, and Penicillins  Review of Systems   Review of Systems  All other systems reviewed and are negative.   Physical Exam Updated Vital Signs BP 113/86 (BP Location: Right Arm)   Pulse 88   Temp 98.2 F (36.8 C) (Oral)   Resp 18   LMP 08/21/1982   SpO2 100%   Physical Exam Vitals and nursing note reviewed.  Constitutional:      General: She is not in acute distress.    Appearance: She is well-developed.  HENT:     Head: Normocephalic and atraumatic.  Eyes:     Conjunctiva/sclera: Conjunctivae normal.    Cardiovascular:     Rate and Rhythm: Normal rate and regular rhythm.     Heart sounds: No murmur heard.      Comments: Left anterior chest wall/upper pectoralis tender to palpation Pulmonary:     Effort: Pulmonary effort is normal. No respiratory distress.     Breath sounds: Normal breath sounds.     Comments: Lung sounds are clear Abdominal:     Palpations: Abdomen is soft.     Tenderness: There is no abdominal tenderness.  Musculoskeletal:     Cervical back: Neck supple.  Skin:    General: Skin is warm and dry.  Neurological:     Mental Status: She is alert.     ED Results / Procedures / Treatments   Labs (all labs ordered are listed, but only abnormal results are displayed) Labs Reviewed  BASIC METABOLIC PANEL - Abnormal; Notable for the following components:      Result Value   BUN 7 (*)    All other components within normal limits  CBC  TROPONIN I (HIGH SENSITIVITY)  TROPONIN I (HIGH SENSITIVITY)    EKG None  Radiology DG Chest 2 View  Result Date: 02/19/2020 CLINICAL DATA:  Chest pain EXAM: CHEST - 2 VIEW COMPARISON:  August 12, 2019 and May 08, 2012 FINDINGS: There is scarring in the left apex. There is a 7 mm nodular opacity in the right upper lobe toward the apex which was present on the previous study from March 2021 but was not appreciable on the 2013 study. Lungs elsewhere are clear. The heart size and pulmonary vascularity are normal. No adenopathy. No bone lesions. IMPRESSION: 7 mm nodular opacity right upper lobe toward the apex, stable from March 2021 but not present on 2013 study. This circumstance may warrant noncontrast enhanced chest CT to further evaluate this area on the right. Scarring left apex noted. Lungs elsewhere clear. Cardiac silhouette normal. No adenopathy evident. Electronically Signed   By: Lowella Grip III M.D.   On: 02/19/2020 14:05    Procedures Procedures (including critical care time)  Medications Ordered in  ED Medications - No data to display  ED Course  I have reviewed the triage vital signs and the nursing notes.  Pertinent labs & imaging results that were available during  my care of the patient were reviewed by me and considered in my medical decision making (see chart for details).    MDM Rules/Calculators/A&P                          Patient here with left anterior chest wall pain.  It is easily reproducible with palpation.  She states that she strained her muscles while lifting a 5 gallon bucket of dirt a few days ago.  The symptoms she is describing in the injury are consistent.  We will treat her with muscle relaxer.  Recommend follow-up with PCP.  Chest x-ray shows a 7 mm nodular opacity in the right upper lobe that is stable from March 2021, I have given her written instructions to follow-up regarding this.   Final Clinical Impression(s) / ED Diagnoses Final diagnoses:  Chest wall pain  Muscle spasm    Rx / DC Orders ED Discharge Orders         Ordered    cyclobenzaprine (FLEXERIL) 10 MG tablet  3 times daily PRN        02/20/20 0147           Montine Circle, PA-C 02/20/20 0148    Fatima Blank, MD 02/20/20 [phone]

## 2020-02-20 NOTE — Discharge Instructions (Signed)
There was no significant abnormality in your blood work today.  Your chest x-ray showed a small nodular opacity that was present in March 2021, but not present in 2013.  You should discuss this finding with your doctor.  You may need to have a nonemergent CT scan of your chest at a future point in time.

## 2020-02-22 ENCOUNTER — Encounter: Payer: Medicare Other | Admitting: Internal Medicine

## 2020-02-25 ENCOUNTER — Other Ambulatory Visit: Payer: Self-pay | Admitting: Internal Medicine

## 2020-02-25 DIAGNOSIS — E785 Hyperlipidemia, unspecified: Secondary | ICD-10-CM

## 2020-02-25 NOTE — Telephone Encounter (Signed)
Requesting to speak with a nurse, please call pt back.  

## 2020-02-27 ENCOUNTER — Emergency Department (HOSPITAL_COMMUNITY): Payer: Medicare Other

## 2020-02-27 ENCOUNTER — Other Ambulatory Visit: Payer: Self-pay

## 2020-02-27 ENCOUNTER — Encounter (HOSPITAL_COMMUNITY): Payer: Self-pay | Admitting: Emergency Medicine

## 2020-02-27 ENCOUNTER — Emergency Department (HOSPITAL_COMMUNITY)
Admission: EM | Admit: 2020-02-27 | Discharge: 2020-02-27 | Disposition: A | Discharge status: Home / Self Care | Payer: Medicare Other | Attending: Emergency Medicine | Admitting: Emergency Medicine

## 2020-02-27 DIAGNOSIS — K219 Gastro-esophageal reflux disease without esophagitis: Secondary | ICD-10-CM | POA: Diagnosis not present

## 2020-02-27 DIAGNOSIS — Z8616 Personal history of COVID-19: Secondary | ICD-10-CM | POA: Insufficient documentation

## 2020-02-27 DIAGNOSIS — Z87891 Personal history of nicotine dependence: Secondary | ICD-10-CM | POA: Diagnosis not present

## 2020-02-27 DIAGNOSIS — I499 Cardiac arrhythmia, unspecified: Secondary | ICD-10-CM | POA: Diagnosis not present

## 2020-02-27 DIAGNOSIS — I10 Essential (primary) hypertension: Secondary | ICD-10-CM | POA: Diagnosis not present

## 2020-02-27 DIAGNOSIS — R0789 Other chest pain: Secondary | ICD-10-CM | POA: Diagnosis not present

## 2020-02-27 DIAGNOSIS — R079 Chest pain, unspecified: Secondary | ICD-10-CM | POA: Diagnosis not present

## 2020-02-27 DIAGNOSIS — R918 Other nonspecific abnormal finding of lung field: Secondary | ICD-10-CM | POA: Insufficient documentation

## 2020-02-27 DIAGNOSIS — R9431 Abnormal electrocardiogram [ECG] [EKG]: Secondary | ICD-10-CM | POA: Diagnosis not present

## 2020-02-27 DIAGNOSIS — J45909 Unspecified asthma, uncomplicated: Secondary | ICD-10-CM | POA: Insufficient documentation

## 2020-02-27 DIAGNOSIS — E876 Hypokalemia: Secondary | ICD-10-CM | POA: Insufficient documentation

## 2020-02-27 DIAGNOSIS — R404 Transient alteration of awareness: Secondary | ICD-10-CM | POA: Diagnosis not present

## 2020-02-27 DIAGNOSIS — Z743 Need for continuous supervision: Secondary | ICD-10-CM | POA: Diagnosis not present

## 2020-02-27 LAB — CBC
HCT: 37.3 % (ref 36.0–46.0)
Hemoglobin: 12 g/dL (ref 12.0–15.0)
MCH: 31 pg (ref 26.0–34.0)
MCHC: 32.2 g/dL (ref 30.0–36.0)
MCV: 96.4 fL (ref 80.0–100.0)
Platelets: 298 10*3/uL (ref 150–400)
RBC: 3.87 MIL/uL (ref 3.87–5.11)
RDW: 11.8 % (ref 11.5–15.5)
WBC: 5 10*3/uL (ref 4.0–10.5)
nRBC: 0 % (ref 0.0–0.2)

## 2020-02-27 LAB — BASIC METABOLIC PANEL
Anion gap: 10 (ref 5–15)
BUN: 9 mg/dL (ref 8–23)
CO2: 27 mmol/L (ref 22–32)
Calcium: 8.9 mg/dL (ref 8.9–10.3)
Chloride: 101 mmol/L (ref 98–111)
Creatinine, Ser: 0.86 mg/dL (ref 0.44–1.00)
GFR calc Af Amer: >60 mL/min (ref >60)
GFR calc non Af Amer: >60 mL/min (ref >60)
Glucose, Bld: 107 mg/dL — ABNORMAL HIGH (ref 70–99)
Potassium: 2.9 mmol/L — ABNORMAL LOW (ref 3.5–5.1)
Sodium: 138 mmol/L (ref 135–145)

## 2020-02-27 LAB — TROPONIN I (HIGH SENSITIVITY)
Troponin I (High Sensitivity): 4 ng/L (ref <18)
Troponin I (High Sensitivity): 4 ng/L (ref <18)

## 2020-02-27 MED ORDER — ALUM & MAG HYDROXIDE-SIMETH 200-200-20 MG/5ML PO SUSP
30.0000 mL | Freq: Once | ORAL | Status: AC
  Administered 2020-02-27: 30 mL via ORAL
  Filled 2020-02-27: qty 30

## 2020-02-27 MED ORDER — PANTOPRAZOLE SODIUM 40 MG PO TBEC
40.0000 mg | DELAYED_RELEASE_TABLET | Freq: Once | ORAL | Status: AC
  Administered 2020-02-27: 40 mg via ORAL
  Filled 2020-02-27: qty 1

## 2020-02-27 MED ORDER — IOHEXOL 300 MG/ML  SOLN
75.0000 mL | Freq: Once | INTRAMUSCULAR | Status: AC | PRN
  Administered 2020-02-27: 75 mL via INTRAVENOUS

## 2020-02-27 MED ORDER — POTASSIUM CHLORIDE CRYS ER 20 MEQ PO TBCR
40.0000 meq | EXTENDED_RELEASE_TABLET | Freq: Once | ORAL | Status: AC
  Administered 2020-02-27: 40 meq via ORAL
  Filled 2020-02-27: qty 2

## 2020-02-27 MED ORDER — LIDOCAINE VISCOUS HCL 2 % MT SOLN
15.0000 mL | Freq: Once | OROMUCOSAL | Status: AC
  Administered 2020-02-27: 15 mL via ORAL
  Filled 2020-02-27: qty 15

## 2020-02-27 MED ORDER — SUCRALFATE 1 GM/10ML PO SUSP: 1.0000 g | Freq: Three times a day (TID) | ORAL | 0 refills | Status: DC

## 2020-02-27 MED ORDER — POTASSIUM CHLORIDE ER 10 MEQ PO TBCR: 10.0000 meq | EXTENDED_RELEASE_TABLET | Freq: Every day | ORAL | 0 refills | Status: DC

## 2020-02-27 MED ORDER — POTASSIUM CHLORIDE 10 MEQ/100ML IV SOLN
10.0000 meq | Freq: Once | INTRAVENOUS | Status: AC
  Administered 2020-02-27: 10 meq via INTRAVENOUS
  Filled 2020-02-27: qty 100

## 2020-02-27 NOTE — ED Provider Notes (Signed)
Patient had chest x-ray while in waiting room.  Radiology called me recommending a CT scan of the chest to further evaluate bilateral apical nodule areas to ensure that these are not emergent, this order has been placed.  Vital signs are stable.  Patient awaiting bed.   Lennice Sites, DO 02/27/20 1611

## 2020-02-27 NOTE — Discharge Instructions (Signed)
STOP taking HCTZ. This may be causing your low potassium. Talk to your doctor before stating this medication again. You are also being prescribed a potassium supplement. Take one pill per day for the next week.  You had two spots on your lungs that may be cancer. You will need more tests to find out if they are cancer or not, and what your treatment options are. Please see your doctor as soon as possible to discuss this.  Continue to take your protonix. You may also drink carafate up to four times per day for additional pain.

## 2020-02-27 NOTE — ED Provider Notes (Signed)
[place] EMERGENCY DEPARTMENT Provider Note   CSN: [id number] Arrival date & time: 02/27/20  1517     History Chief Complaint  Patient presents with  . Chest Pain    Kimberly [name] is a 64 y.o. female history of GERD, depression, hypertension, hyperlipidemia who presents with chest pain.  Patient states that she ate eggs, [place] sausage, and macaroni and cheese for breakfast this morning and afterwards had onset of a burning chest pain that goes up into her throat.  She says it is identical to her typical GERD symptoms.  Denies shortness of breath, nausea, vomiting, fevers, abdominal pain.  She takes omeprazole 40 mg at night, and states she came in to the emergency department because her pain is severe and it was not time for medication.   Denies taking any new medications recently.   Chest Pain Pain location:  Epigastric Pain quality: burning   Pain radiates to:  Does not radiate Pain severity:  Moderate Onset quality:  Sudden Timing:  Constant Progression:  Improving Chronicity:  Recurrent Context: eating   Relieved by:  Nothing Ineffective treatments:  Antacids Associated symptoms: heartburn   Associated symptoms: no abdominal pain, no back pain, no cough, no dysphagia, no fever, no nausea, no palpitations, no shortness of breath and no vomiting        Past Medical History:  Diagnosis Date  . Alcohol abuse, in remission    since around 2005  . Alcoholic hepatitis    fatty liver on imaging- Treated  . Allergic rhinitis   . Anemia    EGD with duodenal AVM, normal colonoscopy 04/2012  . Asthma   . Callus of foot 2009  . Chest pain, musculoskeletal 2011  . Cholelithiasis    [place] 09/2008: Cholelithiasis is present, s/p cholecystectomy  . Depression   . Excessive cerumen in ear canal    since before 2000  . GERD (gastroesophageal reflux disease)   . History of blood transfusion   . Hyperlipidemia   . Hypertension   . Menopausal syndrome     Treated with estradiol in the past - discontinued 04/2012  . MENOPAUSAL SYNDROME 09/24/2006   2008 Note : The pt is adament about having this medication.  She fully understands the increased risk of heart disease and cancer that is associated with HRT and is willing to accept this risk in order to prevent the debilitating hot flashes she has off this medication.  Will continue to encourage her to try to wean herself off of this medication at our next visit.  2009 note indicated that she tr  . Otitis externa, acute Feb 2012   bilateral, treated with azithromycin after failure of neomycin drops  . Otitis media, acute Feb 2012  . PONV (postoperative nausea and vomiting)     Patient Active Problem List   Diagnosis Date Noted  . Muscle strain of right upper back 02/12/2020  . COVID-19 09/25/2019  . History of dysuria 09/24/2019  . AKI (acute kidney injury) ([place]) 09/24/2019  . Myalgia 09/07/2019  . Rhinosinusitis 08/27/2019  . Acute right-sided low back pain without sciatica 08/04/2019  . Dysuria 05/25/2019  . Tachycardia 05/25/2019  . Hematuria 05/25/2019  . Ear pain, bilateral 04/27/2019  . 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 Vaginal discharge 01/18/2019  . Abdominal gas pain 12/16/2018  . Pain in right thigh 01/15/2018  . Health care maintenance 03/25/2013  . Depression 12/03/2012  . Iron deficiency anemia 05/08/2012  .  HLD (hyperlipidemia) 03/25/2008  . Constipation 11/04/2007  . Essential hypertension 09/24/2006  . Allergies 09/24/2006  . Asthma 09/24/2006  . GERD 09/24/2006    Past Surgical History:  Procedure Laterality Date  . ABDOMINAL HYSTERECTOMY  1980s  . BIOPSY  03/18/2019   Procedure: BIOPSY;  Surgeon: Irving Copas., MD;  Location: [place];  Service: Gastroenterology;;  . Lorin Mercy  01/06/09  . COLONOSCOPY  05/09/2012   Procedure: COLONOSCOPY;  Surgeon: Inda Castle, MD;  Location: [place];  Service:  Endoscopy;  Laterality: N/A;  . COLONOSCOPY WITH PROPOFOL N/A 03/18/2019   Procedure: COLONOSCOPY WITH PROPOFOL;  Surgeon: Rush Landmark Telford Nab., MD;  Location: [place];  Service: Gastroenterology;  Laterality: N/A;  . ENTEROSCOPY N/A 03/18/2019   Procedure: ENTEROSCOPY;  Surgeon: Rush Landmark Telford Nab., MD;  Location: [place];  Service: Gastroenterology;  Laterality: N/A;  . ESOPHAGOGASTRODUODENOSCOPY  05/09/2012   Procedure: ESOPHAGOGASTRODUODENOSCOPY (EGD);  Surgeon: Inda Castle, MD;  Location: [place];  Service: Endoscopy;  Laterality: N/A;  . HEMOSTASIS CLIP PLACEMENT  03/18/2019   Procedure: HEMOSTASIS CLIP PLACEMENT;  Surgeon: Irving Copas., MD;  Location: [place];  Service: Gastroenterology;;  . HOT HEMOSTASIS N/A 03/18/2019   Procedure: HOT HEMOSTASIS (ARGON PLASMA COAGULATION/BICAP);  Surgeon: Irving Copas., MD;  Location: [place];  Service: Gastroenterology;  Laterality: N/A;  . POLYPECTOMY  03/18/2019   Procedure: POLYPECTOMY;  Surgeon: Rush Landmark Telford Nab., MD;  Location: [place];  Service: Gastroenterology;;  . [place] INJECTION  03/18/2019   Procedure: SUBMUCOSAL TATTOO INJECTION;  Surgeon: Irving Copas., MD;  Location: [place];  Service: Gastroenterology;;     OB History   No obstetric history on file.     Family History  Problem Relation Age of Onset  . Hypertension Mother   . Colon cancer Neg Hx   . Esophageal cancer Neg Hx   . Inflammatory bowel disease Neg Hx   . Liver disease Neg Hx   . Pancreatic cancer Neg Hx   . Rectal cancer Neg Hx   . Stomach cancer Neg Hx     Social History   Tobacco Use  . Smoking status: Former Smoker    Types: Cigarettes    Quit date: 08/21/1995    Years since quitting: 24.5  . Smokeless tobacco: Never Used  Vaping Use  . Vaping Use: Never used  Substance Use Topics  . Alcohol use: No    Comment: in remission since 2007  . Drug use: Yes     Types: Marijuana    Home Medications Prior to Admission medications   Medication Sig Start Date End Date Taking? Authorizing Provider  amitriptyline (ELAVIL) 50 MG tablet TAKE 1 TABLET BY MOUTH AT BEDTIME 02/08/20   Cato Mulligan, MD  cyclobenzaprine (FLEXERIL) 10 MG tablet Take 1 tablet (10 mg total) by mouth 3 (three) times daily as needed for muscle spasms. 02/20/20   Montine Circle, PA-C  diclofenac Sodium (VOLTAREN) 1 % GEL Apply 4 g topically 4 (four) times daily as needed. 02/11/20   Seawell, Jaimie A, DO  Ferrous Gluconate 256 (28 Fe) MG TABS Take 256 mg by mouth daily. Patient not taking: Reported on 08/14/2019 05/25/19   Kathi Ludwig, MD  FLOVENT [facility] 110 MCG/ACT inhaler Inhale 2 puffs by mouth twice daily 07/01/19   Bartholomew Crews, MD  fluticasone [facility]) 50 MCG/ACT nasal spray USE 2 SPRAY(S) IN EACH NOSTRIL ONCE DAILY AS NEEDED FOR ALLERGIES AND RHINITIS. 01/25/20   Virl Axe, MD  hydrochlorothiazide (  HYDRODIURIL) 12.5 MG tablet Take 1 tablet (12.5 mg total) by mouth daily. 01/08/20   Oda Kilts, MD  iron polysaccharides (NIFEREX) 150 MG capsule Take 150 mg by mouth daily.    [provider]  losartan (COZAAR) 25 MG tablet Take 1 tablet by mouth once daily 10/28/19   Seawell, Jaimie A, DO  pantoprazole (PROTONIX) 40 MG tablet Take 1 tablet (40 mg total) by mouth daily. 01/12/20   Mansouraty, Telford Nab., MD  phenazopyridine (PYRIDIUM) 200 MG tablet Take 1 tablet (200 mg total) by mouth 3 (three) times daily. Patient not taking: Reported on 08/14/2019 05/22/19   Corena Herter, PA-C  Phenol-Glycerin (CHLORASEPTIC MAX SORE THROAT) 1.5-33 % LIQD Use as directed 2 sprays in the mouth or throat 4 (four) times daily as needed. 11/20/19   Seawell, Jaimie A, DO  potassium chloride (KLOR-CON) 10 MEQ tablet Take 1 tablet (10 mEq total) by mouth daily. 02/27/20   [name], MD  pravastatin (PRAVACHOL) 40 MG tablet TAKE 1 TABLET BY MOUTH ONCE DAILY IN THE  EVENING 02/26/20   [name], MD  sucralfate (CARAFATE) 1 GM/10ML suspension Take 10 mLs (1 g total) by mouth 4 (four) times daily -  with meals and at bedtime. 02/27/20   [name], MD    Allergies    Banana, Grape (artificial) flavor, Ibuprofen, and Penicillins  Review of Systems   Review of Systems  Constitutional: Negative for chills and fever.  HENT: Negative for ear pain, sore throat and trouble swallowing.   Eyes: Negative for pain and visual disturbance.  Respiratory: Negative for cough and shortness of breath.   Cardiovascular: Positive for chest pain. Negative for palpitations.  Gastrointestinal: Positive for heartburn. Negative for abdominal pain, nausea and vomiting.  Genitourinary: Negative for dysuria and hematuria.  Musculoskeletal: Negative for arthralgias and back pain.  Skin: Negative for color change and rash.  Neurological: Negative for seizures and syncope.  All other systems reviewed and are negative.   Physical Exam Updated Vital Signs BP 137/89   Pulse 80   Temp 98.9 F (37.2 C) (Oral)   Resp 15   LMP 08/21/1982   SpO2 100%   Physical Exam Vitals and nursing note reviewed.  Constitutional:      General: She is not in acute distress.    Appearance: She is well-developed.  HENT:     Head: Normocephalic and atraumatic.  Eyes:     Conjunctiva/sclera: Conjunctivae normal.  Cardiovascular:     Rate and Rhythm: Normal rate and regular rhythm.     Heart sounds: No murmur heard.   Pulmonary:     Effort: Pulmonary effort is normal. No respiratory distress.     Breath sounds: Normal breath sounds.  Abdominal:     Palpations: Abdomen is soft.     Tenderness: There is no abdominal tenderness.  Musculoskeletal:     Cervical back: Neck supple.  Skin:    General: Skin is warm and dry.  Neurological:     General: No focal deficit present.     Mental Status: She is alert and oriented to person, place, and time.     ED Results / Procedures /  Treatments   Labs (all labs ordered are listed, but only abnormal results are displayed) Labs Reviewed  BASIC METABOLIC PANEL - Abnormal; Notable for the following components:      Result Value   Potassium 2.9 (*)    Glucose, Bld 107 (*)    All other components within  normal limits  CBC  TROPONIN I (HIGH SENSITIVITY)  TROPONIN I (HIGH SENSITIVITY)    EKG EKG Interpretation  Date/Time:  Saturday February 27 2020 15:19:22 EDT Ventricular Rate:  95 PR Interval:  126 QRS Duration: 94 QT Interval:  356 QTC Calculation: 447 R Axis:   68 Text Interpretation: Normal sinus rhythm Nonspecific T wave abnormality No significant change since last tracing Confirmed by Blanchie Dessert ([id number]) on 02/27/2020 5:19:39 PM   Radiology DG Chest 2 View  Addendum Date: 02/27/2020   ADDENDUM REPORT: 02/27/2020 17:55 ADDENDUM: These results were called by telephone at the time of interpretation on 02/27/2020 at 4:10pm to provider Dr Leodis Sias , who verbally acknowledged these results. Electronically Signed   By: Iven Finn M.D.   On: 02/27/2020 17:55   Result Date: 02/27/2020 CLINICAL DATA:  Chest pain, burning sensation center of chest, history of GERD. Nausea. EXAM: CHEST - 2 VIEW COMPARISON:  Chest x-ray 02/19/2020, chest x-ray 08/12/2019, chest x-ray 07/14/2010 FINDINGS: Findings The heart size and mediastinal contours are unchanged. Biapical pleural/pulmonary scarring. Redemonstration of a couple of biapical, pericentimeter, spiculated-appearing, nodular-like densities (findings visualized on the prior 2021 study; however, new compared to the 20 12 chest x-ray). No focal consolidation. No pulmonary edema. No pleural effusion. No pneumothorax. No acute osseous abnormality. IMPRESSION: 1. Biapical nodular-like densities. Recommend CT chest with intravenous contrast for further evaluation. 2. Otherwise no acute cardiopulmonary abnormality. Electronically Signed: By: Iven Finn M.D. On:  02/27/2020 16:00   CT Chest W Contrast  Result Date: 02/27/2020 CLINICAL DATA:  Lung nodule, < 95mm, low cancer risk. Reports burning sensation to center of chest since this morning. Hx of GERD EXAM: CT CHEST WITH CONTRAST TECHNIQUE: Multidetector CT imaging of the chest was performed during intravenous contrast administration. CONTRAST:  5mL OMNIPAQUE IOHEXOL 300 MG/ML  SOLN COMPARISON:  Chest x-ray 02/19/2020, chest x-ray 02/27/2020. FINDINGS: Cardiovascular: Normal heart size. No significant pericardial effusion. The thoracic aorta is normal in caliber. At least mild to moderate calcified and noncalcified atherosclerotic plaque of the thoracic aorta. Mild left anterior descending coronary artery calcifications. The main pulmonary artery is normal in caliber. No central pulmonary embolus. Mediastinum/Nodes: No enlarged mediastinal, hilar, or axillary lymph nodes. Subcentimeter hypodensity within the left thyroid gland. Otherwise the thyroid gland is unremarkable. The trachea and esophagus demonstrate no significant findings. Lungs/Pleura: Moderate centrilobular emphysematous changes. Biapical pleural/pulmonary scarring. Multifocal, primarily within the lung apices, pericentimeter ground-glass, nodular-like, densities with as an example a 1.2 cm lesion (7:21) at the right apex, a 2.3 x 1.1 cm lesion at the left apex (7:24), and a 1 cm lesion within the left lower lobe (7:51). There is a 1.1 x 1.2 cm spiculated right lower lobe (7:60) nodule leading to tethering of the right major fissure (5:86). There is another spiculated nodule in the right apex measuring 1.3 x 1.1 cm (7:29). No pleural effusion. No pneumothorax. Upper Abdomen: Layering hyperdensity within the gastric fundus likely represents ingested medication for gastroesophageal reflux. Otherwise no acute abnormality. Musculoskeletal: No chest wall abnormality. No acute or significant osseous findings. IMPRESSION: 1. Two spiculated pulmonary nodules  within different lobes (right upper and right lower) that are concerning for malignancy. Consider treatment or biopsy as well as follow-up CT in 3 months. Recommend thoracic referral for multidisciplinary case conference on an non-emergent basis. 2. Scattered nonspecific infection/inflammatory ground-glass opacities primarily within the lung opacities. 3. Aortic Atherosclerosis (ICD10-I70.0) and Emphysema (ICD10-J43.9). These results were called by telephone at the time of interpretation on 02/27/2020  at 7:41pm to provider Dr. Maryan Rued , who verbally acknowledged these results. Electronically Signed   By: Iven Finn M.D.   On: 02/27/2020 19:47    Procedures Procedures (including critical care time)  Medications Ordered in ED Medications  alum & mag hydroxide-simeth (MAALOX/MYLANTA) 200-200-20 MG/5ML suspension 30 mL (30 mLs Oral Given 02/27/20 1817)    And  lidocaine (XYLOCAINE) 2 % viscous mouth solution 15 mL (15 mLs Oral Given 02/27/20 1817)  pantoprazole (PROTONIX) EC tablet 40 mg (40 mg Oral Given 02/27/20 1817)  potassium chloride 10 mEq in 100 mL IVPB (0 mEq Intravenous Stopped 02/27/20 2024)  potassium chloride SA (KLOR-CON) CR tablet 40 mEq (40 mEq Oral Given 02/27/20 1816)  iohexol (OMNIPAQUE) 300 MG/ML solution 75 mL (75 mLs Intravenous Contrast Given 02/27/20 1856)    ED Course  I have reviewed the triage vital signs and the nursing notes.  Pertinent labs & imaging results that were available during my care of the patient were reviewed by me and considered in my medical decision making (see chart for details).    MDM Rules/Calculators/A&P                          EKG normal sinus rhythm, no signs of acute ischemia.  Unchanged from prior.  Initial troponin 4, P4.  Not consistent with ACS.    Potassium 2.9, will replete and advise to stop taking HCTZ until she follows up with her primary doctor.   Biapical nodular-like density seen on chest x-ray, will obtain CT chest for  further evaluation.  Showed multiple spiculated nodules concerning for malignancy.  Discussed with patient, who agreed she will follow up with PCP for further evaluation, including biopsy.  Her chest pain symptoms are consistent with GERD.  Symptoms improved after GI cocktail.  Patient discharged with Carafate.  Patient appropriate for discharge at this time.  Return precautions given.  The patient was seen with Dr. Maryan Rued. Final Clinical Impression(s) / ED Diagnoses Final diagnoses:  Hypokalemia  Gastroesophageal reflux disease, unspecified whether esophagitis present  Lung nodule, multiple    Rx / DC Orders ED Discharge Orders         Ordered    sucralfate (CARAFATE) 1 GM/10ML suspension  3 times daily with meals & bedtime        02/27/20 2128    potassium chloride (KLOR-CON) 10 MEQ tablet  Daily        02/27/20 2128           [name], MD 02/28/20 0000    Blanchie Dessert, MD 02/29/20 2156

## 2020-02-27 NOTE — ED Triage Notes (Signed)
Pt to triage via [facility] from home.  Reports burning sensation to center of chest since this morning.  Hx of GERD.  Took Mylanta.  Reports nausea with EMS.  20g L FA and Zofran 4mg  given.  States pain improved some but still rates as 10/10.

## 2020-02-27 NOTE — ED Notes (Signed)
Pt transported to CT ?

## 2020-02-29 ENCOUNTER — Other Ambulatory Visit: Payer: Self-pay

## 2020-02-29 ENCOUNTER — Ambulatory Visit (INDEPENDENT_AMBULATORY_CARE_PROVIDER_SITE_OTHER): Payer: Medicare Other | Admitting: Internal Medicine

## 2020-02-29 ENCOUNTER — Emergency Department (HOSPITAL_COMMUNITY): Payer: Medicare Other

## 2020-02-29 ENCOUNTER — Encounter (HOSPITAL_COMMUNITY): Payer: Self-pay

## 2020-02-29 ENCOUNTER — Encounter: Payer: Self-pay | Admitting: Internal Medicine

## 2020-02-29 ENCOUNTER — Emergency Department (HOSPITAL_COMMUNITY)
Admission: EM | Admit: 2020-02-29 | Discharge: 2020-02-29 | Disposition: A | Discharge status: Home / Self Care | Payer: Medicare Other | Attending: Emergency Medicine | Admitting: Emergency Medicine

## 2020-02-29 VITALS — BP 130/76 | HR 94 | Temp 98.4°F | Ht 66.0 in | Wt 103.6 lb

## 2020-02-29 DIAGNOSIS — Z23 Encounter for immunization: Secondary | ICD-10-CM | POA: Insufficient documentation

## 2020-02-29 DIAGNOSIS — R519 Headache, unspecified: Secondary | ICD-10-CM | POA: Diagnosis not present

## 2020-02-29 DIAGNOSIS — K219 Gastro-esophageal reflux disease without esophagitis: Secondary | ICD-10-CM | POA: Diagnosis not present

## 2020-02-29 DIAGNOSIS — I10 Essential (primary) hypertension: Secondary | ICD-10-CM

## 2020-02-29 DIAGNOSIS — R911 Solitary pulmonary nodule: Secondary | ICD-10-CM | POA: Diagnosis not present

## 2020-02-29 DIAGNOSIS — Z5321 Procedure and treatment not carried out due to patient leaving prior to being seen by health care provider: Secondary | ICD-10-CM | POA: Diagnosis not present

## 2020-02-29 DIAGNOSIS — F32A Depression, unspecified: Secondary | ICD-10-CM

## 2020-02-29 DIAGNOSIS — R918 Other nonspecific abnormal finding of lung field: Secondary | ICD-10-CM | POA: Diagnosis not present

## 2020-02-29 DIAGNOSIS — R079 Chest pain, unspecified: Secondary | ICD-10-CM | POA: Diagnosis not present

## 2020-02-29 LAB — BASIC METABOLIC PANEL
Anion gap: 7 (ref 5–15)
BUN: 6 mg/dL — ABNORMAL LOW (ref 8–23)
CO2: 26 mmol/L (ref 22–32)
Calcium: 9.5 mg/dL (ref 8.9–10.3)
Chloride: 103 mmol/L (ref 98–111)
Creatinine, Ser: 0.73 mg/dL (ref 0.44–1.00)
GFR calc Af Amer: >60 mL/min (ref >60)
GFR calc non Af Amer: >60 mL/min (ref >60)
Glucose, Bld: 107 mg/dL — ABNORMAL HIGH (ref 70–99)
Potassium: 4.8 mmol/L (ref 3.5–5.1)
Sodium: 136 mmol/L (ref 135–145)

## 2020-02-29 LAB — CBC
HCT: 39.1 % (ref 36.0–46.0)
Hemoglobin: 12.4 g/dL (ref 12.0–15.0)
MCH: 31 pg (ref 26.0–34.0)
MCHC: 31.7 g/dL (ref 30.0–36.0)
MCV: 97.8 fL (ref 80.0–100.0)
Platelets: 295 10*3/uL (ref 150–400)
RBC: 4 MIL/uL (ref 3.87–5.11)
RDW: 11.7 % (ref 11.5–15.5)
WBC: 7.9 10*3/uL (ref 4.0–10.5)
nRBC: 0 % (ref 0.0–0.2)

## 2020-02-29 LAB — TROPONIN I (HIGH SENSITIVITY): Troponin I (High Sensitivity): 3 ng/L (ref <18)

## 2020-02-29 MED ORDER — AMITRIPTYLINE HCL 50 MG PO TABS: 50.0000 mg | ORAL_TABLET | Freq: Every day | ORAL | 3 refills | Status: DC

## 2020-02-29 MED ORDER — SUCRALFATE 1 GM/10ML PO SUSP: 1.0000 g | Freq: Three times a day (TID) | ORAL | 0 refills | Status: DC

## 2020-02-29 NOTE — Assessment & Plan Note (Signed)
Kimberly [name] is a 64 yo F w/ PMH of GERD, HTN, IDA presenting to [facility] for f/u visit after going to ED for chest pain. She was in her usual state of health until couple days prior when she had acute onset chest pain exacerbated by dinner. Alleviated by rest. Denies any corresponding palpitations, dyspnea. Seen by ED and was told she was not having heart attack. She mentions another episodes this morning which prompted her to go to the ED but she left prior to being seen by a physician and came to the clinic instead. She describes current pain as 'like indigestion' Mentions taking her pantoprazole every day as prescribed.  A/p Chest pain like due to GERD. Reviewed labs and EKG from earlier ED visit. No ischemic changes on EKG. Troponin negative x1. Description of pain consistent w/ GERD. - C/w pantoprazole 40mg  daily - Sucralfate suspension PRN - Reassurance provided - Reviewed lifestyle modifications to prevent GERD exacerbation (small meals, avoid caffeine, acidic foods)

## 2020-02-29 NOTE — Assessment & Plan Note (Addendum)
Presents w/ significant anxiety and concern over being told she may have cancer in the ED. Chest CT reviewed w/ Ms.Grupe. Noted to have multiple ~1cm spiculated pulmonary nodules on R upper and R lower lobes. Risk factor includes previous smoking history (~30 pack years. Quit in 90s), marijuana use. Discused w/ Ms.Laffoon regarding f/u with pulm for either repeat imaging vs biopsy. Ms.Mcchesney expressed understanding  A/P Presents for management of incidental pulmonary nodules. CT abdomen from previous imaging shows opacities more consistent w/ inflammatory/infectious process in setting of COVID pneumonia earlier this year but unable to find prior dedicated chest CT. Does have prior smoking hx but was greater than 15 years ago so not considered high risk. Likely would be okay with repeated imaging but will make referral to pulm for possible further work-up considering high risk characteristics (spiculated appearance, multiple locations)  - F/u with pulmonology

## 2020-02-29 NOTE — Assessment & Plan Note (Signed)
Agreeable to receive flu shot today

## 2020-02-29 NOTE — ED Notes (Signed)
No answer for vital check x3

## 2020-02-29 NOTE — Assessment & Plan Note (Addendum)
BP Readings from Last 3 Encounters:  02/29/20 116/81  02/29/20 130/76  02/27/20 137/89   BP at goal. Currently on losartan 25mg  daily. Was advised to stop HCTZ due to hypokalemia found on recent ED visit. Also was started on potassium supplementation. BMP from this am in ED shows resolution of hypokalemia with K at 4.8. Denies any current palpitations. - D/c potassium supplementation, hold HCTZ - C/w losartan 25mg  daily

## 2020-02-29 NOTE — ED Notes (Signed)
Pt called for vital check no answer.

## 2020-02-29 NOTE — Patient Instructions (Signed)
Thank you for allowing [place] to provide your care today. Today we discussed your chest pain    I have ordered no labs for you. I will call if any are abnormal.    Today we made the following changes to your medications.    Please take your pantoprazole every day as prescribed Consider using sulcafarate solution as needed for pain I have also made a referral for you to see pulmonology  Please follow-up in 3 months.    Should you have any questions or concerns please call the internal medicine clinic at [phone].     Food Choices for Gastroesophageal Reflux Disease, Adult When you have gastroesophageal reflux disease (GERD), the foods you eat and your eating habits are very important. Choosing the right foods can help ease your discomfort. Think about working with a nutrition specialist (dietitian) to help you make good choices. What are tips for following this plan?  Meals  Choose healthy foods that are low in fat, such as fruits, vegetables, whole grains, low-fat dairy products, and lean meat, fish, and poultry.  Eat small meals often instead of 3 large meals a day. Eat your meals slowly, and in a place where you are relaxed. Avoid bending over or lying down until 2-3 hours after eating.  Avoid eating meals 2-3 hours before bed.  Avoid drinking a lot of liquid with meals.  Cook foods using methods other than frying. Bake, grill, or broil food instead.  Avoid or limit: ? Chocolate. ? Peppermint or spearmint. ? Alcohol. ? Pepper. ? Black and decaffeinated coffee. ? Black and decaffeinated tea. ? Bubbly (carbonated) soft drinks. ? Caffeinated energy drinks and soft drinks.  Limit high-fat foods such as: ? Fatty meat or fried foods. ? Whole milk, cream, butter, or ice cream. ? Nuts and nut butters. ? Pastries, donuts, and sweets made with butter or shortening.  Avoid foods that cause symptoms. These foods may be different for everyone. Common foods that cause symptoms  include: ? Tomatoes. ? Oranges, lemons, and limes. ? Peppers. ? Spicy food. ? Onions and garlic. ? Vinegar. Lifestyle  Maintain a healthy weight. Ask your doctor what weight is healthy for you. If you need to lose weight, work with your doctor to do so safely.  Exercise for at least 30 minutes for 5 or more days each week, or as told by your doctor.  Wear loose-fitting clothes.  Do not smoke. If you need help quitting, ask your doctor.  Sleep with the head of your bed higher than your feet. Use a wedge under the mattress or blocks under the bed frame to raise the head of the bed. Summary  When you have gastroesophageal reflux disease (GERD), food and lifestyle choices are very important in easing your symptoms.  Eat small meals often instead of 3 large meals a day. Eat your meals slowly, and in a place where you are relaxed.  Limit high-fat foods such as fatty meat or fried foods.  Avoid bending over or lying down until 2-3 hours after eating.  Avoid peppermint and spearmint, caffeine, alcohol, and chocolate. This information is not intended to replace advice given to you by your health care provider. Make sure you discuss any questions you have with your health care provider. Document Revised: 09/04/2018 Document Reviewed: 06/19/2016 Elsevier Patient Education  [place].

## 2020-02-29 NOTE — ED Triage Notes (Signed)
Pt presents with chest pain and head pain starting yesterday am. Seen here yesterday for the same. Pt left without being seen and returned this morning. Pt also reports she was seen here 2 days ago for the same. States she needs to see a heart dr today

## 2020-02-29 NOTE — Progress Notes (Signed)
CC: chest pain  HPI: [name] is a 64 y.o. with PMH listed below presenting with complaint of chest pain. Please see problem based assessment and plan for further details.  Past Medical History:  Diagnosis Date  . Alcohol abuse, in remission    since around 2005  . Alcoholic hepatitis    fatty liver on imaging- Treated  . Allergic rhinitis   . Anemia    EGD with duodenal AVM, normal colonoscopy 04/2012  . Asthma   . Callus of foot 2009  . Chest pain, musculoskeletal 2011  . Cholelithiasis    [place] 09/2008: Cholelithiasis is present, s/p cholecystectomy  . Depression   . Excessive cerumen in ear canal    since before 2000  . GERD (gastroesophageal reflux disease)   . History of blood transfusion   . Hyperlipidemia   . Hypertension   . Menopausal syndrome    Treated with estradiol in the past - discontinued 04/2012  . MENOPAUSAL SYNDROME 09/24/2006   2008 Note : The pt is adament about having this medication.  She fully understands the increased risk of heart disease and cancer that is associated with HRT and is willing to accept this risk in order to prevent the debilitating hot flashes she has off this medication.  Will continue to encourage her to try to wean herself off of this medication at our next visit.  2009 note indicated that she tr  . Otitis externa, acute Feb 2012   bilateral, treated with azithromycin after failure of neomycin drops  . Otitis media, acute Feb 2012  . PONV (postoperative nausea and vomiting)    Review of Systems: Review of Systems  Constitutional: Negative for chills, fever and malaise/fatigue.  Eyes: Negative for blurred vision.  Respiratory: Negative for cough and shortness of breath.   Cardiovascular: Positive for chest pain. Negative for palpitations and leg swelling.  Gastrointestinal: Positive for heartburn. Negative for constipation, diarrhea, nausea and vomiting.  Neurological: Negative for dizziness.      Physical  Exam: Vitals:   02/29/20 1320 02/29/20 1331  BP: 136/81 130/76  Pulse: 94   Temp: 98.4 F (36.9 C)   TempSrc: Oral   SpO2: 100%   Weight: 103 lb 9.6 oz (47 kg)   Height: 5\' 6"  (1.676 m)     Gen: Well-developed, well nourished, NAD HEENT: NCAT head, hearing intact CV: RRR, S1, S2 normal, no chest wall tenderness to palpation Pulm: CTAB, No rales, no wheezes Abd: NTND, BS+, no guarding, no rebound Extm: ROM intact, Peripheral pulses intact, No peripheral edema Skin: Dry, Warm, normal turgor  Assessment & Plan:   Essential hypertension BP Readings from Last 3 Encounters:  02/29/20 116/81  02/29/20 130/76  02/27/20 137/89   BP at goal. Currently on losartan 25mg  daily. Was advised to stop HCTZ due to hypokalemia found on recent ED visit. Also was started on potassium supplementation. BMP from this am in ED shows resolution of hypokalemia with K at 4.8. Denies any current palpitations. - D/c potassium supplementation, hold HCTZ - C/w losartan 25mg  daily  Need for immunization against influenza Agreeable to receive flu shot today  GERD Kimberly [name] is a 64 yo F w/ PMH of GERD, HTN, IDA presenting to [facility] for f/u visit after going to ED for chest pain. She was in her usual state of health until couple days prior when she had acute onset chest pain exacerbated by dinner. Alleviated by rest. Denies any corresponding palpitations, dyspnea. Seen by ED and  was told she was not having heart attack. She mentions another episodes this morning which prompted her to go to the ED but she left prior to being seen by a physician and came to the clinic instead. She describes current pain as 'like indigestion' Mentions taking her pantoprazole every day as prescribed.  A/p Chest pain like due to GERD. Reviewed labs and EKG from earlier ED visit. No ischemic changes on EKG. Troponin negative x1. Description of pain consistent w/ GERD. - C/w pantoprazole 40mg  daily - Sucralfate suspension PRN -  Reassurance provided - Reviewed lifestyle modifications to prevent GERD exacerbation (small meals, avoid caffeine, acidic foods)  Pulmonary nodule seen on imaging study Presents w/ significant anxiety and concern over being told she may have cancer in the ED. Chest CT reviewed w/ Kimberly [name]. Noted to have multiple ~1cm spiculated pulmonary nodules on R upper and R lower lobes. Risk factor includes previous smoking history (~30 pack years. Quit in 90s), marijuana use. Discused w/ Kimberly [name] regarding f/u with pulm for either repeat imaging vs biopsy. Kimberly [name] expressed understanding  A/P Presents for management of incidental pulmonary nodules. CT abdomen from previous imaging shows opacities more consistent w/ inflammatory/infectious process in setting of COVID pneumonia earlier this year but unable to find prior dedicated chest CT. Does have prior smoking hx but was greater than 15 years ago so not considered high risk. Likely would be okay with repeated imaging but will make referral to pulm for possible further work-up considering high risk characteristics (spiculated appearance, multiple locations)  - F/u with pulmonology    Patient discussed with Dr. Jimmye Norman  -Gilberto Better, [facility] Pager: [phone]

## 2020-03-01 NOTE — Progress Notes (Signed)
Internal Medicine Clinic Attending  Case discussed with Dr. Lee  At the time of the visit.  We reviewed the resident's history and exam and pertinent patient test results.  I agree with the assessment, diagnosis, and plan of care documented in the resident's note.    

## 2020-03-09 NOTE — Progress Notes (Signed)
Internal Medicine Clinic Attending  Case discussed with Dr. Seawell  At the time of the visit.  We reviewed the resident's history and exam and pertinent patient test results.  I agree with the assessment, diagnosis, and plan of care documented in the resident's note.  

## 2020-03-14 ENCOUNTER — Other Ambulatory Visit: Payer: Self-pay

## 2020-03-14 DIAGNOSIS — J4520 Mild intermittent asthma, uncomplicated: Secondary | ICD-10-CM

## 2020-03-14 DIAGNOSIS — K219 Gastro-esophageal reflux disease without esophagitis: Secondary | ICD-10-CM

## 2020-03-14 MED ORDER — PANTOPRAZOLE SODIUM 40 MG PO TBEC: 40.0000 mg | DELAYED_RELEASE_TABLET | Freq: Every day | ORAL | 1 refills | Status: DC

## 2020-03-14 MED ORDER — FLOVENT HFA 110 MCG/ACT IN AERO: 2.0000 | INHALATION_SPRAY | Freq: Two times a day (BID) | RESPIRATORY_TRACT | 1 refills | Status: DC

## 2020-03-14 NOTE — Telephone Encounter (Signed)
FLOVENT HFA 110 MCG/ACT inhaler  pantoprazole (PROTONIX) 40 MG tablet, refill request @  [facility] ([place]), [place] - [address] Phone:  [phone]  Fax:  [phone]

## 2020-03-15 ENCOUNTER — Other Ambulatory Visit: Payer: Self-pay

## 2020-03-15 NOTE — Telephone Encounter (Signed)
Need refill on fluticasone (FLOVENT HFA) 110 MCG/ACT inhaler  pantoprazole (PROTONIX) 40 MG tablet;pt contact [facility] ([place]), [place] - [address]

## 2020-03-15 NOTE — Telephone Encounter (Signed)
RX's sent by MD yesterday to pharmacy. SChaplin, RN,BSN

## 2020-03-16 ENCOUNTER — Encounter: Payer: Self-pay | Admitting: Emergency Medicine

## 2020-03-16 ENCOUNTER — Other Ambulatory Visit: Payer: Self-pay

## 2020-03-16 ENCOUNTER — Ambulatory Visit (INDEPENDENT_AMBULATORY_CARE_PROVIDER_SITE_OTHER): Payer: Medicare Other | Admitting: Emergency Medicine

## 2020-03-16 VITALS — BP 128/78 | HR 91 | Temp 97.3°F | Ht 64.0 in | Wt 107.4 lb

## 2020-03-16 DIAGNOSIS — R911 Solitary pulmonary nodule: Secondary | ICD-10-CM | POA: Diagnosis not present

## 2020-03-16 NOTE — Assessment & Plan Note (Signed)
Bilateral pulmonary nodules, unclear etiology, some spiculated. There mixed densities and of various sizes. Explained the different possible causes to her today. She does not have any history of autoimmune disease to our knowledge. No infectious symptoms. Certainly she is at some risk for malignancy although her smoking history was minimal. I think a PET scan will help [place] here to focus on any of the areas that are hypermetabolic. Ultimately we will either plan for bronchoscopy or follow with serial imaging. I will check an ANA, RF, ANCA today.

## 2020-03-16 NOTE — Patient Instructions (Signed)
Lab work today We will arrange for a PET scan to further investigate your pulmonary nodules We will follow up in about 1 month to review your testing. Based on the testing we will probably arrange for bronchoscopy.

## 2020-03-16 NOTE — Addendum Note (Signed)
Addended by: Gavin Potters R on: 03/16/2020 11:19 AM   Modules accepted: Orders

## 2020-03-16 NOTE — Progress Notes (Signed)
Subjective:    Patient ID: Kimberly [name], female    DOB: 05/24/1956, 64 y.o.   MRN: [id number]  HPI 64 year old former smoker (5-7 pack years) with a history of former alcohol use, depression, GERD, hypertension, allergic rhinitis. She carries a history of asthma that was diagnosed in her teens, has albuterol that she rarely uses. No hx RA, SLE or auto-immune disease.  She is referred today for evaluation of an abnormal CT scan of the chest. She was in the emergency department for left chest wall pain and some mid chest burning, suspected to be musculoskeletal plus possible GERD, on 9/25 and then on 10/2.  No persistent cough, does have some PND w associated rare cough. Never hemoptysis. Still with some mid chest burning, improved. No dyspnea.   CT chest 02/27/2020 reviewed by me, shows evidence for some coronary calcification, no mediastinal or hilar lymphadenopathy, some moderate centrilobular emphysematous changes with biapical scar. There is some associated nodular density that includes a 1.2 cm focus in the right apex, a 2.3 cm focus in the left apex. There are also spiculated nodules in the right lower lobe 1.1 x 1.2 cm at the right major fissure, right apex 1.3 x 1.1 cm.   Review of Systems As per HPI  Past Medical History:  Diagnosis Date  . Alcohol abuse, in remission    since around 2005  . Alcoholic hepatitis    fatty liver on imaging- Treated  . Allergic rhinitis   . Anemia    EGD with duodenal AVM, normal colonoscopy 04/2012  . Asthma   . Callus of foot 2009  . Chest pain, musculoskeletal 2011  . Cholelithiasis    [place] 09/2008: Cholelithiasis is present, s/p cholecystectomy  . Depression   . Excessive cerumen in ear canal    since before 2000  . GERD (gastroesophageal reflux disease)   . History of blood transfusion   . Hyperlipidemia   . Hypertension   . Menopausal syndrome    Treated with estradiol in the past - discontinued 04/2012  . MENOPAUSAL SYNDROME  09/24/2006   2008 Note : The pt is adament about having this medication.  She fully understands the increased risk of heart disease and cancer that is associated with HRT and is willing to accept this risk in order to prevent the debilitating hot flashes she has off this medication.  Will continue to encourage her to try to wean herself off of this medication at our next visit.  2009 note indicated that she tr  . Otitis externa, acute Feb 2012   bilateral, treated with azithromycin after failure of neomycin drops  . Otitis media, acute Feb 2012  . PONV (postoperative nausea and vomiting)      Family History  Problem Relation Age of Onset  . Hypertension Mother   . Colon cancer Neg Hx   . Esophageal cancer Neg Hx   . Inflammatory bowel disease Neg Hx   . Liver disease Neg Hx   . Pancreatic cancer Neg Hx   . Rectal cancer Neg Hx   . Stomach cancer Neg Hx      Social History   Socioeconomic History  . Marital status: Single    Spouse name: Not on file  . Number of children: Not on file  . Years of education: Not on file  . Highest education level: Not on file  Occupational History  . Not on file  Tobacco Use  . Smoking status: Former Smoker  Types: Cigarettes    Quit date: 08/21/1995    Years since quitting: 24.5  . Smokeless tobacco: Never Used  Vaping Use  . Vaping Use: Never used  Substance and Sexual Activity  . Alcohol use: No    Comment: in remission since 2007  . Drug use: Yes    Types: Marijuana  . Sexual activity: Never  Other Topics Concern  . Not on file  Social History Narrative   2   Social Determinants of Health   Financial Resource Strain:   . Difficulty of Paying Living Expenses: Not on file  Food Insecurity:   . Worried About Charity fundraiser in the Last Year: Not on file  . Ran Out of Food in the Last Year: Not on file  Transportation Needs:   . Lack of Transportation (Medical): Not on file  . Lack of Transportation (Non-Medical): Not on  file  Physical Activity:   . Days of Exercise per Week: Not on file  . Minutes of Exercise per Session: Not on file  Stress:   . Feeling of Stress : Not on file  Social Connections:   . Frequency of Communication with Friends and Family: Not on file  . Frequency of Social Gatherings with Friends and Family: Not on file  . Attends Religious Services: Not on file  . Active Member of Clubs or Organizations: Not on file  . Attends Archivist Meetings: Not on file  . Marital Status: Not on file  Intimate Partner Violence:   . Fear of Current or Ex-Partner: Not on file  . Emotionally Abused: Not on file  . Physically Abused: Not on file  . 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 Banana     Lip swelling  . Grape (Artificial) Flavor Swelling    Allergic to grapes  . Ibuprofen Rash  . Penicillins Rash    Did it involve swelling of the face/tongue/throat, SOB, or low BP? No Did it involve sudden or severe rash/hives, skin peeling, or any reaction on the inside of your mouth or nose? No Did you need to seek medical attention at a hospital or doctor's office? No When did it last happen?more than 10 years If all above answers are "NO", may proceed with cephalosporin use.      Outpatient Medications Prior to Visit  Medication Sig Dispense Refill  . amitriptyline (ELAVIL) 50 MG tablet Take 1 tablet (50 mg total) by mouth at bedtime. 30 tablet 3  . diclofenac Sodium (VOLTAREN) 1 % GEL Apply 4 g topically 4 (four) times daily as needed. 150 g 0  . fluticasone (FLONASE) 50 MCG/ACT nasal spray USE 2 SPRAY(S) IN EACH NOSTRIL ONCE DAILY AS NEEDED FOR ALLERGIES AND RHINITIS. 16 g 0  . fluticasone (FLOVENT HFA) 110 MCG/ACT inhaler Inhale 2 puffs into the lungs 2 (two) times daily. 36 g 1  . iron polysaccharides (NIFEREX) 150 MG capsule Take 150 mg by mouth daily.    Marland Kitchen losartan (COZAAR)  25 MG tablet Take 1 tablet by mouth once daily 90 tablet 3  . pantoprazole (PROTONIX) 40 MG tablet Take 1 tablet (40 mg total) by mouth daily. 30 tablet 1  . Phenol-Glycerin (CHLORASEPTIC MAX SORE THROAT) 1.5-33 % LIQD Use as directed 2 sprays in the mouth or throat 4 (four) times daily as needed. 30 mL 0  . pravastatin (  PRAVACHOL) 40 MG tablet TAKE 1 TABLET BY MOUTH ONCE DAILY IN THE EVENING 90 tablet 0  . sucralfate (CARAFATE) 1 GM/10ML suspension Take 10 mLs (1 g total) by mouth 4 (four) times daily -  with meals and at bedtime. 420 mL 0  . cyclobenzaprine (FLEXERIL) 10 MG tablet Take 1 tablet (10 mg total) by mouth 3 (three) times daily as needed for muscle spasms. (Patient not taking: Reported on 03/16/2020) 10 tablet 0   No facility-administered medications prior to visit.        Objective:   Physical Exam Vitals:   03/16/20 0954  BP: 128/78  Pulse: 91  Temp: (!) 97.3 F (36.3 C)  TempSrc: Temporal  SpO2: 98%  Weight: 107 lb 6.4 oz (48.7 kg)  Height: 5\' 4"  (1.626 m)   Gen: Pleasant, thin, in no distress,  normal affect  ENT: No lesions,  mouth clear,  oropharynx clear, no postnasal drip  Neck: No JVD, no stridor  Lungs: No use of accessory muscles, no crackles or wheezing on normal respiration, no wheeze on forced expiration  Cardiovascular: RRR, heart sounds normal, no murmur or gallops, no peripheral edema  Musculoskeletal: No deformities, no cyanosis or clubbing  Neuro: alert, awake, non focal  Skin: Warm, no lesions or rash      Assessment & Plan:  Pulmonary nodule seen on imaging study Bilateral pulmonary nodules, unclear etiology, some spiculated. There mixed densities and of various sizes. Explained the different possible causes to her today. She does not have any history of autoimmune disease to our knowledge. No infectious symptoms. Certainly she is at some risk for malignancy although her smoking history was minimal. I think a PET scan will help [place] here to  focus on any of the areas that are hypermetabolic. Ultimately we will either plan for bronchoscopy or follow with serial imaging. I will check an ANA, RF, ANCA today.  Baltazar Apo, MD, PhD 03/16/2020, 10:23 AM [facility] [phone] or if no answer [phone]

## 2020-03-16 NOTE — Addendum Note (Signed)
Addended by: Suzzanne Cloud E on: 03/16/2020 10:34 AM   Modules accepted: Orders

## 2020-03-17 ENCOUNTER — Telehealth: Payer: Self-pay | Admitting: Emergency Medicine

## 2020-03-17 NOTE — Telephone Encounter (Signed)
Called and spoke to pt. Pt questioning if she should hold any of her morning medication prior to her PET. Pt states she normally takes her morning meds around 0600, pt's scan is at 1400. Advised pt she should take her meds like normal but per the referral note, pt should be npo 6 hours prior. Pt verbalized understanding and denied any further questions or concerns at this time.

## 2020-03-18 ENCOUNTER — Telehealth: Payer: Self-pay | Admitting: Emergency Medicine

## 2020-03-20 LAB — RHEUMATOID FACTOR: Rheumatoid fact SerPl-aCnc: <14 IU/mL (ref <14)

## 2020-03-20 LAB — ANTI-NUCLEAR AB-TITER (ANA TITER): ANA Titer 1: 1:40 {titer} — ABNORMAL HIGH

## 2020-03-20 LAB — ANCA SCREEN W REFLEX TITER: ANCA Screen: NEGATIVE

## 2020-03-20 LAB — ANA: Anti Nuclear Antibody (ANA): POSITIVE — AB

## 2020-03-21 NOTE — Telephone Encounter (Signed)
Called and spoke with patient about transportation. She states that she no longer needs it that she is going to catch the bus to her appointment. Advised patient to call us if anything changed. She expressed understanding. Nothing further needed at this time.

## 2020-03-28 ENCOUNTER — Ambulatory Visit (HOSPITAL_COMMUNITY): Payer: Medicare Other

## 2020-04-04 ENCOUNTER — Other Ambulatory Visit: Payer: Self-pay | Admitting: Internal Medicine

## 2020-04-04 DIAGNOSIS — M549 Dorsalgia, unspecified: Secondary | ICD-10-CM

## 2020-04-04 DIAGNOSIS — T7840XD Allergy, unspecified, subsequent encounter: Secondary | ICD-10-CM

## 2020-04-06 ENCOUNTER — Other Ambulatory Visit: Payer: Self-pay

## 2020-04-06 ENCOUNTER — Ambulatory Visit (HOSPITAL_COMMUNITY): Payer: Medicare Other

## 2020-04-06 ENCOUNTER — Ambulatory Visit (HOSPITAL_COMMUNITY)
Admission: RE | Admit: 2020-04-06 | Discharge: 2020-04-06 | Disposition: A | Discharge status: Home / Self Care | Payer: Medicare Other | Source: Ambulatory Visit | Attending: Emergency Medicine | Admitting: Emergency Medicine

## 2020-04-06 DIAGNOSIS — J439 Emphysema, unspecified: Secondary | ICD-10-CM | POA: Diagnosis not present

## 2020-04-06 DIAGNOSIS — I700 Atherosclerosis of aorta: Secondary | ICD-10-CM | POA: Insufficient documentation

## 2020-04-06 DIAGNOSIS — R911 Solitary pulmonary nodule: Secondary | ICD-10-CM | POA: Insufficient documentation

## 2020-04-06 LAB — GLUCOSE, CAPILLARY: Glucose-Capillary: 75 mg/dL (ref 70–99)

## 2020-04-06 MED ORDER — FLUDEOXYGLUCOSE F - 18 (FDG) INJECTION
5.3800 | Freq: Once | INTRAVENOUS | Status: AC | PRN
  Administered 2020-04-06: 5.38 via INTRAVENOUS

## 2020-04-13 ENCOUNTER — Telehealth: Payer: Self-pay | Admitting: Emergency Medicine

## 2020-04-13 NOTE — Telephone Encounter (Signed)
Pt calling requesting the results of her recent PET scan. Dr. Lamonte Sakai, please advise.

## 2020-04-13 NOTE — Telephone Encounter (Signed)
Discussed PET results with her. She is interested in possible ENB, has a lot of questions. We have an OV on 11/24 to discuss. Will need to arrange someone to transport her, stay w her, etc.

## 2020-04-15 ENCOUNTER — Telehealth: Payer: Self-pay | Admitting: Emergency Medicine

## 2020-04-15 NOTE — Telephone Encounter (Signed)
Called and spoke with patient who states that she has an appointment next week with Dr. Lamonte Sakai to discuss pet scan and possible Bronch procedure. Patient states that she has someone who can bring her to the hospital and back home but is not sure that she has someone to stay overnight with her and is wondering if she can possibly stay overnight.  Advised patient to discuss this more with Dr. Lamonte Sakai at her appointment next week. Will route to Dr. Lamonte Sakai as Juluis Rainier. If you have further recommendations let me know. Nothing further needed at this time.

## 2020-04-15 NOTE — Telephone Encounter (Signed)
Thank you - it is possible that we could admit her o/n for observation. We can discuss this in office as we discuss plan for procedure.

## 2020-04-19 ENCOUNTER — Ambulatory Visit: Payer: Medicare Other | Admitting: Gastroenterology

## 2020-04-20 ENCOUNTER — Ambulatory Visit (INDEPENDENT_AMBULATORY_CARE_PROVIDER_SITE_OTHER): Payer: Medicare Other | Admitting: Emergency Medicine

## 2020-04-20 ENCOUNTER — Encounter: Payer: Self-pay | Admitting: Emergency Medicine

## 2020-04-20 ENCOUNTER — Other Ambulatory Visit: Payer: Self-pay

## 2020-04-20 VITALS — BP 120/80 | HR 83 | Temp 97.4°F | Ht 64.0 in | Wt 112.0 lb

## 2020-04-20 DIAGNOSIS — R911 Solitary pulmonary nodule: Secondary | ICD-10-CM

## 2020-04-20 LAB — COMPREHENSIVE METABOLIC PANEL
ALT: 17 U/L (ref 0–35)
AST: 20 U/L (ref 0–37)
Albumin: 4.1 g/dL (ref 3.5–5.2)
Alkaline Phosphatase: 67 U/L (ref 39–117)
BUN: 7 mg/dL (ref 6–23)
CO2: 33 mEq/L — ABNORMAL HIGH (ref 19–32)
Calcium: 9.1 mg/dL (ref 8.4–10.5)
Chloride: 103 mEq/L (ref 96–112)
Creatinine, Ser: 0.69 mg/dL (ref 0.40–1.20)
GFR: 90.72 mL/min (ref >60.00)
Glucose, Bld: 87 mg/dL (ref 70–99)
Potassium: 3.9 mEq/L (ref 3.5–5.1)
Sodium: 140 mEq/L (ref 135–145)
Total Bilirubin: 0.8 mg/dL (ref 0.2–1.2)
Total Protein: 6.7 g/dL (ref 6.0–8.3)

## 2020-04-20 LAB — CBC WITH DIFFERENTIAL/PLATELET
Basophils Absolute: 0.1 10*3/uL (ref 0.0–0.1)
Basophils Relative: 1.1 % (ref 0.0–3.0)
Eosinophils Absolute: 0.1 10*3/uL (ref 0.0–0.7)
Eosinophils Relative: 1.8 % (ref 0.0–5.0)
HCT: 38.4 % (ref 36.0–46.0)
Hemoglobin: 12.6 g/dL (ref 12.0–15.0)
Lymphocytes Relative: 27.9 % (ref 12.0–46.0)
Lymphs Abs: 1.4 10*3/uL (ref 0.7–4.0)
MCHC: 32.7 g/dL (ref 30.0–36.0)
MCV: 95.7 fl (ref 78.0–100.0)
Monocytes Absolute: 0.4 10*3/uL (ref 0.1–1.0)
Monocytes Relative: 7 % (ref 3.0–12.0)
Neutro Abs: 3.2 10*3/uL (ref 1.4–7.7)
Neutrophils Relative %: 62.2 % (ref 43.0–77.0)
Platelets: 305 10*3/uL (ref 150.0–400.0)
RBC: 4.02 Mil/uL (ref 3.87–5.11)
RDW: 12.9 % (ref 11.5–15.5)
WBC: 5.1 10*3/uL (ref 4.0–10.5)

## 2020-04-20 LAB — PROTIME-INR
INR: 1 ratio (ref 0.8–1.0)
Prothrombin Time: 11.1 s (ref 9.6–13.1)

## 2020-04-20 NOTE — Assessment & Plan Note (Signed)
Bilateral pulmonary nodules, unclear etiology without any history of autoimmune disease.  Minimal tobacco history.  Mild hypermetabolism in the solid nodules, still concerning for possible malignancy.  Discussed ENB with her.  She does want to schedule.  I will probably keep her for observation overnight as I am not sure she has continuous support at home.  We will work on setting up a navigational bronchoscopy on 05/03/20 to evaluate your pulmonary nodules.  We will plan to watch you overnight in the hospital and then discharge to home 12/8 if all goes smoothly.  Labs today Follow with Dr Lamonte Sakai in 1 month

## 2020-04-20 NOTE — Progress Notes (Signed)
   Subjective:    Patient ID: Kimberly [name], female    DOB: [birth year], 64 y.o.   MRN: [id number]  HPI 64 year old former smoker (5-7 pack years) with a history of former alcohol use, depression, GERD, hypertension, allergic rhinitis. She carries a history of asthma that was diagnosed in her teens, has albuterol that she rarely uses. No hx RA, SLE or auto-immune disease.  She is referred today for evaluation of an abnormal CT scan of the chest. She was in the emergency department for left chest wall pain and some mid chest burning, suspected to be musculoskeletal plus possible GERD, on 9/25 and then on 10/2.  No persistent cough, does have some PND w associated rare cough. Never hemoptysis. Still with some mid chest burning, improved. No dyspnea.   CT chest 02/27/2020 reviewed by me, shows evidence for some coronary calcification, no mediastinal or hilar lymphadenopathy, some moderate centrilobular emphysematous changes with biapical scar. There is some associated nodular density that includes a 1.2 cm focus in the right apex, a 2.3 cm focus in the left apex. There are also spiculated nodules in the right lower lobe 1.1 x 1.2 cm at the right major fissure, right apex 1.3 x 1.1 cm.  R OV 04/20/2020 --follow-up visit for patient with former tobacco, depression, GERD, hypertension, rhinitis, possible asthma.  She has scattered pulmonary nodular disease of unclear etiology identified on CT chest 02/27/2020.  2.3 cm focus left apex, spiculated nodule 1.1 x 1.2 cm right lower lobe, right apex 1.3 x 1.1 cm.  PET scan performed on 04/06/2020 reviewed by me, shows multiple solid and scattered nodules.  The 1.2 right lower lobe solid nodule has an SUV of 3.0, there are scattered groundglass nodules as well.  No uptake in the mediastinal lymph nodes. She feels well. No cough.    Review of Systems As per HPI      Objective:   Physical Exam Vitals:   04/20/20 1017  BP: 120/80  Pulse: 83  Temp: (!)  97.4 F (36.3 C)  TempSrc: Temporal  SpO2: 100%  Weight: 112 lb (50.8 kg)  Height: 5\' 4"  (1.626 m)   Gen: Pleasant, thin, in no distress,  normal affect  ENT: No lesions,  mouth clear,  oropharynx clear, no postnasal drip  Neck: No JVD, no stridor  Lungs: No use of accessory muscles, no crackles or wheezing on normal respiration, no wheeze on forced expiration  Cardiovascular: RRR, heart sounds normal, no murmur or gallops, no peripheral edema  Musculoskeletal: No deformities, no cyanosis or clubbing  Neuro: alert, awake, non focal  Skin: Warm, no lesions or rash      Assessment & Plan:  Pulmonary nodule seen on imaging study Bilateral pulmonary nodules, unclear etiology without any history of autoimmune disease.  Minimal tobacco history.  Mild hypermetabolism in the solid nodules, still concerning for possible malignancy.  Discussed ENB with her.  She does want to schedule.  I will probably keep her for observation overnight as I am not sure she has continuous support at home.  We will work on setting up a navigational bronchoscopy on 05/03/20 to evaluate your pulmonary nodules.  We will plan to watch you overnight in the hospital and then discharge to home 12/8 if all goes smoothly.  Labs today Follow with Dr Lamonte Sakai in 1 month  Baltazar Apo, MD, PhD 04/20/2020, 10:42 AM [facility] [phone] or if no answer [phone]

## 2020-04-20 NOTE — Patient Instructions (Addendum)
We will work on setting up a navigational bronchoscopy on 05/03/20 to evaluate your pulmonary nodules.  We will plan to watch you overnight in the hospital and then discharge to home 12/8 if all goes smoothly.  Labs today Follow with Dr Lamonte Sakai in 1 month

## 2020-04-20 NOTE — H&P (View-Only) (Signed)
   Subjective:    Patient ID: Kimberly [name], female    DOB: 04/25/1956, 64 y.o.   MRN: [id number]  HPI 65 year old former smoker (5-7 pack years) with a history of former alcohol use, depression, GERD, hypertension, allergic rhinitis. She carries a history of asthma that was diagnosed in her teens, has albuterol that she rarely uses. No hx RA, SLE or auto-immune disease.  She is referred today for evaluation of an abnormal CT scan of the chest. She was in the emergency department for left chest wall pain and some mid chest burning, suspected to be musculoskeletal plus possible GERD, on 9/25 and then on 10/2.  No persistent cough, does have some PND w associated rare cough. Never hemoptysis. Still with some mid chest burning, improved. No dyspnea.   CT chest 02/27/2020 reviewed by me, shows evidence for some coronary calcification, no mediastinal or hilar lymphadenopathy, some moderate centrilobular emphysematous changes with biapical scar. There is some associated nodular density that includes a 1.2 cm focus in the right apex, a 2.3 cm focus in the left apex. There are also spiculated nodules in the right lower lobe 1.1 x 1.2 cm at the right major fissure, right apex 1.3 x 1.1 cm.  R OV 04/20/2020 --follow-up visit for patient with former tobacco, depression, GERD, hypertension, rhinitis, possible asthma.  She has scattered pulmonary nodular disease of unclear etiology identified on CT chest 02/27/2020.  2.3 cm focus left apex, spiculated nodule 1.1 x 1.2 cm right lower lobe, right apex 1.3 x 1.1 cm.  PET scan performed on 04/06/2020 reviewed by me, shows multiple solid and scattered nodules.  The 1.2 right lower lobe solid nodule has an SUV of 3.0, there are scattered groundglass nodules as well.  No uptake in the mediastinal lymph nodes. She feels well. No cough.    Review of Systems As per HPI      Objective:   Physical Exam Vitals:   04/20/20 1017  BP: 120/80  Pulse: 83  Temp: (!)  97.4 F (36.3 C)  TempSrc: Temporal  SpO2: 100%  Weight: 112 lb (50.8 kg)  Height: 5\' 4"  (1.626 m)   Gen: Pleasant, thin, in no distress,  normal affect  ENT: No lesions,  mouth clear,  oropharynx clear, no postnasal drip  Neck: No JVD, no stridor  Lungs: No use of accessory muscles, no crackles or wheezing on normal respiration, no wheeze on forced expiration  Cardiovascular: RRR, heart sounds normal, no murmur or gallops, no peripheral edema  Musculoskeletal: No deformities, no cyanosis or clubbing  Neuro: alert, awake, non focal  Skin: Warm, no lesions or rash      Assessment & Plan:  Pulmonary nodule seen on imaging study Bilateral pulmonary nodules, unclear etiology without any history of autoimmune disease.  Minimal tobacco history.  Mild hypermetabolism in the solid nodules, still concerning for possible malignancy.  Discussed ENB with her.  She does want to schedule.  I will probably keep her for observation overnight as I am not sure she has continuous support at home.  We will work on setting up a navigational bronchoscopy on 05/03/20 to evaluate your pulmonary nodules.  We will plan to watch you overnight in the hospital and then discharge to home 12/8 if all goes smoothly.  Labs today Follow with Dr Lamonte Sakai in 1 month  Baltazar Apo, MD, PhD 04/20/2020, 10:42 AM [facility] [phone] or if no answer [phone]

## 2020-04-20 NOTE — Addendum Note (Signed)
Addended by: Gavin Potters R on: 04/20/2020 10:46 AM   Modules accepted: Orders

## 2020-04-20 NOTE — Addendum Note (Signed)
Addended by: Suzzanne Cloud E on: 04/20/2020 10:49 AM   Modules accepted: Orders

## 2020-04-25 ENCOUNTER — Other Ambulatory Visit: Payer: Self-pay | Admitting: Internal Medicine

## 2020-04-25 DIAGNOSIS — E785 Hyperlipidemia, unspecified: Secondary | ICD-10-CM

## 2020-04-30 ENCOUNTER — Other Ambulatory Visit (HOSPITAL_COMMUNITY)
Admission: RE | Admit: 2020-04-30 | Discharge: 2020-04-30 | Disposition: A | Discharge status: Home / Self Care | Payer: Medicare Other | Source: Ambulatory Visit | Attending: Emergency Medicine | Admitting: Emergency Medicine

## 2020-04-30 DIAGNOSIS — Z20822 Contact with and (suspected) exposure to covid-19: Secondary | ICD-10-CM | POA: Insufficient documentation

## 2020-04-30 DIAGNOSIS — Z01812 Encounter for preprocedural laboratory examination: Secondary | ICD-10-CM | POA: Insufficient documentation

## 2020-05-01 LAB — SARS CORONAVIRUS 2 (TAT 6-24 HRS): SARS Coronavirus 2: NEGATIVE

## 2020-05-02 ENCOUNTER — Encounter (HOSPITAL_COMMUNITY): Payer: Self-pay | Admitting: Emergency Medicine

## 2020-05-02 ENCOUNTER — Telehealth: Payer: Self-pay | Admitting: Emergency Medicine

## 2020-05-02 ENCOUNTER — Ambulatory Visit (HOSPITAL_COMMUNITY): Payer: Medicare Other

## 2020-05-02 ENCOUNTER — Telehealth: Payer: Self-pay | Admitting: *Deleted

## 2020-05-02 DIAGNOSIS — R911 Solitary pulmonary nodule: Secondary | ICD-10-CM

## 2020-05-02 NOTE — Telephone Encounter (Signed)
I was just notified that patient's CT chest from October cannot be converted to superD format. I need to get a stat CT chest Super d on her, either today or I will have to order it when she is in the pre-op area tomorrow for her bronch.   I placed the order, although I know today may not be possible.

## 2020-05-02 NOTE — Telephone Encounter (Signed)
I was able to get pt scheduled at [facility] at 5:00, she will need to arrive at 4:45.  I had to leave pt a vm to call me.  I have called her dtr and dtr has not talked to today.  I told Manuela Schwartz in [facility] to go ahead and put pt on schedule and I will call back in a little bit and cancel if pt doesn't call me back.

## 2020-05-02 NOTE — Telephone Encounter (Signed)
I just spoke to [facility] & pt was not told she could get CT in the morning.  Pt does say she is riding bus to the hospital in the morning 7:00-7:30 and wants to get CT then.  Dr Lamonte Sakai do you need to put in new order?? Or can they use the emergency one you put in this evening?

## 2020-05-02 NOTE — Progress Notes (Signed)
Spoke with pt for pre-op call. Pt denies cardiac history or Diabetes. Pt states she was told to arrive between 8 AM and 8:15 AM Tuesday because she needs a CT scan done. I instructed her to check in Admitting and then come to Short Stay. Pt voiced understanding.  Covid test done 04/30/20 and it's negative.  Pt states she's been in quarantine since the test was done and understands that she stays in quarantine until she comes to the hospital tomorrow.

## 2020-05-02 NOTE — Telephone Encounter (Signed)
Spoke to pt she does not have transportation to get to [facility] today she is going early on Tuesday am to get there by the bus dr byrum told her he will admit her overnight she is aware of these appt ct dept called her Kimberly [name]

## 2020-05-02 NOTE — Telephone Encounter (Signed)
-----   Message from Joellen Jersey sent at 04/25/2020  9:01 AM EST ----- Regarding: ENB Enb 05/03/20@11 :15@[facility]  endo covid 04/30/20@11 :15

## 2020-05-03 ENCOUNTER — Observation Stay (HOSPITAL_COMMUNITY)
Admission: RE | Admit: 2020-05-03 | Discharge: 2020-05-04 | Disposition: A | Discharge status: Home / Self Care | Payer: Medicare Other | Attending: Emergency Medicine | Admitting: Emergency Medicine

## 2020-05-03 ENCOUNTER — Observation Stay (HOSPITAL_COMMUNITY): Payer: Medicare Other

## 2020-05-03 ENCOUNTER — Encounter (HOSPITAL_COMMUNITY): Payer: Self-pay | Admitting: Emergency Medicine

## 2020-05-03 ENCOUNTER — Other Ambulatory Visit: Payer: Self-pay

## 2020-05-03 ENCOUNTER — Observation Stay (HOSPITAL_COMMUNITY): Payer: Medicare Other | Admitting: Anesthesiology

## 2020-05-03 ENCOUNTER — Encounter (HOSPITAL_COMMUNITY)
Admission: RE | Disposition: A | Discharge status: Home / Self Care | Payer: Self-pay | Source: Home / Self Care | Attending: Emergency Medicine

## 2020-05-03 ENCOUNTER — Ambulatory Visit (HOSPITAL_COMMUNITY): Payer: Medicare Other

## 2020-05-03 DIAGNOSIS — J432 Centrilobular emphysema: Secondary | ICD-10-CM | POA: Diagnosis not present

## 2020-05-03 DIAGNOSIS — R911 Solitary pulmonary nodule: Principal | ICD-10-CM | POA: Insufficient documentation

## 2020-05-03 DIAGNOSIS — E785 Hyperlipidemia, unspecified: Secondary | ICD-10-CM | POA: Diagnosis not present

## 2020-05-03 DIAGNOSIS — R846 Abnormal cytological findings in specimens from respiratory organs and thorax: Secondary | ICD-10-CM | POA: Diagnosis not present

## 2020-05-03 DIAGNOSIS — Z87891 Personal history of nicotine dependence: Secondary | ICD-10-CM | POA: Insufficient documentation

## 2020-05-03 DIAGNOSIS — R918 Other nonspecific abnormal finding of lung field: Secondary | ICD-10-CM | POA: Insufficient documentation

## 2020-05-03 DIAGNOSIS — Z20822 Contact with and (suspected) exposure to covid-19: Secondary | ICD-10-CM | POA: Diagnosis not present

## 2020-05-03 DIAGNOSIS — Z9889 Other specified postprocedural states: Secondary | ICD-10-CM

## 2020-05-03 DIAGNOSIS — Z79899 Other long term (current) drug therapy: Secondary | ICD-10-CM | POA: Insufficient documentation

## 2020-05-03 DIAGNOSIS — I10 Essential (primary) hypertension: Secondary | ICD-10-CM | POA: Diagnosis not present

## 2020-05-03 DIAGNOSIS — D509 Iron deficiency anemia, unspecified: Secondary | ICD-10-CM | POA: Diagnosis not present

## 2020-05-03 DIAGNOSIS — I2510 Atherosclerotic heart disease of native coronary artery without angina pectoris: Secondary | ICD-10-CM | POA: Diagnosis not present

## 2020-05-03 DIAGNOSIS — J45909 Unspecified asthma, uncomplicated: Secondary | ICD-10-CM | POA: Diagnosis not present

## 2020-05-03 DIAGNOSIS — I700 Atherosclerosis of aorta: Secondary | ICD-10-CM | POA: Diagnosis not present

## 2020-05-03 DIAGNOSIS — R0789 Other chest pain: Secondary | ICD-10-CM | POA: Diagnosis present

## 2020-05-03 HISTORY — PX: BRONCHIAL BIOPSY: SHX5109

## 2020-05-03 HISTORY — PX: FIDUCIAL MARKER PLACEMENT: SHX6858

## 2020-05-03 HISTORY — PX: BRONCHIAL NEEDLE ASPIRATION BIOPSY: SHX5106

## 2020-05-03 HISTORY — PX: BRONCHIAL BRUSHINGS: SHX5108

## 2020-05-03 HISTORY — PX: VIDEO BRONCHOSCOPY WITH ENDOBRONCHIAL NAVIGATION: SHX6175

## 2020-05-03 LAB — COMPREHENSIVE METABOLIC PANEL
ALT: 14 U/L (ref 0–44)
AST: 17 U/L (ref 15–41)
Albumin: 3.9 g/dL (ref 3.5–5.0)
Alkaline Phosphatase: 63 U/L (ref 38–126)
Anion gap: 12 (ref 5–15)
BUN: 6 mg/dL — ABNORMAL LOW (ref 8–23)
CO2: 26 mmol/L (ref 22–32)
Calcium: 9.3 mg/dL (ref 8.9–10.3)
Chloride: 105 mmol/L (ref 98–111)
Creatinine, Ser: 0.74 mg/dL (ref 0.44–1.00)
GFR, Estimated: >60 mL/min (ref >60)
Glucose, Bld: 94 mg/dL (ref 70–99)
Potassium: 3.3 mmol/L — ABNORMAL LOW (ref 3.5–5.1)
Sodium: 143 mmol/L (ref 135–145)
Total Bilirubin: 0.6 mg/dL (ref 0.3–1.2)
Total Protein: 6.7 g/dL (ref 6.5–8.1)

## 2020-05-03 LAB — CBC
HCT: 41.3 % (ref 36.0–46.0)
Hemoglobin: 12.7 g/dL (ref 12.0–15.0)
MCH: 30.5 pg (ref 26.0–34.0)
MCHC: 30.8 g/dL (ref 30.0–36.0)
MCV: 99.3 fL (ref 80.0–100.0)
Platelets: 271 10*3/uL (ref 150–400)
RBC: 4.16 MIL/uL (ref 3.87–5.11)
RDW: 11.6 % (ref 11.5–15.5)
WBC: 5.6 10*3/uL (ref 4.0–10.5)
nRBC: 0 % (ref 0.0–0.2)

## 2020-05-03 LAB — RESP PANEL BY RT-PCR (RSV, FLU A&B, COVID)  RVPGX2
Influenza A by PCR: NEGATIVE
Influenza B by PCR: NEGATIVE
Resp Syncytial Virus by PCR: NEGATIVE
SARS Coronavirus 2 by RT PCR: NEGATIVE

## 2020-05-03 LAB — PROTIME-INR
INR: 1 (ref 0.8–1.2)
Prothrombin Time: 12.5 seconds (ref 11.4–15.2)

## 2020-05-03 LAB — APTT: aPTT: 33 seconds (ref 24–36)

## 2020-05-03 SURGERY — VIDEO BRONCHOSCOPY WITH ENDOBRONCHIAL NAVIGATION
Anesthesia: General

## 2020-05-03 MED ORDER — ROCURONIUM BROMIDE 10 MG/ML (PF) SYRINGE
PREFILLED_SYRINGE | INTRAVENOUS | Status: DC | PRN
  Administered 2020-05-03: 50 mg via INTRAVENOUS
  Administered 2020-05-03: 20 mg via INTRAVENOUS

## 2020-05-03 MED ORDER — LIDOCAINE 2% (20 MG/ML) 5 ML SYRINGE
INTRAMUSCULAR | Status: DC | PRN
  Administered 2020-05-03: 60 mg via INTRAVENOUS

## 2020-05-03 MED ORDER — FLUTICASONE PROPIONATE 50 MCG/ACT NA SUSP
2.0000 | Freq: Every day | NASAL | Status: DC | PRN
  Filled 2020-05-03: qty 16

## 2020-05-03 MED ORDER — DEXAMETHASONE SODIUM PHOSPHATE 10 MG/ML IJ SOLN
INTRAMUSCULAR | Status: DC | PRN
  Administered 2020-05-03: 5 mg via INTRAVENOUS

## 2020-05-03 MED ORDER — LACTATED RINGERS IV SOLN: INTRAVENOUS | Status: DC

## 2020-05-03 MED ORDER — CHLORHEXIDINE GLUCONATE 0.12 % MT SOLN
15.0000 mL | Freq: Once | OROMUCOSAL | Status: AC
  Administered 2020-05-03: 15 mL via OROMUCOSAL
  Filled 2020-05-03 (×2): qty 15

## 2020-05-03 MED ORDER — PHENOL 1.4 % MT LIQD: 2.0000 | Freq: Four times a day (QID) | OROMUCOSAL | Status: DC | PRN

## 2020-05-03 MED ORDER — FENTANYL CITRATE (PF) 100 MCG/2ML IJ SOLN
INTRAMUSCULAR | Status: DC | PRN
Start: 2020-05-03 — End: 2020-05-03
  Administered 2020-05-03: 25 ug via INTRAVENOUS
  Administered 2020-05-03: 50 ug via INTRAVENOUS
  Administered 2020-05-03: 25 ug via INTRAVENOUS

## 2020-05-03 MED ORDER — ACETAMINOPHEN 10 MG/ML IV SOLN: 1000.0000 mg | Freq: Once | INTRAVENOUS | Status: DC | PRN

## 2020-05-03 MED ORDER — AMITRIPTYLINE HCL 25 MG PO TABS
50.0000 mg | ORAL_TABLET | Freq: Every day | ORAL | Status: DC
  Administered 2020-05-03: 50 mg via ORAL
  Filled 2020-05-03: qty 2

## 2020-05-03 MED ORDER — ONDANSETRON HCL 4 MG/2ML IJ SOLN
INTRAMUSCULAR | Status: DC | PRN
  Administered 2020-05-03: 4 mg via INTRAVENOUS

## 2020-05-03 MED ORDER — DICLOFENAC SODIUM 1 % EX GEL
4.0000 g | Freq: Four times a day (QID) | CUTANEOUS | Status: DC | PRN
  Filled 2020-05-03: qty 100

## 2020-05-03 MED ORDER — CYCLOBENZAPRINE HCL 5 MG PO TABS: 5.0000 mg | ORAL_TABLET | Freq: Every evening | ORAL | Status: DC | PRN

## 2020-05-03 MED ORDER — PRAVASTATIN SODIUM 40 MG PO TABS
40.0000 mg | ORAL_TABLET | Freq: Every day | ORAL | Status: DC
  Administered 2020-05-03: 40 mg via ORAL
  Filled 2020-05-03: qty 1

## 2020-05-03 MED ORDER — MIDAZOLAM HCL 2 MG/2ML IJ SOLN
INTRAMUSCULAR | Status: DC | PRN
  Administered 2020-05-03: 2 mg via INTRAVENOUS

## 2020-05-03 MED ORDER — LOSARTAN POTASSIUM 25 MG PO TABS
25.0000 mg | ORAL_TABLET | Freq: Every day | ORAL | Status: DC
  Administered 2020-05-03 – 2020-05-04 (×2): 25 mg via ORAL
  Filled 2020-05-03 (×2): qty 1

## 2020-05-03 MED ORDER — SUGAMMADEX SODIUM 200 MG/2ML IV SOLN
INTRAVENOUS | Status: DC | PRN
  Administered 2020-05-03: 200 mg via INTRAVENOUS

## 2020-05-03 MED ORDER — PANTOPRAZOLE SODIUM 40 MG PO TBEC
40.0000 mg | DELAYED_RELEASE_TABLET | Freq: Every day | ORAL | Status: DC
  Administered 2020-05-04: 40 mg via ORAL
  Filled 2020-05-03: qty 1

## 2020-05-03 MED ORDER — HYDROMORPHONE HCL 1 MG/ML IJ SOLN: 0.2500 mg | INTRAMUSCULAR | Status: DC | PRN

## 2020-05-03 MED ORDER — PROPOFOL 10 MG/ML IV BOLUS
INTRAVENOUS | Status: DC | PRN
  Administered 2020-05-03: 110 mg via INTRAVENOUS

## 2020-05-03 SURGICAL SUPPLY — 1 items: SuperLock Fiducial Marker ×6 IMPLANT

## 2020-05-03 NOTE — Telephone Encounter (Signed)
Thank you :)

## 2020-05-03 NOTE — Anesthesia Postprocedure Evaluation (Signed)
Anesthesia Post Note  Patient: Kimberly [name]  Procedure(s) Performed: VIDEO BRONCHOSCOPY WITH ENDOBRONCHIAL NAVIGATION (N/A ) BRONCHIAL BRUSHINGS BRONCHIAL NEEDLE ASPIRATION BIOPSIES BRONCHIAL BIOPSIES FIDUCIAL MARKER PLACEMENT     Patient location during evaluation: PACU Anesthesia Type: General Level of consciousness: awake and alert Pain management: pain level controlled Vital Signs Assessment: post-procedure vital signs reviewed and stable Respiratory status: spontaneous breathing, nonlabored ventilation, respiratory function stable and patient connected to nasal cannula oxygen Cardiovascular status: blood pressure returned to baseline and stable Postop Assessment: no apparent nausea or vomiting Anesthetic complications: no   No complications documented.  Last Vitals:  Vitals:   05/03/20 1407 05/03/20 1436  BP: (!) 151/84 (!) 119/95  Pulse:  80  Resp:  20  Temp:    SpO2:  97%    Last Pain:  Vitals:   05/03/20 1444  TempSrc:   PainSc: 0-No pain   Pain Goal:                   Kimberly [name]

## 2020-05-03 NOTE — Transfer of Care (Signed)
Immediate Anesthesia Transfer of Care Note  Patient: Kimberly [name]  Procedure(s) Performed: VIDEO BRONCHOSCOPY WITH ENDOBRONCHIAL NAVIGATION (N/A ) BRONCHIAL BRUSHINGS BRONCHIAL NEEDLE ASPIRATION BIOPSIES BRONCHIAL BIOPSIES FIDUCIAL MARKER PLACEMENT  Patient Location: Endoscopy Unit  Anesthesia Type:General  Level of Consciousness: awake, alert  and oriented  Airway & Oxygen Therapy: Patient Spontanous Breathing  Post-op Assessment: Report given to RN  Post vital signs: Reviewed and stable  Last Vitals:  Vitals Value Taken Time  BP    Temp    Pulse    Resp    SpO2      Last Pain:  Vitals:   05/03/20 0830  TempSrc:   PainSc: 0-No pain         Complications: No complications documented.

## 2020-05-03 NOTE — H&P (Addendum)
Kimberly [name] is an 64 y.o. female.   Chief Complaint: Bilateral pulmonary nodules, mixed solid and groundglass HPI: 64 year old woman with a minimal tobacco history, former alcohol abuse, depression, GERD, hypertension, allergic rhinitis, mild asthma.  She was found to have an abnormal CT scan of the chest when she was evaluated in the emergency department for left chest wall pain and some mid chest burning that was thought to be due to GERD, musculoskeletal pain.  CT chest 02/27/2020 showed some coronary calcification, no mediastinal or hilar lymphadenopathy, some moderate centrilobular emphysema, biapical scarring and nodular disease: Specifically 1.2 cm right apical focus, 2.3 cm focus in the left apex, 1.1 x 1.2 right lower lobe nodule at the right major fissure and a 1.3 x 1.1 cm nodule in the right midlung.  Also noted were some more subtle groundglass nodules, 2 in the left midlung.  Recommendation was made to seek a tissue diagnosis via navigational bronchoscopy, scheduled for 05/03/2020.  She presents for this today.  She denies any new problems, medications, findings.  The plan is for her to be admitted for observation post procedure because she does not have reliable 24x7 support at home to watch her post procedure, general anesthesia.  Past Medical History:  Diagnosis Date  . Alcohol abuse, in remission    since around 2005  . Alcoholic hepatitis    fatty liver on imaging- Treated Hepatits C and treated  . Allergic rhinitis   . Anemia    EGD with duodenal AVM, normal colonoscopy 04/2012  . Asthma   . Callus of foot 2009  . Chest pain, musculoskeletal 2011  . Cholelithiasis    [place] 09/2008: Cholelithiasis is present, s/p cholecystectomy  . Depression   . Excessive cerumen in ear canal    since before 2000  . GERD (gastroesophageal reflux disease)   . History of blood transfusion   . Hyperlipidemia   . Hypertension   . Menopausal syndrome    Treated with estradiol in the past -  discontinued 04/2012  . MENOPAUSAL SYNDROME 09/24/2006   2008 Note : The pt is adament about having this medication.  She fully understands the increased risk of heart disease and cancer that is associated with HRT and is willing to accept this risk in order to prevent the debilitating hot flashes she has off this medication.  Will continue to encourage her to try to wean herself off of this medication at our next visit.  2009 note indicated that she tr  . Otitis externa, acute Feb 2012   bilateral, treated with azithromycin after failure of neomycin drops  . Otitis media, acute Feb 2012  . PONV (postoperative nausea and vomiting)     Past Surgical History:  Procedure Laterality Date  . ABDOMINAL HYSTERECTOMY  1980s  . BIOPSY  03/18/2019   Procedure: BIOPSY;  Surgeon: Irving Copas., MD;  Location: [place];  Service: Gastroenterology;;  . Lorin Mercy  01/06/09  . COLONOSCOPY  05/09/2012   Procedure: COLONOSCOPY;  Surgeon: Inda Castle, MD;  Location: [place];  Service: Endoscopy;  Laterality: N/A;  . COLONOSCOPY WITH PROPOFOL N/A 03/18/2019   Procedure: COLONOSCOPY WITH PROPOFOL;  Surgeon: Rush Landmark Telford Nab., MD;  Location: [place];  Service: Gastroenterology;  Laterality: N/A;  . ENTEROSCOPY N/A 03/18/2019   Procedure: ENTEROSCOPY;  Surgeon: Rush Landmark Telford Nab., MD;  Location: [place];  Service: Gastroenterology;  Laterality: N/A;  . ESOPHAGOGASTRODUODENOSCOPY  05/09/2012   Procedure: ESOPHAGOGASTRODUODENOSCOPY (EGD);  Surgeon: Inda Castle, MD;  Location: [facility];  Service: Endoscopy;  Laterality: N/A;  . HEMOSTASIS CLIP PLACEMENT  03/18/2019   Procedure: HEMOSTASIS CLIP PLACEMENT;  Surgeon: Irving Copas., MD;  Location: [place];  Service: Gastroenterology;;  . HOT HEMOSTASIS N/A 03/18/2019   Procedure: HOT HEMOSTASIS (ARGON PLASMA COAGULATION/BICAP);  Surgeon: Irving Copas., MD;  Location: [place];  Service:  Gastroenterology;  Laterality: N/A;  . POLYPECTOMY  03/18/2019   Procedure: POLYPECTOMY;  Surgeon: Rush Landmark Telford Nab., MD;  Location: [place];  Service: Gastroenterology;;  . Lia Foyer TATTOO INJECTION  03/18/2019   Procedure: SUBMUCOSAL TATTOO INJECTION;  Surgeon: Irving Copas., MD;  Location: [facility] ENDOSCOPY;  Service: Gastroenterology;;    Family History  Problem Relation Age of Onset  . Hypertension Mother   . Colon cancer Neg Hx   . Esophageal cancer Neg Hx   . Inflammatory bowel disease Neg Hx   . Liver disease Neg Hx   . Pancreatic cancer Neg Hx   . Rectal cancer Neg Hx   . Stomach cancer Neg Hx    Social History:  reports that she quit smoking about 24 years ago. Her smoking use included cigarettes. She has never used smokeless tobacco. She reports current drug use. Drug: Marijuana. She reports that she does not drink alcohol.  Allergies:  Allergies  Allergen Reactions  . Banana     Lip swelling  . Grape (Artificial) Flavor Swelling    Allergic to grapes  . Ibuprofen Rash  . Penicillins Rash    Did it involve swelling of the face/tongue/throat, SOB, or low BP? No Did it involve sudden or severe rash/hives, skin peeling, or any reaction on the inside of your mouth or nose? No Did you need to seek medical attention at a hospital or doctor's office? No When did it last happen?more than 10 years If all above answers are "NO", may proceed with cephalosporin use.     Medications Prior to Admission  Medication Sig Dispense Refill  . amitriptyline (ELAVIL) 50 MG tablet Take 1 tablet (50 mg total) by mouth at bedtime. 30 tablet 3  . fluticasone (FLOVENT HFA) 110 MCG/ACT inhaler Inhale 2 puffs into the lungs 2 (two) times daily. (Patient taking differently: Inhale 2 puffs into the lungs 2 (two) times daily as needed (respiratory issues.). ) 36 g 1  . losartan (COZAAR) 25 MG tablet Take 1 tablet by mouth once daily (Patient taking differently: Take 25 mg  by mouth daily. ) 90 tablet 3  . pantoprazole (PROTONIX) 40 MG tablet Take 1 tablet (40 mg total) by mouth daily. 30 tablet 1  . pravastatin (PRAVACHOL) 40 MG tablet TAKE 1 TABLET BY MOUTH ONCE DAILY IN THE EVENING (Patient taking differently: Take 40 mg by mouth at bedtime. ) 90 tablet 0  . cyclobenzaprine (FLEXERIL) 10 MG tablet Take 1 tablet (10 mg total) by mouth 3 (three) times daily as needed for muscle spasms. (Patient not taking: Reported on 04/26/2020) 10 tablet 0  . cyclobenzaprine (FLEXERIL) 5 MG tablet Take 5 mg by mouth at bedtime as needed for spasms.    . diclofenac Sodium (VOLTAREN) 1 % GEL APPLY 4 GRAMS TOPICALLY 4 TIMES DAILY AS NEEDED (Patient taking differently: Apply 4 g topically 4 (four) times daily as needed (pain.). ) 200 g 0  . fluticasone (FLONASE) 50 MCG/ACT nasal spray USE 2 SPRAY(S) IN EACH NOSTRIL ONCE DAILY AS NEEDED FOR ALLERGIES AND RHINITIS. (Patient taking differently: Place 2 sprays into both nostrils daily as needed (allergies.). ) 16  g 0  . Phenol-Glycerin (CHLORASEPTIC MAX SORE THROAT) 1.5-33 % LIQD Use as directed 2 sprays in the mouth or throat 4 (four) times daily as needed. (Patient taking differently: Use as directed 2 sprays in the mouth or throat 4 (four) times daily as needed (sore throat). ) 30 mL 0  . sucralfate (CARAFATE) 1 GM/10ML suspension Take 10 mLs (1 g total) by mouth 4 (four) times daily -  with meals and at bedtime. (Patient not taking: Reported on 04/26/2020) 420 mL 0    Results for orders placed or performed during the hospital encounter of 05/03/20 (from the past 48 hour(s))  Comprehensive metabolic panel     Status: Abnormal   Collection Time: 05/03/20  8:33 AM  Result Value Ref Range   Sodium 143 135 - 145 mmol/L   Potassium 3.3 (L) 3.5 - 5.1 mmol/L   Chloride 105 98 - 111 mmol/L   CO2 26 22 - 32 mmol/L   Glucose, Bld 94 70 - 99 mg/dL    Comment: Glucose reference range applies only to samples taken after fasting for at least 8  hours.   BUN 6 (L) 8 - 23 mg/dL   Creatinine, Ser 0.74 0.44 - 1.00 mg/dL   Calcium 9.3 8.9 - 10.3 mg/dL   Total Protein 6.7 6.5 - 8.1 g/dL   Albumin 3.9 3.5 - 5.0 g/dL   AST 17 15 - 41 U/L   ALT 14 0 - 44 U/L   Alkaline Phosphatase 63 38 - 126 U/L   Total Bilirubin 0.6 0.3 - 1.2 mg/dL   GFR, Estimated >60 >60 mL/min    Comment: (NOTE) Calculated using the CKD-EPI Creatinine Equation (2021)    Anion gap 12 5 - 15    Comment: Performed at [facility] [address], [place], [place] [address]  CBC     Status: None   Collection Time: 05/03/20  8:33 AM  Result Value Ref Range   WBC 5.6 4.0 - 10.5 K/uL   RBC 4.16 3.87 - 5.11 MIL/uL   Hemoglobin 12.7 12.0 - 15.0 g/dL   HCT 41.3 36 - 46 %   MCV 99.3 80.0 - 100.0 fL   MCH 30.5 26.0 - 34.0 pg   MCHC 30.8 30.0 - 36.0 g/dL   RDW 11.6 11.5 - 15.5 %   Platelets 271 150 - 400 K/uL   nRBC 0.0 0.0 - 0.2 %    Comment: Performed at [facility], [place] [address] [place], [place] [address]  Protime-INR     Status: None   Collection Time: 05/03/20  8:33 AM  Result Value Ref Range   Prothrombin Time 12.5 11.4 - 15.2 seconds   INR 1.0 0.8 - 1.2    Comment: (NOTE) INR goal varies based on device and disease states. Performed at [facility], [place] [address] [place], [place] [address]   APTT     Status: None   Collection Time: 05/03/20  8:33 AM  Result Value Ref Range   aPTT 33 24 - 36 seconds    Comment: Performed at [facility] [address] [place], [place] [address]   CT Super D Chest Wo Contrast  Result Date: 05/03/2020 CLINICAL DATA:  64 year old female with history of pulmonary nodules. Pre-surgical planning. EXAM: CT CHEST WITHOUT CONTRAST TECHNIQUE: Multidetector CT imaging of the chest was performed using thin slice collimation for electromagnetic bronchoscopy planning purposes, without intravenous contrast. COMPARISON:  Multiple priors, most recently  PET-CT 04/06/2020. Chest CT 02/27/2020. FINDINGS:  Cardiovascular: Heart size is normal. There is no significant pericardial fluid, thickening or pericardial calcification. There is aortic atherosclerosis, as well as atherosclerosis of the great vessels of the mediastinum and the coronary arteries, including calcified atherosclerotic plaque in the left main coronary artery. Mediastinum/Nodes: No pathologically enlarged mediastinal or hilar lymph nodes. Please note that accurate exclusion of hilar adenopathy is limited on noncontrast CT scans. Esophagus is unremarkable in appearance. No axillary lymphadenopathy. Lungs/Pleura: Once again there are multiple solid and sub solid pulmonary nodules scattered throughout the lungs bilaterally, most evident throughout the mid to upper lungs. The largest of these nodules is a solid nodule in the apex of the right upper lobe posteriorly (axial image 29 of series 5 and coronal image 75 of series 6) measuring 1.8 x 0.9 x 1.1 cm. Another prominent solid nodule is in the anterior aspect of the right lower lobe associated with the right major fissure (axial image 66 of series 5) measuring 1.3 x 0.8 cm. Other previously noted nodules are also similar to prior examinations. No definite new nodules, consolidative airspace disease or pleural effusions. Diffuse bronchial wall thickening with mild centrilobular and paraseptal emphysema. Bilateral apical nodular areas of pleuroparenchymal thickening and architectural distortion, similar to prior examinations, most compatible with areas of chronic post infectious or inflammatory scarring. Upper Abdomen: Aortic atherosclerosis. Musculoskeletal: There are no aggressive appearing lytic or blastic lesions noted in the visualized portions of the skeleton. IMPRESSION: 1. Multiple solid and subsolid pulmonary nodules in the lungs bilaterally, stable compared to recent prior studies, as above. 2. Aortic atherosclerosis, in addition to left main coronary artery disease. Please note that although the  presence of coronary artery calcium documents the presence of coronary artery disease, the severity of this disease and any potential stenosis cannot be assessed on this non-gated CT examination. Assessment for potential risk factor modification, dietary therapy or pharmacologic therapy may be warranted, if clinically indicated. 3. Mild diffuse bronchial wall thickening with mild centrilobular and paraseptal emphysema; imaging findings suggestive of underlying COPD. Aortic Atherosclerosis (ICD10-I70.0) and Emphysema (ICD10-J43.9). Electronically Signed   By: Vinnie Langton M.D.   On: 05/03/2020 09:06    Review of Systems No new problems reported.  As per HPI.  Weight stable.  No dyspnea, no chest pain.  Blood pressure (!) 166/84, pulse 73, temperature 98.4 F (36.9 C), temperature source Oral, resp. rate 20, height 5\' 4"  (1.626 m), last menstrual period 08/21/1982, SpO2 100 %. Physical Exam  Gen: Pleasant, thin woman, in no distress,  normal affect  ENT: No lesions,  mouth clear, edentulous, oropharynx clear, no postnasal drip  Neck: No JVD, no stridor  Lungs: No use of accessory muscles, no crackles or wheezing on normal respiration, no wheeze on forced expiration  Cardiovascular: RRR, heart sounds normal, no murmur or gallops, no peripheral edema  Abdomen: soft and NT, no HSM,  BS normal  Musculoskeletal: No deformities, no cyanosis or clubbing  Neuro: alert, awake, non focal  Skin: Warm, no lesions or rashes  PET scan 04/06/2020 reviewed by me, showed mild to moderate hypermetabolism in the 1.2 cm right superior segmental nodule.  Mild hypermetabolism in the posterior medial right upper lobe nodule.  The remaining nodules are predominantly nonsolid and did not show significant hypermetabolism.  There was no mediastinal adenopathy or hypermetabolism   Assessment/Plan Bilateral pulmonary nodules, mixed density.  Some solid with spiculation, some more groundglass in nature.  PET scan  with some mild to  moderate hypermetabolism in the solid nodules on the right.  In absence of any known autoimmune disease, exposures discussed the risk for possible malignancy with her.  We decided to proceed with navigational bronchoscopy, brushings, biopsies, washings.  She understands the risk, benefits and elect to proceed.  Mild asthma.  She is not currently on scheduled ICS, has Flovent that she uses as needed.  We will keep albuterol available to use as needed  Musculoskeletal pain, muscle spasms.  We will continue her home home amitriptyline, NSAID ointment  Hypertension.  Continue her home losartan  Hypercholesterolemia.  Continue her home pravastatin  GERD.  Plan to continue her home pantoprazole 40 mg once daily    Collene Gobble, MD 05/03/2020, 11:31 AM

## 2020-05-03 NOTE — Interval H&P Note (Signed)
History and Physical Interval Note:  05/03/2020 11:30 AM  [name]  has presented today for surgery, with the diagnosis of LUNG MASS.  The various methods of treatment have been discussed with the patient and family. After consideration of risks, benefits and other options for treatment, the patient has consented to  Procedure(s): [place] (N/A) as a surgical intervention.  The patient's history has been reviewed, patient examined, no change in status, stable for surgery.  I have reviewed the patient's chart and labs.  Questions were answered to the patient's satisfaction.     Collene Gobble

## 2020-05-03 NOTE — Anesthesia Procedure Notes (Signed)
Procedure Name: Intubation Date/Time: 05/03/2020 11:45 AM Performed by: Barrington Ellison, CRNA Pre-anesthesia Checklist: Patient identified, Emergency Drugs available, Suction available and Patient being monitored Patient Re-evaluated:Patient Re-evaluated prior to induction Oxygen Delivery Method: Circle system utilized Preoxygenation: Pre-oxygenation with 100% oxygen Induction Type: IV induction Ventilation: Mask ventilation without difficulty Laryngoscope Size: Mac and 3 Grade View: Grade I Tube type: Oral Tube size: 7.0 mm Number of attempts: 1 Airway Equipment and Method: Stylet Placement Confirmation: ETT inserted through vocal cords under direct vision,  positive ETCO2 and breath sounds checked- equal and bilateral Secured at: 21 cm Tube secured with: Tape Dental Injury: Teeth and Oropharynx as per pre-operative assessment

## 2020-05-03 NOTE — Anesthesia Preprocedure Evaluation (Addendum)
Anesthesia Evaluation  Patient identified by MRN, date of birth, ID band Patient awake    Reviewed: Patient's Chart, lab work & pertinent test results  History of Anesthesia Complications (+) PONV  Airway Mallampati: II  TM Distance: >3 FB Neck ROM: Full    Dental  (+) Upper Dentures, Lower Dentures   Pulmonary asthma , former smoker,  Lung mass   Pulmonary exam normal        Cardiovascular hypertension, Pt. on medications  Rhythm:Regular Rate:Normal     Neuro/Psych Depression    GI/Hepatic GERD  Medicated and Controlled,(+) C  Endo/Other  negative endocrine ROS  Renal/GU negative Renal ROS  negative genitourinary   Musculoskeletal negative musculoskeletal ROS (+)   Abdominal (+)  Abdomen: soft. Bowel sounds: normal.  Peds  Hematology  (+) anemia ,   Anesthesia Other Findings   Reproductive/Obstetrics                            Anesthesia Physical Anesthesia Plan  ASA: III  Anesthesia Plan: General   Post-op Pain Management:    Induction: Intravenous  PONV Risk Score and Plan: 4 or greater and Ondansetron, Dexamethasone, Propofol infusion and Treatment may vary due to age or medical condition  Airway Management Planned: Mask and Oral ETT  Additional Equipment: None  Intra-op Plan:   Post-operative Plan: Extubation in OR  Informed Consent: I have reviewed the patients History and Physical, chart, labs and discussed the procedure including the risks, benefits and alternatives for the proposed anesthesia with the patient or authorized representative who has indicated his/her understanding and acceptance.     Dental advisory given  Plan Discussed with: CRNA  Anesthesia Plan Comments: (Lab Results      Component                Value               Date                      WBC                      5.6                 05/03/2020                HGB                       12.7                05/03/2020                HCT                      41.3                05/03/2020                MCV                      99.3                05/03/2020                PLT                      271  05/03/2020          )        Anesthesia Quick Evaluation

## 2020-05-03 NOTE — Op Note (Signed)
Video Bronchoscopy with Electromagnetic Navigation Procedure Note  Date of Operation: 05/03/2020  Pre-op Diagnosis: bilateral pulmonary nodules.   Post-op Diagnosis: Same  Surgeon: Baltazar Apo  Assistants: None  Anesthesia: General endotracheal anesthesia  Operation: Flexible video fiberoptic bronchoscopy with electromagnetic navigation and biopsies.  Estimated Blood Loss: Minimal  Complications: None apparent  Indications and History: Kimberly [name] is a 64 y.o. female with minimal tobacco history.  She was found to have multiple pulmonary nodules on CT scan of the chest, 2 hypermetabolic solid nodules on the right, some scattered groundglass nodules as well.  Recommendation was made to achieve a tissue diagnosis via navigational bronchoscopy with biopsies.  The risks, benefits, complications, treatment options and expected outcomes were discussed with the patient.  The possibilities of pneumothorax, pneumonia, reaction to medication, pulmonary aspiration, perforation of a viscus, bleeding, failure to diagnose a condition and creating a complication requiring transfusion or operation were discussed with the patient who freely signed the consent.    Description of Procedure: The patient was seen in the Preoperative Area, was examined and was deemed appropriate to proceed.  The patient was taken to [facility] endoscopy room 2, identified as Kimberly [name] and the procedure verified as Flexible Video Fiberoptic Bronchoscopy.  A Time Out was held and the above information confirmed.   Prior to the date of the procedure a high-resolution CT scan of the chest was performed. Utilizing Dougherty a virtual tracheobronchial tree was generated to allow the creation of distinct navigation pathways to the patient's parenchymal abnormalities as below:  Target 1 solid right lower lobe nodule Target 2 solid right upper lobe nodule Target 3 semisolid right upper lobe nodule Target 4  semisolid left upper lobe nodule Target 5 semisolid left upper lobe nodule Target 6 groundglass left lower lobe nodule  After being taken to the operating room general anesthesia was initiated and the patient  was orally intubated. The video fiberoptic bronchoscope was introduced via the endotracheal tube and a general inspection was performed which showed normal airways throughout with the exception of some pitting and ectasia. The extendable working channel and locator guide were introduced into the bronchoscope. The distinct navigation pathways prepared prior to this procedure were then utilized to navigate to within 0.5 cm of 3 of the patient's lesions identified on CT scan.  Both right-sided solid nodules (target 1, target 2) and the largest left lower lobe groundglass nodule (target 6) underwent navigation  The extendable working channel was secured into place at each location and the locator guide was withdrawn. Under fluoroscopic guidance transbronchial needle brushings, transbronchial Wang needle biopsies, and transbronchial forceps biopsies were performed at each location (target 1, target 2, target 6) to be sent for cytology and pathology.  Three fiducial markers were placed triangulating the groundglass lesion, target 6, should radiation therapy be indicated going forward.   At the end of the procedure a general airway inspection was performed and there was no evidence of active bleeding. The bronchoscope was removed.  The patient tolerated the procedure well. There was no significant blood loss and there were no obvious complications. A post-procedural chest x-ray is pending.  Samples: 1. Transbronchial needle brushings from right lower lobe nodule Target 1 2. Transbronchial Wang needle biopsies from right lower lobe nodule Target 1 3. Transbronchial forceps biopsies from right lower lobe nodule Target 1 4. Transbronchial needle brushings from right upper lobe nodule Target 2 5.  Transbronchial Wang needle biopsies from right upper lobe nodule Target 2  6. Transbronchial forceps biopsies from right upper lobe nodule Target 2 7. Transbronchial needle brushings from left lower lobe nodule Target 6 8. Transbronchial Wang needle biopsies from left lower lobe nodule Target 6 9. Transbronchial forceps biopsies from lower lobe nodule Target 6   Plans:  The patient will be admitted for observation after she has recovered from anesthesia and after chest x-ray is reviewed. We will review the cytology, pathology results with the patient when they become available. Outpatient followup will be with Dr Lamonte Sakai.    Baltazar Apo, MD, PhD 05/03/2020, 1:30 PM [facility] [phone] or if no answer [phone]

## 2020-05-04 ENCOUNTER — Encounter (HOSPITAL_COMMUNITY): Payer: Self-pay | Admitting: Emergency Medicine

## 2020-05-04 DIAGNOSIS — I10 Essential (primary) hypertension: Secondary | ICD-10-CM | POA: Diagnosis not present

## 2020-05-04 DIAGNOSIS — Z87891 Personal history of nicotine dependence: Secondary | ICD-10-CM | POA: Diagnosis not present

## 2020-05-04 DIAGNOSIS — Z79899 Other long term (current) drug therapy: Secondary | ICD-10-CM | POA: Diagnosis not present

## 2020-05-04 DIAGNOSIS — J45909 Unspecified asthma, uncomplicated: Secondary | ICD-10-CM | POA: Diagnosis not present

## 2020-05-04 DIAGNOSIS — R911 Solitary pulmonary nodule: Secondary | ICD-10-CM | POA: Diagnosis not present

## 2020-05-04 NOTE — Discharge Summary (Signed)
Physician Discharge Summary  Patient ID: Kimberly [name] MRN: [id number] DOB/AGE: Mar 28, 1956 63 y.o.  Admit date: 05/03/2020 Discharge date: 05/04/2020  Problem List Principal Problem:   Pulmonary nodule seen on imaging study  HPI: Chief Complaint: Bilateral pulmonary nodules, mixed solid and groundglass HPI: 64 year old woman with a minimal tobacco history, former alcohol abuse, depression, GERD, hypertension, allergic rhinitis, mild asthma.  She was found to have an abnormal CT scan of the chest when she was evaluated in the emergency department for left chest wall pain and some mid chest burning that was thought to be due to GERD, musculoskeletal pain.  CT chest 02/27/2020 showed some coronary calcification, no mediastinal or hilar lymphadenopathy, some moderate centrilobular emphysema, biapical scarring and nodular disease: Specifically 1.2 cm right apical focus, 2.3 cm focus in the left apex, 1.1 x 1.2 right lower lobe nodule at the right major fissure and a 1.3 x 1.1 cm nodule in the right midlung.  Also noted were some more subtle groundglass nodules, 2 in the left midlung.  Recommendation was made to seek a tissue diagnosis via navigational bronchoscopy, scheduled for 05/03/2020.  She presents for this today.  She denies any new problems, medications, findings.  The plan is for her to be admitted for observation post procedure because she does not have reliable 24x7 support at home to watch her post procedure, general anesthesia.  Hospital Course: General: Cachectic female no acute distress at rest HEENT: No JVD or lymphadenopathy is appreciated Neuro: Grossly intact without focal defect CV: Heart sounds are regular PULM: Clear to auscultation GI: soft, bsx4 active  GU: Voids Extremities: warm/dry, negative edema  Skin: no rashes or lesions  Assessment/Plan Bilateral pulmonary nodules, mixed density.  Some solid with spiculation, some more groundglass in nature.  PET scan with some  mild to moderate hypermetabolism in the solid nodules on the right.  In absence of any known autoimmune disease, exposures discussed the risk for possible malignancy with her.  We decided to proceed with navigational bronchoscopy, brushings, biopsies, washings.  She understands the risk, benefits and elect to proceed.  Mild asthma.  She is not currently on scheduled ICS, has Flovent that she uses as needed.  We will keep albuterol available to use as needed  Musculoskeletal pain, muscle spasms.  We will continue her home home amitriptyline, NSAID ointment  Hypertension.  Continue her home losartan  Hypercholesterolemia.  Continue her home pravastatin  GERD.  Plan to continue her home pantoprazole 40 mg once daily  DC home 05/04/20 with no complications from ENB. Arrangements for sister to pick arrive have been made.   Labs at discharge Lab Results  Component Value Date   CREATININE 0.74 05/03/2020   BUN 6 (L) 05/03/2020   NA 143 05/03/2020   K 3.3 (L) 05/03/2020   CL 105 05/03/2020   CO2 26 05/03/2020   Lab Results  Component Value Date   WBC 5.6 05/03/2020   HGB 12.7 05/03/2020   HCT 41.3 05/03/2020   MCV 99.3 05/03/2020   PLT 271 05/03/2020   Lab Results  Component Value Date   ALT 14 05/03/2020   AST 17 05/03/2020   ALKPHOS 63 05/03/2020   BILITOT 0.6 05/03/2020   Lab Results  Component Value Date   INR 1.0 05/03/2020   INR 1.0 04/20/2020   INR 1.0 01/29/2019    Current radiology studies DG Chest Port 1 View  Result Date: 05/03/2020 CLINICAL DATA:  Status post bronchoscopy with biopsy. EXAM: PORTABLE CHEST 1  VIEW COMPARISON:  Chest radiographs 02/29/2020 and CT 05/03/2020 FINDINGS: The cardiac silhouette is accentuated by portable AP technique. Lung volumes are lower than on the prior radiographs. There is a new small region of hazy airspace opacity in the right mid lung. Bilateral lung nodules were better demonstrated on today CT. No sizable pleural  effusion or pneumothorax is identified. New fiducial markers project over the left lower lung. No acute osseous abnormality is seen. IMPRESSION: Mild right midlung airspace opacity following bronchoscopy. No pneumothorax. Electronically Signed   By: Sebastian Ache M.D.   On: 05/03/2020 14:25   CT Super D Chest Wo Contrast  Result Date: 05/03/2020 CLINICAL DATA:  64 year old female with history of pulmonary nodules. Pre-surgical planning. EXAM: CT CHEST WITHOUT CONTRAST TECHNIQUE: Multidetector CT imaging of the chest was performed using thin slice collimation for electromagnetic bronchoscopy planning purposes, without intravenous contrast. COMPARISON:  Multiple priors, most recently PET-CT 04/06/2020. Chest CT 02/27/2020. FINDINGS: Cardiovascular: Heart size is normal. There is no significant pericardial fluid, thickening or pericardial calcification. There is aortic atherosclerosis, as well as atherosclerosis of the great vessels of the mediastinum and the coronary arteries, including calcified atherosclerotic plaque in the left main coronary artery. Mediastinum/Nodes: No pathologically enlarged mediastinal or hilar lymph nodes. Please note that accurate exclusion of hilar adenopathy is limited on noncontrast CT scans. Esophagus is unremarkable in appearance. No axillary lymphadenopathy. Lungs/Pleura: Once again there are multiple solid and sub solid pulmonary nodules scattered throughout the lungs bilaterally, most evident throughout the mid to upper lungs. The largest of these nodules is a solid nodule in the apex of the right upper lobe posteriorly (axial image 29 of series 5 and coronal image 75 of series 6) measuring 1.8 x 0.9 x 1.1 cm. Another prominent solid nodule is in the anterior aspect of the right lower lobe associated with the right major fissure (axial image 66 of series 5) measuring 1.3 x 0.8 cm. Other previously noted nodules are also similar to prior examinations. No definite new nodules,  consolidative airspace disease or pleural effusions. Diffuse bronchial wall thickening with mild centrilobular and paraseptal emphysema. Bilateral apical nodular areas of pleuroparenchymal thickening and architectural distortion, similar to prior examinations, most compatible with areas of chronic post infectious or inflammatory scarring. Upper Abdomen: Aortic atherosclerosis. Musculoskeletal: There are no aggressive appearing lytic or blastic lesions noted in the visualized portions of the skeleton. IMPRESSION: 1. Multiple solid and subsolid pulmonary nodules in the lungs bilaterally, stable compared to recent prior studies, as above. 2. Aortic atherosclerosis, in addition to left main coronary artery disease. Please note that although the presence of coronary artery calcium documents the presence of coronary artery disease, the severity of this disease and any potential stenosis cannot be assessed on this non-gated CT examination. Assessment for potential risk factor modification, dietary therapy or pharmacologic therapy may be warranted, if clinically indicated. 3. Mild diffuse bronchial wall thickening with mild centrilobular and paraseptal emphysema; imaging findings suggestive of underlying COPD. Aortic Atherosclerosis (ICD10-I70.0) and Emphysema (ICD10-J43.9). Electronically Signed   By: Trudie Reed M.D.   On: 05/03/2020 09:06   DG C-ARM BRONCHOSCOPY  Result Date: 05/03/2020 C-ARM BRONCHOSCOPY: Fluoroscopy was utilized by the requesting physician.  No radiographic interpretation.    Disposition:  Discharge disposition: 01-Home or Self Care        Allergies as of 05/04/2020      Reactions   Banana    Lip swelling   Grape (artificial) Flavor Swelling   Allergic to grapes  Ibuprofen Rash   Penicillins Rash   Did it involve swelling of the face/tongue/throat, SOB, or low BP? No Did it involve sudden or severe rash/hives, skin peeling, or any reaction on the inside of your mouth or  nose? No Did you need to seek medical attention at a hospital or doctor's office? No When did it last happen?more than 10 years If all above answers are "NO", may proceed with cephalosporin use.      Medication List    TAKE these medications   amitriptyline 50 MG tablet Commonly known as: ELAVIL Take 1 tablet (50 mg total) by mouth at bedtime.   Chloraseptic Max Sore Throat 1.5-33 % Liqd Generic drug: Phenol-Glycerin Use as directed 2 sprays in the mouth or throat 4 (four) times daily as needed. What changed: reasons to take this   cyclobenzaprine 5 MG tablet Commonly known as: FLEXERIL Take 5 mg by mouth at bedtime as needed for spasms.   cyclobenzaprine 10 MG tablet Commonly known as: FLEXERIL Take 1 tablet (10 mg total) by mouth 3 (three) times daily as needed for muscle spasms.   diclofenac Sodium 1 % Gel Commonly known as: VOLTAREN APPLY 4 GRAMS TOPICALLY 4 TIMES DAILY AS NEEDED What changed: See the new instructions.   Flovent HFA 110 MCG/ACT inhaler Generic drug: fluticasone Inhale 2 puffs into the lungs 2 (two) times daily. What changed:   when to take this  reasons to take this   fluticasone 50 MCG/ACT nasal spray Commonly known as: FLONASE USE 2 SPRAY(S) IN EACH NOSTRIL ONCE DAILY AS NEEDED FOR ALLERGIES AND RHINITIS. What changed: See the new instructions.   losartan 25 MG tablet Commonly known as: COZAAR Take 1 tablet by mouth once daily   pantoprazole 40 MG tablet Commonly known as: PROTONIX Take 1 tablet (40 mg total) by mouth daily.   pravastatin 40 MG tablet Commonly known as: PRAVACHOL TAKE 1 TABLET BY MOUTH ONCE DAILY IN THE EVENING What changed: when to take this   sucralfate 1 GM/10ML suspension Commonly known as: Carafate Take 10 mLs (1 g total) by mouth 4 (four) times daily -  with meals and at bedtime.       Follow-up Information    Leslye Peer, MD Follow up on 05/18/2020.   Specialty: Pulmonary Disease Why: 05/18/20  2:00 pm appointment with Dr. Kennieth Francois information: [address] [place] [address] [phone]                Discharged Condition: good  Time spent on   40 minutes.  Vital signs at Discharge. Temp:  [98.1 F (36.7 C)-98.7 F (37.1 C)] 98.4 F (36.9 C) (12/08 0332) Pulse Rate:  [74-94] 74 (12/08 0332) Resp:  [13-21] 16 (12/08 0332) BP: (117-156)/(68-95) 120/68 (12/08 0332) SpO2:  [97 %-100 %] 98 % (12/08 0332) Weight:  [51 kg] 51 kg (12/07 1500)  Office follow up Special Information or instructions. Dr. Delton Coombes will call her with results of ENB  Signed: Brett Canales Nami Strawder ACNP Acute Care Nurse Practitioner [facility] Please consult Amion 05/04/2020, 8:24 AM

## 2020-05-05 LAB — CYTOLOGY - NON PAP

## 2020-05-06 ENCOUNTER — Encounter: Payer: Self-pay | Admitting: *Deleted

## 2020-05-06 NOTE — Progress Notes (Signed)

## 2020-05-09 ENCOUNTER — Other Ambulatory Visit: Payer: Self-pay | Admitting: Internal Medicine

## 2020-05-09 ENCOUNTER — Telehealth: Payer: Self-pay | Admitting: Emergency Medicine

## 2020-05-09 ENCOUNTER — Telehealth: Payer: Self-pay

## 2020-05-09 DIAGNOSIS — E785 Hyperlipidemia, unspecified: Secondary | ICD-10-CM

## 2020-05-09 DIAGNOSIS — R911 Solitary pulmonary nodule: Secondary | ICD-10-CM

## 2020-05-09 NOTE — Telephone Encounter (Signed)
PT is requesting all her medications to be refilled and sent to   [facility] ([place]), [place] - [address] Phone:  [phone]  Fax:  [phone]     She also stated that if she needs an appointment please call her back to let her know .Marland Kitchen Thanks

## 2020-05-09 NOTE — Telephone Encounter (Signed)
Pravastatin rx refill already forwarded to provider. Per last office note, patient needs 3 month f/u which will be beginning of January. Will forward to front desk to assist patient in scheduling this appt. Thank you, SChaplin, RN,BSN

## 2020-05-09 NOTE — Progress Notes (Signed)
Things That May Be Affecting Your Health:  Alcohol  Hearing loss x Pain   x Depression  Home Safety  Sexual Health   Diabetes  Lack of physical activity  Stress   Difficulty with daily activities  Loneliness  Tiredness   Drug use  Medicines  Tobacco use   Falls  Motor Vehicle Safety  Weight   Food choices  Oral Health x Other - possibly just diagnosed with lung adenocarcinoma    YOUR PERSONALIZED HEALTH PLAN : 1. Schedule your next subsequent Medicare Wellness visit in one year 2. Attend all of your regular appointments to address your medical issues 3. Complete the preventative screenings and services   Annual Wellness Visit   Medicare Covered Preventative Screenings and [place] Men and Women Who How Often Need? Date of Last Service Action  Abdominal Aortic Aneurysm Adults with AAA risk factors Once     Alcohol Misuse and Counseling All Adults Screening once a year if no alcohol misuse. Counseling up to 4 face to face sessions.     Bone Density Measurement  Adults at risk for osteoporosis Once every 2 yrs x    Lipid Panel Z13.6 All adults without CV disease Once every 5 yrs     Colorectal Cancer   Stool sample or  Colonoscopy All adults 58 and older   Once every year  Every 10 years     Depression All Adults Once a year x Today   Diabetes Screening Blood glucose, post glucose load, or GTT Z13.1  All adults at risk  Pre-diabetics  Once per year  Twice per year     Diabetes  Self-Management Training All adults Diabetics 10 hrs first year; 2 hours subsequent years. Requires Copay     Glaucoma  Diabetics  Family history of glaucoma  African Americans 41 yrs +  Hispanic Americans 47 yrs + Annually - requires coppay     Hepatitis C Z72.89 or F19.20  High Risk for HCV  Born between 1945 and 1965  Annually  Once     HIV Z11.4 All adults based on risk  Annually btw ages 24 & 64 regardless of risk  Annually > 65 yrs if at increased risk      Lung Cancer Screening Asymptomatic adults aged 17-77 with 30 pack yr history and current smoker OR quit within the last 15 yrs Annually Must have counseling and shared decision making documentation before first screen     Medical Nutrition Therapy Adults with   Diabetes  Renal disease  Kidney transplant within past 3 yrs 3 hours first year; 2 hours subsequent years     Obesity and Counseling All adults Screening once a year Counseling if BMI 30 or higher  Today   Tobacco Use Counseling Adults who use tobacco  Up to 8 visits in one year     Vaccines Z23  Hepatitis B  Influenza   Pneumonia  Adults   Once  Once every flu season  Two different vaccines separated by one year     Next Annual Wellness Visit People with Medicare Every year  Today     Services & Screenings Women Who How Often Need  Date of Last Service Action  Mammogram  Z12.31 Women over 45 One baseline ages 60-39. Annually ager 40 yrs+     Pap tests All women Annually if high risk. Every 2 yrs for normal risk women     Screening for cervical cancer with  Pap (Z01.419 nl or Z01.411abnl) &  HPV Z11.51 Women aged 75 to 58 Once every 5 yrs x    Screening pelvic and breast exams All women Annually if high risk. Every 2 yrs for normal risk women     Sexually Transmitted Diseases  Chlamydia  Gonorrhea  Syphilis All at risk adults Annually for non pregnant females at increased risk         [place] Men Who How Ofter Need  Date of Last Service Action  Prostate Cancer - DRE & PSA Men over 50 Annually.  DRE might require a copay.     Sexually Transmitted Diseases  Syphilis All at risk adults Annually for men at increased risk

## 2020-05-09 NOTE — Telephone Encounter (Signed)
Reviewed cytology results with the patient by phone.  All 3 targets, right upper lobe, right lower lobe, left lower lobe consistent with malignancy.  Suspect she will be a good candidate for SBRT.  I will refer her to [facility].  Transportation will be an issue, she will need assistance and coordination to make sure she can get to her treatments.

## 2020-05-09 NOTE — Telephone Encounter (Signed)
Patient complains of increased gas, bloating, and mild constipation since Bronchoscopy 12/7.   Patient denies any severe abdominal pain, nausea vomiting, bloody stools, chest pain, shortness of breath, hemoptysis.  Appetite is fair.  She has had some mild constipation and increased gas and bloating.  She started some Gas-X today which has helped.  Instructed her to use Gas-X as needed and to begin a stool softener for the next 3 days to see if this helps.  If she develops any red flag symptoms such as severe abdominal pain bloody stools nausea vomiting chest pain shortness of breath or fever she is to contact us immediately or go to the emergency room.  She is to continue with her office visit recommendations and follow-up We will route to Dr. Lamonte Sakai for FYI   Please contact office for sooner follow up if symptoms do not improve or worsen or seek emergency care

## 2020-05-10 ENCOUNTER — Telehealth: Payer: Self-pay | Admitting: *Deleted

## 2020-05-10 NOTE — Telephone Encounter (Signed)
Spoke w/ pt she is only taking 1 tablet daily after supper

## 2020-05-10 NOTE — Telephone Encounter (Signed)
I received referral on Kimberly [name] today. I called to schedule her to be seen Thursday at [facility].  She verbalized understanding of appt time and place.

## 2020-05-11 ENCOUNTER — Other Ambulatory Visit: Payer: Self-pay | Admitting: Medical Oncology

## 2020-05-11 DIAGNOSIS — R911 Solitary pulmonary nodule: Secondary | ICD-10-CM

## 2020-05-12 ENCOUNTER — Inpatient Hospital Stay: Payer: Medicare Other

## 2020-05-12 ENCOUNTER — Ambulatory Visit
Admission: RE | Admit: 2020-05-12 | Discharge: 2020-05-12 | Disposition: A | Discharge status: Home / Self Care | Payer: Medicare Other | Source: Ambulatory Visit | Attending: Radiation Oncology | Admitting: Radiation Oncology

## 2020-05-12 ENCOUNTER — Inpatient Hospital Stay: Payer: Medicare Other | Attending: Internal Medicine | Admitting: Internal Medicine

## 2020-05-12 ENCOUNTER — Other Ambulatory Visit: Payer: Self-pay

## 2020-05-12 ENCOUNTER — Encounter: Payer: Self-pay | Admitting: Internal Medicine

## 2020-05-12 ENCOUNTER — Other Ambulatory Visit: Payer: Self-pay | Admitting: *Deleted

## 2020-05-12 VITALS — BP 142/84 | HR 93 | Temp 96.6°F | Resp 18 | Ht 64.0 in | Wt 110.9 lb

## 2020-05-12 DIAGNOSIS — C3431 Malignant neoplasm of lower lobe, right bronchus or lung: Secondary | ICD-10-CM | POA: Diagnosis not present

## 2020-05-12 DIAGNOSIS — C3411 Malignant neoplasm of upper lobe, right bronchus or lung: Secondary | ICD-10-CM | POA: Diagnosis not present

## 2020-05-12 DIAGNOSIS — R911 Solitary pulmonary nodule: Secondary | ICD-10-CM

## 2020-05-12 DIAGNOSIS — C3490 Malignant neoplasm of unspecified part of unspecified bronchus or lung: Secondary | ICD-10-CM

## 2020-05-12 DIAGNOSIS — C3491 Malignant neoplasm of unspecified part of right bronchus or lung: Secondary | ICD-10-CM

## 2020-05-12 DIAGNOSIS — I10 Essential (primary) hypertension: Secondary | ICD-10-CM

## 2020-05-12 DIAGNOSIS — C3432 Malignant neoplasm of lower lobe, left bronchus or lung: Secondary | ICD-10-CM | POA: Diagnosis not present

## 2020-05-12 DIAGNOSIS — Z87891 Personal history of nicotine dependence: Secondary | ICD-10-CM | POA: Diagnosis not present

## 2020-05-12 LAB — CBC WITH DIFFERENTIAL (CANCER CENTER ONLY)
Abs Immature Granulocytes: 0.02 10*3/uL (ref 0.00–0.07)
Basophils Absolute: 0.1 10*3/uL (ref 0.0–0.1)
Basophils Relative: 1 %
Eosinophils Absolute: 0.1 10*3/uL (ref 0.0–0.5)
Eosinophils Relative: 2 %
HCT: 41.5 % (ref 36.0–46.0)
Hemoglobin: 12.7 g/dL (ref 12.0–15.0)
Immature Granulocytes: 0 %
Lymphocytes Relative: 29 %
Lymphs Abs: 1.8 10*3/uL (ref 0.7–4.0)
MCH: 30.8 pg (ref 26.0–34.0)
MCHC: 30.6 g/dL (ref 30.0–36.0)
MCV: 100.5 fL — ABNORMAL HIGH (ref 80.0–100.0)
Monocytes Absolute: 0.5 10*3/uL (ref 0.1–1.0)
Monocytes Relative: 8 %
Neutro Abs: 3.7 10*3/uL (ref 1.7–7.7)
Neutrophils Relative %: 60 %
Platelet Count: 308 10*3/uL (ref 150–400)
RBC: 4.13 MIL/uL (ref 3.87–5.11)
RDW: 11.5 % (ref 11.5–15.5)
WBC Count: 6.2 10*3/uL (ref 4.0–10.5)
nRBC: 0 % (ref 0.0–0.2)

## 2020-05-12 LAB — CMP (CANCER CENTER ONLY)
ALT: 16 U/L (ref 0–44)
AST: 21 U/L (ref 15–41)
Albumin: 4.1 g/dL (ref 3.5–5.0)
Alkaline Phosphatase: 82 U/L (ref 38–126)
Anion gap: 10 (ref 5–15)
BUN: <4 mg/dL — ABNORMAL LOW (ref 8–23)
CO2: 28 mmol/L (ref 22–32)
Calcium: 10 mg/dL (ref 8.9–10.3)
Chloride: 105 mmol/L (ref 98–111)
Creatinine: 0.87 mg/dL (ref 0.44–1.00)
GFR, Estimated: >60 mL/min (ref >60)
Glucose, Bld: 102 mg/dL — ABNORMAL HIGH (ref 70–99)
Potassium: 5.1 mmol/L (ref 3.5–5.1)
Sodium: 143 mmol/L (ref 135–145)
Total Bilirubin: 0.8 mg/dL (ref 0.3–1.2)
Total Protein: 7.5 g/dL (ref 6.5–8.1)

## 2020-05-12 NOTE — Progress Notes (Signed)
[place] [phone]   [phone] Multidisciplinary thoracic oncology clinic  CONSULT NOTE  REFERRING PHYSICIAN: Dr. Baltazar Apo  REASON FOR CONSULTATION:  64 years old African-American female recently diagnosed with lung cancer.  HPI Kimberly [name] is a 64 y.o. female with past medical history significant for hypertension, dyslipidemia, depression, asthma and anemia as well as remote history of smoking and quit 30 years ago.  The patient was seen at the emergency department complaining of chest pain.  She had a chest x-ray on 02/27/2020 that showed biapical nodular-like densities.  This was followed by CT scan of the chest on the same day and it showed multifocal primarily within the lung apices pericentimeter groundglass nodular-like densities including a 1.2 cm at the right apex, a 2.3 x 1.1 cm lesion at the left apex and 1.0 cm lesion within the left lower lobe.  There was also a 1.1 x 1.2 cm spiculated right lower lobe nodule leading to tethering of the right major fissure.  There is another spiculated nodule in the right apex measuring 1.3 x 1.1 cm.  CT scan of the head without contrast on February 26, 2020 showed no evidence of metastatic disease to the brain.  The patient was seen by Dr. Lamonte Sakai and had a PET scan on April 06, 2020 and it showed the spiculated nodule within the superior segment of the right lower lobe has SUV max of 3.0 concerning for a small pulmonary neoplasm.  The second worrisome spiculated nodule which is an the right upper lobe has SUV max of 2.23.  Given the small size of this nodule this degree of FDG uptake is also suspicious for underlying malignancy.  There was also persistently scattered nonsolid lung nodules identified bilaterally with low level FDG uptake.  This is still suspicious for multifocal adenocarcinoma.  On May 03, 2020 the patient underwent flexible video fiberoptic bronchoscopy with electromagnetic navigation  and biopsies under the care of Dr. Lamonte Sakai. The final cytology ([id number]) showed the brushing and fine-needle aspiration of the left lower lobe nodule showed rare atypical cells suspicious for adenocarcinoma.  The cytology from the right lower lobe as well as the right upper lobe brushing ([id number]) showed malignant cells suspicious for adenocarcinoma. The patient was referred to the multidisciplinary thoracic oncology clinic today for evaluation and discussion of her treatment options. When seen today she has no concerning complaints.  She denied having any current chest pain, shortness of breath, cough or hemoptysis.  She denied having any nausea, vomiting, diarrhea or constipation.  She denied having any headache or visual changes.  She has no weight loss or night sweats. Family history significant for mother died at age 57.  Father died in a homicide at age 30.  She has a nephew who was diagnosed with lung cancer. The patient is single and has 2 daughters.  She used to work as a IT sales professional.  She has a history of smoking less than 1 pack/day for around 20 years and quit 30 years ago.  HPI  Past Medical History:  Diagnosis Date  . Alcohol abuse, in remission    since around 2005  . Alcoholic hepatitis    fatty liver on imaging- Treated Hepatits C and treated  . Allergic rhinitis   . Anemia    EGD with duodenal AVM, normal colonoscopy 04/2012  . Asthma   . Callus of foot 2009  . Chest pain, musculoskeletal 2011  . Cholelithiasis    [place]  09/2008: Cholelithiasis is present, s/p cholecystectomy  . Depression   . Excessive cerumen in ear canal    since before 2000  . GERD (gastroesophageal reflux disease)   . History of blood transfusion   . Hyperlipidemia   . Hypertension   . Menopausal syndrome    Treated with estradiol in the past - discontinued 04/2012  . MENOPAUSAL SYNDROME 09/24/2006   2008 Note : The pt is adament about having this medication.  She fully understands  the increased risk of heart disease and cancer that is associated with HRT and is willing to accept this risk in order to prevent the debilitating hot flashes she has off this medication.  Will continue to encourage her to try to wean herself off of this medication at our next visit.  2009 note indicated that she tr  . Otitis externa, acute Feb 2012   bilateral, treated with azithromycin after failure of neomycin drops  . Otitis media, acute Feb 2012  . PONV (postoperative nausea and vomiting)     Past Surgical History:  Procedure Laterality Date  . ABDOMINAL HYSTERECTOMY  1980s  . BIOPSY  03/18/2019   Procedure: BIOPSY;  Surgeon: Rush Landmark Telford Nab., MD;  Location: [place];  Service: Gastroenterology;;  . BRONCHIAL BIOPSY  05/03/2020   Procedure: BRONCHIAL BIOPSIES;  Surgeon: Collene Gobble, MD;  Location: [facility] ENDOSCOPY;  Service: Pulmonary;;  . BRONCHIAL BRUSHINGS  05/03/2020   Procedure: BRONCHIAL BRUSHINGS;  Surgeon: Collene Gobble, MD;  Location: [facility] ENDOSCOPY;  Service: Pulmonary;;  . BRONCHIAL NEEDLE ASPIRATION BIOPSY  05/03/2020   Procedure: BRONCHIAL NEEDLE ASPIRATION BIOPSIES;  Surgeon: Collene Gobble, MD;  Location: [facility] ENDOSCOPY;  Service: Pulmonary;;  . CHOLECYSTECTOMY  01/06/09  . COLONOSCOPY  05/09/2012   Procedure: COLONOSCOPY;  Surgeon: Inda Castle, MD;  Location: [place];  Service: Endoscopy;  Laterality: N/A;  . COLONOSCOPY WITH PROPOFOL N/A 03/18/2019   Procedure: COLONOSCOPY WITH PROPOFOL;  Surgeon: Rush Landmark Telford Nab., MD;  Location: [place];  Service: Gastroenterology;  Laterality: N/A;  . ENTEROSCOPY N/A 03/18/2019   Procedure: ENTEROSCOPY;  Surgeon: Rush Landmark Telford Nab., MD;  Location: [place];  Service: Gastroenterology;  Laterality: N/A;  . ESOPHAGOGASTRODUODENOSCOPY  05/09/2012   Procedure: ESOPHAGOGASTRODUODENOSCOPY (EGD);  Surgeon: Inda Castle, MD;  Location: [place];  Service: Endoscopy;  Laterality: N/A;  . FIDUCIAL  MARKER PLACEMENT  05/03/2020   Procedure: FIDUCIAL MARKER PLACEMENT;  Surgeon: Collene Gobble, MD;  Location: [facility] ENDOSCOPY;  Service: Pulmonary;;  . HEMOSTASIS CLIP PLACEMENT  03/18/2019   Procedure: HEMOSTASIS CLIP PLACEMENT;  Surgeon: Irving Copas., MD;  Location: [place];  Service: Gastroenterology;;  . HOT HEMOSTASIS N/A 03/18/2019   Procedure: HOT HEMOSTASIS (ARGON PLASMA COAGULATION/BICAP);  Surgeon: Irving Copas., MD;  Location: [place];  Service: Gastroenterology;  Laterality: N/A;  . POLYPECTOMY  03/18/2019   Procedure: POLYPECTOMY;  Surgeon: Rush Landmark Telford Nab., MD;  Location: [place];  Service: Gastroenterology;;  . [place] INJECTION  03/18/2019   Procedure: SUBMUCOSAL TATTOO INJECTION;  Surgeon: Irving Copas., MD;  Location: [place];  Service: Gastroenterology;;  . VIDEO BRONCHOSCOPY WITH ENDOBRONCHIAL NAVIGATION N/A 05/03/2020   Procedure: VIDEO BRONCHOSCOPY WITH ENDOBRONCHIAL NAVIGATION;  Surgeon: Collene Gobble, MD;  Location: [place] ENDOSCOPY;  Service: Pulmonary;  Laterality: N/A;    Family History  Problem Relation Age of Onset  . Hypertension Mother   . Colon cancer Neg Hx   . Esophageal cancer Neg Hx   . Inflammatory bowel disease Neg  Hx   . Liver disease Neg Hx   . Pancreatic cancer Neg Hx   . Rectal cancer Neg Hx   . Stomach cancer Neg Hx     Social History Social History   Tobacco Use  . Smoking status: Former Smoker    Types: Cigarettes    Quit date: 08/21/1995    Years since quitting: 24.7  . Smokeless tobacco: Never Used  Vaping Use  . Vaping Use: Never used  Substance Use Topics  . Alcohol use: No    Comment: in remission since 2007  . Drug use: Yes    Types: Marijuana    Allergies  Allergen Reactions  . Banana     Lip swelling  . Grape (Artificial) Flavor Swelling    Allergic to grapes  . Ibuprofen Rash  . Penicillins Rash    Did it involve swelling of the face/tongue/throat,  SOB, or low BP? No Did it involve sudden or severe rash/hives, skin peeling, or any reaction on the inside of your mouth or nose? No Did you need to seek medical attention at a hospital or doctor's office? No When did it last happen?more than 10 years If all above answers are "NO", may proceed with cephalosporin use.     Current Outpatient Medications  Medication Sig Dispense Refill  . amitriptyline (ELAVIL) 50 MG tablet Take 1 tablet (50 mg total) by mouth at bedtime. 30 tablet 3  . cyclobenzaprine (FLEXERIL) 5 MG tablet Take 5 mg by mouth at bedtime as needed for spasms.    . diclofenac Sodium (VOLTAREN) 1 % GEL APPLY 4 GRAMS TOPICALLY 4 TIMES DAILY AS NEEDED (Patient taking differently: Apply 4 g topically 4 (four) times daily as needed (pain.).) 200 g 0  . fluticasone (FLONASE) 50 MCG/ACT nasal spray USE 2 SPRAY(S) IN EACH NOSTRIL ONCE DAILY AS NEEDED FOR ALLERGIES AND RHINITIS. (Patient taking differently: Place 2 sprays into both nostrils daily as needed (allergies.).) 16 g 0  . fluticasone (FLOVENT HFA) 110 MCG/ACT inhaler Inhale 2 puffs into the lungs 2 (two) times daily. (Patient taking differently: Inhale 2 puffs into the lungs 2 (two) times daily as needed (respiratory issues.).) 36 g 1  . losartan (COZAAR) 25 MG tablet Take 1 tablet by mouth once daily (Patient taking differently: Take 25 mg by mouth daily.) 90 tablet 3  . pantoprazole (PROTONIX) 40 MG tablet Take 1 tablet (40 mg total) by mouth daily. 30 tablet 1  . pravastatin (PRAVACHOL) 40 MG tablet TAKE 1 TABLET BY MOUTH ONCE DAILY IN THE EVENING 90 tablet 0  . Phenol-Glycerin (CHLORASEPTIC MAX SORE THROAT) 1.5-33 % LIQD Use as directed 2 sprays in the mouth or throat 4 (four) times daily as needed. (Patient not taking: Reported on 05/12/2020) 30 mL 0  . sucralfate (CARAFATE) 1 GM/10ML suspension Take 10 mLs (1 g total) by mouth 4 (four) times daily -  with meals and at bedtime. (Patient not taking: No sig reported) 420 mL 0    No current facility-administered medications for this visit.    Review of Systems  Constitutional: negative Eyes: negative Ears, nose, mouth, throat, and face: negative Respiratory: negative Cardiovascular: negative Gastrointestinal: negative Genitourinary:negative Integument/breast: negative Hematologic/lymphatic: negative Musculoskeletal:negative Neurological: negative Behavioral/Psych: negative Endocrine: negative Allergic/Immunologic: negative  Physical Exam  PJK:DTOIZ, healthy, no distress, well nourished, well developed and anxious SKIN: skin color, texture, turgor are normal, no rashes or significant lesions HEAD: Normocephalic, No masses, lesions, tenderness or abnormalities EYES: normal, EOMI, Conjunctiva are pink and non-injected  EARS: External ears normal, Canals clear OROPHARYNX:no exudate, no erythema and lips, buccal mucosa, and tongue normal  NECK: supple, no adenopathy, no JVD LYMPH:  no palpable lymphadenopathy, no hepatosplenomegaly BREAST:not examined LUNGS: clear to auscultation , and palpation HEART: regular rate & rhythm, no murmurs and no gallops ABDOMEN:abdomen soft, non-tender, normal bowel sounds and no masses or organomegaly BACK: Back symmetric, no curvature., No CVA tenderness EXTREMITIES:no joint deformities, effusion, or inflammation, no edema  NEURO: alert & oriented x 3 with fluent speech, no focal motor/sensory deficits  PERFORMANCE STATUS: ECOG 1  LABORATORY DATA: Lab Results  Component Value Date   WBC 6.2 05/12/2020   HGB 12.7 05/12/2020   HCT 41.5 05/12/2020   MCV 100.5 (H) 05/12/2020   PLT 308 05/12/2020      Chemistry      Component Value Date/Time   NA 143 05/03/2020 0833   NA 140 02/11/2020 1513   K 3.3 (L) 05/03/2020 0833   CL 105 05/03/2020 0833   CO2 26 05/03/2020 0833   BUN 6 (L) 05/03/2020 0833   BUN 9 02/11/2020 1513   CREATININE 0.74 05/03/2020 0833   CREATININE 0.76 10/15/2013 1329      Component  Value Date/Time   CALCIUM 9.3 05/03/2020 0833   ALKPHOS 63 05/03/2020 0833   AST 17 05/03/2020 0833   ALT 14 05/03/2020 0833   BILITOT 0.6 05/03/2020 0833   BILITOT 0.7 02/11/2020 1513       RADIOGRAPHIC STUDIES: DG Chest Port 1 View  Result Date: 05/03/2020 CLINICAL DATA:  Status post bronchoscopy with biopsy. EXAM: PORTABLE CHEST 1 VIEW COMPARISON:  Chest radiographs 02/29/2020 and CT 05/03/2020 FINDINGS: The cardiac silhouette is accentuated by portable AP technique. Lung volumes are lower than on the prior radiographs. There is a new small region of hazy airspace opacity in the right mid lung. Bilateral lung nodules were better demonstrated on today CT. No sizable pleural effusion or pneumothorax is identified. New fiducial markers project over the left lower lung. No acute osseous abnormality is seen. IMPRESSION: Mild right midlung airspace opacity following bronchoscopy. No pneumothorax. Electronically Signed   By: Logan Bores M.D.   On: 05/03/2020 14:25   CT Super D Chest Wo Contrast  Result Date: 05/03/2020 CLINICAL DATA:  64 year old female with history of pulmonary nodules. Pre-surgical planning. EXAM: CT CHEST WITHOUT CONTRAST TECHNIQUE: Multidetector CT imaging of the chest was performed using thin slice collimation for electromagnetic bronchoscopy planning purposes, without intravenous contrast. COMPARISON:  Multiple priors, most recently PET-CT 04/06/2020. Chest CT 02/27/2020. FINDINGS: Cardiovascular: Heart size is normal. There is no significant pericardial fluid, thickening or pericardial calcification. There is aortic atherosclerosis, as well as atherosclerosis of the great vessels of the mediastinum and the coronary arteries, including calcified atherosclerotic plaque in the left main coronary artery. Mediastinum/Nodes: No pathologically enlarged mediastinal or hilar lymph nodes. Please note that accurate exclusion of hilar adenopathy is limited on noncontrast CT scans.  Esophagus is unremarkable in appearance. No axillary lymphadenopathy. Lungs/Pleura: Once again there are multiple solid and sub solid pulmonary nodules scattered throughout the lungs bilaterally, most evident throughout the mid to upper lungs. The largest of these nodules is a solid nodule in the apex of the right upper lobe posteriorly (axial image 29 of series 5 and coronal image 75 of series 6) measuring 1.8 x 0.9 x 1.1 cm. Another prominent solid nodule is in the anterior aspect of the right lower lobe associated with the right major fissure (axial image 66 of series  5) measuring 1.3 x 0.8 cm. Other previously noted nodules are also similar to prior examinations. No definite new nodules, consolidative airspace disease or pleural effusions. Diffuse bronchial wall thickening with mild centrilobular and paraseptal emphysema. Bilateral apical nodular areas of pleuroparenchymal thickening and architectural distortion, similar to prior examinations, most compatible with areas of chronic post infectious or inflammatory scarring. Upper Abdomen: Aortic atherosclerosis. Musculoskeletal: There are no aggressive appearing lytic or blastic lesions noted in the visualized portions of the skeleton. IMPRESSION: 1. Multiple solid and subsolid pulmonary nodules in the lungs bilaterally, stable compared to recent prior studies, as above. 2. Aortic atherosclerosis, in addition to left main coronary artery disease. Please note that although the presence of coronary artery calcium documents the presence of coronary artery disease, the severity of this disease and any potential stenosis cannot be assessed on this non-gated CT examination. Assessment for potential risk factor modification, dietary therapy or pharmacologic therapy may be warranted, if clinically indicated. 3. Mild diffuse bronchial wall thickening with mild centrilobular and paraseptal emphysema; imaging findings suggestive of underlying COPD. Aortic Atherosclerosis  (ICD10-I70.0) and Emphysema (ICD10-J43.9). Electronically Signed   By: Vinnie Langton M.D.   On: 05/03/2020 09:06   DG C-ARM BRONCHOSCOPY  Result Date: 05/03/2020 C-ARM BRONCHOSCOPY: Fluoroscopy was utilized by the requesting physician.  No radiographic interpretation.    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   PLAN: I had a lengthy discussion with the patient today about her current disease stage, prognosis and treatment options. I discussed with the patient the option of surgical resection of some of the pulmonary nodules in addition to close monitoring and observation or SBRT to the other ones versus treatment with SBRT to multiple lesions.  She has no interest in surgery and interested in proceeding with SBRT to the confirmed disease in her left and right lung.  The patient was seen by Dr. Lisbeth Renshaw earlier today and he is planning to treat the patient with SBRT in the next 2 weeks or so. I recommended for the patient to have molecular studies by [employer] to identify any actionable mutation that the patient may benefit for treatment with adjuvant therapy for her multifocal disease.  I do not think there is sufficient material from the biopsy to do molecular studies on the tissue biopsy. I will arrange for the patient to come back for follow-up visit in 3 months for evaluation with repeat CT scan of the chest for restaging of her disease after the treatment with SBRT.  The patient is in agreement with the current plan. She was advised to call immediately if she has any concerning symptoms in the interval. The patient voices understanding of current disease status and treatment options and is in agreement with the current care plan.  All questions were answered. The patient knows to call the clinic with  any problems, questions or concerns. We can certainly see the patient much sooner if necessary.  Thank you so much for allowing me to participate in the care of [place]. I will continue to follow up the patient with you and assist in her care. The total time spent in the appointment was 70 minutes.  Disclaimer: This note was dictated with voice recognition software. Similar sounding words can inadvertently be transcribed and may not be corrected upon review.  Eilleen Kempf May 12, 2020, 3:11 PM

## 2020-05-12 NOTE — Progress Notes (Signed)
The proposed treatment discussed in cancer conference 05/12/20 is for discussion purpose only and is not a binding recommendation.  The patient was not physically examined nor present for their treatment options.  Therefore, final treatment plans cannot be decided.

## 2020-05-16 ENCOUNTER — Encounter: Payer: Self-pay | Admitting: *Deleted

## 2020-05-16 NOTE — Progress Notes (Signed)
I followed up with Dr. Julien Nordmann on when [name] should have her [employer] 360 blood test.  He would like her to get it this week.  She is coming for [facility] this week and I notified scheduling to call and schedule patient to get labs done after her sim appt.

## 2020-05-17 NOTE — Progress Notes (Signed)
Radiation Oncology         (336) [phone] ________________________________  Name: Kimberly [name] MRN: [id number]  Date: 05/12/2020  DOB: 1955/07/31  RA:[id number], Laurell Roof, DO  Byrum, Rose Fillers, MD     REFERRING PHYSICIAN: Collene Gobble, MD   DIAGNOSIS: Non-small cell lung cancer  HISTORY OF PRESENT ILLNESS::Kimberly [name] is a 64 y.o. female who is seen for an initial consultation visit regarding the patient's diagnosis of lung cancer.  The patient was seen today in multidisciplinary thoracic conference.  Recent imaging demonstrated several solid and subsolid pulmonary nodules bilaterally.  These were reviewed in conference and 3 main suspicious areas were biopsied.  The lesion in the left lung returned positive for findings indicative of adenocarcinoma and atypical cells in the other 2 lesions.  All 3 were most likely felt to represent a similar process with multifocal adenocarcinoma.  PET scan did not reveal the evidence of any distant disease.  The patient is felt to be a potential candidate for stereotactic body radiation treatment and I have been asked to see her today to discuss this.  She also was scheduled tentatively to also see surgery as well as 1 or more areas may be amenable to wedge resection.   PREVIOUS RADIATION THERAPY: No   PAST MEDICAL HISTORY:  has a past medical history of Alcohol abuse, in remission, Alcoholic hepatitis, Allergic rhinitis, Anemia, Asthma, Callus of foot (2009), Chest pain, musculoskeletal (2011), Cholelithiasis, Depression, Excessive cerumen in ear canal, GERD (gastroesophageal reflux disease), History of blood transfusion, Hyperlipidemia, Hypertension, Menopausal syndrome, MENOPAUSAL SYNDROME (09/24/2006), Otitis externa, acute (Feb 2012), Otitis media, acute (Feb 2012), and PONV (postoperative nausea and vomiting).     PAST SURGICAL HISTORY: Past Surgical History:  Procedure Laterality Date  . ABDOMINAL HYSTERECTOMY  1980s  . BIOPSY  03/18/2019    Procedure: BIOPSY;  Surgeon: Rush Landmark Telford Nab., MD;  Location: [place];  Service: Gastroenterology;;  . BRONCHIAL BIOPSY  05/03/2020   Procedure: BRONCHIAL BIOPSIES;  Surgeon: Collene Gobble, MD;  Location: [facility] ENDOSCOPY;  Service: Pulmonary;;  . BRONCHIAL BRUSHINGS  05/03/2020   Procedure: BRONCHIAL BRUSHINGS;  Surgeon: Collene Gobble, MD;  Location[facility]COPY;  Service: Pulmonary;;  . BRONCHIAL NEEDLE ASPIRATION BIOPSY  05/03/2020   Procedure: BRONCHIAL NEEDLE ASPIRATION BIOPSIES;  Surgeon: Collene Gobble, MD;  Location: [facility] ENDOSCOPY;  Service: Pulmonary;;  . CHOLECYSTECTOMY  01/06/09  . COLONOSCOPY  05/09/2012   Procedure: COLONOSCOPY;  Surgeon: Inda Castle, MD;  Location: [place];  Service: Endoscopy;  Laterality: N/A;  . COLONOSCOPY WITH PROPOFOL N/A 03/18/2019   Procedure: COLONOSCOPY WITH PROPOFOL;  Surgeon: Rush Landmark Telford Nab., MD;  Location: [place];  Service: Gastroenterology;  Laterality: N/A;  . ENTEROSCOPY N/A 03/18/2019   Procedure: ENTEROSCOPY;  Surgeon: Rush Landmark Telford Nab., MD;  Location: [place];  Service: Gastroenterology;  Laterality: N/A;  . ESOPHAGOGASTRODUODENOSCOPY  05/09/2012   Procedure: ESOPHAGOGASTRODUODENOSCOPY (EGD);  Surgeon: Inda Castle, MD;  Location: [place];  Service: Endoscopy;  Laterality: N/A;  . FIDUCIAL MARKER PLACEMENT  05/03/2020   Procedure: FIDUCIAL MARKER PLACEMENT;  Surgeon: Collene Gobble, MD;  Location: [facility] ENDOSCOPY;  Service: Pulmonary;;  . HEMOSTASIS CLIP PLACEMENT  03/18/2019   Procedure: HEMOSTASIS CLIP PLACEMENT;  Surgeon: Irving Copas., MD;  Location: [place];  Service: Gastroenterology;;  . HOT HEMOSTASIS N/A 03/18/2019   Procedure: HOT HEMOSTASIS (ARGON PLASMA COAGULATION/BICAP);  Surgeon: Irving Copas., MD;  Location: [place];  Service: Gastroenterology;  Laterality: N/A;  .  POLYPECTOMY  03/18/2019   Procedure: POLYPECTOMY;  Surgeon: Mansouraty, Telford Nab., MD;   Location: [place];  Service: Gastroenterology;;  . [place] INJECTION  03/18/2019   Procedure: SUBMUCOSAL TATTOO INJECTION;  Surgeon: Irving Copas., MD;  Location: [place];  Service: Gastroenterology;;  . VIDEO BRONCHOSCOPY WITH ENDOBRONCHIAL NAVIGATION N/A 05/03/2020   Procedure: VIDEO BRONCHOSCOPY WITH ENDOBRONCHIAL NAVIGATION;  Surgeon: Collene Gobble, MD;  Location: [place] ENDOSCOPY;  Service: Pulmonary;  Laterality: N/A;     FAMILY HISTORY: family history includes Hypertension in her mother.   SOCIAL HISTORY:  reports that she quit smoking about 24 years ago. Her smoking use included cigarettes. She has never used smokeless tobacco. She reports current drug use. Drug: Marijuana. She reports that she does not drink alcohol.   ALLERGIES: Banana, Grape (artificial) flavor, Ibuprofen, and Penicillins   MEDICATIONS:  Current Outpatient Medications  Medication Sig Dispense Refill  . amitriptyline (ELAVIL) 50 MG tablet Take 1 tablet (50 mg total) by mouth at bedtime. 30 tablet 3  . cyclobenzaprine (FLEXERIL) 5 MG tablet Take 5 mg by mouth at bedtime as needed for spasms.    . diclofenac Sodium (VOLTAREN) 1 % GEL APPLY 4 GRAMS TOPICALLY 4 TIMES DAILY AS NEEDED (Patient taking differently: Apply 4 g topically 4 (four) times daily as needed (pain.).) 200 g 0  . fluticasone (FLONASE) 50 MCG/ACT nasal spray USE 2 SPRAY(S) IN EACH NOSTRIL ONCE DAILY AS NEEDED FOR ALLERGIES AND RHINITIS. (Patient taking differently: Place 2 sprays into both nostrils daily as needed (allergies.).) 16 g 0  . fluticasone (FLOVENT HFA) 110 MCG/ACT inhaler Inhale 2 puffs into the lungs 2 (two) times daily. (Patient taking differently: Inhale 2 puffs into the lungs 2 (two) times daily as needed (respiratory issues.).) 36 g 1  . losartan (COZAAR) 25 MG tablet Take 1 tablet by mouth once daily (Patient taking differently: Take 25 mg by mouth daily.) 90 tablet 3  . pantoprazole (PROTONIX) 40 MG  tablet Take 1 tablet (40 mg total) by mouth daily. 30 tablet 1  . Phenol-Glycerin (CHLORASEPTIC MAX SORE THROAT) 1.5-33 % LIQD Use as directed 2 sprays in the mouth or throat 4 (four) times daily as needed. (Patient not taking: Reported on 05/12/2020) 30 mL 0  . pravastatin (PRAVACHOL) 40 MG tablet TAKE 1 TABLET BY MOUTH ONCE DAILY IN THE EVENING 90 tablet 0  . sucralfate (CARAFATE) 1 GM/10ML suspension Take 10 mLs (1 g total) by mouth 4 (four) times daily -  with meals and at bedtime. (Patient not taking: No sig reported) 420 mL 0   No current facility-administered medications for this encounter.     REVIEW OF SYSTEMS:  A 15 point review of systems is documented in the electronic medical record. This was obtained by the nursing staff. However, I reviewed this with the patient to discuss relevant findings and make appropriate changes.  Pertinent items are noted in HPI.    PHYSICAL EXAM:  vitals were not taken for this visit.   ECOG = 1  0 - Asymptomatic (Fully active, able to carry on all predisease activities without restriction)  1 - Symptomatic but completely ambulatory (Restricted in physically strenuous activity but ambulatory and able to carry out work of a light or sedentary nature. For example, light housework, office work)  2 - Symptomatic, <50% in bed during the day (Ambulatory and capable of all self care but unable to carry out any work activities. Up and about more than 50% of waking hours)  3 - Symptomatic, >50% in bed, but not bedbound (Capable of only limited self-care, confined to bed or chair 50% or more of waking hours)  4 - Bedbound (Completely disabled. Cannot carry on any self-care. Totally confined to bed or chair)  5 - Death   Eustace Pen MM, Creech RH, Tormey DC, et al. [phone]). "Toxicity and response criteria of the [facility]". [place] Oncol. 5 (6): 649-55  Alert, no distress   LABORATORY DATA:  Lab Results  Component Value Date   WBC  6.2 05/12/2020   HGB 12.7 05/12/2020   HCT 41.5 05/12/2020   MCV 100.5 (H) 05/12/2020   PLT 308 05/12/2020   Lab Results  Component Value Date   NA 143 05/12/2020   K 5.1 05/12/2020   CL 105 05/12/2020   CO2 28 05/12/2020   Lab Results  Component Value Date   ALT 16 05/12/2020   AST 21 05/12/2020   ALKPHOS 82 05/12/2020   BILITOT 0.8 05/12/2020      RADIOGRAPHY: DG Chest Port 1 View  Result Date: 05/03/2020 CLINICAL DATA:  Status post bronchoscopy with biopsy. EXAM: PORTABLE CHEST 1 VIEW COMPARISON:  Chest radiographs 02/29/2020 and CT 05/03/2020 FINDINGS: The cardiac silhouette is accentuated by portable AP technique. Lung volumes are lower than on the prior radiographs. There is a new small region of hazy airspace opacity in the right mid lung. Bilateral lung nodules were better demonstrated on today CT. No sizable pleural effusion or pneumothorax is identified. New fiducial markers project over the left lower lung. No acute osseous abnormality is seen. IMPRESSION: Mild right midlung airspace opacity following bronchoscopy. No pneumothorax. Electronically Signed   By: Logan Bores M.D.   On: 05/03/2020 14:25   CT Super D Chest Wo Contrast  Result Date: 05/03/2020 CLINICAL DATA:  64 year old female with history of pulmonary nodules. Pre-surgical planning. EXAM: CT CHEST WITHOUT CONTRAST TECHNIQUE: Multidetector CT imaging of the chest was performed using thin slice collimation for electromagnetic bronchoscopy planning purposes, without intravenous contrast. COMPARISON:  Multiple priors, most recently PET-CT 04/06/2020. Chest CT 02/27/2020. FINDINGS: Cardiovascular: Heart size is normal. There is no significant pericardial fluid, thickening or pericardial calcification. There is aortic atherosclerosis, as well as atherosclerosis of the great vessels of the mediastinum and the coronary arteries, including calcified atherosclerotic plaque in the left main coronary artery. Mediastinum/Nodes:  No pathologically enlarged mediastinal or hilar lymph nodes. Please note that accurate exclusion of hilar adenopathy is limited on noncontrast CT scans. Esophagus is unremarkable in appearance. No axillary lymphadenopathy. Lungs/Pleura: Once again there are multiple solid and sub solid pulmonary nodules scattered throughout the lungs bilaterally, most evident throughout the mid to upper lungs. The largest of these nodules is a solid nodule in the apex of the right upper lobe posteriorly (axial image 29 of series 5 and coronal image 75 of series 6) measuring 1.8 x 0.9 x 1.1 cm. Another prominent solid nodule is in the anterior aspect of the right lower lobe associated with the right major fissure (axial image 66 of series 5) measuring 1.3 x 0.8 cm. Other previously noted nodules are also similar to prior examinations. No definite new nodules, consolidative airspace disease or pleural effusions. Diffuse bronchial wall thickening with mild centrilobular and paraseptal emphysema. Bilateral apical nodular areas of pleuroparenchymal thickening and architectural distortion, similar to prior examinations, most compatible with areas of chronic post infectious or inflammatory scarring. Upper Abdomen: Aortic atherosclerosis. Musculoskeletal: There are no aggressive appearing lytic or blastic lesions noted  in the visualized portions of the skeleton. IMPRESSION: 1. Multiple solid and subsolid pulmonary nodules in the lungs bilaterally, stable compared to recent prior studies, as above. 2. Aortic atherosclerosis, in addition to left main coronary artery disease. Please note that although the presence of coronary artery calcium documents the presence of coronary artery disease, the severity of this disease and any potential stenosis cannot be assessed on this non-gated CT examination. Assessment for potential risk factor modification, dietary therapy or pharmacologic therapy may be warranted, if clinically indicated. 3. Mild  diffuse bronchial wall thickening with mild centrilobular and paraseptal emphysema; imaging findings suggestive of underlying COPD. Aortic Atherosclerosis (ICD10-I70.0) and Emphysema (ICD10-J43.9). Electronically Signed   By: Vinnie Langton M.D.   On: 05/03/2020 09:06   DG C-ARM BRONCHOSCOPY  Result Date: 05/03/2020 C-ARM BRONCHOSCOPY: Fluoroscopy was utilized by the requesting physician.  No radiographic interpretation.       IMPRESSION/ PLAN:  The patient was seen today in multidisciplinary thoracic clinic.  Her case was reviewed in conference and we discussed stereotactic body radiation treatment versus wedge resection for the 3 primary nodules which were seen in the right upper lobe, right lower lobe, and left lower lobe.  The patient is adamant about not wanting to proceed with surgical resection.  She is to let me know if this plan is changed but she stated that she had already thought about things and wished to proceed with radiation treatment alone.  We therefore discussed a plan for stereotactic body radiation treatment to the 3 areas within the right lung and left lung.  Each of these will be treated with 3-5 fractions of stereotactic body radiation treatment.  We discussed the rationale of treatment as well as side effects and risks.  All of her questions were answered and she indicated that she did wish to proceed with treatment as soon as this can be arranged.  She will therefore undergo simulation in the near future, most likely next week, for treatment planning.  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 Similar sounding words can inadvertently be transcribed and this note may contain transcription errors which may not have been corrected upon publication of note.**

## 2020-05-18 ENCOUNTER — Inpatient Hospital Stay: Payer: Medicare Other | Admitting: Emergency Medicine

## 2020-05-18 ENCOUNTER — Ambulatory Visit
Admission: RE | Admit: 2020-05-18 | Discharge: 2020-05-18 | Disposition: A | Discharge status: Home / Self Care | Payer: Medicare Other | Source: Ambulatory Visit | Attending: Radiation Oncology | Admitting: Radiation Oncology

## 2020-05-18 ENCOUNTER — Telehealth: Payer: Self-pay | Admitting: *Deleted

## 2020-05-18 ENCOUNTER — Encounter: Payer: Self-pay | Admitting: Internal Medicine

## 2020-05-18 ENCOUNTER — Other Ambulatory Visit: Payer: Self-pay

## 2020-05-18 ENCOUNTER — Inpatient Hospital Stay: Payer: Medicare Other

## 2020-05-18 DIAGNOSIS — C3490 Malignant neoplasm of unspecified part of unspecified bronchus or lung: Secondary | ICD-10-CM

## 2020-05-18 DIAGNOSIS — C3491 Malignant neoplasm of unspecified part of right bronchus or lung: Secondary | ICD-10-CM | POA: Insufficient documentation

## 2020-05-18 DIAGNOSIS — C3432 Malignant neoplasm of lower lobe, left bronchus or lung: Secondary | ICD-10-CM | POA: Insufficient documentation

## 2020-05-18 DIAGNOSIS — Z87891 Personal history of nicotine dependence: Secondary | ICD-10-CM | POA: Diagnosis not present

## 2020-05-18 DIAGNOSIS — C3411 Malignant neoplasm of upper lobe, right bronchus or lung: Secondary | ICD-10-CM | POA: Diagnosis not present

## 2020-05-18 DIAGNOSIS — Z510 Encounter for antineoplastic radiation therapy: Secondary | ICD-10-CM | POA: Diagnosis not present

## 2020-05-18 DIAGNOSIS — C3431 Malignant neoplasm of lower lobe, right bronchus or lung: Secondary | ICD-10-CM | POA: Diagnosis not present

## 2020-05-18 NOTE — Telephone Encounter (Signed)
Per Dr. Julien Nordmann, patient had [employer] blood test today.

## 2020-05-22 DIAGNOSIS — C3491 Malignant neoplasm of unspecified part of right bronchus or lung: Secondary | ICD-10-CM | POA: Diagnosis not present

## 2020-05-22 DIAGNOSIS — Z510 Encounter for antineoplastic radiation therapy: Secondary | ICD-10-CM | POA: Diagnosis not present

## 2020-05-22 DIAGNOSIS — Z87891 Personal history of nicotine dependence: Secondary | ICD-10-CM | POA: Diagnosis not present

## 2020-05-22 DIAGNOSIS — C3411 Malignant neoplasm of upper lobe, right bronchus or lung: Secondary | ICD-10-CM | POA: Diagnosis not present

## 2020-05-22 DIAGNOSIS — C3432 Malignant neoplasm of lower lobe, left bronchus or lung: Secondary | ICD-10-CM | POA: Diagnosis not present

## 2020-05-22 DIAGNOSIS — C3431 Malignant neoplasm of lower lobe, right bronchus or lung: Secondary | ICD-10-CM | POA: Diagnosis not present

## 2020-05-24 ENCOUNTER — Ambulatory Visit
Admission: RE | Admit: 2020-05-24 | Discharge: 2020-05-24 | Disposition: A | Discharge status: Home / Self Care | Payer: Medicare Other | Source: Ambulatory Visit | Attending: Radiation Oncology | Admitting: Radiation Oncology

## 2020-05-24 ENCOUNTER — Telehealth: Payer: Self-pay

## 2020-05-24 ENCOUNTER — Other Ambulatory Visit: Payer: Self-pay

## 2020-05-24 DIAGNOSIS — C3491 Malignant neoplasm of unspecified part of right bronchus or lung: Secondary | ICD-10-CM | POA: Diagnosis not present

## 2020-05-24 DIAGNOSIS — C3432 Malignant neoplasm of lower lobe, left bronchus or lung: Secondary | ICD-10-CM | POA: Diagnosis not present

## 2020-05-24 DIAGNOSIS — Z510 Encounter for antineoplastic radiation therapy: Secondary | ICD-10-CM | POA: Diagnosis not present

## 2020-05-24 NOTE — Telephone Encounter (Signed)
Pt is requesting a walker, please call pt back.

## 2020-05-25 ENCOUNTER — Encounter: Payer: Self-pay | Admitting: *Deleted

## 2020-05-25 NOTE — Progress Notes (Signed)
I followed up on Kimberly [name]'s blood sample that went to [facility] for molecular testing.  On patient portal, it shows they received the lab and is is in progress.

## 2020-05-26 ENCOUNTER — Other Ambulatory Visit: Payer: Self-pay

## 2020-05-26 ENCOUNTER — Ambulatory Visit
Admission: RE | Admit: 2020-05-26 | Discharge: 2020-05-26 | Disposition: A | Discharge status: Home / Self Care | Payer: Medicare Other | Source: Ambulatory Visit | Attending: Radiation Oncology | Admitting: Radiation Oncology

## 2020-05-26 DIAGNOSIS — C3432 Malignant neoplasm of lower lobe, left bronchus or lung: Secondary | ICD-10-CM | POA: Diagnosis not present

## 2020-05-26 DIAGNOSIS — C3491 Malignant neoplasm of unspecified part of right bronchus or lung: Secondary | ICD-10-CM | POA: Diagnosis not present

## 2020-05-26 DIAGNOSIS — Z510 Encounter for antineoplastic radiation therapy: Secondary | ICD-10-CM | POA: Diagnosis not present

## 2020-05-27 DIAGNOSIS — C3491 Malignant neoplasm of unspecified part of right bronchus or lung: Secondary | ICD-10-CM | POA: Diagnosis not present

## 2020-05-31 ENCOUNTER — Ambulatory Visit
Admission: RE | Admit: 2020-05-31 | Discharge: 2020-05-31 | Disposition: A | Discharge status: Home / Self Care | Payer: Medicare Other | Source: Ambulatory Visit | Attending: Radiation Oncology | Admitting: Radiation Oncology

## 2020-05-31 ENCOUNTER — Encounter: Payer: Self-pay | Admitting: Radiation Oncology

## 2020-05-31 ENCOUNTER — Other Ambulatory Visit: Payer: Self-pay

## 2020-05-31 ENCOUNTER — Other Ambulatory Visit: Payer: Self-pay | Admitting: Student

## 2020-05-31 DIAGNOSIS — Z87891 Personal history of nicotine dependence: Secondary | ICD-10-CM | POA: Diagnosis not present

## 2020-05-31 DIAGNOSIS — C3411 Malignant neoplasm of upper lobe, right bronchus or lung: Secondary | ICD-10-CM | POA: Diagnosis not present

## 2020-05-31 DIAGNOSIS — C3432 Malignant neoplasm of lower lobe, left bronchus or lung: Secondary | ICD-10-CM | POA: Diagnosis not present

## 2020-05-31 DIAGNOSIS — K219 Gastro-esophageal reflux disease without esophagitis: Secondary | ICD-10-CM

## 2020-05-31 DIAGNOSIS — C3431 Malignant neoplasm of lower lobe, right bronchus or lung: Secondary | ICD-10-CM | POA: Diagnosis not present

## 2020-06-02 NOTE — Telephone Encounter (Signed)
pantoprazole (PROTONIX) 40 MG tablet, refill request @  [facility] ([place] [place] - [address] Phone:  [phone]  Fax:  [phone]

## 2020-06-02 NOTE — Telephone Encounter (Signed)
Patient called to say she has transportation to p/u her Protonix this AM but won't have a ride later. Requesting this be sent as soon as possible. Hubbard Hartshorn, BSN, RN-BC

## 2020-06-06 LAB — GUARDANT 360

## 2020-06-20 ENCOUNTER — Other Ambulatory Visit: Payer: Self-pay

## 2020-06-20 ENCOUNTER — Ambulatory Visit (INDEPENDENT_AMBULATORY_CARE_PROVIDER_SITE_OTHER): Payer: Medicare Other | Admitting: Internal Medicine

## 2020-06-20 ENCOUNTER — Encounter: Payer: Medicare Other | Admitting: Internal Medicine

## 2020-06-20 DIAGNOSIS — J329 Chronic sinusitis, unspecified: Secondary | ICD-10-CM

## 2020-06-20 DIAGNOSIS — G479 Sleep disorder, unspecified: Secondary | ICD-10-CM

## 2020-06-20 DIAGNOSIS — E785 Hyperlipidemia, unspecified: Secondary | ICD-10-CM

## 2020-06-20 DIAGNOSIS — J310 Chronic rhinitis: Secondary | ICD-10-CM

## 2020-06-20 DIAGNOSIS — I10 Essential (primary) hypertension: Secondary | ICD-10-CM | POA: Diagnosis not present

## 2020-06-20 DIAGNOSIS — T7840XD Allergy, unspecified, subsequent encounter: Secondary | ICD-10-CM

## 2020-06-20 DIAGNOSIS — K219 Gastro-esophageal reflux disease without esophagitis: Secondary | ICD-10-CM

## 2020-06-20 DIAGNOSIS — M549 Dorsalgia, unspecified: Secondary | ICD-10-CM

## 2020-06-20 DIAGNOSIS — M26609 Unspecified temporomandibular joint disorder, unspecified side: Secondary | ICD-10-CM | POA: Diagnosis not present

## 2020-06-20 DIAGNOSIS — F32A Depression, unspecified: Secondary | ICD-10-CM | POA: Diagnosis not present

## 2020-06-20 NOTE — Assessment & Plan Note (Addendum)
Patient presents today for telehealth appointment.  She states that she needs refills of her amitriptyline.  I asked her why she is taking this medication.  She states that she has difficulty with insomnia particularly getting to sleep.  She denies any signs or symptoms of depression or anxiety today. I counseled her on sleep hygiene and discussed alternative medication options patient apparently had a difficulty tolerating an SSRI in the past.  Considering the medication side effects associated with amitriptyline I counseled her on discontinuing this medication.  We made a plan to titrate off her amitriptyline this week.  Considering she has been on some form of depression medicine for many years and her symptoms have been stable.  I think it is appropriate that we try a period off of medications.  I will prescribe her melatonin 1 mg nightly for help initiating sleep.   Plan: -Titrate off amitriptyline (patient counseled regarding this regimen) -Start melatonin 1 mg nightly for help initiating sleep.

## 2020-06-20 NOTE — Progress Notes (Signed)
I connected with  Kimberly [name] on 06/20/20 by telephone and verified that I am speaking with the correct person using two identifiers.   I discussed the limitations of evaluation and management by telemedicine. The patient expressed understanding and agreed to proceed.     CC: HTN and Depression - Medication management     This is a telephone encounter between [employer] and Kimberly [name] on 06/20/2020 for HTN/Depression medication management. The visit was conducted with the patient located at home and Kimberly [name] at [facility]. The patient's identity was confirmed using their DOB and current address. The patient has consented to being evaluated through a telephone encounter and understands the associated risks (an examination cannot be done and the patient may need to come in for an appointment) / benefits (allows the patient to remain at home, decreasing exposure to coronavirus). I personally spent 13 minutes on medical discussion.   HPI:    [name] is a 65 y.o. with PMH as below.   Please see A&P for assessment of the patient's acute and chronic medical conditions.   Past Medical History:  Diagnosis Date  . Alcohol abuse, in remission    since around 2005  . Alcoholic hepatitis    fatty liver on imaging- Treated Hepatits C and treated  . Allergic rhinitis   . Anemia    EGD with duodenal AVM, normal colonoscopy 04/2012  . Asthma   . Callus of foot 2009  . Chest pain, musculoskeletal 2011  . Cholelithiasis    [place] 09/2008: Cholelithiasis is present, s/p cholecystectomy  . Depression   . Excessive cerumen in ear canal    since before 2000  . GERD (gastroesophageal reflux disease)   . History of blood transfusion   . Hyperlipidemia   . Hypertension   . Menopausal syndrome    Treated with estradiol in the past - discontinued 04/2012  . MENOPAUSAL SYNDROME 09/24/2006   2008 Note : The pt is adament about having this medication.  She fully understands the increased  risk of heart disease and cancer that is associated with HRT and is willing to accept this risk in order to prevent the debilitating hot flashes she has off this medication.  Will continue to encourage her to try to wean herself off of this medication at our next visit.  2009 note indicated that she tr  . Otitis externa, acute Feb 2012   bilateral, treated with azithromycin after failure of neomycin drops  . Otitis media, acute Feb 2012  . PONV (postoperative nausea and vomiting)    Review of Systems:  Negative except what is noted on assessment and plan.    Assessment & Plan:   See Encounters Tab for problem based charting.  Patient discussed with Dr. Philipp Ovens

## 2020-06-21 ENCOUNTER — Encounter: Payer: Self-pay | Admitting: Internal Medicine

## 2020-06-21 DIAGNOSIS — G479 Sleep disorder, unspecified: Secondary | ICD-10-CM | POA: Insufficient documentation

## 2020-06-21 MED ORDER — PANTOPRAZOLE SODIUM 40 MG PO TBEC: 40.0000 mg | DELAYED_RELEASE_TABLET | Freq: Every day | ORAL | 0 refills | Status: DC

## 2020-06-21 MED ORDER — PRAVASTATIN SODIUM 40 MG PO TABS: 40.0000 mg | ORAL_TABLET | Freq: Every evening | ORAL | 0 refills | Status: DC

## 2020-06-21 MED ORDER — LOSARTAN POTASSIUM 25 MG PO TABS: 25.0000 mg | ORAL_TABLET | Freq: Every day | ORAL | 3 refills | Status: DC

## 2020-06-21 MED ORDER — MELATONIN 1 MG PO CAPS: 1.0000 mg | ORAL_CAPSULE | Freq: Every evening | ORAL | 2 refills | Status: DC

## 2020-06-21 MED ORDER — DICLOFENAC SODIUM 1 % EX GEL: CUTANEOUS | 0 refills | Status: DC

## 2020-06-21 MED ORDER — CYCLOBENZAPRINE HCL 5 MG PO TABS: 5.0000 mg | ORAL_TABLET | Freq: Every evening | ORAL | 0 refills | Status: DC | PRN

## 2020-06-21 MED ORDER — FLUTICASONE PROPIONATE 50 MCG/ACT NA SUSP: NASAL | 0 refills | Status: DC

## 2020-06-21 NOTE — Assessment & Plan Note (Addendum)
Patient has a history of hyperlipidemia on pravastatin 40 mg.  She denies any medication side effects at this time and needs refills of this prescription today.

## 2020-06-21 NOTE — Assessment & Plan Note (Signed)
Patient presents for telehealth appointment today for her hypertension.  She is on losartan 25 mg daily.  She denies any medication side effects.  She will need refills today.

## 2020-06-21 NOTE — Assessment & Plan Note (Signed)
Patient states she has a difficult time initiating sleep.  She gets somewhere between 6 to 10 hours of sleep nightly.  She does have a TV in her room and admits to watching TV while she tries to fall asleep.  I counseled her on sleep hygiene and the importance of sleeping in a cool dark room and avoiding electronics for 1 to 2 hours prior to sleeping.  She states that she takes amitriptyline to help with her "insomnia".  I counseled her on discontinuing this medication as it has significant side effects.  I will start her on melatonin 1 mg nightly.  Patient agreed to this plan.  Plan: - Start Melatonin 1 mg nightly

## 2020-06-21 NOTE — Assessment & Plan Note (Signed)
Flonase refilled today.

## 2020-06-22 ENCOUNTER — Emergency Department (HOSPITAL_COMMUNITY)
Admission: EM | Admit: 2020-06-22 | Discharge: 2020-06-22 | Disposition: A | Discharge status: Home / Self Care | Payer: Medicare Other | Attending: Emergency Medicine | Admitting: Emergency Medicine

## 2020-06-22 DIAGNOSIS — G501 Atypical facial pain: Secondary | ICD-10-CM | POA: Insufficient documentation

## 2020-06-22 DIAGNOSIS — Z5321 Procedure and treatment not carried out due to patient leaving prior to being seen by health care provider: Secondary | ICD-10-CM | POA: Insufficient documentation

## 2020-06-22 DIAGNOSIS — Z743 Need for continuous supervision: Secondary | ICD-10-CM | POA: Diagnosis not present

## 2020-06-22 DIAGNOSIS — R200 Anesthesia of skin: Secondary | ICD-10-CM | POA: Diagnosis not present

## 2020-06-22 NOTE — ED Notes (Signed)
Patient states she does not want to wait to be seen. Patient states she was seeking treatment for TMJ and will go home and use a heat pack. Patient ambulated out of department independently.

## 2020-06-22 NOTE — ED Triage Notes (Signed)
Patient BIB [facility] for facial pan x2 days. 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

## 2020-06-22 NOTE — Progress Notes (Signed)
  Radiation Oncology         (336) [phone] ________________________________  Name: Kimberly [name] MRN: [id number]  Date: 05/31/2020  DOB: 17-Oct-1955  End of Treatment Note  Diagnosis:   stage I adenocarcinoma, likely multiple metachronous tumors    Indication for treatment::  curative       Radiation treatment dates:   05/24/20 - 05/31/20  Site/dose:     1.  The patient was treated to the right lung with a course of stereotactic body radiation treatment.  The patient received 54 Gray in 3 fractions using a IMRT/SBRT technique, with 3 fields.  2.  The patient was treated to the left lung with a course of stereotactic body radiation treatment.  The patient received 54 Gray in 3 fractions using a IMRT/SBRT technique, with 3 fields.  Narrative: The patient tolerated radiation treatment relatively well.   No unexpected difficulties.  The patient's breathing did not significantly change during the course of the treatment.  Plan: The patient has completed radiation treatment. The patient will return to radiation oncology clinic for routine followup in one month. I advised the patient to call or return sooner if they have any questions or concerns related to their recovery or treatment. ________________________________  Jodelle Gross, M.D., Ph.D.

## 2020-06-24 ENCOUNTER — Telehealth: Payer: Self-pay

## 2020-06-24 NOTE — Telephone Encounter (Signed)
Received TC from patient who states the pharmacy told her she couldn't pick up the melatonin until next week, she is asking for [facility] to resend RX.  RN instructed patient to call pharmacy back and ask if medication is on  Backorder, etc. SChaplin, RN,BSN

## 2020-06-26 ENCOUNTER — Other Ambulatory Visit: Payer: Self-pay

## 2020-06-26 ENCOUNTER — Encounter (HOSPITAL_COMMUNITY): Payer: Self-pay | Admitting: Emergency Medicine

## 2020-06-26 ENCOUNTER — Emergency Department (HOSPITAL_COMMUNITY)
Admission: EM | Admit: 2020-06-26 | Discharge: 2020-06-26 | Disposition: A | Discharge status: Home / Self Care | Payer: Medicare Other | Attending: Emergency Medicine | Admitting: Emergency Medicine

## 2020-06-26 DIAGNOSIS — M79605 Pain in left leg: Secondary | ICD-10-CM | POA: Insufficient documentation

## 2020-06-26 DIAGNOSIS — M79602 Pain in left arm: Secondary | ICD-10-CM | POA: Insufficient documentation

## 2020-06-26 DIAGNOSIS — Z85118 Personal history of other malignant neoplasm of bronchus and lung: Secondary | ICD-10-CM | POA: Diagnosis not present

## 2020-06-26 DIAGNOSIS — J45909 Unspecified asthma, uncomplicated: Secondary | ICD-10-CM | POA: Diagnosis not present

## 2020-06-26 DIAGNOSIS — Z79899 Other long term (current) drug therapy: Secondary | ICD-10-CM | POA: Insufficient documentation

## 2020-06-26 DIAGNOSIS — R29810 Facial weakness: Secondary | ICD-10-CM | POA: Diagnosis not present

## 2020-06-26 DIAGNOSIS — I499 Cardiac arrhythmia, unspecified: Secondary | ICD-10-CM | POA: Diagnosis not present

## 2020-06-26 DIAGNOSIS — Z87891 Personal history of nicotine dependence: Secondary | ICD-10-CM | POA: Insufficient documentation

## 2020-06-26 DIAGNOSIS — Z923 Personal history of irradiation: Secondary | ICD-10-CM | POA: Diagnosis not present

## 2020-06-26 DIAGNOSIS — Z8616 Personal history of COVID-19: Secondary | ICD-10-CM | POA: Diagnosis not present

## 2020-06-26 DIAGNOSIS — Z743 Need for continuous supervision: Secondary | ICD-10-CM | POA: Diagnosis not present

## 2020-06-26 DIAGNOSIS — I10 Essential (primary) hypertension: Secondary | ICD-10-CM | POA: Diagnosis not present

## 2020-06-26 DIAGNOSIS — M791 Myalgia, unspecified site: Secondary | ICD-10-CM | POA: Diagnosis not present

## 2020-06-26 DIAGNOSIS — R531 Weakness: Secondary | ICD-10-CM | POA: Diagnosis not present

## 2020-06-26 DIAGNOSIS — M7918 Myalgia, other site: Secondary | ICD-10-CM

## 2020-06-26 LAB — CBC
HCT: 42.1 % (ref 36.0–46.0)
Hemoglobin: 13.3 g/dL (ref 12.0–15.0)
MCH: 30.8 pg (ref 26.0–34.0)
MCHC: 31.6 g/dL (ref 30.0–36.0)
MCV: 97.5 fL (ref 80.0–100.0)
Platelets: 249 10*3/uL (ref 150–400)
RBC: 4.32 MIL/uL (ref 3.87–5.11)
RDW: 11.5 % (ref 11.5–15.5)
WBC: 3.8 10*3/uL — ABNORMAL LOW (ref 4.0–10.5)
nRBC: 0 % (ref 0.0–0.2)

## 2020-06-26 LAB — BASIC METABOLIC PANEL
Anion gap: 9 (ref 5–15)
BUN: 6 mg/dL — ABNORMAL LOW (ref 8–23)
CO2: 24 mmol/L (ref 22–32)
Calcium: 9.1 mg/dL (ref 8.9–10.3)
Chloride: 104 mmol/L (ref 98–111)
Creatinine, Ser: 0.9 mg/dL (ref 0.44–1.00)
GFR, Estimated: >60 mL/min (ref >60)
Glucose, Bld: 116 mg/dL — ABNORMAL HIGH (ref 70–99)
Potassium: 3.7 mmol/L (ref 3.5–5.1)
Sodium: 137 mmol/L (ref 135–145)

## 2020-06-26 LAB — TROPONIN I (HIGH SENSITIVITY): Troponin I (High Sensitivity): 5 ng/L (ref <18)

## 2020-06-26 NOTE — ED Triage Notes (Signed)
Pt to triage via [facility].  Reports "burning" sensation to L side of face, L arm, and entire L side of body since 1/26.

## 2020-06-26 NOTE — Discharge Instructions (Signed)
For the pain in your left arm and leg, try taking Tylenol 650 mg every 4 hours.  Make sure that you follow-up with your oncologist, Dr. Earlie Server in March 2022 as planned for further discussion about possible treatments for your lung cancer.

## 2020-06-26 NOTE — ED Provider Notes (Signed)
[place] EMERGENCY DEPARTMENT Provider Note   CSN: [id number] Arrival date & time: 06/26/20  1001     History Chief Complaint  Patient presents with  . Arm Pain    Kimberly [name] is a 65 y.o. female.  HPI Patient presents for evaluation of a sensation of a "nagging pain in my left arm and left leg."  She thinks it might be secondary to her recent treatment with radiation for breast cancer, and sleeping on her left side.  She is not taking anything for the pain.  She states she came here to see what she could take for the discomfort.  She was evaluated by her PCP, 6 days ago for difficulty sleeping.  He recommended that she take melatonin 1 mg at bedtime.  He also recommended that she stop taking amitriptyline.  Instructed her on tapering off it.  She was saw her oncologist on 05/12/2020.  At that time it was noted that she had progressive pulmonary nodules thought to be secondary to adenocarcinoma, that were amenable to surgical resection however the patient declined that.  Current plans are to follow-up again with medical oncology, in March 2022 to check on status after radiation treatment which she has completed.    Past Medical History:  Diagnosis Date  . Alcohol abuse, in remission    since around 2005  . Alcoholic hepatitis    fatty liver on imaging- Treated Hepatits C and treated  . Allergic rhinitis   . Anemia    EGD with duodenal AVM, normal colonoscopy 04/2012  . Asthma   . Callus of foot 2009  . Chest pain, musculoskeletal 2011  . Cholelithiasis    [place] 09/2008: Cholelithiasis is present, s/p cholecystectomy  . Depression   . Excessive cerumen in ear canal    since before 2000  . GERD (gastroesophageal reflux disease)   . History of blood transfusion   . Hyperlipidemia   . Hypertension   . Menopausal syndrome    Treated with estradiol in the past - discontinued 04/2012  . MENOPAUSAL SYNDROME 09/24/2006   2008 Note : The pt is adament about  having this medication.  She fully understands the increased risk of heart disease and cancer that is associated with HRT and is willing to accept this risk in order to prevent the debilitating hot flashes she has off this medication.  Will continue to encourage her to try to wean herself off of this medication at our next visit.  2009 note indicated that she tr  . Otitis externa, acute Feb 2012   bilateral, treated with azithromycin after failure of neomycin drops  . Otitis media, acute Feb 2012  . PONV (postoperative nausea and vomiting)     Patient Active Problem List   Diagnosis Date Noted  . Sleep difficulties 06/21/2020  . Malignant neoplasm of bronchus of right upper lobe ([place]) 05/12/2020  . Mass of upper lobe of right lung 05/03/2020  . Need for immunization against influenza 02/29/2020  . Pulmonary nodule seen on imaging study 02/29/2020  . Muscle strain of right upper back 02/12/2020  . COVID-19 09/25/2019  . History of dysuria 09/24/2019  . Myalgia 09/07/2019  . Rhinosinusitis 08/27/2019  . Dysuria 05/25/2019  . Tachycardia 05/25/2019  . Hematuria 05/25/2019  . Ear pain, bilateral 04/27/2019  . 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 Vaginal discharge 01/18/2019  . Abdominal gas pain 12/16/2018  . Pain in right thigh 01/15/2018  . Health care  maintenance 03/25/2013  . Depression 12/03/2012  . Iron deficiency anemia 05/08/2012  . HLD (hyperlipidemia) 03/25/2008  . Constipation 11/04/2007  . Essential hypertension 09/24/2006  . Allergies 09/24/2006  . Asthma 09/24/2006  . GERD 09/24/2006    Past Surgical History:  Procedure Laterality Date  . ABDOMINAL HYSTERECTOMY  1980s  . BIOPSY  03/18/2019   Procedure: BIOPSY;  Surgeon: Rush Landmark Telford Nab., MD;  Location: [place];  Service: Gastroenterology;;  . BRONCHIAL BIOPSY  05/03/2020   Procedure: BRONCHIAL BIOPSIES;  Surgeon: Collene Gobble, MD;  Location: [facility] ENDOSCOPY;  Service:  Pulmonary;;  . BRONCHIAL BRUSHINGS  05/03/2020   Procedure: BRONCHIAL BRUSHINGS;  Surgeon: Collene Gobble, MD;  Location: [facility] ENDOSCOPY;  Service: Pulmonary;;  . BRONCHIAL NEEDLE ASPIRATION BIOPSY  05/03/2020   Procedure: BRONCHIAL NEEDLE ASPIRATION BIOPSIES;  Surgeon: Collene Gobble, MD;  Location: [facility] ENDOSCOPY;  Service: Pulmonary;;  . CHOLECYSTECTOMY  01/06/09  . COLONOSCOPY  05/09/2012   Procedure: COLONOSCOPY;  Surgeon: Inda Castle, MD;  Location: [place];  Service: Endoscopy;  Laterality: N/A;  . COLONOSCOPY WITH PROPOFOL N/A 03/18/2019   Procedure: COLONOSCOPY WITH PROPOFOL;  Surgeon: Rush Landmark Telford Nab., MD;  Location: [place];  Service: Gastroenterology;  Laterality: N/A;  . ENTEROSCOPY N/A 03/18/2019   Procedure: ENTEROSCOPY;  Surgeon: Rush Landmark Telford Nab., MD;  Location: [place];  Service: Gastroenterology;  Laterality: N/A;  . ESOPHAGOGASTRODUODENOSCOPY  05/09/2012   Procedure: ESOPHAGOGASTRODUODENOSCOPY (EGD);  Surgeon: Inda Castle, MD;  Location: [place];  Service: Endoscopy;  Laterality: N/A;  . FIDUCIAL MARKER PLACEMENT  05/03/2020   Procedure: FIDUCIAL MARKER PLACEMENT;  Surgeon: Collene Gobble, MD;  Location: [facility] ENDOSCOPY;  Service: Pulmonary;;  . HEMOSTASIS CLIP PLACEMENT  03/18/2019   Procedure: HEMOSTASIS CLIP PLACEMENT;  Surgeon: Irving Copas., MD;  Location: [place];  Service: Gastroenterology;;  . HOT HEMOSTASIS N/A 03/18/2019   Procedure: HOT HEMOSTASIS (ARGON PLASMA COAGULATION/BICAP);  Surgeon: Irving Copas., MD;  Location: [place];  Service: Gastroenterology;  Laterality: N/A;  . POLYPECTOMY  03/18/2019   Procedure: POLYPECTOMY;  Surgeon: Rush Landmark Telford Nab., MD;  Location: [place];  Service: Gastroenterology;;  . [place] INJECTION  03/18/2019   Procedure: SUBMUCOSAL TATTOO INJECTION;  Surgeon: Irving Copas., MD;  Location: [place];  Service: Gastroenterology;;  . VIDEO  BRONCHOSCOPY WITH ENDOBRONCHIAL NAVIGATION N/A 05/03/2020   Procedure: VIDEO BRONCHOSCOPY WITH ENDOBRONCHIAL NAVIGATION;  Surgeon: Collene Gobble, MD;  Location: [place] ENDOSCOPY;  Service: Pulmonary;  Laterality: N/A;     OB History   No obstetric history on file.     Family History  Problem Relation Age of Onset  . Hypertension Mother   . Colon cancer Neg Hx   . Esophageal cancer Neg Hx   . Inflammatory bowel disease Neg Hx   . Liver disease Neg Hx   . Pancreatic cancer Neg Hx   . Rectal cancer Neg Hx   . Stomach cancer Neg Hx     Social History   Tobacco Use  . Smoking status: Former Smoker    Types: Cigarettes    Quit date: 08/21/1995    Years since quitting: 24.8  . Smokeless tobacco: Never Used  Vaping Use  . Vaping Use: Never used  Substance Use Topics  . Alcohol use: No    Comment: in remission since 2007  . Drug use: Yes    Types: Marijuana    Home Medications Prior to Admission medications   Medication Sig Start Date End Date Taking?  Authorizing Provider  cyclobenzaprine (FLEXERIL) 5 MG tablet Take 1 tablet (5 mg total) by mouth at bedtime as needed. 06/21/20   [name], MD  diclofenac Sodium (VOLTAREN) 1 % GEL APPLY 4 GRAMS TOPICALLY 4 TIMES DAILY AS NEEDED 06/21/20   [name], MD  fluticasone (FLONASE) 50 MCG/ACT nasal spray USE 2 SPRAY(S) IN EACH NOSTRIL ONCE DAILY AS NEEDED FOR ALLERGIES AND RHINITIS. 06/21/20   [name], MD  fluticasone (FLOVENT HFA) 110 MCG/ACT inhaler Inhale 2 puffs into the lungs 2 (two) times daily. Patient taking differently: Inhale 2 puffs into the lungs 2 (two) times daily as needed (respiratory issues.). 03/14/20   Lacinda Axon, MD  losartan (COZAAR) 25 MG tablet Take 1 tablet (25 mg total) by mouth daily. 06/21/20   [name], MD  Melatonin 1 MG CAPS Take 1 capsule (1 mg total) by mouth at bedtime. 06/21/20 09/19/20  [name], MD  pantoprazole (PROTONIX) 40 MG tablet Take 1 tablet (40 mg total) by mouth  daily. 06/21/20   [name], MD  Phenol-Glycerin (CHLORASEPTIC MAX SORE THROAT) 1.5-33 % LIQD Use as directed 2 sprays in the mouth or throat 4 (four) times daily as needed. Patient not taking: Reported on 05/12/2020 11/20/19   Molli Hazard A, DO  pravastatin (PRAVACHOL) 40 MG tablet Take 1 tablet (40 mg total) by mouth every evening. 06/21/20   [name], MD  sucralfate (CARAFATE) 1 GM/10ML suspension Take 10 mLs (1 g total) by mouth 4 (four) times daily -  with meals and at bedtime. Patient not taking: No sig reported 02/29/20   Mosetta Anis, MD    Allergies    Banana, Grape (artificial) flavor, Ibuprofen, and Penicillins  Review of Systems   Review of Systems  All other systems reviewed and are negative.   Physical Exam Updated Vital Signs BP (!) 139/94   Pulse 97   Temp 98.5 F (36.9 C) (Oral)   Resp 16   Ht 5\' 4"  (1.626 m)   Wt 49.9 kg   LMP 08/21/1982   SpO2 100%   BMI 18.88 kg/m   Physical Exam Vitals and nursing note reviewed.  Constitutional:      General: She is not in acute distress.    Appearance: She is well-developed and well-nourished. She is not ill-appearing, toxic-appearing or diaphoretic.  HENT:     Head: Normocephalic and atraumatic.     Right Ear: External ear normal.     Left Ear: External ear normal.  Eyes:     Extraocular Movements: EOM normal.     Conjunctiva/sclera: Conjunctivae normal.     Pupils: Pupils are equal, round, and reactive to light.  Neck:     Trachea: Phonation normal.  Cardiovascular:     Rate and Rhythm: Normal rate.  Pulmonary:     Effort: Pulmonary effort is normal. No respiratory distress.     Breath sounds: No stridor.  Chest:     Chest wall: No bony tenderness.  Abdominal:     General: There is no distension.     Palpations: Abdomen is soft.  Musculoskeletal:        General: No swelling or tenderness. Normal range of motion.     Cervical back: Normal range of motion and neck supple.  Skin:    General:  Skin is warm, dry and intact.  Neurological:     Mental Status: She is alert and oriented to person, place, and time.     Cranial Nerves: No cranial nerve  deficit.     Sensory: No sensory deficit.     Motor: No abnormal muscle tone.     Coordination: Coordination normal.  Psychiatric:        Mood and Affect: Mood and affect and mood normal.        Behavior: Behavior normal.        Thought Content: Thought content normal.        Judgment: Judgment normal.     ED Results / Procedures / Treatments   Labs (all labs ordered are listed, but only abnormal results are displayed) Labs Reviewed  BASIC METABOLIC PANEL - Abnormal; Notable for the following components:      Result Value   Glucose, Bld 116 (*)    BUN 6 (*)    All other components within normal limits  CBC - Abnormal; Notable for the following components:   WBC 3.8 (*)    All other components within normal limits  TROPONIN I (HIGH SENSITIVITY)  TROPONIN I (HIGH SENSITIVITY)    EKG EKG Interpretation  Date/Time:  Sunday June 26 2020 10:06:22 EST Ventricular Rate:  99 PR Interval:  126 QRS Duration: 90 QT Interval:  354 QTC Calculation: 454 R Axis:   63 Text Interpretation: Normal sinus rhythm with sinus arrhythmia ST & T wave abnormality, consider inferior ischemia Abnormal ECG Since last tracing st/TWA are new Confirmed by Daleen Bo [id number] on 06/26/2020 1:57:40 PM   Radiology No results found.  Procedures Procedures   Medications Ordered in ED Medications - No data to display  ED Course  I have reviewed the triage vital signs and the nursing notes.  Pertinent labs & imaging results that were available during my care of the patient were reviewed by me and considered in my medical decision making (see chart for details).    MDM Rules/Calculators/A&P                           Patient Vitals for the past 24 hrs:  BP Temp Temp src Pulse Resp SpO2 Height Weight  06/26/20 1325 (!) 139/94 98.5 F  (36.9 C) Oral 97 16 100 % -- --  06/26/20 1003 138/79 97.9 F (36.6 C) Oral (!) 108 20 100 % 5\' 4"  (1.626 m) 49.9 kg    3:21 PM Reevaluation with update and discussion. After initial assessment and treatment, an updated evaluation reveals no change in clinical status, findings discussed with patient all questions answered. Daleen Bo   Medical Decision Making:  This patient is presenting for evaluation of abdominal pain, without trauma, ongoing for several weeks, which does not require a range of treatment options, and is not a complaint that involves a high risk of morbidity and mortality. The differential diagnoses include musculoskeletal pain, progression of cancer. I decided to review old records, and in summary elderly female presenting without trauma, with nonspecific left arm and leg pain.  She is being followed and managed by oncology for lung cancer, and recently had radiation therapy completed.  She has planned follow-up with oncology in March 2022..  I did not require additional historical information from anyone.      Critical Interventions-clinical evaluation, discussion with patient  After These Interventions, the Patient was reevaluated and was found stable for discharge.  Patient with nonspecific pain, without trauma, being followed closely by oncology.  No indication for further ED evaluation.  Recommend symptomatic treatment to use for pain control  CRITICAL CARE-no Performed by: Daleen Bo  Nursing Notes Reviewed/ Care Coordinated Applicable Imaging Reviewed Interpretation of Laboratory Data incorporated into ED treatment  The patient appears reasonably screened and/or stabilized for discharge and I doubt any other medical condition or other [facility] requiring further screening, evaluation, or treatment in the ED at this time prior to discharge.  Plan: Home Medications-continue usual medicine and use Tylenol for pain; Home Treatments-regular activity; return here if  the recommended treatment, does not improve the symptoms; Recommended follow up-PCP, as needed.  Oncology as scheduled.     Final Clinical Impression(s) / ED Diagnoses Final diagnoses:  None    Rx / DC Orders ED Discharge Orders    None       Daleen Bo, MD 06/26/20 1524

## 2020-06-27 ENCOUNTER — Ambulatory Visit: Payer: Medicare Other | Admitting: Radiation Oncology

## 2020-06-27 ENCOUNTER — Telehealth: Payer: Self-pay | Admitting: Internal Medicine

## 2020-06-27 ENCOUNTER — Ambulatory Visit
Admission: RE | Admit: 2020-06-27 | Discharge: 2020-06-27 | Disposition: A | Discharge status: Home / Self Care | Payer: Medicare Other | Source: Ambulatory Visit | Attending: Radiation Oncology | Admitting: Radiation Oncology

## 2020-06-27 ENCOUNTER — Telehealth: Payer: Self-pay | Admitting: Medical Oncology

## 2020-06-27 DIAGNOSIS — C3411 Malignant neoplasm of upper lobe, right bronchus or lung: Secondary | ICD-10-CM

## 2020-06-27 NOTE — Telephone Encounter (Signed)
TC to patient: Patient answered phone and states EMS is in her house.  Patient c/o ongoing pain in her face and left arm, which patient is describing as "nerve pain".  Patient is stating she thinks she has shingles virus because she heard it will cause nerve pain.  Patient hands over the phone to [facility] EMS employee who states patient called them d/t face and arm pain, she has no rash and refuses to go to ED.  Patient states she was just in the ED yesterday for this and was told to take tylenol and f/u with her doctor.   Appt made for tomorrow 06/28/20 @1015 , Dr. Bridgett Larsson (no red team availability).  Pt informed if pain worsens or she develops chest pain or SOB to present to ED, she verbalized understanding and currently denies these symptoms. SChaplin, RN,BSN

## 2020-06-27 NOTE — Progress Notes (Signed)
Internal Medicine Clinic Attending  Case discussed with Dr. Coe  At the time of the visit.  We reviewed the resident's history and exam and pertinent patient test results.  I agree with the assessment, diagnosis, and plan of care documented in the resident's note.  

## 2020-06-27 NOTE — Progress Notes (Signed)
  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 The patient was treated to the right lung with a course of stereotactic body radiation treatment.  The patient received 54 Gray in 3 fractions using a IMRT/SBRT technique, with 3 fields.  2.  The patient was treated to the left lung with a course of stereotactic body radiation treatment.  The patient received 54 Gray in 3 fractions using a IMRT/SBRT technique, with 3 fields.   Narrative:  The patient was contacted today for routine follow-up. During treatment she did very well with radiotherapy and did not have significant desquamation. She reports she has been doing well without shortness of breath. She has complained of some pain in her arms and legs after laying on the table for radiation.   Impression/Plan: 1. 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  The patient has been doing well since completion of radiotherapy. We discussed that we would be happy to continue to follow her as needed, but she will also continue to follow up with Dr. Julien Nordmann in medical oncology.  2. Leg and arm discomfort. She believes this was from her positioning during treatment but is going to use OTC medications. If her symptoms progress she should follow up with her PCP.      Carola Rhine, PAC

## 2020-06-27 NOTE — Telephone Encounter (Signed)
Pt reporting Pressure under her Left Eye running down to her Left arm after ED visit on Yesterday.  Pt requesting a call back.

## 2020-06-27 NOTE — Progress Notes (Incomplete)
   CC: ***  HPI:  [name] is a 65 y.o. female with history as below presenting for ***. Please refer to problem based charting for further details of assessment and plan of current problem and chronic medical conditions.  Face and arm pain Patient was seen in the ED on 1/30 for pain in the left arm and leg. She is currently receiving radiation therapy for lung cancer, ***also breast cancer per ED provider. She was discharged with Tylenol for pain. Has follow up with oncology in March. Note that amitriptyline was discontinued on 1/24 which she was reportedly taking for sleep. Her depression was reportedly controlled and previously had poor reaction to SSRIs, so the plan was to trial her off of antidepressants.   She has nodules in both left and right lungs, but most worrisome are 2 spiculated nodules in R upper lobe which had elevated SUV on PET scan on April 06 2020, biopsies suspicious for multifocal adenocarcinoma. Patietn declined surgery and is interested in radiation therapy. To see Mr. Mohamed again in March for repeat CT scan.   Past Medical History:  Diagnosis Date  . Alcohol abuse, in remission    since around 2005  . Alcoholic hepatitis    fatty liver on imaging- Treated Hepatits C and treated  . Allergic rhinitis   . Anemia    EGD with duodenal AVM, normal colonoscopy 04/2012  . Asthma   . Callus of foot 2009  . Chest pain, musculoskeletal 2011  . Cholelithiasis    [place] 09/2008: Cholelithiasis is present, s/p cholecystectomy  . Depression   . Excessive cerumen in ear canal    since before 2000  . GERD (gastroesophageal reflux disease)   . History of blood transfusion   . Hyperlipidemia   . Hypertension   . Menopausal syndrome    Treated with estradiol in the past - discontinued 04/2012  . MENOPAUSAL SYNDROME 09/24/2006   2008 Note : The pt is adament about having this medication.  She fully understands the increased risk of heart disease and cancer that is  associated with HRT and is willing to accept this risk in order to prevent the debilitating hot flashes she has off this medication.  Will continue to encourage her to try to wean herself off of this medication at our next visit.  2009 note indicated that she tr  . Otitis externa, acute Feb 2012   bilateral, treated with azithromycin after failure of neomycin drops  . Otitis media, acute Feb 2012  . PONV (postoperative nausea and vomiting)    Review of Systems:   ROS ***  Physical Exam: There were no vitals filed for this visit. Constitutional: no acute distress Head: atraumatic ENT: external ears normal Cardiovascular: regular rate and rhythm, normal heart sounds Pulmonary: effort normal, normal breath sounds bilaterally Abdominal: flat, nontender, no rebound tenderness, bowel sounds normal Musculoskeletal: *** Skin: warm and dry Neurological: alert, no focal deficit Psychiatric: normal mood and affect  Assessment & Plan:   See Encounters Tab for problem based charting.  Patient {GC/GE:[id number]::"discussed with","seen with"} Dr. {NAMES:[id number]::"Butcher","Guilloud","Hoffman","Mullen","Narendra","Raines","Vincent"}

## 2020-06-27 NOTE — Telephone Encounter (Addendum)
"  I do not like the way this makes me feel, I do not like it at all". She describes  tingling  /burning from left side of face down  arm  and leg. She denies rash or lesions. Her speech is clear , she sounds anxious, she is alone.  At first she said she said she does not  want to go back to the ED.  She repeatedly said " I don't like the way I feel right now".  I told her to call 911 and she said she is calling.

## 2020-06-28 ENCOUNTER — Encounter: Payer: Medicare Other | Admitting: Student

## 2020-06-29 ENCOUNTER — Telehealth: Payer: Self-pay

## 2020-06-29 NOTE — Telephone Encounter (Signed)
Pt called complaining of aching and some burning on her left side. She says she is also having some dizziness and wants to know if she can be seen/evaluated in the [facility]. I have spoken with Marita Kansas, RN who advised pt can come tomorrow 06/29/20 at 9:30am. Pt has been advised of this and expressed understanding of this information and a schedule message has been sent.

## 2020-06-30 ENCOUNTER — Other Ambulatory Visit: Payer: Self-pay

## 2020-06-30 ENCOUNTER — Inpatient Hospital Stay: Payer: Medicare Other | Attending: Internal Medicine | Admitting: Medical

## 2020-06-30 VITALS — BP 143/83 | HR 102 | Temp 97.9°F | Resp 16 | Ht 64.0 in | Wt 104.0 lb

## 2020-06-30 DIAGNOSIS — M26609 Unspecified temporomandibular joint disorder, unspecified side: Secondary | ICD-10-CM

## 2020-06-30 DIAGNOSIS — M62838 Other muscle spasm: Secondary | ICD-10-CM

## 2020-06-30 MED ORDER — CYCLOBENZAPRINE HCL 5 MG PO TABS: 5.0000 mg | ORAL_TABLET | Freq: Every evening | ORAL | 0 refills | Status: DC | PRN

## 2020-07-01 NOTE — Progress Notes (Signed)
Ms. Tegtmeyer is a 65 year old female who has a history of a malignant neoplasm of the right upper lobe bronchus.  She is followed by Dr. Fanny Bien. Mohamed and presents to the clinic today reporting that she has a shooting sensation down her entire left side at times.  She cannot elucidate further on the quality and precipitating or alleviating factors associated with this "sensation".  She does have a history of back spasms on her right back and has a prescription for Flexeril.  She was concerned that she could have shingles although a brief exam today showed no evidence of a rash.  She requests that we give her "shot" for her symptoms today.  She was given a refill of Flexeril and was told to follow-up as needed.  Sandi Mealy, MHS, PA-C Physician Assistant

## 2020-07-06 ENCOUNTER — Other Ambulatory Visit: Payer: Self-pay | Admitting: Internal Medicine

## 2020-07-06 DIAGNOSIS — K219 Gastro-esophageal reflux disease without esophagitis: Secondary | ICD-10-CM

## 2020-07-11 ENCOUNTER — Other Ambulatory Visit: Payer: Self-pay

## 2020-07-11 ENCOUNTER — Encounter: Payer: Self-pay | Admitting: Internal Medicine

## 2020-07-11 ENCOUNTER — Telehealth: Payer: Self-pay

## 2020-07-11 ENCOUNTER — Ambulatory Visit (INDEPENDENT_AMBULATORY_CARE_PROVIDER_SITE_OTHER): Payer: Medicare Other | Admitting: Internal Medicine

## 2020-07-11 ENCOUNTER — Telehealth: Payer: Self-pay | Admitting: Medical Oncology

## 2020-07-11 VITALS — BP 117/94 | HR 99 | Temp 98.3°F | Ht 64.0 in | Wt 105.0 lb

## 2020-07-11 DIAGNOSIS — Z299 Encounter for prophylactic measures, unspecified: Secondary | ICD-10-CM

## 2020-07-11 DIAGNOSIS — Z0000 Encounter for general adult medical examination without abnormal findings: Secondary | ICD-10-CM

## 2020-07-11 DIAGNOSIS — M549 Dorsalgia, unspecified: Secondary | ICD-10-CM

## 2020-07-11 DIAGNOSIS — S29011A Strain of muscle and tendon of front wall of thorax, initial encounter: Secondary | ICD-10-CM

## 2020-07-11 DIAGNOSIS — R0789 Other chest pain: Secondary | ICD-10-CM | POA: Diagnosis not present

## 2020-07-11 MED ORDER — DICLOFENAC SODIUM 1 % EX GEL: CUTANEOUS | 0 refills | Status: DC

## 2020-07-11 NOTE — Patient Instructions (Addendum)
Thank you for allowing [place] to provide your care today.  For your chest wall pain, the physical exam is reassuring and your pain is likely due to muscle strain. For that, I prescribe Voltaren gel to be applied up to 4 times a day for pain. If no improvement in 2 weeks, give [place] a call and I will send a referral to physical therapy.   Please come back to clinic in 1 month to follow up with your PCP or earlier if your symptoms get worse or not improved. As always, if having severe symptoms, please seek medical attention at emergency room. Should you have any questions or concerns please call the internal medicine clinic at [phone].    Thank you!

## 2020-07-11 NOTE — Telephone Encounter (Signed)
Pls contact pt regarding left arm pain [phone]

## 2020-07-11 NOTE — Progress Notes (Signed)
Acute Office Visit  Subjective:    Patient ID: Kimberly [name], female    DOB: Nov 20, 1955, 65 y.o.   MRN: [id number]  Chief Complaint  Patient presents with  . Arm Pain    LEFT UNDERARM PAIN  - 10  SINCE JAN.4.2022 MEDICATION ON ALL MED    HPI Patient is in today for pain left side of chest wall next to proximal of her left arm. Please refer to problem based charting for further details and assessment and plan of current problem and chronic medical conditions.  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   Medications: Flexeril, Voltaren gel, flonase, losartan, Protonix, Pravastatin, Sucralfate  Past Medical History:  Diagnosis Date  . Alcohol abuse, in remission    since around 2005  . Alcoholic hepatitis    fatty liver on imaging- Treated Hepatits C and treated  . Allergic rhinitis   . Anemia    EGD with duodenal AVM, normal colonoscopy 04/2012  . Asthma   . Callus of foot 2009  . Chest pain, musculoskeletal 2011  . Cholelithiasis    [place] 09/2008: Cholelithiasis is present, s/p cholecystectomy  . Depression   . Excessive cerumen in ear canal    since before 2000  . GERD (gastroesophageal reflux disease)   . History of blood transfusion   . Hyperlipidemia   . Hypertension   . Menopausal syndrome    Treated with estradiol in the past - discontinued 04/2012  . MENOPAUSAL SYNDROME 09/24/2006   2008 Note : The pt is adament about having this medication.  She fully understands the increased risk of heart disease and cancer that is associated with HRT and is willing to accept this risk in order to prevent the debilitating hot flashes she has off this medication.  Will continue to encourage her to try to wean herself off of this medication at our next visit.  2009 note indicated that she tr  . Otitis externa, acute Feb 2012    bilateral, treated with azithromycin after failure of neomycin drops  . Otitis media, acute Feb 2012  . PONV (postoperative nausea and vomiting)     Past Surgical History:  Procedure Laterality Date  . ABDOMINAL HYSTERECTOMY  1980s  . BIOPSY  03/18/2019   Procedure: BIOPSY;  Surgeon: Meridee Score Netty Starring., MD;  Location: [facility] ENDOSCOPY;  Service: Gastroenterology;;  . BRONCHIAL BIOPSY  05/03/2020   Procedure: BRONCHIAL BIOPSIES;  Surgeon: Leslye Peer, MD;  Location: [facility] ENDOSCOPY;  Service: Pulmonary;;  . BRONCHIAL BRUSHINGS  05/03/2020   Procedure: BRONCHIAL BRUSHINGS;  Surgeon: Leslye Peer, MD;  Location: [facility] ENDOSCOPY;  Service: Pulmonary;;  . BRONCHIAL NEEDLE ASPIRATION BIOPSY  05/03/2020   Procedure: BRONCHIAL NEEDLE ASPIRATION BIOPSIES;  Surgeon: Leslye Peer, MD;  Location: [facility] ENDOSCOPY;  Service: Pulmonary;;  . CHOLECYSTECTOMY  01/06/09  . COLONOSCOPY  05/09/2012   Procedure: COLONOSCOPY;  Surgeon: Louis Meckel, MD;  Location: [facility] ENDOSCOPY;  Service: Endoscopy;  Laterality: N/A;  . COLONOSCOPY WITH PROPOFOL N/A 03/18/2019   Procedure: COLONOSCOPY WITH PROPOFOL;  Surgeon: Meridee Score Netty Starring., MD;  Location: [facility] ENDOSCOPY;  Service: Gastroenterology;  Laterality: N/A;  . ENTEROSCOPY N/A 03/18/2019   Procedure: ENTEROSCOPY;  Surgeon: Meridee Score Netty Starring., MD;  Location: [facility] ENDOSCOPY;  Service: Gastroenterology;  Laterality: N/A;  . ESOPHAGOGASTRODUODENOSCOPY  05/09/2012  Procedure: ESOPHAGOGASTRODUODENOSCOPY (EGD);  Surgeon: Louis Meckel, MD;  Location: [facility] ENDOSCOPY;  Service: Endoscopy;  Laterality: N/A;  . FIDUCIAL MARKER PLACEMENT  05/03/2020   Procedure: FIDUCIAL MARKER PLACEMENT;  Surgeon: Leslye Peer, MD;  Location: [facility] ENDOSCOPY;  Service: Pulmonary;;  . HEMOSTASIS CLIP PLACEMENT  03/18/2019   Procedure: HEMOSTASIS CLIP PLACEMENT;  Surgeon: Lemar Lofty., MD;  Location: [facility] ENDOSCOPY;  Service: Gastroenterology;;  . HOT HEMOSTASIS N/A  03/18/2019   Procedure: HOT HEMOSTASIS (ARGON PLASMA COAGULATION/BICAP);  Surgeon: Lemar Lofty., MD;  Location: [facility] ENDOSCOPY;  Service: Gastroenterology;  Laterality: N/A;  . POLYPECTOMY  03/18/2019   Procedure: POLYPECTOMY;  Surgeon: Meridee Score Netty Starring., MD;  Location: [facility] ENDOSCOPY;  Service: Gastroenterology;;  . Sunnie Nielsen TATTOO INJECTION  03/18/2019   Procedure: SUBMUCOSAL TATTOO INJECTION;  Surgeon: Lemar Lofty., MD;  Location: [facility] ENDOSCOPY;  Service: Gastroenterology;;  . VIDEO BRONCHOSCOPY WITH ENDOBRONCHIAL NAVIGATION N/A 05/03/2020   Procedure: VIDEO BRONCHOSCOPY WITH ENDOBRONCHIAL NAVIGATION;  Surgeon: Leslye Peer, MD;  Location: [facility] ENDOSCOPY;  Service: Pulmonary;  Laterality: N/A;    Family History  Problem Relation Age of Onset  . Hypertension Mother   . Colon cancer Neg Hx   . Esophageal cancer Neg Hx   . Inflammatory bowel disease Neg Hx   . Liver disease Neg Hx   . Pancreatic cancer Neg Hx   . Rectal cancer Neg Hx   . Stomach cancer Neg Hx     Social History   Socioeconomic History  . Marital status: Single    Spouse name: Not on file  . Number of children: Not on file  . Years of education: Not on file  . Highest education level: Not on file  Occupational History  . Not on file  Tobacco Use  . Smoking status: Former Smoker    Types: Cigarettes    Quit date: 08/21/1995    Years since quitting: 24.9  . Smokeless tobacco: Never Used  Vaping Use  . Vaping Use: Never used  Substance and Sexual Activity  . Alcohol use: No    Comment: in remission since 2007  . Drug use: Yes    Types: Marijuana  . Sexual activity: Never  Other Topics Concern  . Not on file  Social History Narrative   2   Social Determinants of Health   Financial Resource Strain: Not on file  Food Insecurity: Not on file  Transportation Needs: Not on file  Physical Activity: Not on file  Stress: Not on file  Social Connections: Not on file  Intimate  Partner Violence: Not on file    Outpatient Medications Prior to Visit  Medication Sig Dispense Refill  . cyclobenzaprine (FLEXERIL) 5 MG tablet Take 1 tablet (5 mg total) by mouth at bedtime as needed. 30 tablet 0  . fluticasone (FLONASE) 50 MCG/ACT nasal spray USE 2 SPRAY(S) IN EACH NOSTRIL ONCE DAILY AS NEEDED FOR ALLERGIES AND RHINITIS. 16 g 0  . fluticasone (FLOVENT HFA) 110 MCG/ACT inhaler Inhale 2 puffs into the lungs 2 (two) times daily. (Patient taking differently: Inhale 2 puffs into the lungs 2 (two) times daily as needed (respiratory issues.).) 36 g 1  . losartan (COZAAR) 25 MG tablet Take 1 tablet (25 mg total) by mouth daily. 90 tablet 3  . Melatonin 1 MG CAPS Take 1 capsule (1 mg total) by mouth at bedtime. 30 capsule 2  . pantoprazole (PROTONIX) 40 MG tablet Take 1 tablet by mouth once daily 30 tablet 0  .  Phenol-Glycerin (CHLORASEPTIC MAX SORE THROAT) 1.5-33 % LIQD Use as directed 2 sprays in the mouth or throat 4 (four) times daily as needed. (Patient not taking: Reported on 05/12/2020) 30 mL 0  . pravastatin (PRAVACHOL) 40 MG tablet Take 1 tablet (40 mg total) by mouth every evening. 90 tablet 0  . sucralfate (CARAFATE) 1 GM/10ML suspension Take 10 mLs (1 g total) by mouth 4 (four) times daily -  with meals and at bedtime. (Patient not taking: No sig reported) 420 mL 0  . diclofenac Sodium (VOLTAREN) 1 % GEL APPLY 4 GRAMS TOPICALLY 4 TIMES DAILY AS NEEDED 200 g 0   No facility-administered medications prior to visit.    Allergies  Allergen Reactions  . Banana     Lip swelling  . Grape (Artificial) Flavor Swelling    Allergic to grapes  . Ibuprofen Rash  . Penicillins Rash    Did it involve swelling of the face/tongue/throat, SOB, or low BP? No Did it involve sudden or severe rash/hives, skin peeling, or any reaction on the inside of your mouth or nose? No Did you need to seek medical attention at a hospital or doctor's office? No When did it last happen?more  than 10 years If all above answers are "NO", may proceed with cephalosporin use.     Review of Systems     Objective:     BP (!) 117/94 (BP Location: Right Arm, Patient Position: Sitting, Cuff Size: Normal)   Pulse 99   Temp 98.3 F (36.8 C) (Oral)   Ht 5\' 4"  (1.626 m)   Wt 105 lb (47.6 kg)   LMP 08/21/1982   SpO2 96%   BMI 18.02 kg/m  Wt Readings from Last 3 Encounters:  07/11/20 105 lb (47.6 kg)  06/30/20 104 lb (47.2 kg)  06/26/20 110 lb (49.9 kg)   Physical Exam Constitutional:      Comments: Thin appearing  Cardiovascular:     Rate and Rhythm: Normal rate and regular rhythm.     Pulses: Normal pulses.     Heart sounds: Normal heart sounds. No murmur heard.  Pulmonary:     Effort: Pulmonary effort is normal.     Breath sounds: Normal breath sounds.  Abdominal:     Palpations: Abdomen is soft.     Tenderness: There is no abdominal tenderness.  Musculoskeletal:        General: Tenderness present at left upper lateral chest wall muscle. No erythema or swelling. Neurological:     Mental Status: She is alert.  Psychiatric:        Mood and Affect: Mood normal.        Behavior: Behavior normal.        Thought Content: Thought content normal.        Judgment: Judgment normal.   Health Maintenance Due  Topic Date Due  . LIPID PANEL  01/16/2019  . DEXA SCAN  Never done  . PNA vac Low Risk Adult (1 of 2 - PCV13) Never done  . TETANUS/TDAP  07/14/2019  . COVID-19 Vaccine (3 - Pfizer risk 4-dose series) 04/25/2020    There are no preventive care reminders to display for this patient.   Lab Results  Component Value Date   TSH 2.090 09/24/2019   Lab Results  Component Value Date   WBC 3.8 (L) 06/26/2020   HGB 13.3 06/26/2020   HCT 42.1 06/26/2020   MCV 97.5 06/26/2020   PLT 249 06/26/2020   Lab Results  Component Value  Date   NA 137 06/26/2020   K 3.7 06/26/2020   CO2 24 06/26/2020   GLUCOSE 116 (H) 06/26/2020   BUN 6 (L) 06/26/2020   CREATININE  0.90 06/26/2020   BILITOT 0.8 05/12/2020   ALKPHOS 82 05/12/2020   AST 21 05/12/2020   ALT 16 05/12/2020   PROT 7.5 05/12/2020   ALBUMIN 4.1 05/12/2020   CALCIUM 9.1 06/26/2020   ANIONGAP 9 06/26/2020   GFR 90.72 04/20/2020   Lab Results  Component Value Date   CHOL 244 (H) 01/15/2018   Lab Results  Component Value Date   HDL 50 01/15/2018   Lab Results  Component Value Date   LDLCALC 172 (H) 01/15/2018   Lab Results  Component Value Date   TRIG 112 01/15/2018   Lab Results  Component Value Date   CHOLHDL 4.9 (H) 01/15/2018   No results found for: HGBA1C     Assessment & Plan:   Problem List Items Addressed This Visit      Musculoskeletal and Integument   Chest wall muscle strain - Primary   Relevant Medications   diclofenac Sodium (VOLTAREN) 1 % GEL   Other Relevant Orders   Ambulatory referral to Physical Therapy     Other   Health care maintenance    -Ordering screening bone density DEXA  -Screening MM already ordered       Relevant Orders   DG Bone Density   Left-sided chest wall pain    Patient reports pain at left side of her chest wall next to proximal of her left arm. This started after her last radiation therapy session on January and after she had to stretch her arm to get the right position for radiation therapy of her chest/lung for 30 minutes. She has had pain in the area particularly when she moves her arm. She was seen in oncology clinic for same issue on 2/3 and was given Flexeril with only mild impovement. On exam, she has tenderness to palpation around origin of pectoralis muscle to the chest wall. Streching her arm reproduce the pain. No swelling or erythema. Her symptoms is likely due to muscle strain. Explained that it may take time to completely get better. Recommended heating pad, Voltaren gel and if no improvement, will refer to PT.  -Sending refill for Voltaren gel ADDNEDUM: Patient calls the office next day, stated that Voltaren gel  did not help and she would like to be referred to PT now. Appropriate to refer to PT for further  -Ambulatory referral to PT         Other Visit Diagnoses    Preventive measure           Meds ordered this encounter  Medications  . diclofenac Sodium (VOLTAREN) 1 % GEL    Sig: APPLY 4 GRAMS TOPICALLY 4 TIMES DAILY AS NEEDED    Dispense:  200 g    Refill:  0     Monae Topping, MD

## 2020-07-11 NOTE — Telephone Encounter (Signed)
Kimberly [name] is  concerned about the left facial and arm tingling she is experiencing . Symptoms  started after her last xrt. She asked if the arm position in radiation can cause any symptoms like this. I told her probably not.   She is seeing PCP today for same.

## 2020-07-11 NOTE — Telephone Encounter (Signed)
RTC, pt states she wants to see a doctor and get an xray for her arm.  She states "getting radiation messed up my arm and I need an xray".  RN informed patient she already had an appt scheduled with an MD today at 2:15 and MD will determine if any images are needed after evaluation.  She states she will be here. SChaplin, RN,BSN

## 2020-07-11 NOTE — Telephone Encounter (Signed)
Hi. Thank you. I saw the patient and addressed that. Did not need X Ray. Thanks

## 2020-07-12 ENCOUNTER — Telehealth: Payer: Self-pay

## 2020-07-12 DIAGNOSIS — Z299 Encounter for prophylactic measures, unspecified: Secondary | ICD-10-CM | POA: Insufficient documentation

## 2020-07-12 DIAGNOSIS — R0789 Other chest pain: Secondary | ICD-10-CM | POA: Insufficient documentation

## 2020-07-12 DIAGNOSIS — S29011A Strain of muscle and tendon of front wall of thorax, initial encounter: Secondary | ICD-10-CM | POA: Insufficient documentation

## 2020-07-12 NOTE — Telephone Encounter (Signed)
Return pt's call - she stated the AVS states right side pain but it's her left side. And she wants the doctor to go ahead and place a referral to PT; I asked if she has been using voltaren gel, stated she has and it's not working. And stated her height/weight are incorrect on the AVS. Thanks

## 2020-07-12 NOTE — Telephone Encounter (Signed)
Hi thank you for your message. I don't see I mention " the side of pain in AVS but it might be from visit diagnose and I change it now. I will let our RN know about weight and heigh. I will go ahead and refer her to PT, per her request. Thanks

## 2020-07-12 NOTE — Telephone Encounter (Signed)
Requesting to speak with Dr. Myrtie Hawk regarding AVS. Please call pt back.

## 2020-07-13 NOTE — Assessment & Plan Note (Signed)
Patient reports pain at left side of her chest wall next to proximal of her left arm. This started after her last radiation therapy session on January and after she had to stretch her arm to get the right position for radiation therapy of her chest/lung for 30 minutes. She has had pain in the area particularly when she moves her arm. She was seen in oncology clinic for same issue on 2/3 and was given Flexeril with only mild impovement. On exam, she has tenderness to palpation around origin of pectoralis muscle to the chest wall. Streching her arm reproduce the pain. No swelling or erythema. Her symptoms is likely due to muscle strain. Explained that it may take time to completely get better. Recommended heating pad, Voltaren gel and if no improvement, will refer to PT.  -Sending refill for Voltaren gel ADDNEDUM: Patient calls the office next day, stated that Voltaren gel did not help and she would like to be referred to PT now. Appropriate to refer to PT for further  -Ambulatory referral to PT

## 2020-07-13 NOTE — Assessment & Plan Note (Signed)
-  Ordering screening bone density DEXA  -Screening MM already ordered

## 2020-07-14 NOTE — Progress Notes (Signed)
Internal Medicine Clinic Attending  Case discussed with Dr. Masoudi  At the time of the visit.  We reviewed the resident's history and exam and pertinent patient test results.  I agree with the assessment, diagnosis, and plan of care documented in the resident's note.  

## 2020-07-20 ENCOUNTER — Encounter: Payer: Self-pay | Admitting: Student

## 2020-07-20 ENCOUNTER — Ambulatory Visit (INDEPENDENT_AMBULATORY_CARE_PROVIDER_SITE_OTHER): Payer: Medicare Other | Admitting: Student

## 2020-07-20 ENCOUNTER — Other Ambulatory Visit: Payer: Self-pay

## 2020-07-20 VITALS — BP 180/95 | HR 92 | Temp 97.9°F | Ht 64.0 in | Wt 106.2 lb

## 2020-07-20 DIAGNOSIS — G479 Sleep disorder, unspecified: Secondary | ICD-10-CM

## 2020-07-20 DIAGNOSIS — Z0189 Encounter for other specified special examinations: Secondary | ICD-10-CM | POA: Diagnosis not present

## 2020-07-20 DIAGNOSIS — S29011A Strain of muscle and tendon of front wall of thorax, initial encounter: Secondary | ICD-10-CM | POA: Diagnosis not present

## 2020-07-20 DIAGNOSIS — M1990 Unspecified osteoarthritis, unspecified site: Secondary | ICD-10-CM

## 2020-07-20 DIAGNOSIS — M25562 Pain in left knee: Secondary | ICD-10-CM | POA: Diagnosis not present

## 2020-07-20 DIAGNOSIS — M79651 Pain in right thigh: Secondary | ICD-10-CM | POA: Diagnosis not present

## 2020-07-20 DIAGNOSIS — H9203 Otalgia, bilateral: Secondary | ICD-10-CM | POA: Diagnosis not present

## 2020-07-20 MED ORDER — DICLOFENAC SODIUM 1 % EX GEL
CUTANEOUS | 0 refills | Status: DC
Start: 2020-07-20 — End: 2020-08-11

## 2020-07-20 MED ORDER — RAMELTEON 8 MG PO TABS: 8.0000 mg | ORAL_TABLET | Freq: Every day | ORAL | 1 refills | Status: DC

## 2020-07-20 NOTE — Progress Notes (Signed)
CC: ear pain and DME rolling walker need  HPI:  [name] is a 65 y.o. female with history listed below presenting to the [facility] for ear pain and need for home rolling walker. Please see individualized problem based charting for full HPI.  Past Medical History:  Diagnosis Date  . Alcohol abuse, in remission    since around 2005  . Alcoholic hepatitis    fatty liver on imaging- Treated Hepatits C and treated  . Allergic rhinitis   . Anemia    EGD with duodenal AVM, normal colonoscopy 04/2012  . Asthma   . Callus of foot 2009  . Chest pain, musculoskeletal 2011  . Cholelithiasis    [place] 09/2008: Cholelithiasis is present, s/p cholecystectomy  . Depression   . Excessive cerumen in ear canal    since before 2000  . GERD (gastroesophageal reflux disease)   . History of blood transfusion   . Hyperlipidemia   . Hypertension   . Menopausal syndrome    Treated with estradiol in the past - discontinued 04/2012  . MENOPAUSAL SYNDROME 09/24/2006   2008 Note : The pt is adament about having this medication.  She fully understands the increased risk of heart disease and cancer that is associated with HRT and is willing to accept this risk in order to prevent the debilitating hot flashes she has off this medication.  Will continue to encourage her to try to wean herself off of this medication at our next visit.  2009 note indicated that she tr  . Otitis externa, acute Feb 2012   bilateral, treated with azithromycin after failure of neomycin drops  . Otitis media, acute Feb 2012  . PONV (postoperative nausea and vomiting)     Review of Systems:  Negative aside from that listed in individualized problem based charting.  Physical Exam:  Vitals:   07/20/20 0833 07/20/20 0840  BP: (!) 182/93 (!) 180/95  Pulse: 89 92  Temp: 97.9 F (36.6 C)   TempSrc: Oral   SpO2: 100%   Weight: 106 lb 3.2 oz (48.2 kg)   Height: 5\' 4"  (1.626 m)    Physical Exam Constitutional:      Appearance:  Normal appearance. She is not ill-appearing.  HENT:     Head: Normocephalic and atraumatic.     Right Ear: Tympanic membrane, ear canal and external ear normal.     Left Ear: Tympanic membrane, ear canal and external ear normal.     Ears:     Comments: Pain with pressing tragus and mastoid bilaterally. Pain and popping sound at TMJ (R>L) when opening mouth or clamping teeth down. No erythema or swelling noted in either external ear canal or tympanic membrane.    Nose: Nose normal. No congestion.     Mouth/Throat:     Mouth: Mucous membranes are moist.     Pharynx: Oropharynx is clear. No oropharyngeal exudate.  Eyes:     Extraocular Movements: Extraocular movements intact.     Conjunctiva/sclera: Conjunctivae normal.     Pupils: Pupils are equal, round, and reactive to light.  Cardiovascular:     Rate and Rhythm: Normal rate and regular rhythm.     Pulses: Normal pulses.     Heart sounds: Normal heart sounds. No murmur heard. No friction rub. No gallop.   Pulmonary:     Effort: Pulmonary effort is normal. No respiratory distress.     Breath sounds: No wheezing.  Abdominal:     General: Bowel sounds  are normal. There is no distension.     Palpations: Abdomen is soft.     Tenderness: There is no abdominal tenderness.  Musculoskeletal:        General: No swelling. Normal range of motion.     Cervical back: Normal range of motion. No rigidity.  Skin:    General: Skin is warm and dry.  Neurological:     General: No focal deficit present.     Mental Status: She is alert and oriented to person, place, and time.  Psychiatric:        Mood and Affect: Mood normal.        Behavior: Behavior normal.      Assessment & Plan:   See Encounters Tab for problem based charting.  Patient discussed with Dr. Jimmye Norman

## 2020-07-20 NOTE — Assessment & Plan Note (Signed)
Patient with chronic bilateral ear pain with several visits for this in the past. 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 She was referred to a TMJ specialist at that time but did not follow through as it was uncovered by her insurance. 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   During my encounter, she is very frustrated as she still has no solution to resolving her symptoms. I discussed that her symptoms may be from her dentures grinding against each other when she sleeps as she does not take them out at night. Advised removing dentures at nighttime to prevent bruxism. I also advised her to increase her daily tylenol use to BID dosing to see if this helps control her pain a bit better. Lastly, I prescribed topical voltaren gel to apply around TMJ joint to see if this may help with the pain she is experiencing. She agrees with plan.   Plan: -remove dentures at bedtime -topical voltaren gel -tylenol 650mg  BID

## 2020-07-20 NOTE — Patient Instructions (Signed)
Kimberly [name],  It was a pleasure seeing you in the clinic today.   We discussed the following today:  1. I have sent prescriptions for the sleep medicine and for the topical voltaren gel.  2. I have placed a referral for physical therapy to evaluate you for a rolling walker. Someone will call you to set this up.  Please call our clinic at [phone] if you have any questions or concerns. The best time to call is Monday-Friday from 9am-4pm, but there is someone available 24/7 at the same number. If you need medication refills, please notify your pharmacy one week in advance and they will send [place] a request.   Thank you for letting us take part in your care. We look forward to seeing you next time!

## 2020-07-20 NOTE — Assessment & Plan Note (Signed)
Patient continues to have difficulties with sleep initiation at night. She reports that none of the OTC sleep aids have helped her thus far. Discussed using ramelteon 8mg  qhs prn to help with sleep initiation. She agrees with plan.  Plan: -ramelteon 8mg  qhs prn

## 2020-07-20 NOTE — Assessment & Plan Note (Signed)
Patient requesting home rolling walker to help with ambulation. She reports that her mobility is significantly limited due to bilateral knee pain, likely from underlying osteoarthritis. This has really been impairing her from performing her ADLs, primarily getting to the bathroom on time and going to get groceries. She reports that she uses a cane but feels like she is not as stable on it as she would be if she had a walker. I do believe that this patient would benefit from a seated rolling walker and that it would enable her to perform her usual ADLs again. I will place a referral to physical therapy to evaluate her for need for rolling walker. She agrees with this.  Plan: -PT evaluation for need for rolling walker

## 2020-07-24 NOTE — Progress Notes (Signed)
Internal Medicine Clinic Attending  Case discussed with Dr. Allyson Sabal at the time of the visit.  We reviewed the resident's history and exam and pertinent patient test results.  I agree with the assessment, diagnosis, and plan of care documented in the resident's note.

## 2020-07-25 ENCOUNTER — Telehealth: Payer: Self-pay | Admitting: Medical Oncology

## 2020-07-25 ENCOUNTER — Other Ambulatory Visit: Payer: Self-pay | Admitting: Internal Medicine

## 2020-07-25 DIAGNOSIS — Z1231 Encounter for screening mammogram for malignant neoplasm of breast: Secondary | ICD-10-CM

## 2020-07-25 NOTE — Telephone Encounter (Signed)
Reviewed schedule -  I gave her number to call scheduling for CT scan appt.

## 2020-07-26 ENCOUNTER — Telehealth: Payer: Self-pay

## 2020-07-26 DIAGNOSIS — H2513 Age-related nuclear cataract, bilateral: Secondary | ICD-10-CM | POA: Diagnosis not present

## 2020-07-26 NOTE — Telephone Encounter (Signed)
Return pt's call -states her neck/gland is swollen and needs an abx. States it hurts to eat/swallow. States "the doctor told me to rub cream (?) on it but it 's not helping; I needs an antibiotic".

## 2020-07-26 NOTE — Telephone Encounter (Signed)
Requesting antibiotic for swollen gland, pt do not want an appt. Please call pt back.

## 2020-07-26 NOTE — Telephone Encounter (Signed)
According to Epic, pt has an appt scheduled for tomorrow.

## 2020-07-27 ENCOUNTER — Other Ambulatory Visit: Payer: Self-pay

## 2020-07-27 ENCOUNTER — Encounter: Payer: Self-pay | Admitting: Internal Medicine

## 2020-07-27 ENCOUNTER — Ambulatory Visit (INDEPENDENT_AMBULATORY_CARE_PROVIDER_SITE_OTHER): Payer: Medicare Other | Admitting: Internal Medicine

## 2020-07-27 VITALS — BP 157/87 | HR 86 | Temp 98.4°F | Wt 103.1 lb

## 2020-07-27 DIAGNOSIS — M542 Cervicalgia: Secondary | ICD-10-CM | POA: Insufficient documentation

## 2020-07-27 DIAGNOSIS — Z23 Encounter for immunization: Secondary | ICD-10-CM | POA: Diagnosis not present

## 2020-07-27 DIAGNOSIS — I10 Essential (primary) hypertension: Secondary | ICD-10-CM | POA: Diagnosis not present

## 2020-07-27 DIAGNOSIS — M62838 Other muscle spasm: Secondary | ICD-10-CM

## 2020-07-27 MED ORDER — CYCLOBENZAPRINE HCL 5 MG PO TABS: 5.0000 mg | ORAL_TABLET | Freq: Every evening | ORAL | 0 refills | Status: DC | PRN

## 2020-07-27 MED ORDER — AMLODIPINE BESYLATE 5 MG PO TABS: 5.0000 mg | ORAL_TABLET | Freq: Every day | ORAL | 11 refills | Status: DC

## 2020-07-27 NOTE — Progress Notes (Signed)
   CC: right neck pain  HPI:  Kimberly [name] is a 65 y.o. with PMH as below.   Please see A&P for assessment of the patient's acute and chronic medical conditions.   Intermittent right neck pain that has been worse the last few days. The pain is worse with turning her head to the left. No cough, dysphagia, headaches,dizziness, jaw pain, pain with chewing, sore throat, changes in vision. She does endorse occasional ear pain. The pain does not radiate anywear. She has stopped sleeping with her dentures in as requested.   Past Medical History:  Diagnosis Date  . Alcohol abuse, in remission    since around 2005  . Alcoholic hepatitis    fatty liver on imaging- Treated Hepatits C and treated  . Allergic rhinitis   . Anemia    EGD with duodenal AVM, normal colonoscopy 04/2012  . Asthma   . Callus of foot 2009  . Chest pain, musculoskeletal 2011  . Cholelithiasis    [place] 09/2008: Cholelithiasis is present, s/p cholecystectomy  . Depression   . Excessive cerumen in ear canal    since before 2000  . GERD (gastroesophageal reflux disease)   . History of blood transfusion   . Hyperlipidemia   . Hypertension   . Menopausal syndrome    Treated with estradiol in the past - discontinued 04/2012  . MENOPAUSAL SYNDROME 09/24/2006   2008 Note : The pt is adament about having this medication.  She fully understands the increased risk of heart disease and cancer that is associated with HRT and is willing to accept this risk in order to prevent the debilitating hot flashes she has off this medication.  Will continue to encourage her to try to wean herself off of this medication at our next visit.  2009 note indicated that she tr  . Otitis externa, acute Feb 2012   bilateral, treated with azithromycin after failure of neomycin drops  . Otitis media, acute Feb 2012  . PONV (postoperative nausea and vomiting)    Review of Systems:   10 point ROS negative except as noted in HPI  Physical  Exam:   Constitution: NAD, appears stated age HENT: TTP over right SCM and occipital muscles, no cervical lymphadenopathy or thyroid swelling, right TM without swelling or erythema, left TM not visible due to ear wax, no popping of jaw with opening, masseter and temporalis NTTP.  Eyes: no icterus or injection, PERRLA Respiratory: CTA, no w/r/r MSK: moving all extremities Neuro: normal affect, a&ox3, CN II-XII intact  Skin: c/d/i    Vitals:   07/27/20 0832  BP: (!) 157/87  Pulse: 86  Temp: 98.4 F (36.9 C)  TempSrc: Oral  SpO2: 99%  Weight: 103 lb 1.6 oz (46.8 kg)    Assessment & Plan:   See Encounters Tab for problem based charting.  Patient discussed with Dr. Jimmye Norman

## 2020-07-27 NOTE — Assessment & Plan Note (Addendum)
Intermittent right neck pain that has been worse the last few days. The pain is worse with turning her head to the left. No cough, dysphagia, headaches,dizziness, jaw pain, pain with chewing, sore throat, changes in vision. She does endorse occasional ear pain. The pain does not radiate anywear. She has stopped sleeping with her dentures in as requested.  She is TTP over right SCM and right occipital muscles. TM intact, no swelling or erythema. No symptoms of radiculopathy. 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 No neurological deficits or symptoms concerning for metastasis with recent adenocarcinoma diagnosis. Most recently had CT head 03/2020. She is receiving radiation although this is stereotactic.  Symptoms most consistent with mild strain, likely secondary to posture although her TMJ may be playing a role as well. See TMJ for further details on this.   - alternate ice, heat  - cont. voltaren gel, tylenol, flexeril prn, which she has not yet tried for her neck pain.  - will f/u with PT this month

## 2020-07-27 NOTE — Patient Instructions (Addendum)
Thank you for allowing [place] to provide your care today. Today we discussed your blood pressure and neck pain   I have ordered the following labs for you:   Please alternate ice and heat on your neck.  Use voltaren gel as needed for neck pain  Please follow-up with physical therapy.   Today we made the following changes to your medications:   Please START taking  norvasc 5 mg once per day   Please continue taking losartan   Please follow-up in two weeks for blood pressure check    Please call the internal medicine center clinic if you have any questions or concerns, we may be able to help and keep you from a long and expensive emergency room wait. Our clinic and after hours phone number is [phone], the best time to call is Monday through Friday 9 am to 4 pm but there is always someone available 24/7 if you have an emergency. If you need medication refills please notify your pharmacy one week in advance and they will send [place] a request.

## 2020-07-27 NOTE — Assessment & Plan Note (Signed)
BP Readings from Last 3 Encounters:  07/27/20 (!) 157/87  07/20/20 (!) 180/95  07/11/20 (!) 117/94   Blood pressure elevated last several visits. Recently stopped HCTZ in the ER due to hypokalemia.   - cont. Losartan 25 mg  - start norvasc - f/u in two weeks for blood pressure check

## 2020-07-29 ENCOUNTER — Encounter (HOSPITAL_COMMUNITY): Payer: Self-pay | Admitting: Pharmacy Technician

## 2020-07-29 ENCOUNTER — Telehealth: Payer: Self-pay | Admitting: Internal Medicine

## 2020-07-29 ENCOUNTER — Other Ambulatory Visit: Payer: Self-pay

## 2020-07-29 ENCOUNTER — Emergency Department (HOSPITAL_COMMUNITY)
Admission: EM | Admit: 2020-07-29 | Discharge: 2020-07-29 | Disposition: A | Discharge status: Home / Self Care | Payer: Medicare Other | Attending: Emergency Medicine | Admitting: Emergency Medicine

## 2020-07-29 DIAGNOSIS — Z87891 Personal history of nicotine dependence: Secondary | ICD-10-CM | POA: Insufficient documentation

## 2020-07-29 DIAGNOSIS — J45909 Unspecified asthma, uncomplicated: Secondary | ICD-10-CM | POA: Insufficient documentation

## 2020-07-29 DIAGNOSIS — I10 Essential (primary) hypertension: Secondary | ICD-10-CM | POA: Diagnosis not present

## 2020-07-29 DIAGNOSIS — Z8616 Personal history of COVID-19: Secondary | ICD-10-CM | POA: Insufficient documentation

## 2020-07-29 DIAGNOSIS — Z79899 Other long term (current) drug therapy: Secondary | ICD-10-CM | POA: Diagnosis not present

## 2020-07-29 DIAGNOSIS — R030 Elevated blood-pressure reading, without diagnosis of hypertension: Secondary | ICD-10-CM | POA: Diagnosis present

## 2020-07-29 NOTE — ED Notes (Signed)
PT Left without nurse discussing discharge information. No discharge vitals or assessment performed.

## 2020-07-29 NOTE — ED Provider Notes (Signed)
[facility] EMERGENCY DEPARTMENT Provider Note   CSN: [id number] Arrival date & time: 07/29/20  1637     History Chief Complaint  Patient presents with  . Hypertension    Kimberly [name] is a 65 y.o. female.  Patient with a history of longstanding hypertension.  Patient was on hydrochlorothiazide for a long period of time.  Patient seen in the emergency department January 30 potassium was a little on the low side at that time so the hydrochlorothiazide was stopped.  She had a visit with the internal medicine clinic here at [facility] who she is followed by.  She saw Dr. Sharon Seller on March 2 and they started her on Norvasc 5 mg once a day.  Had her continue her losartan.  Patient is taking both of those.  Patient was concerned because she was getting high blood pressure readings.  Some of that may have been because it was first thing in the morning.  But blood pressure here more recently was 145/76.  Patient is having no symptoms of chest pain shortness of breath headache or any strokelike symptoms.  Internal medicine clinic was planning to see her back around March 16 for reevaluation to see how the changes in medication was helping her blood pressure.        Past Medical History:  Diagnosis Date  . Alcohol abuse, in remission    since around 2005  . Alcoholic hepatitis    fatty liver on imaging- Treated Hepatits C and treated  . Allergic rhinitis   . Anemia    EGD with duodenal AVM, normal colonoscopy 04/2012  . Asthma   . Callus of foot 2009  . Chest pain, musculoskeletal 2011  . Cholelithiasis    [place] 09/2008: Cholelithiasis is present, s/p cholecystectomy  . Depression   . Excessive cerumen in ear canal    since before 2000  . GERD (gastroesophageal reflux disease)   . History of blood transfusion   . Hyperlipidemia   . Hypertension   . Menopausal syndrome    Treated with estradiol in the past - discontinued 04/2012  . MENOPAUSAL SYNDROME 09/24/2006    2008 Note : The pt is adament about having this medication.  She fully understands the increased risk of heart disease and cancer that is associated with HRT and is willing to accept this risk in order to prevent the debilitating hot flashes she has off this medication.  Will continue to encourage her to try to wean herself off of this medication at our next visit.  2009 note indicated that she tr  . Otitis externa, acute Feb 2012   bilateral, treated with azithromycin after failure of neomycin drops  . Otitis media, acute Feb 2012  . PONV (postoperative nausea and vomiting)     Patient Active Problem List   Diagnosis Date Noted  . Cervical muscle pain 07/27/2020  . Physical therapy evaluation, initial 07/20/2020  . Left-sided chest wall pain 07/12/2020  . Chest wall muscle strain 07/12/2020  . Sleep difficulties 06/21/2020  . 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 Mass of upper lobe of right lung 05/03/2020  . Need for immunization against influenza 02/29/2020  . Pulmonary nodule seen on imaging study 02/29/2020  . Muscle strain of right upper back 02/12/2020  . COVID-19 09/25/2019  . History of dysuria 09/24/2019  . Myalgia 09/07/2019  . Rhinosinusitis 08/27/2019  . Dysuria 05/25/2019  . Tachycardia 05/25/2019  . Hematuria 05/25/2019  .  Ear pain, bilateral 04/27/2019  . 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 Vaginal discharge 01/18/2019  . Abdominal gas pain 12/16/2018  . Pain in right thigh 01/15/2018  . Health care maintenance 03/25/2013  . Depression 12/03/2012  . Iron deficiency anemia 05/08/2012  . HLD (hyperlipidemia) 03/25/2008  . Constipation 11/04/2007  . Essential hypertension 09/24/2006  . Allergies 09/24/2006  . Asthma 09/24/2006  . GERD 09/24/2006    Past Surgical History:  Procedure Laterality Date  . ABDOMINAL HYSTERECTOMY  1980s  . BIOPSY  03/18/2019   Procedure: BIOPSY;  Surgeon:  Rush Landmark Telford Nab., MD;  Location: [facility];  Service: Gastroenterology;;  . BRONCHIAL BIOPSY  05/03/2020   Procedure: BRONCHIAL BIOPSIES;  Surgeon: Collene Gobble, MD;  Location: [facility] ENDOSCOPY;  Service: Pulmonary;;  . BRONCHIAL BRUSHINGS  05/03/2020   Procedure: BRONCHIAL BRUSHINGS;  Surgeon: Collene Gobble, MD;  Location: [facility] ENDOSCOPY;  Service: Pulmonary;;  . BRONCHIAL NEEDLE ASPIRATION BIOPSY  05/03/2020   Procedure: BRONCHIAL NEEDLE ASPIRATION BIOPSIES;  Surgeon: Collene Gobble, MD;  Location: [facility] ENDOSCOPY;  Service: Pulmonary;;  . CHOLECYSTECTOMY  01/06/09  . COLONOSCOPY  05/09/2012   Procedure: COLONOSCOPY;  Surgeon: Inda Castle, MD;  Location: [facility];  Service: Endoscopy;  Laterality: N/A;  . COLONOSCOPY WITH PROPOFOL N/A 03/18/2019   Procedure: COLONOSCOPY WITH PROPOFOL;  Surgeon: Rush Landmark Telford Nab., MD;  Location: [place];  Service: Gastroenterology;  Laterality: N/A;  . ENTEROSCOPY N/A 03/18/2019   Procedure: ENTEROSCOPY;  Surgeon: Rush Landmark Telford Nab., MD;  Location: [place];  Service: Gastroenterology;  Laterality: N/A;  . ESOPHAGOGASTRODUODENOSCOPY  05/09/2012   Procedure: ESOPHAGOGASTRODUODENOSCOPY (EGD);  Surgeon: Inda Castle, MD;  Location: [facility];  Service: Endoscopy;  Laterality: N/A;  . FIDUCIAL MARKER PLACEMENT  05/03/2020   Procedure: FIDUCIAL MARKER PLACEMENT;  Surgeon: Collene Gobble, MD;  Location: [facility] ENDOSCOPY;  Service: Pulmonary;;  . HEMOSTASIS CLIP PLACEMENT  03/18/2019   Procedure: HEMOSTASIS CLIP PLACEMENT;  Surgeon: Irving Copas., MD;  Location: [place];  Service: Gastroenterology;;  . HOT HEMOSTASIS N/A 03/18/2019   Procedure: HOT HEMOSTASIS (ARGON PLASMA COAGULATION/BICAP);  Surgeon: Irving Copas., MD;  Location: [facility];  Service: Gastroenterology;  Laterality: N/A;  . POLYPECTOMY  03/18/2019   Procedure: POLYPECTOMY;  Surgeon: Rush Landmark Telford Nab., MD;  Location: [facility];   Service: Gastroenterology;;  . [place] INJECTION  03/18/2019   Procedure: SUBMUCOSAL TATTOO INJECTION;  Surgeon: Irving Copas., MD;  Location: [place];  Service: Gastroenterology;;  . VIDEO BRONCHOSCOPY WITH ENDOBRONCHIAL NAVIGATION N/A 05/03/2020   Procedure: VIDEO BRONCHOSCOPY WITH ENDOBRONCHIAL NAVIGATION;  Surgeon: Collene Gobble, MD;  Location: [facility] ENDOSCOPY;  Service: Pulmonary;  Laterality: N/A;     OB History   No obstetric history on file.     Family History  Problem Relation Age of Onset  . Hypertension Mother   . Colon cancer Neg Hx   . Esophageal cancer Neg Hx   . Inflammatory bowel disease Neg Hx   . Liver disease Neg Hx   . Pancreatic cancer Neg Hx   . Rectal cancer Neg Hx   . Stomach cancer Neg Hx     Social History   Tobacco Use  . Smoking status: Former Smoker    Types: Cigarettes    Quit date: 08/21/1995    Years since quitting: 24.9  . Smokeless tobacco: Never Used  Vaping Use  . Vaping Use: Never used  Substance Use Topics  . Alcohol use: No  Comment: in remission since 2007  . Drug use: Yes    Types: Marijuana    Home Medications Prior to Admission medications   Medication Sig Start Date End Date Taking? Authorizing Provider  amLODipine (NORVASC) 5 MG tablet Take 1 tablet (5 mg total) by mouth daily. 07/27/20 07/27/21  Seawell, Jaimie A, DO  cyclobenzaprine (FLEXERIL) 5 MG tablet Take 1 tablet (5 mg total) by mouth at bedtime as needed. 07/27/20   Seawell, Jaimie A, DO  diclofenac Sodium (VOLTAREN) 1 % GEL APPLY 4 GRAMS TOPICALLY 4 TIMES DAILY AS NEEDED 07/20/20   Virl Axe, MD  fluticasone (FLONASE) 50 MCG/ACT nasal spray USE 2 SPRAY(S) IN EACH NOSTRIL ONCE DAILY AS NEEDED FOR ALLERGIES AND RHINITIS. 06/21/20   [name], MD  fluticasone (FLOVENT HFA) 110 MCG/ACT inhaler Inhale 2 puffs into the lungs 2 (two) times daily. Patient taking differently: Inhale 2 puffs into the lungs 2 (two) times daily as needed (respiratory  issues.). 03/14/20   Lacinda Axon, MD  losartan (COZAAR) 25 MG tablet Take 1 tablet (25 mg total) by mouth daily. 06/21/20   [name], MD  Melatonin 1 MG CAPS Take 1 capsule (1 mg total) by mouth at bedtime. 06/21/20 09/19/20  [name], MD  pantoprazole (PROTONIX) 40 MG tablet Take 1 tablet by mouth once daily 07/06/20   Virl Axe, MD  Phenol-Glycerin (CHLORASEPTIC MAX SORE THROAT) 1.5-33 % LIQD Use as directed 2 sprays in the mouth or throat 4 (four) times daily as needed. Patient not taking: Reported on 05/12/2020 11/20/19   Molli Hazard A, DO  pravastatin (PRAVACHOL) 40 MG tablet Take 1 tablet (40 mg total) by mouth every evening. 06/21/20   [name], MD  ramelteon (ROZEREM) 8 MG tablet Take 1 tablet (8 mg total) by mouth at bedtime. 07/20/20   Virl Axe, MD  sucralfate (CARAFATE) 1 GM/10ML suspension Take 10 mLs (1 g total) by mouth 4 (four) times daily -  with meals and at bedtime. Patient not taking: No sig reported 02/29/20   Mosetta Anis, MD    Allergies    Banana, Grape (artificial) flavor, Ibuprofen, and Penicillins  Review of Systems   Review of Systems  Constitutional: Negative for chills and fever.  HENT: Negative for congestion, rhinorrhea and sore throat.   Eyes: Negative for visual disturbance.  Respiratory: Negative for cough and shortness of breath.   Cardiovascular: Negative for chest pain and leg swelling.  Gastrointestinal: Negative for abdominal pain, diarrhea, nausea and vomiting.  Genitourinary: Negative for dysuria.  Musculoskeletal: Negative for back pain and neck pain.  Skin: Negative for rash.  Neurological: Negative for dizziness, light-headedness and headaches.  Hematological: Does not bruise/bleed easily.  Psychiatric/Behavioral: Negative for confusion.    Physical Exam Updated Vital Signs BP (!) 145/96 (BP Location: Left Arm)   Pulse (!) 101   Temp 98.9 F (37.2 C) (Oral)   Resp 16   LMP 08/21/1982   SpO2 100%    Physical Exam Vitals and nursing note reviewed.  Constitutional:      General: She is not in acute distress.    Appearance: Normal appearance. She is well-developed and well-nourished.  HENT:     Head: Normocephalic and atraumatic.  Eyes:     Extraocular Movements: Extraocular movements intact.     Conjunctiva/sclera: Conjunctivae normal.     Pupils: Pupils are equal, round, and reactive to light.  Cardiovascular:     Rate and Rhythm: Normal rate and regular rhythm.  Heart sounds: No murmur heard.   Pulmonary:     Effort: Pulmonary effort is normal. No respiratory distress.     Breath sounds: Normal breath sounds.  Abdominal:     Palpations: Abdomen is soft.     Tenderness: There is no abdominal tenderness.  Musculoskeletal:        General: No edema. Normal range of motion.     Cervical back: Normal range of motion and neck supple.  Skin:    General: Skin is warm and dry.  Neurological:     General: No focal deficit present.     Mental Status: She is alert and oriented to person, place, and time.     Cranial Nerves: No cranial nerve deficit.     Sensory: No sensory deficit.  Psychiatric:        Mood and Affect: Mood and affect normal.     ED Results / Procedures / Treatments   Labs (all labs ordered are listed, but only abnormal results are displayed) Labs Reviewed - No data to display  EKG None  Radiology No results found.  Procedures Procedures   Medications Ordered in ED Medications - No data to display  ED Course  I have reviewed the triage vital signs and the nursing notes.  Pertinent labs & imaging results that were available during my care of the patient were reviewed by me and considered in my medical decision making (see chart for details).    MDM Rules/Calculators/A&P                         Blood pressure slowly improved throughout her stay here today.  When she first checked in about 840 blood pressure was 180/95.  Then at about 832 was  157/87.  Patient had taken her morning medications and then here at 1700 blood pressure was 145/96.  Patient again asymptomatic.  Patient just had an adjustment in her blood pressure medicines on March 2.  Has follow-up with internal medicine clinic to reevaluate blood pressure on March 16.  I feel that patient needs to continue the blood pressure medicine as prescribed and then follow-up with her primary care doctors to see if any additional adjustments are needed.  Final Clinical Impression(s) / ED Diagnoses Final diagnoses:  Primary hypertension    Rx / DC Orders ED Discharge Orders    None       Fredia Sorrow, MD 07/29/20 2211

## 2020-07-29 NOTE — Telephone Encounter (Signed)
   Reason for call:   I received a call from [name] at 4:20 PM indicating that she was concerned about her high blood pressure and headache.   Pertinent Data:   65 year old female with a history of hypertension who had been seen on 3/2 for elevated blood pressure readings, she had been started on Norvasc and continued on her losartan and recommended to follow-up in 2 weeks.  Patient was asking if she could come into the clinic to be evaluated, informed her that the clinic is not open on Friday afternoons.  She stated that she was in the ED and was going to be evaluated.  Before I was able to ask more questions patient stated that she was being called by someone and needed to go.   Assessment / Plan / Recommendations:   Patient will be evaluated in the ED.    [name], MD   07/29/2020, 5:49 PM

## 2020-07-29 NOTE — Discharge Instructions (Signed)
Follow-up with your doctor in the [facility] internal medicine clinic around March 16 as they planned.  Continue take your current blood pressure medicines.  Return for headache chest pain trouble breathing or any strokelike symptoms.  Will take about a week or 2 for your new blood pressure medicine to settle into see what the trend is on how well it is controlling your blood pressure.  Your internal medicine doctor may need to make some adjustments.

## 2020-07-29 NOTE — ED Triage Notes (Signed)
Pt here with hypertension. States recent changes made to her antihypertensives. Pt in NAD. Denies chest pain/shob.

## 2020-08-02 ENCOUNTER — Telehealth: Payer: Self-pay

## 2020-08-02 NOTE — Telephone Encounter (Signed)
Pls contact pt [phone]

## 2020-08-02 NOTE — Telephone Encounter (Signed)
Return pt's call - states she needs a muscle relaxant for muscle spasms. Inform pt Flexeril 5 mg is ordered; she states they do not help, she told the doctor before,  even when she takes 2 tabs.Sens rx to [place]. Thanks

## 2020-08-03 ENCOUNTER — Telehealth: Payer: Self-pay

## 2020-08-03 MED ORDER — BACLOFEN 5 MG PO TABS: 5.0000 mg | ORAL_TABLET | Freq: Three times a day (TID) | ORAL | 0 refills | Status: DC | PRN

## 2020-08-03 NOTE — Telephone Encounter (Signed)
Requesting to speak with the nurse regarding meds. Please call pt back.

## 2020-08-03 NOTE — Telephone Encounter (Signed)
Pt stated she needs a call back about cyclobenzaprine (FLEXERIL) 5 MG tablet pt stated that it is not working

## 2020-08-03 NOTE — Telephone Encounter (Signed)
Waiting for MD's to advise.  There is already an open encounter related to this medication request.  Closing this encounter. SChaplin, RN,BSN

## 2020-08-03 NOTE — Telephone Encounter (Signed)
Resending to MD's.  Please advise. Thank you, SChaplin, RN,BSN

## 2020-08-03 NOTE — Telephone Encounter (Signed)
Pls contact pt [phone] regarding medicine

## 2020-08-05 ENCOUNTER — Telehealth: Payer: Self-pay

## 2020-08-05 NOTE — Telephone Encounter (Signed)
Pt is requesting a call back about her blood pressure medicine

## 2020-08-05 NOTE — Telephone Encounter (Signed)
RTC, patient asking how long it will take a new blood pressure medication to start working. RN informed patient it takes about 2 weeks, asked pt if she was taking her b/p at home and getting high readings, patient states "no, I just want to know how long it takes for a new b/p medicine to work because my medicine was changed at my last office visit".  RN informed patient she has a 2 week f/u scheduled on 08/10/20 with Dr. Sharon Seller for a b/p check and to keep taking medication as directed.  She verbalized understanding. SChaplin, RN,BSN

## 2020-08-08 ENCOUNTER — Ambulatory Visit: Payer: Medicare Other | Attending: Internal Medicine

## 2020-08-08 ENCOUNTER — Other Ambulatory Visit: Payer: Self-pay

## 2020-08-08 ENCOUNTER — Other Ambulatory Visit: Payer: Self-pay | Admitting: Internal Medicine

## 2020-08-08 VITALS — BP 121/92

## 2020-08-08 DIAGNOSIS — J310 Chronic rhinitis: Secondary | ICD-10-CM

## 2020-08-08 DIAGNOSIS — G8929 Other chronic pain: Secondary | ICD-10-CM | POA: Diagnosis not present

## 2020-08-08 DIAGNOSIS — T7840XD Allergy, unspecified, subsequent encounter: Secondary | ICD-10-CM

## 2020-08-08 DIAGNOSIS — M25512 Pain in left shoulder: Secondary | ICD-10-CM | POA: Diagnosis not present

## 2020-08-08 DIAGNOSIS — M6281 Muscle weakness (generalized): Secondary | ICD-10-CM | POA: Insufficient documentation

## 2020-08-08 DIAGNOSIS — R293 Abnormal posture: Secondary | ICD-10-CM | POA: Diagnosis not present

## 2020-08-08 DIAGNOSIS — M25562 Pain in left knee: Secondary | ICD-10-CM | POA: Diagnosis not present

## 2020-08-08 DIAGNOSIS — J329 Chronic sinusitis, unspecified: Secondary | ICD-10-CM

## 2020-08-08 NOTE — Therapy (Signed)
[place], [place], [address] Phone: [phone]   Fax:  [phone]  Physical Therapy Evaluation  Patient Details  Name: Kimberly [name] MRN: [id number] Date of Birth: 07-10-1955 Referring Provider (PT): Brooke Dare, Elaina Pattee, MD (for knee pain))   Encounter Date: 08/08/2020   PT End of Session - 08/08/20 0809    Visit Number 1    Number of Visits 9    Date for PT Re-Evaluation 10/08/20    Authorization Type U[employer] MCR    Authorization Time Period FOTO visit 6 and 10; Progress note visit 10; Kx visit 15    PT Start Time 0820    PT Stop Time 0912    PT Time Calculation (min) 52 min    Equipment Utilized During Treatment Other (comment)   SPC   Activity Tolerance Patient tolerated treatment well    Behavior During Therapy [facility] for tasks assessed/performed           Past Medical History:  Diagnosis Date  . Alcohol abuse, in remission    since around 2005  . Alcoholic hepatitis    fatty liver on imaging- Treated Hepatits C and treated  . Allergic rhinitis   . Anemia    EGD with duodenal AVM, normal colonoscopy 04/2012  . Asthma   . Callus of foot 2009  . Chest pain, musculoskeletal 2011  . Cholelithiasis    [place] 09/2008: Cholelithiasis is present, s/p cholecystectomy  . Depression   . Excessive cerumen in ear canal    since before 2000  . GERD (gastroesophageal reflux disease)   . History of blood transfusion   . Hyperlipidemia   . Hypertension   . Menopausal syndrome    Treated with estradiol in the past - discontinued 04/2012  . MENOPAUSAL SYNDROME 09/24/2006   2008 Note : The pt is adament about having this medication.  She fully understands the increased risk of heart disease and cancer that is associated with HRT and is willing to accept this risk in order to prevent the debilitating hot flashes she has off this medication.  Will continue to encourage her to try to wean herself  off of this medication at our next visit.  2009 note indicated that she tr  . Otitis externa, acute Feb 2012   bilateral, treated with azithromycin after failure of neomycin drops  . Otitis media, acute Feb 2012  . PONV (postoperative nausea and vomiting)     Past Surgical History:  Procedure Laterality Date  . ABDOMINAL HYSTERECTOMY  1980s  . BIOPSY  03/18/2019   Procedure: BIOPSY;  Surgeon: Rush Landmark Telford Nab., MD;  Location: [place];  Service: Gastroenterology;;  . BRONCHIAL BIOPSY  05/03/2020   Procedure: BRONCHIAL BIOPSIES;  Surgeon: Collene Gobble, MD;  Location: [facility] ENDOSCOPY;  Service: Pulmonary;;  . BRONCHIAL BRUSHINGS  05/03/2020   Procedure: BRONCHIAL BRUSHINGS;  Surgeon: Collene Gobble, MD;  Location: [facility] ENDOSCOPY;  Service: Pulmonary;;  . BRONCHIAL NEEDLE ASPIRATION BIOPSY  05/03/2020   Procedure: BRONCHIAL NEEDLE ASPIRATION BIOPSIES;  Surgeon: Collene Gobble, MD;  Location[facility] ENDOSCOPY;  Service: Pulmonary;;  . CHOLECYSTECTOMY  01/06/09  . COLONOSCOPY  05/09/2012   Procedure: COLONOSCOPY;  Surgeon: Inda Castle, MD;  Location: [place];  Service: Endoscopy;  Laterality: N/A;  . COLONOSCOPY WITH PROPOFOL N/A 03/18/2019   Procedure: COLONOSCOPY WITH PROPOFOL;  Surgeon: Rush Landmark Telford Nab., MD;  Location: [place];  Service: Gastroenterology;  Laterality: N/A;  .  ENTEROSCOPY N/A 03/18/2019   Procedure: ENTEROSCOPY;  Surgeon: Mansouraty, Telford Nab., MD;  Location: [place];  Service: Gastroenterology;  Laterality: N/A;  . ESOPHAGOGASTRODUODENOSCOPY  05/09/2012   Procedure: ESOPHAGOGASTRODUODENOSCOPY (EGD);  Surgeon: Inda Castle, MD;  Location: [place];  Service: Endoscopy;  Laterality: N/A;  . FIDUCIAL MARKER PLACEMENT  05/03/2020   Procedure: FIDUCIAL MARKER PLACEMENT;  Surgeon: Collene Gobble, MD;  Location: [facility] ENDOSCOPY;  Service: Pulmonary;;  . HEMOSTASIS CLIP PLACEMENT  03/18/2019   Procedure: HEMOSTASIS CLIP PLACEMENT;  Surgeon:  Irving Copas., MD;  Location: [place];  Service: Gastroenterology;;  . HOT HEMOSTASIS N/A 03/18/2019   Procedure: HOT HEMOSTASIS (ARGON PLASMA COAGULATION/BICAP);  Surgeon: Irving Copas., MD;  Location: [place];  Service: Gastroenterology;  Laterality: N/A;  . POLYPECTOMY  03/18/2019   Procedure: POLYPECTOMY;  Surgeon: Rush Landmark Telford Nab., MD;  Location: [place];  Service: Gastroenterology;;  . [place] INJECTION  03/18/2019   Procedure: SUBMUCOSAL TATTOO INJECTION;  Surgeon: Irving Copas., MD;  Location: [place];  Service: Gastroenterology;;  . VIDEO BRONCHOSCOPY WITH ENDOBRONCHIAL NAVIGATION N/A 05/03/2020   Procedure: VIDEO BRONCHOSCOPY WITH ENDOBRONCHIAL NAVIGATION;  Surgeon: Collene Gobble, MD;  Location: [place] ENDOSCOPY;  Service: Pulmonary;  Laterality: N/A;    Vitals:   08/08/20 0819  BP: (!) 121/92      Subjective Assessment - 08/08/20 0819    Subjective "The way they had me propped up for radiation I've been having a nagging pain in my Lt arm." Her last radiation treatment was 05/31/20. She also reports Lt knee pain that has been going on for awhile, sometimes a funny feeling. She has a SPC and quad cane that she uses for household and community ambulation. She feels she needs a more supportive walking device to keep from falling. She has not had any falls, but has come close reporting turning movement can cause her to lose balance. She reports the doctor has put in an order for a walker.    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 not even 20 minutes    How long can you stand comfortably? not even 30 minutes    How long can you walk comfortably? not really long about 30 minutes    Patient Stated Goals to feel better, help these muscles     Currently in Pain? Yes    Pain Score 7     Pain Location Shoulder    Pain Orientation Left    Pain Descriptors / Indicators Nagging;Throbbing    Pain Type Chronic pain    Pain Onset More than a month ago    Pain Frequency Constant    Aggravating Factors  nothing in particular    Pain Relieving Factors nothing has helped    Multiple Pain Sites Yes    Pain Score 7    Pain Location Knee    Pain Orientation Left    Pain Descriptors / Indicators Aching    Pain Type Chronic pain    Pain Onset More than a month ago    Pain Frequency Constant    Aggravating Factors  sitting, walking, standing    Pain Relieving Factors nothing has helped              [facility] PT Assessment - 08/08/20 0001      Assessment   Medical  Diagnosis Muscle strain of chest wall, initial encounter; Left knee pain, unspecified chronicity; Arthritis    Referring Provider (PT) Mertha Finders, MD (for knee pain)   Onset Date/Surgical Date --   chronic knee pain; shoulder pain since radiation in January 2022   Hand Dominance Right    Next MD Visit for blood pressure on 08/10/20    Prior Therapy no      Precautions   Precautions None      Restrictions   Weight Bearing Restrictions No      Balance Screen   Has the patient fallen in the past 6 months No      [place] residence    Living Arrangements Alone    Additional Comments no stairs      Prior Function   Level of Independence Independent      Cognition   Overall Cognitive Status Within Functional Limits for tasks assessed      Observation/Other Assessments   Focus on Therapeutic Outcomes (FOTO)  61% function to 74% function      Sensation   Light Touch Appears Intact      Coordination   Gross Motor Movements are Fluid and Coordinated Yes      Posture/Postural Control   Posture/Postural Control Postural limitations    Postural Limitations Forward head;Rounded Shoulders;Increased  thoracic kyphosis      AROM   Overall AROM Comments bilateral shoulder AROM WNL, though pain reported with functional IR and ER reach on LUE    Right Knee Extension --   WNL   Right Knee Flexion 130    Left Knee Extension --   WNL   Left Knee Flexion 108      Strength   Right Shoulder Flexion 5/5    Right Shoulder ABduction 5/5    Right Shoulder Internal Rotation 5/5    Right Shoulder External Rotation 5/5    Right Shoulder Horizontal ADduction 5/5    Left Shoulder Flexion 3+/5    Left Shoulder ABduction 3+/5    Left Shoulder Internal Rotation 4-/5    Left Shoulder External Rotation 4-/5    Left Shoulder Horizontal ADduction 5/5    Right Hip Flexion 4/5    Right Hip Extension 3+/5    Right Hip ABduction 4/5    Left Hip Flexion 4-/5    Left Hip Extension 3+/5    Left Hip ABduction 4-/5    Right Knee Flexion 4+/5    Right Knee Extension 4+/5    Left Knee Flexion 4+/5    Left Knee Extension 4+/5      Flexibility   Soft Tissue Assessment /Muscle Length yes    Hamstrings 50% limitation bilaterally    Quadriceps 50% limitation bilaterally      Palpation   Palpation comment TTP Lt pectoral musculature, coracoid process, bicep brachii; diffuse tenderness about anterior Lt knee      Special Tests   Other special tests (+) Neer (+) [employer] [place] (+) Speeds (-) [employer]      Transfers   Five time sit to stand comments  18.9 seconds      Ambulation/Gait   Assistive device Straight cane    Gait Comments decreased gait speed, foot flat initial contact                      Objective measurements completed on examination: See above findings.       [place]  Adult PT Treatment/Exercise - 08/08/20 0001      Self-Care   Self-Care Other Self-Care Comments    Other Self-Care Comments  see patient education                  PT Education - 08/08/20 0931    Education Details Education on current condition, posture, POC, HEP. f/u with MD regarding order  for RW    Person(s) Educated Patient    Methods Explanation;Demonstration;Verbal cues;Handout;Tactile cues    Comprehension Verbalized understanding;Returned demonstration;Verbal cues required;Need further instruction;Tactile cues required            PT Short Term Goals - 08/08/20 0810      PT SHORT TERM GOAL #1   Title Patient will be independent with initial HEP.    Baseline issued at eval.    Time 2    Period Weeks    Status New    Target Date 08/22/20      PT SHORT TERM GOAL #2   Title Therapist will review FOTO results and expected outcome    Baseline FOTO captured at eval.    Time 1    Period Weeks    Status New    Target Date 08/15/20      PT SHORT TERM GOAL #3   Title Patient will demonstrate at least 120 degrees of Lt knee flexion AROM to improve ability to complete transfers.    Baseline 108 Lt knee flexion AROM    Time 3    Period Weeks    Status New    Target Date 08/29/20             PT Long Term Goals - 08/08/20 0921      PT LONG TERM GOAL #1   Title Patient will demonstrate at least 4+/5 strength in Lt shoulder to improve ability to lift and carry objects.    Baseline see flowsheet    Time 8    Period Weeks    Status New    Target Date 10/03/20      PT LONG TERM GOAL #2   Title Patient will demonstrate at least 4+/5 strength in bilateral hip musculature to improve stability about the chain for walking and standing activity.    Baseline See flowsheet.    Time 8    Period Weeks    Status New    Target Date 10/03/20      PT LONG TERM GOAL #3   Title Patient will perform 5x STS test in less than 15 seconds to improve balance and safety.    Baseline 19 seconds    Time 8    Period Weeks    Status New    Target Date 10/03/20      PT LONG TERM GOAL #4   Title Patient will improve FOTO score to 74% function to signify clinically meaningful improvement in functional abilities.    Baseline 61%    Time 8    Period Weeks    Status New     Target Date 10/03/20                  Plan - 08/08/20 0915    Clinical Impression Statement Patient is a 65 y/o female with chief complaint of Lt anterior shoulder/axillary pain that began following radiation therapy in January 2022 and chronic Lt knee pain of insidous onset. Lt shoulder: Her signs and symptoms appear consistent with impingement syndrome likely due to sustained positioning for radiation therapy  and postural abnormalities. Overall she has good Lt shoulder AROM, though has positive impingement special tests, notable Lt shoulder weakness, postural dysfunction, and tautness/palpable tenderness about Lt pectoral musculature, bicep brachii, and coracoid process. Lt knee: She has diffused tenderness about the Lt knee, limited knee flexion AROM and significant bilateral hip weakness. Limited time during initial evaluation to complete BERG, but based upon 5x STS test her score is indicative of increased fall risk. She will benefit from skilled PT to address Lt shoulder and knee pain, improve strength, and improve safety with mobility to decrease risk of falls.    Personal Factors and Comorbidities Age;Time since onset of injury/illness/exacerbation;Comorbidity 1    Comorbidities hypertension    Examination-Activity Limitations Bend;Carry;Lift;Locomotion Level;Reach Overhead;Sit;Squat;Stairs;Stand;Transfers    Examination-Participation Restrictions Cleaning;Shop;Laundry    Stability/Clinical Decision Making Stable/Uncomplicated    Clinical Decision Making Low    Rehab Potential Good    PT Frequency 1x / week    PT Duration 8 weeks    PT Treatment/Interventions ADLs/Self Care Home Management;Cryotherapy;Gait training;Stair training;Therapeutic activities;Therapeutic exercise;Balance training;Neuromuscular re-education;Patient/family education;Manual techniques;Passive range of motion;Dry needling;Taping    PT Next Visit Plan review FOTO, complete BERG or other balance assessment and set  appropriate goal. Manual to Lt shoulder, hip strengthening, review HEP.    PT Home Exercise Plan Access Code: [email or web address]    Recommended Other Services f/u with MD regarding order for RW    Consulted and Agree with Plan of Care Patient           Patient will benefit from skilled therapeutic intervention in order to improve the following deficits and impairments:  Abnormal gait,Decreased range of motion,Difficulty walking,Impaired UE functional use,Pain,Impaired flexibility,Improper body mechanics,Decreased strength,Postural dysfunction  Visit Diagnosis: Chronic left shoulder pain  Chronic pain of left knee  Muscle weakness (generalized)  Abnormal posture     Problem List Patient Active Problem List   Diagnosis Date Noted  . Cervical muscle pain 07/27/2020  . Physical therapy evaluation, initial 07/20/2020  . Left-sided chest wall pain 07/12/2020  . Chest wall muscle strain 07/12/2020  . Sleep difficulties 06/21/2020  . 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 Mass of upper lobe of right lung 05/03/2020  . Need for immunization against influenza 02/29/2020  . Pulmonary nodule seen on imaging study 02/29/2020  . Muscle strain of right upper back 02/12/2020  . COVID-19 09/25/2019  . History of dysuria 09/24/2019  . Myalgia 09/07/2019  . Rhinosinusitis 08/27/2019  . Dysuria 05/25/2019  . Tachycardia 05/25/2019  . Hematuria 05/25/2019  . Ear pain, bilateral 04/27/2019  . 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 Vaginal discharge 01/18/2019  . Abdominal gas pain 12/16/2018  . Pain in right thigh 01/15/2018  . Health care maintenance 03/25/2013  . Depression 12/03/2012  . Iron deficiency anemia 05/08/2012  . HLD (hyperlipidemia) 03/25/2008  . Constipation 11/04/2007  . Essential hypertension 09/24/2006  . Allergies 09/24/2006  . Asthma 09/24/2006  . GERD 09/24/2006   Gwendolyn Grant, PT, DPT, ATC 08/08/20  10:00 AM  [facility] [address] [place], [place], [address] Phone: [phone]   Fax:  [phone]  Name: LANAYA BENNIS MRN: [id number] Date of Birth: 06-25-1955

## 2020-08-09 ENCOUNTER — Inpatient Hospital Stay: Payer: Medicare Other | Attending: Internal Medicine

## 2020-08-09 ENCOUNTER — Other Ambulatory Visit: Payer: Medicare Other

## 2020-08-09 ENCOUNTER — Encounter (HOSPITAL_COMMUNITY): Payer: Self-pay

## 2020-08-09 ENCOUNTER — Other Ambulatory Visit: Payer: Self-pay

## 2020-08-09 ENCOUNTER — Ambulatory Visit (HOSPITAL_COMMUNITY)
Admission: RE | Admit: 2020-08-09 | Discharge: 2020-08-09 | Disposition: A | Discharge status: Home / Self Care | Payer: Medicare Other | Source: Ambulatory Visit | Attending: Internal Medicine | Admitting: Internal Medicine

## 2020-08-09 DIAGNOSIS — C3490 Malignant neoplasm of unspecified part of unspecified bronchus or lung: Secondary | ICD-10-CM | POA: Diagnosis not present

## 2020-08-09 DIAGNOSIS — C3411 Malignant neoplasm of upper lobe, right bronchus or lung: Secondary | ICD-10-CM | POA: Diagnosis not present

## 2020-08-09 DIAGNOSIS — Z7951 Long term (current) use of inhaled steroids: Secondary | ICD-10-CM | POA: Diagnosis not present

## 2020-08-09 DIAGNOSIS — J432 Centrilobular emphysema: Secondary | ICD-10-CM | POA: Diagnosis not present

## 2020-08-09 DIAGNOSIS — Z79899 Other long term (current) drug therapy: Secondary | ICD-10-CM | POA: Insufficient documentation

## 2020-08-09 DIAGNOSIS — I10 Essential (primary) hypertension: Secondary | ICD-10-CM | POA: Insufficient documentation

## 2020-08-09 DIAGNOSIS — Z923 Personal history of irradiation: Secondary | ICD-10-CM | POA: Diagnosis not present

## 2020-08-09 DIAGNOSIS — I2510 Atherosclerotic heart disease of native coronary artery without angina pectoris: Secondary | ICD-10-CM | POA: Diagnosis not present

## 2020-08-09 DIAGNOSIS — J984 Other disorders of lung: Secondary | ICD-10-CM | POA: Diagnosis not present

## 2020-08-09 HISTORY — DX: Malignant (primary) neoplasm, unspecified: C80.1

## 2020-08-09 LAB — CBC WITH DIFFERENTIAL (CANCER CENTER ONLY)
Abs Immature Granulocytes: 0.02 10*3/uL (ref 0.00–0.07)
Basophils Absolute: 0.1 10*3/uL (ref 0.0–0.1)
Basophils Relative: 1 %
Eosinophils Absolute: 0.1 10*3/uL (ref 0.0–0.5)
Eosinophils Relative: 1 %
HCT: 37.4 % (ref 36.0–46.0)
Hemoglobin: 12.3 g/dL (ref 12.0–15.0)
Immature Granulocytes: 1 %
Lymphocytes Relative: 22 %
Lymphs Abs: 1 10*3/uL (ref 0.7–4.0)
MCH: 31.1 pg (ref 26.0–34.0)
MCHC: 32.9 g/dL (ref 30.0–36.0)
MCV: 94.4 fL (ref 80.0–100.0)
Monocytes Absolute: 0.4 10*3/uL (ref 0.1–1.0)
Monocytes Relative: 8 %
Neutro Abs: 2.9 10*3/uL (ref 1.7–7.7)
Neutrophils Relative %: 67 %
Platelet Count: 236 10*3/uL (ref 150–400)
RBC: 3.96 MIL/uL (ref 3.87–5.11)
RDW: 12.3 % (ref 11.5–15.5)
WBC Count: 4.3 10*3/uL (ref 4.0–10.5)
nRBC: 0 % (ref 0.0–0.2)

## 2020-08-09 LAB — CMP (CANCER CENTER ONLY)
ALT: 9 U/L (ref 0–44)
AST: 17 U/L (ref 15–41)
Albumin: 4.2 g/dL (ref 3.5–5.0)
Alkaline Phosphatase: 63 U/L (ref 38–126)
Anion gap: 11 (ref 5–15)
BUN: 10 mg/dL (ref 8–23)
CO2: 27 mmol/L (ref 22–32)
Calcium: 9.2 mg/dL (ref 8.9–10.3)
Chloride: 103 mmol/L (ref 98–111)
Creatinine: 0.75 mg/dL (ref 0.44–1.00)
GFR, Estimated: >60 mL/min
Glucose, Bld: 99 mg/dL (ref 70–99)
Potassium: 3.6 mmol/L (ref 3.5–5.1)
Sodium: 141 mmol/L (ref 135–145)
Total Bilirubin: 1.4 mg/dL — ABNORMAL HIGH (ref 0.3–1.2)
Total Protein: 7.1 g/dL (ref 6.5–8.1)

## 2020-08-09 MED ORDER — IOHEXOL 300 MG/ML  SOLN
75.0000 mL | Freq: Once | INTRAMUSCULAR | Status: AC | PRN
  Administered 2020-08-09: 75 mL via INTRAVENOUS

## 2020-08-09 NOTE — Progress Notes (Signed)
Internal Medicine Clinic Attending  Case discussed with Dr. Seawell  At the time of the visit.  We reviewed the resident's history and exam and pertinent patient test results.  I agree with the assessment, diagnosis, and plan of care documented in the resident's note.  

## 2020-08-10 ENCOUNTER — Ambulatory Visit (INDEPENDENT_AMBULATORY_CARE_PROVIDER_SITE_OTHER): Payer: Medicare Other | Admitting: Internal Medicine

## 2020-08-10 ENCOUNTER — Telehealth: Payer: Self-pay | Admitting: Medical Oncology

## 2020-08-10 ENCOUNTER — Other Ambulatory Visit: Payer: Self-pay

## 2020-08-10 ENCOUNTER — Encounter: Payer: Self-pay | Admitting: Internal Medicine

## 2020-08-10 VITALS — BP 126/78 | HR 99 | Temp 98.3°F | Ht 64.0 in | Wt 98.9 lb

## 2020-08-10 DIAGNOSIS — M542 Cervicalgia: Secondary | ICD-10-CM

## 2020-08-10 DIAGNOSIS — I10 Essential (primary) hypertension: Secondary | ICD-10-CM | POA: Diagnosis not present

## 2020-08-10 DIAGNOSIS — K219 Gastro-esophageal reflux disease without esophagitis: Secondary | ICD-10-CM

## 2020-08-10 DIAGNOSIS — J45909 Unspecified asthma, uncomplicated: Secondary | ICD-10-CM | POA: Diagnosis not present

## 2020-08-10 MED ORDER — PANTOPRAZOLE SODIUM 40 MG PO TBEC: 40.0000 mg | DELAYED_RELEASE_TABLET | Freq: Every day | ORAL | 0 refills | Status: DC

## 2020-08-10 MED ORDER — FLOVENT HFA 110 MCG/ACT IN AERO: 2.0000 | INHALATION_SPRAY | Freq: Two times a day (BID) | RESPIRATORY_TRACT | 1 refills | Status: DC

## 2020-08-10 MED ORDER — BACLOFEN 5 MG PO TABS
5.0000 mg | ORAL_TABLET | Freq: Three times a day (TID) | ORAL | 0 refills | Status: DC | PRN
Start: 2020-08-10 — End: 2020-09-06

## 2020-08-10 MED ORDER — AMLODIPINE BESYLATE 5 MG PO TABS: 5.0000 mg | ORAL_TABLET | Freq: Every day | ORAL | 11 refills | Status: DC

## 2020-08-10 MED ORDER — LOSARTAN POTASSIUM 25 MG PO TABS: 25.0000 mg | ORAL_TABLET | Freq: Every day | ORAL | 3 refills | Status: DC

## 2020-08-10 NOTE — Patient Instructions (Signed)
Dear Kimberly [name],  Thank you for allowing [place] to provide your care today. Today we discussed your blood pressure    I have ordered no labs for you. I will call if any are abnormal.    Today we made no changes to your medications:    Please follow-up in 6 months.    Please call the internal medicine center clinic if you have any questions or concerns, we may be able to help and keep you from a long and expensive emergency room wait. Our clinic and after hours phone number is [phone], the best time to call is Monday through Friday 9 am to 4 pm but there is always someone available 24/7 if you have an emergency. If you need medication refills please notify your pharmacy one week in advance and they will send [place] a request.    If you have not gotten the COVID vaccine, I recommend doing so:  You may get it at your local [facility] or [facility] OR To schedule an appointment for a COVID vaccine or be added to the vaccine wait list: Go to [email or web address]   OR Go to [email or web address]                  OR Call [phone]                                     OR Call [phone] and select Option 2  Thank you for choosing [facility]   [email or web address]">  DASH Eating Plan DASH stands for Dietary Approaches to Stop Hypertension. The DASH eating plan is a healthy eating plan that has been shown to:  Reduce high blood pressure (hypertension).  Reduce your risk for type 2 diabetes, heart disease, and stroke.  Help with weight loss. What are tips for following this plan? Reading food labels  Check food labels for the amount of salt (sodium) per serving. Choose foods with less than 5 percent of the Daily Value of sodium. Generally, foods with less than 300 milligrams (mg) of sodium per serving fit into this eating plan.  To find whole grains, look for the word "whole" as the first word in the ingredient  list. Shopping  Buy products labeled as "low-sodium" or "no salt added."  Buy fresh foods. Avoid canned foods and pre-made or frozen meals. Cooking  Avoid adding salt when cooking. Use salt-free seasonings or herbs instead of table salt or sea salt. Check with your health care provider or pharmacist before using salt substitutes.  Do not fry foods. Cook foods using healthy methods such as baking, boiling, grilling, roasting, and broiling instead.  Cook with heart-healthy oils, such as olive, canola, avocado, soybean, or sunflower oil. Meal planning  Eat a balanced diet that includes: ? 4 or more servings of fruits and 4 or more servings of vegetables each day. Try to fill one-half of your plate with fruits and vegetables. ? 6-8 servings of whole grains each day. ? Less than 6 oz (170 g) of lean meat, poultry, or fish each day. A 3-oz (85-g) serving of meat is about the same size as a deck of cards. One egg equals 1 oz (28 g). ? 2-3 servings of low-fat dairy each day. One serving is 1 cup (237 mL). ? 1 serving of nuts, seeds, or beans 5 times each week. ? 2-3 servings of heart-healthy fats. Healthy fats called omega-3 fatty acids are  found in foods such as walnuts, flaxseeds, fortified milks, and eggs. These fats are also found in cold-water fish, such as sardines, salmon, and mackerel.  Limit how much you eat of: ? Canned or prepackaged foods. ? Food that is high in trans fat, such as some fried foods. ? Food that is high in saturated fat, such as fatty meat. ? Desserts and other sweets, sugary drinks, and other foods with added sugar. ? Full-fat dairy products.  Do not salt foods before eating.  Do not eat more than 4 egg yolks a week.  Try to eat at least 2 vegetarian meals a week.  Eat more home-cooked food and less restaurant, buffet, and fast food.   Lifestyle  When eating at a restaurant, ask that your food be prepared with less salt or no salt, if possible.  If you  drink alcohol: ? Limit how much you use to:  0-1 drink a day for women who are not pregnant.  0-2 drinks a day for men. ? Be aware of how much alcohol is in your drink. In the [place] one drink equals one 12 oz bottle of beer (355 mL), one 5 oz glass of wine (148 mL), or one 1 oz glass of hard liquor (44 mL). General information  Avoid eating more than 2,300 mg of salt a day. If you have hypertension, you may need to reduce your sodium intake to 1,500 mg a day.  Work with your health care provider to maintain a healthy body weight or to lose weight. Ask what an ideal weight is for you.  Get at least 30 minutes of exercise that causes your heart to beat faster (aerobic exercise) most days of the week. Activities may include walking, swimming, or biking.  Work with your health care provider or dietitian to adjust your eating plan to your individual calorie needs. What foods should I eat? Fruits All fresh, dried, or frozen fruit. Canned fruit in natural juice (without added sugar). Vegetables Fresh or frozen vegetables (raw, steamed, roasted, or grilled). Low-sodium or reduced-sodium tomato and vegetable juice. Low-sodium or reduced-sodium tomato sauce and tomato paste. Low-sodium or reduced-sodium canned vegetables. Grains Whole-grain or whole-wheat bread. Whole-grain or whole-wheat pasta. Brown rice. [place]. Bulgur. Whole-grain and low-sodium cereals. Pita bread. Low-fat, low-sodium crackers. Whole-wheat flour tortillas. Meats and other proteins Skinless chicken or [place]. Ground chicken or [place]. Pork with fat trimmed off. Fish and seafood. Egg whites. Dried beans, peas, or lentils. Unsalted nuts, nut butters, and seeds. Unsalted canned beans. Lean cuts of beef with fat trimmed off. Low-sodium, lean precooked or cured meat, such as sausages or meat loaves. Dairy Low-fat (1%) or fat-free (skim) milk. Reduced-fat, low-fat, or fat-free cheeses. Nonfat, low-sodium ricotta or cottage  cheese. Low-fat or nonfat yogurt. Low-fat, low-sodium cheese. Fats and oils Soft margarine without trans fats. Vegetable oil. Reduced-fat, low-fat, or light mayonnaise and salad dressings (reduced-sodium). Canola, safflower, olive, avocado, soybean, and sunflower oils. Avocado. Seasonings and condiments Herbs. Spices. Seasoning mixes without salt. Other foods Unsalted popcorn and pretzels. Fat-free sweets. The items listed above may not be a complete list of foods and beverages you can eat. Contact a dietitian for more information. What foods should I avoid? Fruits Canned fruit in a light or heavy syrup. Fried fruit. Fruit in cream or butter sauce. Vegetables Creamed or fried vegetables. Vegetables in a cheese sauce. Regular canned vegetables (not low-sodium or reduced-sodium). Regular canned tomato sauce and paste (not low-sodium or reduced-sodium). Regular tomato and vegetable juice (  not low-sodium or reduced-sodium). Angie Fava. Olives. Grains Baked goods made with fat, such as croissants, muffins, or some breads. Dry pasta or rice meal packs. Meats and other proteins Fatty cuts of meat. Ribs. Fried meat. Berniece Salines. Bologna, salami, and other precooked or cured meats, such as sausages or meat loaves. Fat from the back of a pig (fatback). Bratwurst. Salted nuts and seeds. Canned beans with added salt. Canned or smoked fish. Whole eggs or egg yolks. Chicken or [place] with skin. Dairy Whole or 2% milk, cream, and half-and-half. Whole or full-fat cream cheese. Whole-fat or sweetened yogurt. Full-fat cheese. Nondairy creamers. Whipped toppings. Processed cheese and cheese spreads. Fats and oils Butter. Stick margarine. Lard. Shortening. Ghee. Bacon fat. Tropical oils, such as coconut, palm kernel, or palm oil. Seasonings and condiments Onion salt, garlic salt, seasoned salt, table salt, and sea salt. Worcestershire sauce. Tartar sauce. Barbecue sauce. Teriyaki sauce. Soy sauce, including reduced-sodium.  Steak sauce. Canned and packaged gravies. Fish sauce. Oyster sauce. Cocktail sauce. Store-bought horseradish. Ketchup. Mustard. Meat flavorings and tenderizers. Bouillon cubes. Hot sauces. Pre-made or packaged marinades. Pre-made or packaged taco seasonings. Relishes. Regular salad dressings. Other foods Salted popcorn and pretzels. The items listed above may not be a complete list of foods and beverages you should avoid. Contact a dietitian for more information. Where to find more information  National Heart, Lung, and Blood Institute: https://wilson-eaton.com/  American Heart Association: www.heart.org  Academy of Nutrition and Dietetics: www.eatright.[place]: www.kidney.org Summary  The DASH eating plan is a healthy eating plan that has been shown to reduce high blood pressure (hypertension). It may also reduce your risk for type 2 diabetes, heart disease, and stroke.  When on the DASH eating plan, aim to eat more fresh fruits and vegetables, whole grains, lean proteins, low-fat dairy, and heart-healthy fats.  With the DASH eating plan, you should limit salt (sodium) intake to 2,300 mg a day. If you have hypertension, you may need to reduce your sodium intake to 1,500 mg a day.  Work with your health care provider or dietitian to adjust your eating plan to your individual calorie needs. This information is not intended to replace advice given to you by your health care provider. Make sure you discuss any questions you have with your health care provider. Document Revised: 04/17/2019 Document Reviewed: 04/17/2019 Elsevier Patient Education  2021 [employer].

## 2020-08-10 NOTE — Telephone Encounter (Signed)
Asking for CT results. I told her Dr Julien Nordmann will go over it tomorrow at her appt.

## 2020-08-10 NOTE — Assessment & Plan Note (Addendum)
Mentions improvement with physical therapy but requesting additional medication for relief. States had some improvement with robaxin. Requesting refills. - C/w PT, robaxin

## 2020-08-10 NOTE — Assessment & Plan Note (Signed)
Pt requires refills on medications with associated diagnosis above.  Reviewed disease process and find this medication to be necessary, will not change dose or alter current therapy. 

## 2020-08-10 NOTE — Progress Notes (Signed)
CC: high blood pressure  HPI: [name] is a 65 y.o. with PMH listed below presenting with complaint of high blood pressure. Please see problem based assessment and plan for further details.  Past Medical History:  Diagnosis Date  . Alcohol abuse, in remission    since around 2005  . Alcoholic hepatitis    fatty liver on imaging- Treated Hepatits C and treated  . Allergic rhinitis   . Anemia    EGD with duodenal AVM, normal colonoscopy 04/2012  . Asthma   . Callus of foot 2009  . Cancer ([place])   . Chest pain, musculoskeletal 2011  . Cholelithiasis    [place] 09/2008: Cholelithiasis is present, s/p cholecystectomy  . Depression   . Excessive cerumen in ear canal    since before 2000  . GERD (gastroesophageal reflux disease)   . History of blood transfusion   . Hyperlipidemia   . Hypertension   . Menopausal syndrome    Treated with estradiol in the past - discontinued 04/2012  . MENOPAUSAL SYNDROME 09/24/2006   2008 Note : The pt is adament about having this medication.  She fully understands the increased risk of heart disease and cancer that is associated with HRT and is willing to accept this risk in order to prevent the debilitating hot flashes she has off this medication.  Will continue to encourage her to try to wean herself off of this medication at our next visit.  2009 note indicated that she tr  . Otitis externa, acute Feb 2012   bilateral, treated with azithromycin after failure of neomycin drops  . Otitis media, acute Feb 2012  . PONV (postoperative nausea and vomiting)     Review of Systems: Review of Systems  Constitutional: Negative for chills, fever and malaise/fatigue.  Eyes: Negative for blurred vision.  Respiratory: Negative for shortness of breath.   Cardiovascular: Negative for chest pain, palpitations and leg swelling.  Gastrointestinal: Negative for constipation, diarrhea, nausea and vomiting.  Musculoskeletal: Positive for joint pain and  myalgias.  Psychiatric/Behavioral: Negative for depression. The patient is not nervous/anxious.   All other systems reviewed and are negative.   Physical Exam: Vitals:   08/10/20 0835 08/10/20 0911  BP: (!) 150/83 126/78  Pulse: 99   Temp: 98.3 F (36.8 C)   TempSrc: Oral   SpO2: 100%   Weight: 98 lb 14.4 oz (44.9 kg)   Height: 5\' 4"  (1.626 m)    Gen: Well-developed, thin-appearing, NAD HEENT: NCAT head, hearing intact CV: RRR, S1, S2 normal Pulm: CTAB, No rales, no wheezes Extm: ROM intact, Peripheral pulses intact, No peripheral edema Skin: Dry, Warm, normal turgor  Assessment & Plan:   Essential hypertension BP Readings from Last 3 Encounters:  08/10/20 126/78  08/08/20 (!) 121/92  07/29/20 (!) 145/96   At goal this visit. Initially elevated at 270 systolic but down to goal bp after given time to rest. Mentions currently taking losartan and amlodipine. Have not been taking her hctz after being instructed not to for hypokalemia. Recently went to ED for elevated blood pressure but was discharged after being told she should follow up with PCP.  A/P At goal c/w current tx. Recent labs with heme/onc show wnl electrolytes, renal fx - C/w losartan 25mg  daily, amlodipine 5mg  daily  GERD Pt requires refills on medications with associated diagnosis above.  Reviewed disease process and find this medication to be necessary, will not change dose or alter current therapy.   Cervical muscle pain  Mentions improvement with physical therapy but requesting additional medication for relief. States had some improvement with robaxin. Requesting refills. - C/w PT, robaxin  Asthma Currently taking Flovent for maintenance. Requesting refills. Denies recent exacerbations.  Patient discussed with Dr. Heber Byram  -Gilberto Better, [facility] Pager: [phone]

## 2020-08-10 NOTE — Progress Notes (Signed)
Internal Medicine Clinic Attending  Case discussed with Dr. Lee  At the time of the visit.  We reviewed the resident's history and exam and pertinent patient test results.  I agree with the assessment, diagnosis, and plan of care documented in the resident's note.    

## 2020-08-10 NOTE — Assessment & Plan Note (Signed)
Currently taking Flovent for maintenance. Requesting refills. Denies recent exacerbations.

## 2020-08-10 NOTE — Assessment & Plan Note (Addendum)
BP Readings from Last 3 Encounters:  08/10/20 126/78  08/08/20 (!) 121/92  07/29/20 (!) 145/96   At goal this visit. Initially elevated at 147 systolic but down to goal bp after given time to rest. Mentions currently taking losartan and amlodipine. Have not been taking her hctz after being instructed not to for hypokalemia. Recently went to ED for elevated blood pressure but was discharged after being told she should follow up with PCP.  A/P At goal c/w current tx. Recent labs with heme/onc show wnl electrolytes, renal fx - C/w losartan 25mg  daily, amlodipine 5mg  daily

## 2020-08-11 ENCOUNTER — Telehealth: Payer: Self-pay

## 2020-08-11 ENCOUNTER — Other Ambulatory Visit: Payer: Self-pay

## 2020-08-11 ENCOUNTER — Inpatient Hospital Stay (HOSPITAL_BASED_OUTPATIENT_CLINIC_OR_DEPARTMENT_OTHER): Payer: Medicare Other | Admitting: Internal Medicine

## 2020-08-11 VITALS — BP 138/93 | HR 105 | Temp 97.9°F | Resp 17 | Ht 64.0 in | Wt 98.2 lb

## 2020-08-11 DIAGNOSIS — I10 Essential (primary) hypertension: Secondary | ICD-10-CM | POA: Diagnosis not present

## 2020-08-11 DIAGNOSIS — C3411 Malignant neoplasm of upper lobe, right bronchus or lung: Secondary | ICD-10-CM

## 2020-08-11 DIAGNOSIS — C3490 Malignant neoplasm of unspecified part of unspecified bronchus or lung: Secondary | ICD-10-CM

## 2020-08-11 DIAGNOSIS — Z79899 Other long term (current) drug therapy: Secondary | ICD-10-CM | POA: Diagnosis not present

## 2020-08-11 DIAGNOSIS — Z923 Personal history of irradiation: Secondary | ICD-10-CM | POA: Diagnosis not present

## 2020-08-11 DIAGNOSIS — Z7951 Long term (current) use of inhaled steroids: Secondary | ICD-10-CM | POA: Diagnosis not present

## 2020-08-11 NOTE — Telephone Encounter (Signed)
Received TC from patient who states she has "pressure in her sinuses".  RN asked patient if she mentioned this to MD yesterday when she was here for office visit.  Pt states she forgot about it. RN encouraged patient to try saline nasal spray 3-4 times daily.  Instructed patient to not to try OTC decongestants d/t hx of HTN, she verbalized understanding. SChaplin, RN,BSN

## 2020-08-11 NOTE — Progress Notes (Signed)
[facility] [phone]   [phone]  OFFICE PROGRESS NOTE  [name], Kimberly A, DO [address] [place] [address]  DIAGNOSIS: Stage IV (T4, N0, M1 a) multifocal adenocarcinoma involving the right upper lobe, right lower lobe as well as left lower lobe diagnosed in December 2021.  Molecular studies by Guardant 360 showed no actionable mutations.  PRIOR THERAPY: SBRT to right and left lung nodules under the care of Dr. Lisbeth Renshaw completed on May 31, 2020.  CURRENT THERAPY: Observation.  INTERVAL HISTORY: Kimberly [name] 65 y.o. female returns to the clinic today for follow-up visit.  The patient is feeling fine today with no concerning complaints.  She denied having any current chest pain, shortness of breath, cough or hemoptysis.  She has no nausea, vomiting, diarrhea or constipation.  She has no headache or visual changes.  She tolerated the SBRT to the right and left lung nodules under the care of Dr. Lisbeth Renshaw fairly well.  The patient had repeat CT scan of the chest and she is here for evaluation and discussion of her risk her results.  MEDICAL HISTORY: Past Medical History:  Diagnosis Date  . Alcohol abuse, in remission    since around 2005  . Alcoholic hepatitis    fatty liver on imaging- Treated Hepatits C and treated  . Allergic rhinitis   . Anemia    EGD with duodenal AVM, normal colonoscopy 04/2012  . Asthma   . Callus of foot 2009  . Cancer ([place])   . Chest pain, musculoskeletal 2011  . Cholelithiasis    [place] 09/2008: Cholelithiasis is present, s/p cholecystectomy  . Depression   . Excessive cerumen in ear canal    since before 2000  . GERD (gastroesophageal reflux disease)   . History of blood transfusion   . Hyperlipidemia   . Hypertension   . Menopausal syndrome    Treated with estradiol in the past - discontinued 04/2012  . MENOPAUSAL SYNDROME 09/24/2006   2008 Note : The pt is adament about having this medication.  She  fully understands the increased risk of heart disease and cancer that is associated with HRT and is willing to accept this risk in order to prevent the debilitating hot flashes she has off this medication.  Will continue to encourage her to try to wean herself off of this medication at our next visit.  2009 note indicated that she tr  . Otitis externa, acute Feb 2012   bilateral, treated with azithromycin after failure of neomycin drops  . Otitis media, acute Feb 2012  . PONV (postoperative nausea and vomiting)     ALLERGIES:  is allergic to banana, grape (artificial) flavor, ibuprofen, and penicillins.  MEDICATIONS:  Current Outpatient Medications  Medication Sig Dispense Refill  . amLODipine (NORVASC) 5 MG tablet Take 1 tablet (5 mg total) by mouth daily. 30 tablet 11  . Baclofen 5 MG TABS Take 5 mg by mouth 3 (three) times daily as needed. 45 tablet 0  . diclofenac Sodium (VOLTAREN) 1 % GEL APPLY 4 GRAMS TOPICALLY 4 TIMES DAILY AS NEEDED 200 g 0  . fluticasone (FLONASE) 50 MCG/ACT nasal spray USE 2 SPRAY(S) IN EACH NOSTRIL ONCE DAILY AS NEEDED FOR ALLERGIES AND RHINITIS 16 g 0  . fluticasone (FLOVENT HFA) 110 MCG/ACT inhaler Inhale 2 puffs into the lungs 2 (two) times daily. 36 g 1  . losartan (COZAAR) 25 MG tablet Take 1 tablet (25 mg total) by mouth  daily. 90 tablet 3  . Melatonin 1 MG CAPS Take 1 capsule (1 mg total) by mouth at bedtime. (Patient not taking: Reported on 08/08/2020) 30 capsule 2  . pantoprazole (PROTONIX) 40 MG tablet Take 1 tablet (40 mg total) by mouth daily. 30 tablet 0  . Phenol-Glycerin (CHLORASEPTIC MAX SORE THROAT) 1.5-33 % LIQD Use as directed 2 sprays in the mouth or throat 4 (four) times daily as needed. 30 mL 0  . pravastatin (PRAVACHOL) 40 MG tablet Take 1 tablet (40 mg total) by mouth every evening. 90 tablet 0  . ramelteon (ROZEREM) 8 MG tablet Take 1 tablet (8 mg total) by mouth at bedtime. (Patient not taking: Reported on 08/08/2020) 30 tablet 1  .  sucralfate (CARAFATE) 1 GM/10ML suspension Take 10 mLs (1 g total) by mouth 4 (four) times daily -  with meals and at bedtime. (Patient not taking: No sig reported) 420 mL 0   No current facility-administered medications for this visit.    SURGICAL HISTORY:  Past Surgical History:  Procedure Laterality Date  . ABDOMINAL HYSTERECTOMY  1980s  . BIOPSY  03/18/2019   Procedure: BIOPSY;  Surgeon: Rush Landmark Telford Nab., MD;  Location: [place];  Service: Gastroenterology;;  . BRONCHIAL BIOPSY  05/03/2020   Procedure: BRONCHIAL BIOPSIES;  Surgeon: Collene Gobble, MD;  Location: [facility] ENDOSCOPY;  Service: Pulmonary;;  . BRONCHIAL BRUSHINGS  05/03/2020   Procedure: BRONCHIAL BRUSHINGS;  Surgeon: Collene Gobble, MD;  Location: [facility] ENDOSCOPY;  Service: Pulmonary;;  . BRONCHIAL NEEDLE ASPIRATION BIOPSY  05/03/2020   Procedure: BRONCHIAL NEEDLE ASPIRATION BIOPSIES;  Surgeon: Collene Gobble, MD;  Location: [facility] ENDOSCOPY;  Service: Pulmonary;;  . CHOLECYSTECTOMY  01/06/09  . COLONOSCOPY  05/09/2012   Procedure: COLONOSCOPY;  Surgeon: Inda Castle, MD;  Location: [place];  Service: Endoscopy;  Laterality: N/A;  . COLONOSCOPY WITH PROPOFOL N/A 03/18/2019   Procedure: COLONOSCOPY WITH PROPOFOL;  Surgeon: Rush Landmark Telford Nab., MD;  Location: [place];  Service: Gastroenterology;  Laterality: N/A;  . ENTEROSCOPY N/A 03/18/2019   Procedure: ENTEROSCOPY;  Surgeon: Rush Landmark Telford Nab., MD;  Location: [place];  Service: Gastroenterology;  Laterality: N/A;  . ESOPHAGOGASTRODUODENOSCOPY  05/09/2012   Procedure: ESOPHAGOGASTRODUODENOSCOPY (EGD);  Surgeon: Inda Castle, MD;  Location: [place];  Service: Endoscopy;  Laterality: N/A;  . FIDUCIAL MARKER PLACEMENT  05/03/2020   Procedure: FIDUCIAL MARKER PLACEMENT;  Surgeon: Collene Gobble, MD;  Location: [facility] ENDOSCOPY;  Service: Pulmonary;;  . HEMOSTASIS CLIP PLACEMENT  03/18/2019   Procedure: HEMOSTASIS CLIP PLACEMENT;  Surgeon:  Irving Copas., MD;  Location: [place];  Service: Gastroenterology;;  . HOT HEMOSTASIS N/A 03/18/2019   Procedure: HOT HEMOSTASIS (ARGON PLASMA COAGULATION/BICAP);  Surgeon: Irving Copas., MD;  Location: [place];  Service: Gastroenterology;  Laterality: N/A;  . POLYPECTOMY  03/18/2019   Procedure: POLYPECTOMY;  Surgeon: Rush Landmark Telford Nab., MD;  Location: [place];  Service: Gastroenterology;;  . [place] INJECTION  03/18/2019   Procedure: SUBMUCOSAL TATTOO INJECTION;  Surgeon: Irving Copas., MD;  Location: [place];  Service: Gastroenterology;;  . VIDEO BRONCHOSCOPY WITH ENDOBRONCHIAL NAVIGATION N/A 05/03/2020   Procedure: VIDEO BRONCHOSCOPY WITH ENDOBRONCHIAL NAVIGATION;  Surgeon: Collene Gobble, MD;  Location: [place] ENDOSCOPY;  Service: Pulmonary;  Laterality: N/A;    REVIEW OF SYSTEMS:  A comprehensive review of systems was negative except for: Constitutional: positive for fatigue   PHYSICAL EXAMINATION: General appearance: alert, cooperative and no distress Head: Normocephalic, without obvious abnormality, atraumatic Neck: no adenopathy, no  JVD, supple, symmetrical, trachea midline and thyroid not enlarged, symmetric, no tenderness/mass/nodules Lymph nodes: Cervical, supraclavicular, and axillary nodes normal. Resp: clear to auscultation bilaterally Back: symmetric, no curvature. ROM normal. No CVA tenderness. Cardio: regular rate and rhythm, S1, S2 normal, no murmur, click, rub or gallop GI: soft, non-tender; bowel sounds normal; no masses,  no organomegaly Extremities: extremities normal, atraumatic, no cyanosis or edema  ECOG PERFORMANCE STATUS: 1 - Symptomatic but completely ambulatory  Blood pressure (!) 138/93, pulse (!) 105, temperature 97.9 F (36.6 C), temperature source Tympanic, resp. rate 17, height 5\' 4"  (1.626 m), weight 98 lb 3.2 oz (44.5 kg), last menstrual period 08/21/1982, SpO2 99 %.  LABORATORY DATA: Lab  Results  Component Value Date   WBC 4.3 08/09/2020   HGB 12.3 08/09/2020   HCT 37.4 08/09/2020   MCV 94.4 08/09/2020   PLT 236 08/09/2020      Chemistry      Component Value Date/Time   NA 141 08/09/2020 1007   NA 140 02/11/2020 1513   K 3.6 08/09/2020 1007   CL 103 08/09/2020 1007   CO2 27 08/09/2020 1007   BUN 10 08/09/2020 1007   BUN 9 02/11/2020 1513   CREATININE 0.75 08/09/2020 1007   CREATININE 0.76 10/15/2013 1329      Component Value Date/Time   CALCIUM 9.2 08/09/2020 1007   ALKPHOS 63 08/09/2020 1007   AST 17 08/09/2020 1007   ALT 9 08/09/2020 1007   BILITOT 1.4 (H) 08/09/2020 1007       RADIOGRAPHIC STUDIES: CT Chest W Contrast  Result Date: 08/09/2020 CLINICAL DATA:  Staging of non-small cell lung cancer EXAM: CT CHEST WITH CONTRAST TECHNIQUE: Multidetector CT imaging of the chest was performed during intravenous contrast administration. CONTRAST:  67mL OMNIPAQUE IOHEXOL 300 MG/ML  SOLN COMPARISON:  Chest CT 05/03/2020 FINDINGS: Cardiovascular: Coronary, aortic arch, and branch vessel atherosclerotic vascular disease. Mediastinum/Nodes: Unremarkable Lungs/Pleura: Centrilobular emphysema. Stable biapical pleuroparenchymal scarring. Scattered multifocal ground-glass density pulmonary nodule suspicious for multifocal low-grade adenocarcinoma, not appreciably changed from 05/03/2020. There are also some solid lesions including a 1.0 by 1.9 cm nodule in the right upper lobe on image 26 series 5 (previously 1.0 by 1.8 cm by my measurements) and right lower lobe subpleural nodule along the major fissure measuring 1.0 by 0.7 cm on image 61 of series 5 (formerly the same). No new lesions are identified. Upper Abdomen: Cholecystectomy. Upper abdominal aortic atherosclerotic vascular calcification. Musculoskeletal: Incidental mesoacromial left os acromiale. Thoracic kyphosis. IMPRESSION: 1. Stable appearance of scattered multifocal ground-glass density and some solid pulmonary  nodules suspicious for multifocal low-grade adenocarcinoma. No new or enlarging lesions are identified. 2. Coronary, aortic arch, and branch vessel atherosclerotic vascular disease. 3. Incidental mesoacromial left os acromiale. 4. Emphysema and aortic atherosclerosis. Aortic Atherosclerosis (ICD10-I70.0) and Emphysema (ICD10-J43.9). Electronically Signed   By: Van Clines M.D.   On: 08/09/2020 15:45    ASSESSMENT AND PLAN: This is a very pleasant 65 years old African-American female with technically stage IV (T4, N0, M1 a) multifocal adenocarcinoma involving the right upper lobe, right lower lobe and left lower lobe diagnosed in December 2021 status post SBRT to the multiple pulmonary nodules under the care of Dr. Lisbeth Renshaw completed in January 2022. The patient has no actionable mutations on Guardant 360 test. The patient is feeling fine today with no concerning complaints. She had repeat CT scan of the chest performed recently.  I personally and independently reviewed the scan images and discussed the results with the  patient today. Her scan showed no concerning findings for disease progression she has a stable nodules in the lung after the radiotherapy. I recommended for her to continue on observation with repeat CT scan of the chest in 6 months. The patient was advised to call immediately if she has any other concerning symptoms in the interval. The patient voices understanding of current disease status and treatment options and is in agreement with the current care plan.  All questions were answered. The patient knows to call the clinic with any problems, questions or concerns. We can certainly see the patient much sooner if necessary.   Disclaimer: This note was dictated with voice recognition software. Similar sounding words can inadvertently be transcribed and may not be corrected upon review.

## 2020-08-11 NOTE — Telephone Encounter (Signed)
Is requesting a call back  For sinus pressure in her head

## 2020-08-11 NOTE — Telephone Encounter (Signed)
RTC, VM obtained and [place] compliant message left that nurse returning her call. SChaplin, RN,BSN

## 2020-08-12 ENCOUNTER — Telehealth: Payer: Self-pay | Admitting: Internal Medicine

## 2020-08-12 NOTE — Telephone Encounter (Signed)
Scheduled per 3/17 los. Called and spoke with pt confirmed 9/19 and 9/20 appts

## 2020-08-15 ENCOUNTER — Other Ambulatory Visit: Payer: Self-pay

## 2020-08-15 ENCOUNTER — Ambulatory Visit: Payer: Medicare Other | Admitting: Physical Therapy

## 2020-08-15 ENCOUNTER — Telehealth: Payer: Self-pay

## 2020-08-15 ENCOUNTER — Encounter: Payer: Self-pay | Admitting: Physical Therapy

## 2020-08-15 DIAGNOSIS — M25512 Pain in left shoulder: Secondary | ICD-10-CM | POA: Diagnosis not present

## 2020-08-15 DIAGNOSIS — R293 Abnormal posture: Secondary | ICD-10-CM | POA: Diagnosis not present

## 2020-08-15 DIAGNOSIS — M25562 Pain in left knee: Secondary | ICD-10-CM | POA: Diagnosis not present

## 2020-08-15 DIAGNOSIS — G8929 Other chronic pain: Secondary | ICD-10-CM | POA: Diagnosis not present

## 2020-08-15 DIAGNOSIS — M6281 Muscle weakness (generalized): Secondary | ICD-10-CM | POA: Diagnosis not present

## 2020-08-15 NOTE — Telephone Encounter (Signed)
Pt called / informed to f/u in 6 months per Dr Sharon Seller. I will ask front office to cancel 3/31 appt. Pt stated she will call back to schedule appt in 6 months.

## 2020-08-15 NOTE — Telephone Encounter (Signed)
Per the last office visit note, she can follow up in six months.

## 2020-08-15 NOTE — Telephone Encounter (Signed)
Return pt's call - stated there's an appt schedule on the 31st with Dr Sharon Seller. Stated she was just here (on 3/16); wanted to know why and stated she's not coming. Stated she's tired of going to doctor's appointments.

## 2020-08-15 NOTE — Telephone Encounter (Signed)
Requesting to speak with Dr. Sharon Seller. Please call pt back.

## 2020-08-15 NOTE — Patient Instructions (Signed)
Access Code: [email or web address] URL: [email or web address] Date: 08/15/2020 Prepared by: Almyra Free  Exercises Seated Scapular Retraction - 2 x daily - 7 x weekly - 3 sets - 10 reps Doorway Pec Stretch at 60 Degrees Abduction with Arm Straight - 2 x daily - 7 x weekly - 3 sets - 30 sec hold Hooklying Clamshell with Resistance - 2 x daily - 7 x weekly - 2 sets - 10 reps Supine Active Straight Leg Raise - 2 x daily - 7 x weekly - 2 sets - 10 reps Modified Thomas Stretch - 2 x daily - 7 x weekly - 3 sets - 30 sec hold Seated Hamstring Stretch - 2 x daily - 7 x weekly - 3 sets - 30 sec hold Standing Row with Anchored Resistance - 1 x daily - 7 x weekly - 2 sets - 10 reps Single Arm Shoulder Extension with Anchored Resistance - 1 x daily - 7 x weekly - 2 sets - 10 reps Sit to Stand - 2-3 x daily - 7 x weekly - 2 sets - 5 reps

## 2020-08-15 NOTE — Therapy (Addendum)
[place], [place], [address] Phone: [phone]   Fax:  [phone]  Physical Therapy Treatment / Discharge  Patient Details  Name: Kimberly [name] MRN: [id number] Date of Birth: 09-Jun-1955 Referring Provider (PT): Brooke Dare, Elaina Pattee, MD (for knee pain))   Encounter Date: 08/15/2020   PT End of Session - 08/15/20 0756    Visit Number 2    Number of Visits 9    Date for PT Re-Evaluation 10/08/20    Authorization Type UHC MCR    Authorization Time Period FOTO visit 6 and 10; Progress note visit 10; Kx visit 15    PT Start Time 0800    PT Stop Time 0844    PT Time Calculation (min) 44 min    Activity Tolerance Patient tolerated treatment well    Behavior During Therapy [facility] for tasks assessed/performed           Past Medical History:  Diagnosis Date  . Alcohol abuse, in remission    since around 2005  . Alcoholic hepatitis    fatty liver on imaging- Treated Hepatits C and treated  . Allergic rhinitis   . Anemia    EGD with duodenal AVM, normal colonoscopy 04/2012  . Asthma   . Callus of foot 2009  . Cancer ([place])   . Chest pain, musculoskeletal 2011  . Cholelithiasis    [place] 09/2008: Cholelithiasis is present, s/p cholecystectomy  . Depression   . Excessive cerumen in ear canal    since before 2000  . GERD (gastroesophageal reflux disease)   . History of blood transfusion   . Hyperlipidemia   . Hypertension   . Menopausal syndrome    Treated with estradiol in the past - discontinued 04/2012  . MENOPAUSAL SYNDROME 09/24/2006   2008 Note : The pt is adament about having this medication.  She fully understands the increased risk of heart disease and cancer that is associated with HRT and is willing to accept this risk in order to prevent the debilitating hot flashes she has off this medication.  Will continue to encourage her to try to wean herself off of this medication at our  next visit.  2009 note indicated that she tr  . Otitis externa, acute Feb 2012   bilateral, treated with azithromycin after failure of neomycin drops  . Otitis media, acute Feb 2012  . PONV (postoperative nausea and vomiting)     Past Surgical History:  Procedure Laterality Date  . ABDOMINAL HYSTERECTOMY  1980s  . BIOPSY  03/18/2019   Procedure: BIOPSY;  Surgeon: Rush Landmark Telford Nab., MD;  Location: [place];  Service: Gastroenterology;;  . BRONCHIAL BIOPSY  05/03/2020   Procedure: BRONCHIAL BIOPSIES;  Surgeon: Collene Gobble, MD;  Location: [facility] ENDOSCOPY;  Service: Pulmonary;;  . BRONCHIAL BRUSHINGS  05/03/2020   Procedure: BRONCHIAL BRUSHINGS;  Surgeon: Collene Gobble, MD;  Location: [facility] ENDOSCOPY;  Service: Pulmonary;;  . BRONCHIAL NEEDLE ASPIRATION BIOPSY  05/03/2020   Procedure: BRONCHIAL NEEDLE ASPIRATION BIOPSIES;  Surgeon: Collene Gobble, MD;  Location: [facility] ENDOSCOPY;  Service: Pulmonary;;  . CHOLECYSTECTOMY  01/06/09  . COLONOSCOPY  05/09/2012   Procedure: COLONOSCOPY;  Surgeon: Inda Castle, MD;  Location: [place];  Service: Endoscopy;  Laterality: N/A;  . COLONOSCOPY WITH PROPOFOL N/A 03/18/2019   Procedure: COLONOSCOPY WITH PROPOFOL;  Surgeon: Rush Landmark Telford Nab., MD;  Location: [place];  Service: Gastroenterology;  Laterality: N/A;  . ENTEROSCOPY N/A 03/18/2019  Procedure: ENTEROSCOPY;  Surgeon: Rush Landmark Telford Nab., MD;  Location: [place];  Service: Gastroenterology;  Laterality: N/A;  . ESOPHAGOGASTRODUODENOSCOPY  05/09/2012   Procedure: ESOPHAGOGASTRODUODENOSCOPY (EGD);  Surgeon: Inda Castle, MD;  Location: [place];  Service: Endoscopy;  Laterality: N/A;  . FIDUCIAL MARKER PLACEMENT  05/03/2020   Procedure: FIDUCIAL MARKER PLACEMENT;  Surgeon: Collene Gobble, MD;  Location: [facility] ENDOSCOPY;  Service: Pulmonary;;  . HEMOSTASIS CLIP PLACEMENT  03/18/2019   Procedure: HEMOSTASIS CLIP PLACEMENT;  Surgeon: Irving Copas., MD;   Location: [place];  Service: Gastroenterology;;  . HOT HEMOSTASIS N/A 03/18/2019   Procedure: HOT HEMOSTASIS (ARGON PLASMA COAGULATION/BICAP);  Surgeon: Irving Copas., MD;  Location: [place];  Service: Gastroenterology;  Laterality: N/A;  . POLYPECTOMY  03/18/2019   Procedure: POLYPECTOMY;  Surgeon: Rush Landmark Telford Nab., MD;  Location: [place];  Service: Gastroenterology;;  . [place] INJECTION  03/18/2019   Procedure: SUBMUCOSAL TATTOO INJECTION;  Surgeon: Irving Copas., MD;  Location: [place];  Service: Gastroenterology;;  . VIDEO BRONCHOSCOPY WITH ENDOBRONCHIAL NAVIGATION N/A 05/03/2020   Procedure: VIDEO BRONCHOSCOPY WITH ENDOBRONCHIAL NAVIGATION;  Surgeon: Collene Gobble, MD;  Location: [place] ENDOSCOPY;  Service: Pulmonary;  Laterality: N/A;    There were no vitals filed for this visit.   Subjective Assessment - 08/15/20 0801    Subjective Patient reports her shoulder feels a little better. Her knee is a little sore.    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 not even 20 minutes    How long can you stand comfortably? not even 30 minutes    How long can you walk comfortably? not really long about 30 minutes    Patient Stated Goals to feel better, help these muscles    Currently in Pain? Yes    Pain Score 5     Pain Location Shoulder    Pain Orientation Left    Pain Descriptors / Indicators Throbbing;Nagging    Pain Type Chronic pain    Multiple Pain Sites Yes    Pain Score 5    Pain Location Knee    Pain Orientation Left    Pain Descriptors / Indicators Aching    Pain Type Chronic pain              [facility] PT Assessment - 08/15/20 0001      Standardized Balance Assessment   Standardized Balance Assessment Berg Balance Test;Five Times Sit to  Stand    Five times sit to stand comments  12.48 sec      Berg Balance Test   Standing Unsupported Able to stand safely 2 minutes    Sitting with Back Unsupported but Feet Supported on Floor or Stool Able to sit safely and securely 2 minutes    Stand to Sit Controls descent by using hands    Transfers Able to transfer safely, minor use of hands    Standing Unsupported with Eyes Closed Able to stand 10 seconds safely    Standing Unsupported with Feet Together Able to place feet together independently and stand 1 minute safely    From Standing, Reach Forward with Outstretched Arm Can reach forward >12 cm safely (5")    From Standing Position, Pick up Object from Floor Able to pick up shoe safely and easily    From Standing Position, Turn to Look  Behind Over each Shoulder Looks behind from both sides and weight shifts well    Turn 360 Degrees Able to turn 360 degrees safely in 4 seconds or less    Standing Unsupported, Alternately Place Feet on Step/Stool Able to stand independently and safely and complete 8 steps in 20 seconds    Standing Unsupported, One Foot in Front Able to place foot tandem independently and hold 30 seconds   challenging   Standing on One Leg Able to lift leg independently and hold > 10 seconds   Right = 20 sec; Left 10 sec                        [facility] Adult PT Treatment/Exercise - 08/15/20 0001      Exercises   Exercises Knee/Hip;Shoulder      Knee/Hip Exercises: Stretches   Passive Hamstring Stretch Both;1 rep;30 seconds    Hip Flexor Stretch Left;1 rep;30 seconds      Knee/Hip Exercises: Seated   Sit to Sand 5 reps      Knee/Hip Exercises: Supine   Straight Leg Raises Both;1 set;10 reps    Other Supine Knee/Hip Exercises reviewed clam      Shoulder Exercises: Seated   Other Seated Exercises scapular retraction x 10 and discussion of posture      Shoulder Exercises: Standing   Extension Both;10 reps    Row Both;10 reps    Theraband Level  (Shoulder Row) Level 3 (Green)      Shoulder Exercises: Stretch   Other Shoulder Stretches doorway stretch left arm at 60degress x 30 sec, Cues to keep elbow straight.                  PT Education - 08/15/20 0843    Education Details FOTO reviewed; HEP progressed    Person(s) Educated Patient    Methods Explanation;Demonstration;Handout    Comprehension Verbalized understanding;Returned demonstration            PT Short Term Goals - 08/15/20 0848      PT SHORT TERM GOAL #1   Title Patient will be independent with initial HEP.    Status Partially Met      PT SHORT TERM GOAL #2   Title Therapist will review FOTO results and expected outcome    Status Achieved             PT Long Term Goals - 08/15/20 0850      PT LONG TERM GOAL #3   Title Patient will perform 5x STS test in less than 10 seconds to improve balance and safety.    Baseline 12.48 sec (at visit 2)    Status Revised      PT LONG TERM GOAL #5   Title Patient to demo improved SLS on Lt LE to > 15 sec to decrase likelihood of falls    Baseline 10 sec; Rt LE 20 sec    Status New                 Plan - 08/15/20 0845    Clinical Impression Statement Patient reporting her shoulder and knee were feeling much better today. She scored 53/56 on the BERG with greatest challenges being tandem stance and SLS. Her 5x sit to stand LTG was met today, so LTG was revised. HEP and FOTO were reviewed and HEP progressed. Patient will benefit from postural strengthening and higher level balance activiites.    Personal Factors and Comorbidities Age;Time  since onset of injury/illness/exacerbation;Comorbidity 1    Comorbidities hypertension    Examination-Activity Limitations Bend;Carry;Lift;Locomotion Level;Reach Overhead;Sit;Squat;Stairs;Stand;Transfers    Examination-Participation Restrictions Cleaning;Shop;Laundry    PT Frequency 1x / week    PT Duration 8 weeks    PT Treatment/Interventions ADLs/Self Care  Home Management;Cryotherapy;Gait training;Stair training;Therapeutic activities;Therapeutic exercise;Balance training;Neuromuscular re-education;Patient/family education;Manual techniques;Passive range of motion;Dry needling;Taping    PT Next Visit Plan Manual to Lt shoulder, hip strengthening, higher level balance, postural strength    PT Home Exercise Plan Access Code: [email or web address]    Consulted and Agree with Plan of Care Patient           Patient will benefit from skilled therapeutic intervention in order to improve the following deficits and impairments:  Abnormal gait,Decreased range of motion,Difficulty walking,Impaired UE functional use,Pain,Impaired flexibility,Improper body mechanics,Decreased strength,Postural dysfunction  Visit Diagnosis: Chronic left shoulder pain  Chronic pain of left knee  Muscle weakness (generalized)  Abnormal posture     Problem List Patient Active Problem List   Diagnosis Date Noted  . Cervical muscle pain 07/27/2020  . Physical therapy evaluation, initial 07/20/2020  . Left-sided chest wall pain 07/12/2020  . Chest wall muscle strain 07/12/2020  . Sleep difficulties 06/21/2020  . 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 Mass of upper lobe of right lung 05/03/2020  . Need for immunization against influenza 02/29/2020  . Pulmonary nodule seen on imaging study 02/29/2020  . Muscle strain of right upper back 02/12/2020  . COVID-19 09/25/2019  . History of dysuria 09/24/2019  . Myalgia 09/07/2019  . Rhinosinusitis 08/27/2019  . Dysuria 05/25/2019  . Tachycardia 05/25/2019  . Hematuria 05/25/2019  . Ear pain, bilateral 04/27/2019  . 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 Vaginal discharge 01/18/2019  . Abdominal gas pain 12/16/2018  . Pain in right thigh 01/15/2018  . Health care maintenance 03/25/2013  . Depression 12/03/2012  . Iron deficiency anemia 05/08/2012  . HLD  (hyperlipidemia) 03/25/2008  . Constipation 11/04/2007  . Essential hypertension 09/24/2006  . Allergies 09/24/2006  . Asthma 09/24/2006  . GERD 09/24/2006    Madelyn Flavors PT 08/15/2020, 8:59 AM  [facility] [address] [place], [place], [address] Phone: [phone]   Fax:  [phone]  Name: Kimberly [name] MRN: [id number] Date of Birth: December 02, 1955      PHYSICAL THERAPY DISCHARGE SUMMARY  Visits from Start of Care: 2  Current functional level related to goals / functional outcomes: See goals   Remaining deficits: Current status unknown due to not returning   Education / Equipment: HEP  Plan: Patient agrees to discharge.  Patient goals were not met. Patient is being discharged due to not returning since the last visit.  ?????        Kristoffer Leamon PT, DPT, LAT, ATC  10/04/20  10:36 AM

## 2020-08-19 ENCOUNTER — Telehealth: Payer: Self-pay

## 2020-08-19 NOTE — Telephone Encounter (Signed)
Called pt - stated she needs to talk to Dr Sharon Seller about her her and stated she's not going back to therapy.

## 2020-08-19 NOTE — Telephone Encounter (Signed)
Requesting to speak with Dr. Sharon Seller. Please call pt back.

## 2020-08-22 ENCOUNTER — Ambulatory Visit (INDEPENDENT_AMBULATORY_CARE_PROVIDER_SITE_OTHER): Payer: Medicare Other | Admitting: Student

## 2020-08-22 ENCOUNTER — Telehealth: Payer: Self-pay

## 2020-08-22 ENCOUNTER — Other Ambulatory Visit: Payer: Self-pay

## 2020-08-22 DIAGNOSIS — J019 Acute sinusitis, unspecified: Secondary | ICD-10-CM | POA: Insufficient documentation

## 2020-08-22 NOTE — Progress Notes (Signed)
  [facility] Internal Medicine Residency Telephone Encounter Continuity Care Appointment  HPI:   This telephone encounter was created for [name] on 08/22/2020 for the following purpose/cc sinus pressure, congestion.   Past Medical History:  Past Medical History:  Diagnosis Date  . Alcohol abuse, in remission    since around 2005  . Alcoholic hepatitis    fatty liver on imaging- Treated Hepatits C and treated  . Allergic rhinitis   . Anemia    EGD with duodenal AVM, normal colonoscopy 04/2012  . Asthma   . Callus of foot 2009  . Cancer ([place])   . Chest pain, musculoskeletal 2011  . Cholelithiasis    [place] 09/2008: Cholelithiasis is present, s/p cholecystectomy  . Depression   . Excessive cerumen in ear canal    since before 2000  . GERD (gastroesophageal reflux disease)   . History of blood transfusion   . Hyperlipidemia   . Hypertension   . Menopausal syndrome    Treated with estradiol in the past - discontinued 04/2012  . MENOPAUSAL SYNDROME 09/24/2006   2008 Note : The pt is adament about having this medication.  She fully understands the increased risk of heart disease and cancer that is associated with HRT and is willing to accept this risk in order to prevent the debilitating hot flashes she has off this medication.  Will continue to encourage her to try to wean herself off of this medication at our next visit.  2009 note indicated that she tr  . Otitis externa, acute Feb 2012   bilateral, treated with azithromycin after failure of neomycin drops  . Otitis media, acute Feb 2012  . PONV (postoperative nausea and vomiting)       ROS:   As per HPI   Assessment / Plan / Recommendations:   Please see A&P under problem oriented charting for assessment of the patient's acute and chronic medical conditions.   As always, pt is advised that if symptoms worsen or new symptoms arise, they should go to an urgent care facility or to to ER for further evaluation.    Consent and Medical Decision Making:   Patient discussed with Dr. Philipp Ovens  This is a telephone encounter between [place] on 08/22/2020 for sinus pressure, congestion. The visit was conducted with the patient located at home and [place] at [facility]. The patient's identity was confirmed using their DOB and current address. The patient has consented to being evaluated through a telephone encounter and understands the associated risks (an examination cannot be done and the patient may need to come in for an appointment) / benefits (allows the patient to remain at home, decreasing exposure to coronavirus). I personally spent 9 minutes on medical discussion.

## 2020-08-22 NOTE — Telephone Encounter (Signed)
RTC, patient c/o:  Chills, fever (has not taken her temp), runny nose and congestion, swollen eyes with pressure in head, and body aches X 1 week, no SOB.  States she can't take most medicines d/t HTN.  RN had suggested pt do saline nasal washes last week for congestion. Telehealth appt made for today @ 1:15, red team full, put on Dr. Lorelee Cover schedule. Pt does have home covid testing kits, RN instructed patient to do a Covid, home test before the Telehealth appt.  Pt is confused on how to do a self test, RN instructed patient to read the directions w/ pictures.  Pt getting frustrated with directions, RN told pt to do her best. SChaplin, RN,BSN

## 2020-08-22 NOTE — Telephone Encounter (Signed)
RTC, VM obtained and message left triage was returning her call. SChaplin, RN,BSN

## 2020-08-22 NOTE — Telephone Encounter (Signed)
Pt calling back, pls return call [phone]

## 2020-08-22 NOTE — Telephone Encounter (Signed)
Pls contact pt [phone] regarding sinus medicine

## 2020-08-22 NOTE — Assessment & Plan Note (Addendum)
Patient reports 1 week history of sinus pressure, congestion, and mild sore throat. States symptoms have continued since onset without relief. She mentions that she cannot take many over the counter medications, including ibuprofen and acetaminophen due to her high blood pressure and GI upset. She denies fevers, chills, nausea, vomiting, abdominal pain. Mentions she has had one episode of diarrhea during this time. No known sick contacts. Reports she took an at-home COVID-19 test earlier today that was negative.  A/P: Discussed with patient that antibiotics and steroids are not needed for acute sinusitis. Encouraged use of nasal irrigation and Coricidin HBP cold/cough, which does not use acetaminophen. Instructed that if symptoms do not improve within the next 1-2 weeks to return to clinic for further evaluation. - Nasal irrigation - Coricidin HBP

## 2020-08-23 NOTE — Progress Notes (Signed)
Internal Medicine Clinic Attending ? ?Case discussed with Dr. Braswell  At the time of the visit.  We reviewed the resident?s history and exam and pertinent patient test results.  I agree with the assessment, diagnosis, and plan of care documented in the resident?s note.  ?

## 2020-08-24 ENCOUNTER — Ambulatory Visit: Payer: Medicare Other

## 2020-08-25 ENCOUNTER — Encounter: Payer: Medicare Other | Admitting: Internal Medicine

## 2020-08-25 ENCOUNTER — Other Ambulatory Visit: Payer: Self-pay | Admitting: Internal Medicine

## 2020-08-25 DIAGNOSIS — E785 Hyperlipidemia, unspecified: Secondary | ICD-10-CM

## 2020-09-05 ENCOUNTER — Telehealth: Payer: Self-pay | Admitting: Internal Medicine

## 2020-09-05 NOTE — Telephone Encounter (Signed)
I agree. She can use OTC tylenol 650 q4-6 hours and ibuprofen 400 mg q6 hours for now. She may need additional medications once she is evaluated tomorrow. Thank you!

## 2020-09-05 NOTE — Telephone Encounter (Signed)
So she is already schedule for an appointment. Does she need another referral?

## 2020-09-05 NOTE — Telephone Encounter (Signed)
Yes she has already sch her appt and has medicaid and there office will need a referral due to her Medicaid.

## 2020-09-05 NOTE — Telephone Encounter (Signed)
Pt is requesting a referral to the Eye [facility] if possible for her yearly Eye Exam and f/u for 2022 as she has Medicaid Secondary.  Please advise as patient states she has an appt already sch for Friday 09/09/2020.

## 2020-09-05 NOTE — Telephone Encounter (Signed)
Pt requesting a call back as she had the ambulance out to her home on yesterday and was instructed to call the clinic this morning.  Pt reports the ambulance was called because she had a tight knot under her left arm where she had her recent radiation performed.

## 2020-09-05 NOTE — Telephone Encounter (Signed)
RTC, patient states " I had to call the paramedic yesterday because my  Left arm was hurtin, the same arm they lifted over my head 3 times for radiation.  The paramedic said my muscle was real tight under that arm.  I didn't end up going nowhere yesterday.  What can I take for that pain and muscle tightness"?  RN informed patient she has an appt at [facility] tomorrow and can speak with the MD at that time. SChaplin, RN,BSN

## 2020-09-06 ENCOUNTER — Other Ambulatory Visit: Payer: Self-pay

## 2020-09-06 ENCOUNTER — Ambulatory Visit (INDEPENDENT_AMBULATORY_CARE_PROVIDER_SITE_OTHER): Payer: Medicare Other | Admitting: Student

## 2020-09-06 ENCOUNTER — Encounter: Payer: Self-pay | Admitting: Student

## 2020-09-06 VITALS — BP 114/65 | HR 88 | Temp 98.5°F | Ht 64.0 in | Wt 94.6 lb

## 2020-09-06 DIAGNOSIS — I10 Essential (primary) hypertension: Secondary | ICD-10-CM

## 2020-09-06 DIAGNOSIS — R630 Anorexia: Secondary | ICD-10-CM

## 2020-09-06 DIAGNOSIS — K219 Gastro-esophageal reflux disease without esophagitis: Secondary | ICD-10-CM

## 2020-09-06 DIAGNOSIS — M62838 Other muscle spasm: Secondary | ICD-10-CM

## 2020-09-06 DIAGNOSIS — F5104 Psychophysiologic insomnia: Secondary | ICD-10-CM | POA: Diagnosis not present

## 2020-09-06 DIAGNOSIS — R634 Abnormal weight loss: Secondary | ICD-10-CM

## 2020-09-06 DIAGNOSIS — M791 Myalgia, unspecified site: Secondary | ICD-10-CM

## 2020-09-06 MED ORDER — MIRTAZAPINE 7.5 MG PO TABS
7.5000 mg | ORAL_TABLET | Freq: Every day | ORAL | 0 refills | Status: DC
Start: 2020-09-06 — End: 2020-09-20

## 2020-09-06 MED ORDER — RAMELTEON 8 MG PO TABS: 8.0000 mg | ORAL_TABLET | Freq: Every evening | ORAL | 1 refills | Status: DC | PRN

## 2020-09-06 MED ORDER — METHOCARBAMOL 500 MG PO TABS
500.0000 mg | ORAL_TABLET | Freq: Three times a day (TID) | ORAL | 0 refills | Status: DC
Start: 2020-09-06 — End: 2020-09-20

## 2020-09-06 MED ORDER — PANTOPRAZOLE SODIUM 40 MG PO TBEC: 40.0000 mg | DELAYED_RELEASE_TABLET | Freq: Every day | ORAL | 3 refills | Status: DC

## 2020-09-06 NOTE — Patient Instructions (Signed)
Kimberly [name],   For your muscle spasms, please START taking Robaxin 1 tablet up to 3 times per day (every 8 hours) as needed. You may also take Tylenol 650mg  up to every 6 hours as needed (available over the counter). This should not interfere with your blood pressure.   To help stimulate your appetite, please START taking Mirtazapine 1 tablet nightly.   To help you sleep, you may take 1 tablet of Ramelteon 30 minutes before you'd like to fall asleep if the above are unsuccessful in improving your sleep.  Your pain does seem typical for muscle spasm; however, I will reach out to your oncologist to be sure there is nothing more aside from the above that they would like to do for your appetite or your pain.   Please call [phone] to schedule a return visit with me in 2 weeks to reassess your symptoms and call sooner if your symptoms continue to worsen.   Thank you and take care,  Dr. Konrad Penta    Muscle Cramps and Spasms Muscle cramps and spasms are when muscles tighten by themselves. They usually get better within minutes. Muscle cramps are painful. They are usually stronger and last longer than muscle spasms. Muscle spasms may or may not be painful. They can last a few seconds or much longer. Cramps and spasms can affect any muscle, but they occur most often in the calf muscles of the leg. They are usually not caused by a serious problem. In many cases, the cause is not known. Some common causes include:  Doing more physical work or exercise than your body is ready for.  Using the muscles too much (overuse) by repeating certain movements too many times.  Staying in a certain position for a long time.  Playing a sport or doing an activity without preparing properly.  Using bad form or technique while playing a sport or doing an activity.  Not having enough water in your body (dehydration).  Injury.  Side effects of some medicines.  Low levels of the salts and minerals in your  blood (electrolytes), such as low potassium or calcium. Follow these instructions at home: Managing pain and stiffness  Massage, stretch, and relax the muscle. Do this for many minutes at a time.  If told, put heat on tight or tense muscles as often as told by your doctor. Use the heat source that your doctor recommends, such as a moist heat pack or a heating pad. ? Place a towel between your skin and the heat source. ? Leave the heat on for 20-30 minutes. ? Remove the heat if your skin turns bright red. This is very important if you are not able to feel pain, heat, or cold. You may have a greater risk of getting burned.  If told, put ice on the affected area. This may help if you are sore or have pain after a cramp or spasm. ? Put ice in a plastic bag. ? Place a towel between your skin and the bag. ? Leave the ice on for 20 minutes, 2-3 times a day.  Try taking hot showers or baths to help relax tight muscles.      Eating and drinking  Drink enough fluid to keep your pee (urine) pale yellow.  Eat a healthy diet to help ensure that your muscles work well. This should include: ? Fruits and vegetables. ? Lean protein. ? Whole grains. ? Low-fat or nonfat dairy products. General instructions  If you are having  cramps often, avoid intense exercise for several days.  Take over-the-counter and prescription medicines only as told by your doctor.  Watch for any changes in your symptoms.  Keep all follow-up visits as told by your doctor. This is important. Contact a doctor if:  Your cramps or spasms get worse or happen more often.  Your cramps or spasms do not get better with time. Summary  Muscle cramps and spasms are when muscles tighten by themselves. They usually get better within minutes.  Cramps and spasms occur most often in the calf muscles of the leg.  Massage, stretch, and relax the muscle. This may help the cramp or spasm go away.  Drink enough fluid to keep your  pee (urine) pale yellow. This information is not intended to replace advice given to you by your health care provider. Make sure you discuss any questions you have with your health care provider. Document Revised: 10/07/2017 Document Reviewed: 10/07/2017 Elsevier Patient Education  [place].

## 2020-09-07 ENCOUNTER — Other Ambulatory Visit: Payer: Self-pay | Admitting: Internal Medicine

## 2020-09-07 DIAGNOSIS — Z0000 Encounter for general adult medical examination without abnormal findings: Secondary | ICD-10-CM

## 2020-09-07 NOTE — Assessment & Plan Note (Signed)
Patient is acutely losing weight, now down to 94 lbs. She attributes this to loss of appetite and poor PO intake secondary to pre-occupation with pain. May be in the setting of lung carcinoma as well.   - Start Mirtazapine 7.5mg  daily  - Work to control pain  - Will reach out to her oncology team to see if they have any additional recommendations

## 2020-09-07 NOTE — Progress Notes (Signed)
CC: Muscle Spasms   HPI:  [name] is a 65 y.o. 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 She says the pain has remained constant since January, attributing it to having to raise her arms for >30 minutes for SBRT. She endorses generalized weakness in the setting of persistent weight loss, saying she has lost about 12 lbs over the past several months. She attributes this weight loss to loss of appetite that she says is secondary to her muscle spasms. She also notes insomnia due to pain. She has avoided NSAIDs and Tylenol due to fear of raising her blood pressure (with HTN in the past) and says baclofen and Flexeril she has used in the past were ineffective (despite previous charts noting Flexeril did work well). She has tried heat and voltaren gel without relief. She denies fevers, chills, worsening cough or SOB, numbness, focal weakness of her extremities, abdominal pain, nausea, or any other symptoms.   Past Medical History:  Diagnosis Date  . Alcohol abuse, in remission    since around 2005  . Alcoholic hepatitis    fatty liver on imaging- Treated Hepatits C and treated  . Allergic rhinitis   . Anemia    EGD with duodenal AVM, normal colonoscopy 04/2012  . Asthma   . Callus of foot 2009  . Cancer ([place])   . Chest pain, musculoskeletal 2011  . Cholelithiasis    [place] 09/2008: Cholelithiasis is present, s/p cholecystectomy  . Depression   . Excessive cerumen in ear canal    since before 2000  . GERD (gastroesophageal reflux disease)   . History of blood transfusion   . Hyperlipidemia   . Hypertension   . Menopausal syndrome    Treated with estradiol in the past - discontinued 04/2012  . MENOPAUSAL SYNDROME 09/24/2006   2008 Note : The pt is adament about having this medication.  She fully understands the  increased risk of heart disease and cancer that is associated with HRT and is willing to accept this risk in order to prevent the debilitating hot flashes she has off this medication.  Will continue to encourage her to try to wean herself off of this medication at our next visit.  2009 note indicated that she tr  . Otitis externa, acute Feb 2012   bilateral, treated with azithromycin after failure of neomycin drops  . Otitis media, acute Feb 2012  . PONV (postoperative nausea and vomiting)    Social Hx:  Patient continues to smoke and has >30 pack year smoking history.  Denies alcohol or illicit drug use.   Review of Systems:  All others negative except as noted above in HPI.   Physical Exam:  Vitals:   09/06/20 0839  BP: 114/65  Pulse: 88  Temp: 98.5 F (36.9 C)  TempSrc: Oral  SpO2: 100%  Weight: 94 lb 9.6 oz (42.9 kg)  Height: 5\' 4"  (1.626 m)   General: Patient appears very thin and chronically ill although otherwise well, in no acute distress. Eyes: Sclera non-icteric. No conjunctival injection.  HENT: Neck is supple. No palpable cervical lymphadenopathy. No nasal discharge. Cardiovascular: Regular rate and rhythm. No lower extremity edema. Extremities are warm. Neurological: Alert and oriented x 3. Sensation to light touch is present and equal in bilateral upper extremities.  Musculoskeletal: There is significant tenderness to palpation  over the superolateral pectoralis major muscle without active spasm. No left-sided axillary lymphadenopathy. Strength is 4+/5 and equal in all four extremities.  Skin: No lesions. No rashes.  Psych: Flat affect. Pressured speech.  Assessment & Plan:   See Encounters Tab for problem based charting.  Patient discussed with Dr. Dareen Piano.  Jeralyn Bennett, MD 09/07/2020, 7:36 AM Pager: [phone]

## 2020-09-07 NOTE — Assessment & Plan Note (Signed)
Blood pressure today is very well-controlled.   - Continue current antihypertensive regimen

## 2020-09-07 NOTE — Assessment & Plan Note (Addendum)
Patient continues to experience severe superolateral chest wall / anterior axillary pain. Her symptoms and exam seem most consistent with strain vs. Overuse vs. Spasm of the insertion site of the left pectoralis major muscle. She does carry a heavy back in this arm and I wonder if this along with her cachexia are contributing to her symptoms. She does have stage IV lung adenocarcinoma, although this has been stable with recent imaging in the last month without active lesion in this region. She has no overlying skin findings to suggest radiation change in this area. This has been an ongoing issue that has not responded to heat, topical gels / patches, baclofen, Flexeril, or PT. She states she is not interested in continuing in-person PT sessions as they are not effective; however, will continue performing exercises at home.  - Start Robaxin 500mg  TID PRN for spasms  - Encouraged Tylenol 650mg  q6 hours PRN for pain  - Encouraged her to stay active and continue PT exercises

## 2020-09-07 NOTE — Telephone Encounter (Signed)
Ok sounds good. Just making sure. I put in the referral.

## 2020-09-07 NOTE — Progress Notes (Signed)
Eye exam orders as requested for Medicaid purposes.

## 2020-09-07 NOTE — Assessment & Plan Note (Signed)
Patient endorses troubles sleeping at night due to spasms of her left chest/arm. She does continue to smoke which also may be contributing.   - Encouraged good sleep hygiene and smoking cessation  - Start Ramelteon nightly if pain control alone is ineffective

## 2020-09-08 NOTE — Progress Notes (Signed)
Internal Medicine Clinic Attending  Case discussed with Dr. Speakman  At the time of the visit.  We reviewed the resident's history and exam and pertinent patient test results.  I agree with the assessment, diagnosis, and plan of care documented in the resident's note.  

## 2020-09-09 DIAGNOSIS — H25013 Cortical age-related cataract, bilateral: Secondary | ICD-10-CM | POA: Diagnosis not present

## 2020-09-09 DIAGNOSIS — H25043 Posterior subcapsular polar age-related cataract, bilateral: Secondary | ICD-10-CM | POA: Diagnosis not present

## 2020-09-09 DIAGNOSIS — I10 Essential (primary) hypertension: Secondary | ICD-10-CM | POA: Diagnosis not present

## 2020-09-09 DIAGNOSIS — H2512 Age-related nuclear cataract, left eye: Secondary | ICD-10-CM | POA: Diagnosis not present

## 2020-09-09 DIAGNOSIS — H2513 Age-related nuclear cataract, bilateral: Secondary | ICD-10-CM | POA: Diagnosis not present

## 2020-09-12 ENCOUNTER — Telehealth: Payer: Self-pay

## 2020-09-12 NOTE — Telephone Encounter (Signed)
Pls requesting a call back [phone]

## 2020-09-12 NOTE — Telephone Encounter (Signed)
Return pt's call - she wants to know why she has an appt on 4/26. Explained to pt it's a f/u visit to reassess pain and medications. She stated "ok".

## 2020-09-14 ENCOUNTER — Other Ambulatory Visit: Payer: Self-pay

## 2020-09-14 ENCOUNTER — Ambulatory Visit
Admission: RE | Admit: 2020-09-14 | Discharge: 2020-09-14 | Disposition: A | Discharge status: Home / Self Care | Payer: Medicare Other | Source: Ambulatory Visit | Attending: Internal Medicine | Admitting: Internal Medicine

## 2020-09-14 DIAGNOSIS — Z1231 Encounter for screening mammogram for malignant neoplasm of breast: Secondary | ICD-10-CM

## 2020-09-16 ENCOUNTER — Telehealth: Payer: Self-pay

## 2020-09-16 NOTE — Telephone Encounter (Signed)
Requesting to speak with a nurse about something, please call pt back.  

## 2020-09-16 NOTE — Telephone Encounter (Signed)
Noted. She has an appt coming up so let's see if she comes then we can address her concerns.

## 2020-09-16 NOTE — Telephone Encounter (Signed)
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  Don't send no referral to no eye doctor for me.  I already have an eye doctor that you sent me to 2 years ago.  I am sick and tired of people up there in my business.  Tell everybody up there that I ain't crazy and I can handle my own business and to stay out of it.  I am sick of y'all"!  RN could not get a word in, patient hung up on RN. SChaplin, RN,BSN

## 2020-09-20 ENCOUNTER — Other Ambulatory Visit: Payer: Self-pay

## 2020-09-20 ENCOUNTER — Encounter: Payer: Self-pay | Admitting: Student

## 2020-09-20 ENCOUNTER — Ambulatory Visit (INDEPENDENT_AMBULATORY_CARE_PROVIDER_SITE_OTHER): Payer: Medicare Other | Admitting: Student

## 2020-09-20 DIAGNOSIS — M62838 Other muscle spasm: Secondary | ICD-10-CM

## 2020-09-20 DIAGNOSIS — J310 Chronic rhinitis: Secondary | ICD-10-CM

## 2020-09-20 DIAGNOSIS — R634 Abnormal weight loss: Secondary | ICD-10-CM | POA: Diagnosis not present

## 2020-09-20 DIAGNOSIS — S29011D Strain of muscle and tendon of front wall of thorax, subsequent encounter: Secondary | ICD-10-CM | POA: Diagnosis not present

## 2020-09-20 DIAGNOSIS — J45909 Unspecified asthma, uncomplicated: Secondary | ICD-10-CM

## 2020-09-20 DIAGNOSIS — T7840XD Allergy, unspecified, subsequent encounter: Secondary | ICD-10-CM

## 2020-09-20 DIAGNOSIS — Z0000 Encounter for general adult medical examination without abnormal findings: Secondary | ICD-10-CM

## 2020-09-20 DIAGNOSIS — J329 Chronic sinusitis, unspecified: Secondary | ICD-10-CM

## 2020-09-20 DIAGNOSIS — R630 Anorexia: Secondary | ICD-10-CM

## 2020-09-20 MED ORDER — MIRTAZAPINE 7.5 MG PO TABS: 7.5000 mg | ORAL_TABLET | Freq: Every day | ORAL | 0 refills | Status: DC

## 2020-09-20 MED ORDER — FLOVENT HFA 110 MCG/ACT IN AERO: 2.0000 | INHALATION_SPRAY | Freq: Two times a day (BID) | RESPIRATORY_TRACT | 1 refills | Status: AC

## 2020-09-20 MED ORDER — FLUTICASONE PROPIONATE 50 MCG/ACT NA SUSP
NASAL | 0 refills | Status: AC
Start: 2020-09-20 — End: ?

## 2020-09-20 MED ORDER — METHOCARBAMOL 500 MG PO TABS: 500.0000 mg | ORAL_TABLET | Freq: Three times a day (TID) | ORAL | 0 refills | Status: DC

## 2020-09-20 NOTE — Progress Notes (Signed)
  [facility] Internal Medicine Residency Telephone Encounter Continuity Care Appointment  HPI:   This telephone encounter was created for Ms. [name] on 09/20/2020 for the following purpose/cc: weight loss and muscle spasms. Please see problem-based assessment and plan for full details.    Past Medical History:  Past Medical History:  Diagnosis Date  . Alcohol abuse, in remission    since around 2005  . Alcoholic hepatitis    fatty liver on imaging- Treated Hepatits C and treated  . Allergic rhinitis   . Anemia    EGD with duodenal AVM, normal colonoscopy 04/2012  . Asthma   . Callus of foot 2009  . Cancer ([place])   . Chest pain, musculoskeletal 2011  . Cholelithiasis    [place] 09/2008: Cholelithiasis is present, s/p cholecystectomy  . Depression   . Excessive cerumen in ear canal    since before 2000  . GERD (gastroesophageal reflux disease)   . History of blood transfusion   . Hyperlipidemia   . Hypertension   . Menopausal syndrome    Treated with estradiol in the past - discontinued 04/2012  . MENOPAUSAL SYNDROME 09/24/2006   2008 Note : The pt is adament about having this medication.  She fully understands the increased risk of heart disease and cancer that is associated with HRT and is willing to accept this risk in order to prevent the debilitating hot flashes she has off this medication.  Will continue to encourage her to try to wean herself off of this medication at our next visit.  2009 note indicated that she tr  . Otitis externa, acute Feb 2012   bilateral, treated with azithromycin after failure of neomycin drops  . Otitis media, acute Feb 2012  . PONV (postoperative nausea and vomiting)       Social Hx:  Patient states she quit smoking 31 years ago and quit drinking alcohol 16 years ago. She denies any illicit drug use.  She says she will be transferring to a new PCP as transportation is currently a limiting factor for her.   ROS:   All others negative  except as noted in assessment / plan.    Assessment / Plan / Recommendations:   Please see A&P under problem oriented charting for assessment of the patient's acute and chronic medical conditions.   As always, pt is advised that if symptoms worsen or new symptoms arise, they should go to an urgent care facility or to to ER for further evaluation.   Consent and Medical Decision Making:   Patient discussed with Dr. Jimmye Norman  This is a telephone encounter between [name] and Jeralyn Bennett on 09/20/2020 for follow up of weight loss and muscle spasms. The visit was conducted with the patient located at home and Jeralyn Bennett at [facility]. The patient's identity was confirmed using their DOB and current address. The patient has consented to being evaluated through a telephone encounter and understands the associated risks (an examination cannot be done and the patient may need to come in for an appointment) / benefits (allows the patient to remain at home, decreasing exposure to coronavirus). I personally spent 10 minutes on medical discussion.    Jeralyn Bennett, MD 09/20/2020, 9:03 AM Pager: [phone]

## 2020-09-20 NOTE — Assessment & Plan Note (Addendum)
Kimberly [name] states her appetite has returned after starting low-dose Mirtazapine. She says she has developed "a bit of a stomach" and is satisfied with her weight gain. Spoke with her oncologist, Dr. Julien Nordmann, who recommended CT abdomen/pelvis if she were to develop concerning symptoms in the future; however, there is currently no concern for metastasis or recurrence of lung cancer and she denies any systemic symptoms currently.  - Continue Mirtazapine 7.5mg  nightly

## 2020-09-20 NOTE — Assessment & Plan Note (Addendum)
Ms. Posten states her chest wall / right axillary pain has resolved taking Robaxin TID. She says she's looking forward to not requiring this medication although understands it can take some time for muscle pain associated with strain to resolve.   - Will refill Robaxin 500mg  TID PRN for pain  - Encouraged the use of Tylenol QID PRN as well  - Continue heat and physical activity as tolerated

## 2020-09-20 NOTE — Assessment & Plan Note (Signed)
Patient notes that she has scheduled bilateral eye surgeries and ocular follow up as well as her DEXA scan and yearly mammogram. She says she has scheduled an appointment with a new PCP at [facility] on [facility] next week and will be switching practices due to easier transportation.   - Will inform the front desk of patient's intended transfer and send over documents as requested

## 2020-09-27 NOTE — Progress Notes (Signed)
Internal Medicine Clinic Attending  Case discussed with Dr. Speakman  At the time of the visit.  We reviewed the resident's history and pertinent patient test results.  I agree with the assessment, diagnosis, and plan of care documented in the resident's note.  

## 2020-10-08 ENCOUNTER — Encounter (HOSPITAL_COMMUNITY): Payer: Self-pay

## 2020-10-08 ENCOUNTER — Ambulatory Visit (HOSPITAL_COMMUNITY)
Admission: EM | Admit: 2020-10-08 | Discharge: 2020-10-08 | Disposition: A | Discharge status: Home / Self Care | Payer: Medicare Other | Attending: Family Medicine | Admitting: Family Medicine

## 2020-10-08 DIAGNOSIS — N3000 Acute cystitis without hematuria: Secondary | ICD-10-CM | POA: Diagnosis not present

## 2020-10-08 DIAGNOSIS — B9689 Other specified bacterial agents as the cause of diseases classified elsewhere: Secondary | ICD-10-CM | POA: Diagnosis not present

## 2020-10-08 LAB — POCT URINALYSIS DIPSTICK, ED / UC
Bilirubin Urine: NEGATIVE
Glucose, UA: NEGATIVE mg/dL
Hgb urine dipstick: NEGATIVE
Ketones, ur: NEGATIVE mg/dL
Nitrite: NEGATIVE
Protein, ur: NEGATIVE mg/dL
Specific Gravity, Urine: 1.015 (ref 1.005–1.030)
Urobilinogen, UA: 0.2 mg/dL (ref 0.0–1.0)
pH: >=9 (ref 5.0–8.0)

## 2020-10-08 MED ORDER — CIPROFLOXACIN HCL 250 MG PO TABS: 250.0000 mg | ORAL_TABLET | Freq: Two times a day (BID) | ORAL | 0 refills | Status: DC

## 2020-10-08 NOTE — ED Provider Notes (Signed)
[facility]    CSN: [id number] Arrival date & time: 10/08/20  1018      History   Chief Complaint Chief Complaint  Patient presents with  . Urinary Frequency    HPI Kimberly [name] is a 65 y.o. female.   Presenting today with 2-day history of urinary frequency and dysuria.  Denies fever, chills, nausea, vomiting, flank pain, vaginal symptoms, hematuria.  Has not tried anything over-the-counter for symptoms thus far.  Does have a history of urinary tract infections.     Past Medical History:  Diagnosis Date  . Alcohol abuse, in remission    since around 2005  . Alcoholic hepatitis    fatty liver on imaging- Treated Hepatits C and treated  . Allergic rhinitis   . Anemia    EGD with duodenal AVM, normal colonoscopy 04/2012  . Asthma   . Callus of foot 2009  . Cancer ([place])   . Chest pain, musculoskeletal 2011  . Cholelithiasis    [place] 09/2008: Cholelithiasis is present, s/p cholecystectomy  . Depression   . Excessive cerumen in ear canal    since before 2000  . GERD (gastroesophageal reflux disease)   . History of blood transfusion   . Hyperlipidemia   . Hypertension   . Menopausal syndrome    Treated with estradiol in the past - discontinued 04/2012  . MENOPAUSAL SYNDROME 09/24/2006   2008 Note : The pt is adament about having this medication.  She fully understands the increased risk of heart disease and cancer that is associated with HRT and is willing to accept this risk in order to prevent the debilitating hot flashes she has off this medication.  Will continue to encourage her to try to wean herself off of this medication at our next visit.  2009 note indicated that she tr  . Otitis externa, acute Feb 2012   bilateral, treated with azithromycin after failure of neomycin drops  . Otitis media, acute Feb 2012  . PONV (postoperative nausea and vomiting)     Patient Active Problem List   Diagnosis Date Noted  . Left-sided chest wall pain 07/12/2020  .  Chest wall muscle strain 07/12/2020  . Malignant neoplasm of bronchus of right upper lobe ([place]) 05/12/2020  . Mass of upper lobe of right lung 05/03/2020  . COVID-19 09/25/2019  . History of dysuria 09/24/2019  . Rhinosinusitis 08/27/2019  . Dysuria 05/25/2019  . Tachycardia 05/25/2019  . Hematuria 05/25/2019  . 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 Health care maintenance 03/25/2013  . Depression 12/03/2012  . Iron deficiency anemia 05/08/2012  . Insomnia 05/07/2012  . Weight loss 07/27/2009  . HLD (hyperlipidemia) 03/25/2008  . Constipation 11/04/2007  . Essential hypertension 09/24/2006  . Allergies 09/24/2006  . Asthma 09/24/2006  . GERD 09/24/2006    Past Surgical History:  Procedure Laterality Date  . ABDOMINAL HYSTERECTOMY  1980s  . BIOPSY  03/18/2019   Procedure: BIOPSY;  Surgeon: Rush Landmark Telford Nab., MD;  Location: [facility];  Service: Gastroenterology;;  . BRONCHIAL BIOPSY  05/03/2020   Procedure: BRONCHIAL BIOPSIES;  Surgeon: Collene Gobble, MD;  Location: [facility] ENDOSCOPY;  Service: Pulmonary;;  . BRONCHIAL BRUSHINGS  05/03/2020   Procedure: BRONCHIAL BRUSHINGS;  Surgeon: Collene Gobble, MD;  Location: [facility] ENDOSCOPY;  Service: Pulmonary;;  . BRONCHIAL NEEDLE ASPIRATION BIOPSY  05/03/2020   Procedure: BRONCHIAL NEEDLE ASPIRATION BIOPSIES;  Surgeon: Collene Gobble, MD;  Location: [facility] ENDOSCOPY;  Service: Pulmonary;;  .  CHOLECYSTECTOMY  01/06/09  . COLONOSCOPY  05/09/2012   Procedure: COLONOSCOPY;  Surgeon: Inda Castle, MD;  Location: [facility];  Service: Endoscopy;  Laterality: N/A;  . COLONOSCOPY WITH PROPOFOL N/A 03/18/2019   Procedure: COLONOSCOPY WITH PROPOFOL;  Surgeon: Rush Landmark Telford Nab., MD;  Location: [place];  Service: Gastroenterology;  Laterality: N/A;  . ENTEROSCOPY N/A 03/18/2019   Procedure: ENTEROSCOPY;  Surgeon: Rush Landmark Telford Nab., MD;  Location: [facility];  Service: Gastroenterology;   Laterality: N/A;  . ESOPHAGOGASTRODUODENOSCOPY  05/09/2012   Procedure: ESOPHAGOGASTRODUODENOSCOPY (EGD);  Surgeon: Inda Castle, MD;  Location: [place];  Service: Endoscopy;  Laterality: N/A;  . FIDUCIAL MARKER PLACEMENT  05/03/2020   Procedure: FIDUCIAL MARKER PLACEMENT;  Surgeon: Collene Gobble, MD;  Location: [facility] ENDOSCOPY;  Service: Pulmonary;;  . HEMOSTASIS CLIP PLACEMENT  03/18/2019   Procedure: HEMOSTASIS CLIP PLACEMENT;  Surgeon: Irving Copas., MD;  Location: [facility];  Service: Gastroenterology;;  . HOT HEMOSTASIS N/A 03/18/2019   Procedure: HOT HEMOSTASIS (ARGON PLASMA COAGULATION/BICAP);  Surgeon: Irving Copas., MD;  Location: [facility] [facility];  Service: Gastroenterology;  Laterality: N/A;  . POLYPECTOMY  03/18/2019   Procedure: POLYPECTOMY;  Surgeon: Rush Landmark Telford Nab., MD;  Location: [place];  Service: Gastroenterology;;  . [place] INJECTION  03/18/2019   Procedure: SUBMUCOSAL TATTOO INJECTION;  Surgeon: Irving Copas., MD;  Location: [place];  Service: Gastroenterology;;  . VIDEO BRONCHOSCOPY WITH ENDOBRONCHIAL NAVIGATION N/A 05/03/2020   Procedure: VIDEO BRONCHOSCOPY WITH ENDOBRONCHIAL NAVIGATION;  Surgeon: Collene Gobble, MD;  Location: [facility] ENDOSCOPY;  Service: Pulmonary;  Laterality: N/A;    OB History   No obstetric history on file.      Home Medications    Prior to Admission medications   Medication Sig Start Date End Date Taking? Authorizing Provider  ciprofloxacin (CIPRO) 250 MG tablet Take 1 tablet (250 mg total) by mouth 2 (two) times daily. 10/08/20  Yes Volney American, PA-C  pravastatin (PRAVACHOL) 40 MG tablet TAKE 1 TABLET BY MOUTH ONCE DAILY IN THE EVENING 08/25/20   Seawell, Jaimie A, DO  amLODipine (NORVASC) 5 MG tablet Take 1 tablet (5 mg total) by mouth daily. 08/10/20 08/10/21  Mosetta Anis, MD  fluticasone (FLONASE) 50 MCG/ACT nasal spray Use 1-2 sprays per nostril once daily as needed  for sinus congestion or allergy symptoms. 09/20/20   Jeralyn Bennett, MD  fluticasone (FLOVENT HFA) 110 MCG/ACT inhaler Inhale 2 puffs into the lungs 2 (two) times daily. 09/20/20   Jeralyn Bennett, MD  losartan (COZAAR) 25 MG tablet Take 1 tablet (25 mg total) by mouth daily. 08/10/20   Mosetta Anis, MD  methocarbamol (ROBAXIN) 500 MG tablet Take 1 tablet (500 mg total) by mouth 3 (three) times daily. 09/20/20   Jeralyn Bennett, MD  mirtazapine (REMERON) 7.5 MG tablet Take 1 tablet (7.5 mg total) by mouth at bedtime. 09/20/20   Jeralyn Bennett, MD  pantoprazole (PROTONIX) 40 MG tablet Take 1 tablet (40 mg total) by mouth daily. 09/06/20   Jeralyn Bennett, MD  ramelteon (ROZEREM) 8 MG tablet Take 1 tablet (8 mg total) by mouth at bedtime as needed for sleep. 09/06/20 09/06/21  Jeralyn Bennett, MD    Family History Family History  Problem Relation Age of Onset  . Hypertension Mother   . Colon cancer Neg Hx   . Esophageal cancer Neg Hx   . Inflammatory bowel disease Neg Hx   . Liver disease Neg Hx   . Pancreatic cancer Neg Hx   .  Rectal cancer Neg Hx   . Stomach cancer Neg Hx     Social History Social History   Tobacco Use  . Smoking status: Former Smoker    Types: Cigarettes    Quit date: 08/21/1995    Years since quitting: 25.1  . Smokeless tobacco: Never Used  . Tobacco comment: Patient states she stopped smoking in 31 yrs ago (as of 2022)  Vaping Use  . Vaping Use: Never used  Substance Use Topics  . Alcohol use: No    Comment: in remission since 2007  . Drug use: Not Currently    Comment: previously used marijuana      Allergies   Banana, Grape (artificial) flavor, Ibuprofen, and Penicillins   Review of Systems Review of Systems Per HPI  Physical Exam Triage Vital Signs ED Triage Vitals  Enc Vitals Group     BP 10/08/20 1106 (!) 146/83     Pulse Rate 10/08/20 1106 75     Resp 10/08/20 1106 17     Temp 10/08/20 1108 98 F (36.7 C)     Temp src --      SpO2  10/08/20 1106 100 %     Weight --      Height --      Head Circumference --      Peak Flow --      Pain Score 10/08/20 1101 0     Pain Loc --      Pain Edu? --      Excl. in [place]? --    No data found.  Updated Vital Signs BP (!) 146/83   Pulse 75   Temp 98 F (36.7 C)   Resp 17   LMP 08/21/1982   SpO2 100%   Visual Acuity Right Eye Distance:   Left Eye Distance:   Bilateral Distance:    Right Eye Near:   Left Eye Near:    Bilateral Near:     Physical Exam Vitals and nursing note reviewed.  Constitutional:      Comments: Underweight  HENT:     Head: Atraumatic.     Mouth/Throat:     Mouth: Mucous membranes are dry.  Eyes:     Extraocular Movements: Extraocular movements intact.     Conjunctiva/sclera: Conjunctivae normal.  Cardiovascular:     Rate and Rhythm: Normal rate and regular rhythm.     Heart sounds: Normal heart sounds.  Pulmonary:     Effort: Pulmonary effort is normal.     Breath sounds: Normal breath sounds. No wheezing or rales.  Abdominal:     General: Bowel sounds are normal. There is no distension.     Palpations: Abdomen is soft.     Tenderness: There is no abdominal tenderness. There is no right CVA tenderness, left CVA tenderness or guarding.  Musculoskeletal:        General: Normal range of motion.     Cervical back: Normal range of motion and neck supple.  Skin:    General: Skin is warm and dry.  Neurological:     Motor: No weakness.     Gait: Gait normal.  Psychiatric:        Mood and Affect: Mood normal.        Thought Content: Thought content normal.        Judgment: Judgment normal.    UC Treatments / Results  Labs (all labs ordered are listed, but only abnormal results are displayed) Labs Reviewed  POCT URINALYSIS DIPSTICK, ED /  UC - Abnormal; Notable for the following components:      Result Value   Leukocytes,Ua TRACE (*)    All other components within normal limits  URINE CULTURE  POCT URINALYSIS DIPSTICK, ED / UC     EKG   Radiology No results found.  Procedures Procedures (including critical care time)  Medications Ordered in UC Medications - No data to display  Initial Impression / Assessment and Plan / UC Course  I have reviewed the triage vital signs and the nursing notes.  Pertinent labs & imaging results that were available during my care of the patient were reviewed by me and considered in my medical decision making (see chart for details).     UA today showing trace leukocytes but otherwise benign, vitals and exam reassuring.  Will treat with Cipro for possible UTI given symptoms while awaiting culture results.  Discussed increasing water intake, urinating fully and frequently.  Return for acutely worsening symptoms.  Final Clinical Impressions(s) / UC Diagnoses   Final diagnoses:  Acute cystitis without hematuria   Discharge Instructions   None    ED Prescriptions    Medication Sig Dispense Auth. Provider   ciprofloxacin (CIPRO) 250 MG tablet Take 1 tablet (250 mg total) by mouth 2 (two) times daily. 6 tablet Volney American, [place]     PDMP not reviewed this encounter.   Volney American, [place] 10/08/20 1149

## 2020-10-08 NOTE — ED Triage Notes (Signed)
Pt in with c/o urinary frequency and burning when she urinates for a few days

## 2020-10-09 LAB — URINE CULTURE

## 2020-10-21 ENCOUNTER — Emergency Department (HOSPITAL_COMMUNITY): Payer: Medicare Other

## 2020-10-21 ENCOUNTER — Encounter (HOSPITAL_COMMUNITY): Payer: Self-pay

## 2020-10-21 ENCOUNTER — Emergency Department (HOSPITAL_COMMUNITY)
Admission: EM | Admit: 2020-10-21 | Discharge: 2020-10-21 | Disposition: A | Discharge status: Home / Self Care | Payer: Medicare Other | Attending: Emergency Medicine | Admitting: Emergency Medicine

## 2020-10-21 DIAGNOSIS — Z5321 Procedure and treatment not carried out due to patient leaving prior to being seen by health care provider: Secondary | ICD-10-CM | POA: Diagnosis not present

## 2020-10-21 DIAGNOSIS — R200 Anesthesia of skin: Secondary | ICD-10-CM | POA: Diagnosis present

## 2020-10-21 LAB — BASIC METABOLIC PANEL
Anion gap: 9 (ref 5–15)
BUN: 10 mg/dL (ref 8–23)
CO2: 25 mmol/L (ref 22–32)
Calcium: 9.1 mg/dL (ref 8.9–10.3)
Chloride: 106 mmol/L (ref 98–111)
Creatinine, Ser: 0.78 mg/dL (ref 0.44–1.00)
GFR, Estimated: >60 mL/min (ref >60)
Glucose, Bld: 102 mg/dL — ABNORMAL HIGH (ref 70–99)
Potassium: 2.9 mmol/L — ABNORMAL LOW (ref 3.5–5.1)
Sodium: 140 mmol/L (ref 135–145)

## 2020-10-21 LAB — URINALYSIS, ROUTINE W REFLEX MICROSCOPIC
Bilirubin Urine: NEGATIVE
Glucose, UA: NEGATIVE mg/dL
Hgb urine dipstick: NEGATIVE
Ketones, ur: NEGATIVE mg/dL
Nitrite: NEGATIVE
Protein, ur: NEGATIVE mg/dL
Specific Gravity, Urine: 1.02 (ref 1.005–1.030)
pH: 5 (ref 5.0–8.0)

## 2020-10-21 LAB — CBC WITH DIFFERENTIAL/PLATELET
Abs Immature Granulocytes: 0.05 10*3/uL (ref 0.00–0.07)
Basophils Absolute: 0 10*3/uL (ref 0.0–0.1)
Basophils Relative: 0 %
Eosinophils Absolute: 0.2 10*3/uL (ref 0.0–0.5)
Eosinophils Relative: 2 %
HCT: 39.2 % (ref 36.0–46.0)
Hemoglobin: 12.3 g/dL (ref 12.0–15.0)
Immature Granulocytes: 1 %
Lymphocytes Relative: 8 %
Lymphs Abs: 0.9 10*3/uL (ref 0.7–4.0)
MCH: 31 pg (ref 26.0–34.0)
MCHC: 31.4 g/dL (ref 30.0–36.0)
MCV: 98.7 fL (ref 80.0–100.0)
Monocytes Absolute: 0.7 10*3/uL (ref 0.1–1.0)
Monocytes Relative: 6 %
Neutro Abs: 9.1 10*3/uL — ABNORMAL HIGH (ref 1.7–7.7)
Neutrophils Relative %: 83 %
Platelets: 317 10*3/uL (ref 150–400)
RBC: 3.97 MIL/uL (ref 3.87–5.11)
RDW: 11.7 % (ref 11.5–15.5)
WBC: 10.9 10*3/uL — ABNORMAL HIGH (ref 4.0–10.5)
nRBC: 0 % (ref 0.0–0.2)

## 2020-10-21 NOTE — ED Provider Notes (Signed)
Emergency Medicine Provider Triage Evaluation Note  Kimberly [name] , a 65 y.o. female  was evaluated in triage.  Pt complains of left sided numbness x months. Chronic. Feels started after arm was elevated for procedure as well as starting new mediations. No facial droop. No trauma, HA, weakness, pain.  Review of Systems  Positive: numbness Negative: Weakness, pain, CP, Sob, HA  Physical Exam  LMP 08/21/1982  Gen:   Awake, no distress   Resp:  Normal effort  MSK:   Moves extremities without difficulty. No tenderness Neuro:  Equal grip. CN 2-12 grossly intact. Intact BL sensation to Upper and Lower extremities Other:    Medical Decision Making  Medically screening exam initiated at 12:38 PM.  Appropriate orders placed.  Kimberly [name] was informed that the remainder of the evaluation will be completed by another provider, this initial triage assessment does not replace that evaluation, and the importance of remaining in the ED until their evaluation is complete.  Left subjective numbness x months     Pratik Dalziel A, PA-C 10/21/20 1241    Varney Biles, MD 10/24/20 1540

## 2020-10-21 NOTE — ED Notes (Signed)
LWBS 

## 2020-10-21 NOTE — ED Triage Notes (Signed)
Pt complains of left sided numbness x months. Chronic. Feels started after arm was elevated for procedure as well as starting new mediations. No facial droop. No trauma, HA, weakness, pain.

## 2020-10-31 ENCOUNTER — Telehealth: Payer: Self-pay | Admitting: *Deleted

## 2020-10-31 ENCOUNTER — Encounter (HOSPITAL_COMMUNITY): Payer: Self-pay

## 2020-10-31 ENCOUNTER — Ambulatory Visit (HOSPITAL_COMMUNITY)
Admission: EM | Admit: 2020-10-31 | Discharge: 2020-10-31 | Disposition: A | Discharge status: Home / Self Care | Payer: Medicare Other | Attending: Emergency Medicine | Admitting: Emergency Medicine

## 2020-10-31 DIAGNOSIS — R202 Paresthesia of skin: Secondary | ICD-10-CM | POA: Diagnosis not present

## 2020-10-31 DIAGNOSIS — R200 Anesthesia of skin: Secondary | ICD-10-CM | POA: Diagnosis not present

## 2020-10-31 MED ORDER — GABAPENTIN 300 MG PO CAPS: 300.0000 mg | ORAL_CAPSULE | Freq: Every day | ORAL | 1 refills | Status: DC

## 2020-10-31 NOTE — Telephone Encounter (Signed)
Left a voicemail letting her know that I discussed her symptoms with the PA and she would like for her to follow up with ortho and she will also send in a referral to PT.  Left my call back number [phone] for questions or concerns.  Gloriajean Dell. Leonie Green, BSN

## 2020-10-31 NOTE — ED Triage Notes (Signed)
Pt presents with left sided numbness and tingling that has been going on all weekend; pt states she feels jittery.

## 2020-10-31 NOTE — ED Provider Notes (Signed)
[facility]    CSN: [id number] Arrival date & time: 10/31/20  0930      History   Chief Complaint Chief Complaint  Patient presents with  . Numbness & Tingling    HPI Kimberly [name] is a 65 y.o. female.   Patient present with tingling of left face, left arm and left leg that has been present since May 19th. Denies numbness. Had left eye cataract surgery on 5/63 with no complications. Tingling began that afternoon. Discussed symptoms with opthalmology at follow up who suggested possible lingering anesthesia. Symptoms has persisted with no change. Denies weakness, speech changes, memory changes, visual changes, dizziness, lightheadedness, headaches. Seen in ED on 5/27 for symptoms. All work up inconclusive.   Past Medical History:  Diagnosis Date  . Alcohol abuse, in remission    since around 2005  . Alcoholic hepatitis    fatty liver on imaging- Treated Hepatits C and treated  . Allergic rhinitis   . Anemia    EGD with duodenal AVM, normal colonoscopy 04/2012  . Asthma   . Callus of foot 2009  . Cancer ([place])   . Chest pain, musculoskeletal 2011  . Cholelithiasis    [place] 09/2008: Cholelithiasis is present, s/p cholecystectomy  . Depression   . Excessive cerumen in ear canal    since before 2000  . GERD (gastroesophageal reflux disease)   . History of blood transfusion   . Hyperlipidemia   . Hypertension   . Menopausal syndrome    Treated with estradiol in the past - discontinued 04/2012  . MENOPAUSAL SYNDROME 09/24/2006   2008 Note : The pt is adament about having this medication.  She fully understands the increased risk of heart disease and cancer that is associated with HRT and is willing to accept this risk in order to prevent the debilitating hot flashes she has off this medication.  Will continue to encourage her to try to wean herself off of this medication at our next visit.  2009 note indicated that she tr  . Otitis externa, acute Feb 2012   bilateral,  treated with azithromycin after failure of neomycin drops  . Otitis media, acute Feb 2012  . PONV (postoperative nausea and vomiting)     Patient Active Problem List   Diagnosis Date Noted  . Left-sided chest wall pain 07/12/2020  . Chest wall muscle strain 07/12/2020  . 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 Mass of upper lobe of right lung 05/03/2020  . COVID-19 09/25/2019  . History of dysuria 09/24/2019  . Rhinosinusitis 08/27/2019  . Dysuria 05/25/2019  . Tachycardia 05/25/2019  . Hematuria 05/25/2019  . 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 Health care maintenance 03/25/2013  . Depression 12/03/2012  . Iron deficiency anemia 05/08/2012  . Insomnia 05/07/2012  . Weight loss 07/27/2009  . HLD (hyperlipidemia) 03/25/2008  . Constipation 11/04/2007  . Essential hypertension 09/24/2006  . Allergies 09/24/2006  . Asthma 09/24/2006  . GERD 09/24/2006    Past Surgical History:  Procedure Laterality Date  . ABDOMINAL HYSTERECTOMY  1980s  . BIOPSY  03/18/2019   Procedure: BIOPSY;  Surgeon: Rush Landmark Telford Nab., MD;  Location: [place];  Service: Gastroenterology;;  . BRONCHIAL BIOPSY  05/03/2020   Procedure: BRONCHIAL BIOPSIES;  Surgeon: Collene Gobble, MD;  Location: [facility] ENDOSCOPY;  Service: Pulmonary;;  . BRONCHIAL BRUSHINGS  05/03/2020   Procedure: BRONCHIAL BRUSHINGS;  Surgeon: Collene Gobble, MD;  Location: [facility]  ENDOSCOPY;  Service: Pulmonary;;  . BRONCHIAL NEEDLE ASPIRATION BIOPSY  05/03/2020   Procedure: BRONCHIAL NEEDLE ASPIRATION BIOPSIES;  Surgeon: Collene Gobble, MD;  Location[facility] ENDOSCOPY;  Service: Pulmonary;;  . CHOLECYSTECTOMY  01/06/09  . COLONOSCOPY  05/09/2012   Procedure: COLONOSCOPY;  Surgeon: Inda Castle, MD;  Location: [place];  Service: Endoscopy;  Laterality: N/A;  . COLONOSCOPY WITH PROPOFOL N/A 03/18/2019   Procedure: COLONOSCOPY WITH PROPOFOL;  Surgeon: Rush Landmark  Telford Nab., MD;  Location: [place];  Service: Gastroenterology;  Laterality: N/A;  . ENTEROSCOPY N/A 03/18/2019   Procedure: ENTEROSCOPY;  Surgeon: Rush Landmark Telford Nab., MD;  Location: [place];  Service: Gastroenterology;  Laterality: N/A;  . ESOPHAGOGASTRODUODENOSCOPY  05/09/2012   Procedure: ESOPHAGOGASTRODUODENOSCOPY (EGD);  Surgeon: Inda Castle, MD;  Location: [place];  Service: Endoscopy;  Laterality: N/A;  . FIDUCIAL MARKER PLACEMENT  05/03/2020   Procedure: FIDUCIAL MARKER PLACEMENT;  Surgeon: Collene Gobble, MD;  Location: [facility] ENDOSCOPY;  Service: Pulmonary;;  . HEMOSTASIS CLIP PLACEMENT  03/18/2019   Procedure: HEMOSTASIS CLIP PLACEMENT;  Surgeon: Irving Copas., MD;  Location: [place];  Service: Gastroenterology;;  . HOT HEMOSTASIS N/A 03/18/2019   Procedure: HOT HEMOSTASIS (ARGON PLASMA COAGULATION/BICAP);  Surgeon: Irving Copas., MD;  Location: [place];  Service: Gastroenterology;  Laterality: N/A;  . POLYPECTOMY  03/18/2019   Procedure: POLYPECTOMY;  Surgeon: Rush Landmark Telford Nab., MD;  Location: [place];  Service: Gastroenterology;;  . [place] INJECTION  03/18/2019   Procedure: SUBMUCOSAL TATTOO INJECTION;  Surgeon: Irving Copas., MD;  Location: [place];  Service: Gastroenterology;;  . VIDEO BRONCHOSCOPY WITH ENDOBRONCHIAL NAVIGATION N/A 05/03/2020   Procedure: VIDEO BRONCHOSCOPY WITH ENDOBRONCHIAL NAVIGATION;  Surgeon: Collene Gobble, MD;  Location: [place] ENDOSCOPY;  Service: Pulmonary;  Laterality: N/A;    OB History   No obstetric history on file.      Home Medications    Prior to Admission medications   Medication Sig Start Date End Date Taking? Authorizing Provider  gabapentin (NEURONTIN) 300 MG capsule Take 1 capsule (300 mg total) by mouth at bedtime. 10/31/20  Yes Dandrea Medders R, NP  pravastatin (PRAVACHOL) 40 MG tablet TAKE 1 TABLET BY MOUTH ONCE DAILY IN THE EVENING 08/25/20    Seawell, Jaimie A, DO  amLODipine (NORVASC) 5 MG tablet Take 1 tablet (5 mg total) by mouth daily. 08/10/20 08/10/21  Mosetta Anis, MD  ciprofloxacin (CIPRO) 250 MG tablet Take 1 tablet (250 mg total) by mouth 2 (two) times daily. 10/08/20   Volney American, PA-C  fluticasone [facility]) 50 MCG/ACT nasal spray Use 1-2 sprays per nostril once daily as needed for sinus congestion or allergy symptoms. 09/20/20   Jeralyn Bennett, MD  fluticasone (FLOVENT HFA) 110 MCG/ACT inhaler Inhale 2 puffs into the lungs 2 (two) times daily. 09/20/20   Jeralyn Bennett, MD  losartan (COZAAR) 25 MG tablet Take 1 tablet (25 mg total) by mouth daily. 08/10/20   Mosetta Anis, MD  methocarbamol (ROBAXIN) 500 MG tablet Take 1 tablet (500 mg total) by mouth 3 (three) times daily. 09/20/20   Jeralyn Bennett, MD  mirtazapine (REMERON) 7.5 MG tablet Take 1 tablet (7.5 mg total) by mouth at bedtime. 09/20/20   Jeralyn Bennett, MD  pantoprazole (PROTONIX) 40 MG tablet Take 1 tablet (40 mg total) by mouth daily. 09/06/20   Jeralyn Bennett, MD  ramelteon (ROZEREM) 8 MG tablet Take 1 tablet (8 mg total) by mouth at bedtime as needed for sleep. 09/06/20 09/06/21  Jeralyn Bennett, MD    Family History Family History  Problem Relation Age of Onset  . Hypertension Mother   . Colon cancer Neg Hx   . Esophageal cancer Neg Hx   . Inflammatory bowel disease Neg Hx   . Liver disease Neg Hx   . Pancreatic cancer Neg Hx   . Rectal cancer Neg Hx   . Stomach cancer Neg Hx     Social History Social History   Tobacco Use  . Smoking status: Former Smoker    Types: Cigarettes    Quit date: 08/21/1995    Years since quitting: 25.2  . Smokeless tobacco: Never Used  . Tobacco comment: Patient states she stopped smoking in 31 yrs ago (as of 2022)  Vaping Use  . Vaping Use: Never used  Substance Use Topics  . Alcohol use: No    Comment: in remission since 2007  . Drug use: Not Currently    Comment: previously used marijuana       Allergies   Banana, Grape (artificial) flavor, Ibuprofen, and Penicillins   Review of Systems Review of Systems  Defer to HPI    Physical Exam Triage Vital Signs ED Triage Vitals  Enc Vitals Group     BP 10/31/20 1001 128/66     Pulse Rate 10/31/20 1001 90     Resp 10/31/20 1001 18     Temp 10/31/20 1001 98.1 F (36.7 C)     Temp Source 10/31/20 1001 Oral     SpO2 10/31/20 1001 99 %     Weight --      Height --      Head Circumference --      Peak Flow --      Pain Score 10/31/20 0959 0     Pain Loc --      Pain Edu? --      Excl. in [place]? --    No data found.  Updated Vital Signs BP 128/66 (BP Location: Left Arm)   Pulse 90   Temp 98.1 F (36.7 C) (Oral)   Resp 18   LMP 08/21/1982   SpO2 99%   Visual Acuity Right Eye Distance:   Left Eye Distance:   Bilateral Distance:    Right Eye Near:   Left Eye Near:    Bilateral Near:     Physical Exam Constitutional:      Appearance: Normal appearance. She is normal weight.  HENT:     Head: Normocephalic.  Cardiovascular:     Rate and Rhythm: Normal rate and regular rhythm.     Pulses: Normal pulses.     Heart sounds: Normal heart sounds.  Pulmonary:     Effort: Pulmonary effort is normal.     Breath sounds: Normal breath sounds.  Musculoskeletal:     Cervical back: Normal range of motion and neck supple.  Neurological:     Mental Status: She is alert and oriented to person, place, and time. Mental status is at baseline.     Cranial Nerves: Cranial nerves are intact.     Sensory: Sensation is intact.     Motor: Motor function is intact.     Coordination: Coordination is intact.     Gait: Gait is intact.     Deep Tendon Reflexes: Reflexes are normal and symmetric.      UC Treatments / Results  Labs (all labs ordered are listed, but only abnormal results are displayed) Labs Reviewed - No data to display  EKG  Radiology No results found.  Procedures Procedures (including critical care  time)  Medications Ordered in UC Medications - No data to display  Initial Impression / Assessment and Plan / UC Course  I have reviewed the triage vital signs and the nursing notes.  Pertinent labs & imaging results that were available during my care of the patient were reviewed by me and considered in my medical decision making (see chart for details).  Tingling of left side of face, upper and lower extremity  All lab work and imaging reviewed, per patient she had lab work completed last week, provider said it looks good but she was recently started on B12 and vitamin D, unknown dosage, lab values not available, symptoms started prior to addition of medication. History of neoplasm of right upper lobe, completed chemo/radiation in 01/22. Patient called oncologist while in office, awaiting return phone call.   1. Gabapentin 300 mg daily at bedtime 2. Neurology referral given Final Clinical Impressions(s) / UC Diagnoses   Final diagnoses:  Tingling of left upper extremity and left side of face  Numbness and tingling of left lower extremity     Discharge Instructions     Take one pill at bedtime daily,  Make appointment with neurologist, information listed below    ED Prescriptions    Medication Sig Dispense Auth. Provider   gabapentin (NEURONTIN) 300 MG capsule Take 1 capsule (300 mg total) by mouth at bedtime. 30 capsule Hans Eden, NP     PDMP not reviewed this encounter.   Hans Eden, NP 10/31/20 1050

## 2020-10-31 NOTE — Discharge Instructions (Addendum)
Take one pill at bedtime daily,  Make appointment with neurologist, information listed below

## 2020-11-01 ENCOUNTER — Other Ambulatory Visit: Payer: Self-pay

## 2020-11-01 ENCOUNTER — Emergency Department (HOSPITAL_COMMUNITY)
Admission: EM | Admit: 2020-11-01 | Discharge: 2020-11-01 | Discharge status: Against Medical Advice | Payer: Medicare Other | Attending: Emergency Medicine | Admitting: Emergency Medicine

## 2020-11-01 ENCOUNTER — Emergency Department (HOSPITAL_COMMUNITY): Payer: Medicare Other

## 2020-11-01 DIAGNOSIS — Z5321 Procedure and treatment not carried out due to patient leaving prior to being seen by health care provider: Secondary | ICD-10-CM | POA: Diagnosis not present

## 2020-11-01 DIAGNOSIS — R200 Anesthesia of skin: Secondary | ICD-10-CM | POA: Diagnosis not present

## 2020-11-01 DIAGNOSIS — R42 Dizziness and giddiness: Secondary | ICD-10-CM | POA: Insufficient documentation

## 2020-11-01 LAB — I-STAT CHEM 8, ED
BUN: 10 mg/dL (ref 8–23)
Calcium, Ion: 1.15 mmol/L (ref 1.15–1.40)
Chloride: 102 mmol/L (ref 98–111)
Creatinine, Ser: 0.6 mg/dL (ref 0.44–1.00)
Glucose, Bld: 109 mg/dL — ABNORMAL HIGH (ref 70–99)
HCT: 40 % (ref 36.0–46.0)
Hemoglobin: 13.6 g/dL (ref 12.0–15.0)
Potassium: 4.2 mmol/L (ref 3.5–5.1)
Sodium: 139 mmol/L (ref 135–145)
TCO2: 26 mmol/L (ref 22–32)

## 2020-11-01 LAB — DIFFERENTIAL
Abs Immature Granulocytes: 0.01 10*3/uL (ref 0.00–0.07)
Basophils Absolute: 0.1 10*3/uL (ref 0.0–0.1)
Basophils Relative: 1 %
Eosinophils Absolute: 0.1 10*3/uL (ref 0.0–0.5)
Eosinophils Relative: 1 %
Immature Granulocytes: 0 %
Lymphocytes Relative: 23 %
Lymphs Abs: 1.2 10*3/uL (ref 0.7–4.0)
Monocytes Absolute: 0.4 10*3/uL (ref 0.1–1.0)
Monocytes Relative: 8 %
Neutro Abs: 3.4 10*3/uL (ref 1.7–7.7)
Neutrophils Relative %: 67 %

## 2020-11-01 LAB — COMPREHENSIVE METABOLIC PANEL
ALT: 9 U/L (ref 0–44)
AST: 19 U/L (ref 15–41)
Albumin: 4.1 g/dL (ref 3.5–5.0)
Alkaline Phosphatase: 58 U/L (ref 38–126)
Anion gap: 8 (ref 5–15)
BUN: 11 mg/dL (ref 8–23)
CO2: 28 mmol/L (ref 22–32)
Calcium: 9.5 mg/dL (ref 8.9–10.3)
Chloride: 103 mmol/L (ref 98–111)
Creatinine, Ser: 0.74 mg/dL (ref 0.44–1.00)
GFR, Estimated: >60 mL/min (ref >60)
Glucose, Bld: 113 mg/dL — ABNORMAL HIGH (ref 70–99)
Potassium: 4.4 mmol/L (ref 3.5–5.1)
Sodium: 139 mmol/L (ref 135–145)
Total Bilirubin: 1 mg/dL (ref 0.3–1.2)
Total Protein: 6.9 g/dL (ref 6.5–8.1)

## 2020-11-01 LAB — PROTIME-INR
INR: 1 (ref 0.8–1.2)
Prothrombin Time: 12.8 seconds (ref 11.4–15.2)

## 2020-11-01 LAB — CBC
HCT: 40.7 % (ref 36.0–46.0)
Hemoglobin: 12.9 g/dL (ref 12.0–15.0)
MCH: 30.9 pg (ref 26.0–34.0)
MCHC: 31.7 g/dL (ref 30.0–36.0)
MCV: 97.4 fL (ref 80.0–100.0)
Platelets: 287 10*3/uL (ref 150–400)
RBC: 4.18 MIL/uL (ref 3.87–5.11)
RDW: 11.6 % (ref 11.5–15.5)
WBC: 5.1 10*3/uL (ref 4.0–10.5)
nRBC: 0 % (ref 0.0–0.2)

## 2020-11-01 LAB — ETHANOL: Alcohol, Ethyl (B): <10 mg/dL (ref <10)

## 2020-11-01 LAB — APTT: aPTT: 33 seconds (ref 24–36)

## 2020-11-01 NOTE — ED Provider Notes (Signed)
Emergency Medicine Provider Triage Evaluation Note  Kimberly [name] , a 65 y.o. female  was evaluated in triage.  Pt complains of left sided numbness x 2 days. Was seen at [facility] yesterday for it and given gabapentin. Worse to left arm and left leg. No weakness, no gait changes, no history of strokes/TIA/MI, not on blood thinners.   Review of Systems  Positive: Numbness, headache, dizzy  Negative: Chest pain, gait changes   Physical Exam  BP 132/81 (BP Location: Right Arm)   Pulse 82   Temp 98.2 F (36.8 C)   Resp 16   LMP 08/21/1982   SpO2 99%  Gen:   Awake, no distress   Resp:  Normal effort  MSK:   Moves extremities without difficulty  Other:  Strength 5/5 bilaterally UE and LE. CN III-XII grossly in tact. Reports sensation changes but has sensation to light touch bilaterally to UE and LE.   Medical Decision Making  Medically screening exam initiated at 10:27 AM.  Appropriate orders placed.  Kimberly [name] was informed that the remainder of the evaluation will be completed by another provider, this initial triage assessment does not replace that evaluation, and the importance of remaining in the ED until their evaluation is complete.    Sherrill Raring, PA-C 11/01/20 1028    Charlesetta Shanks, MD 11/08/20 2131

## 2020-11-01 NOTE — ED Notes (Signed)
Pt stated that she was going home. She stated her PCP made an appointment for her with a neurologist. Moving pt OTF.

## 2020-11-01 NOTE — ED Notes (Signed)
Patient transported to CT 

## 2020-11-01 NOTE — ED Triage Notes (Signed)
Pt reports L sided numbness x 2 days. Seen at [facility] for same yesterday and given gabapentin and neurology f/u but has not made appointment. Endorses intermittent dizziness.

## 2020-11-03 ENCOUNTER — Emergency Department (HOSPITAL_COMMUNITY)
Admission: EM | Admit: 2020-11-03 | Discharge: 2020-11-03 | Disposition: A | Discharge status: Home / Self Care | Payer: Medicare Other | Attending: Emergency Medicine | Admitting: Emergency Medicine

## 2020-11-03 ENCOUNTER — Emergency Department (HOSPITAL_COMMUNITY): Payer: Medicare Other

## 2020-11-03 DIAGNOSIS — Z8616 Personal history of COVID-19: Secondary | ICD-10-CM | POA: Insufficient documentation

## 2020-11-03 DIAGNOSIS — Z859 Personal history of malignant neoplasm, unspecified: Secondary | ICD-10-CM | POA: Insufficient documentation

## 2020-11-03 DIAGNOSIS — Z79899 Other long term (current) drug therapy: Secondary | ICD-10-CM | POA: Insufficient documentation

## 2020-11-03 DIAGNOSIS — Z7951 Long term (current) use of inhaled steroids: Secondary | ICD-10-CM | POA: Diagnosis not present

## 2020-11-03 DIAGNOSIS — Z20822 Contact with and (suspected) exposure to covid-19: Secondary | ICD-10-CM | POA: Insufficient documentation

## 2020-11-03 DIAGNOSIS — Z87891 Personal history of nicotine dependence: Secondary | ICD-10-CM | POA: Diagnosis not present

## 2020-11-03 DIAGNOSIS — I10 Essential (primary) hypertension: Secondary | ICD-10-CM | POA: Diagnosis not present

## 2020-11-03 DIAGNOSIS — J45909 Unspecified asthma, uncomplicated: Secondary | ICD-10-CM | POA: Insufficient documentation

## 2020-11-03 DIAGNOSIS — R202 Paresthesia of skin: Secondary | ICD-10-CM | POA: Diagnosis present

## 2020-11-03 LAB — CBC WITH DIFFERENTIAL/PLATELET
Abs Immature Granulocytes: 0.02 10*3/uL (ref 0.00–0.07)
Basophils Absolute: 0.1 10*3/uL (ref 0.0–0.1)
Basophils Relative: 1 %
Eosinophils Absolute: 0.1 10*3/uL (ref 0.0–0.5)
Eosinophils Relative: 1 %
HCT: 41.2 % (ref 36.0–46.0)
Hemoglobin: 12.9 g/dL (ref 12.0–15.0)
Immature Granulocytes: 0 %
Lymphocytes Relative: 15 %
Lymphs Abs: 1 10*3/uL (ref 0.7–4.0)
MCH: 31 pg (ref 26.0–34.0)
MCHC: 31.3 g/dL (ref 30.0–36.0)
MCV: 99 fL (ref 80.0–100.0)
Monocytes Absolute: 0.4 10*3/uL (ref 0.1–1.0)
Monocytes Relative: 7 %
Neutro Abs: 4.9 10*3/uL (ref 1.7–7.7)
Neutrophils Relative %: 76 %
Platelets: 279 10*3/uL (ref 150–400)
RBC: 4.16 MIL/uL (ref 3.87–5.11)
RDW: 11.8 % (ref 11.5–15.5)
WBC: 6.4 10*3/uL (ref 4.0–10.5)
nRBC: 0 % (ref 0.0–0.2)

## 2020-11-03 LAB — BASIC METABOLIC PANEL
Anion gap: 11 (ref 5–15)
BUN: 8 mg/dL (ref 8–23)
CO2: 25 mmol/L (ref 22–32)
Calcium: 9 mg/dL (ref 8.9–10.3)
Chloride: 102 mmol/L (ref 98–111)
Creatinine, Ser: 0.74 mg/dL (ref 0.44–1.00)
GFR, Estimated: >60 mL/min (ref >60)
Glucose, Bld: 99 mg/dL (ref 70–99)
Potassium: 3.7 mmol/L (ref 3.5–5.1)
Sodium: 138 mmol/L (ref 135–145)

## 2020-11-03 LAB — RESP PANEL BY RT-PCR (FLU A&B, COVID) ARPGX2
Influenza A by PCR: NEGATIVE
Influenza B by PCR: NEGATIVE
SARS Coronavirus 2 by RT PCR: NEGATIVE

## 2020-11-03 NOTE — ED Provider Notes (Signed)
[place] EMERGENCY DEPARTMENT Provider Note   CSN: [id number] Arrival date & time: 11/03/20  3875     History Chief Complaint  Patient presents with   Numbness    Kimberly [name] is a 65 y.o. female.  65 year female with prior medical history as detailed below presents for evaluation.  Patient complains of left-sided tingling.  This is been ongoing for at least 1 to 2 months.  She complains of tingling sensation to the left scalp, left face, left arm and left leg.  Tingling sensation is constant.  She denies associated weakness.  She denies associated chest pain or shortness of breath.  She denies fevers.  She apparently has been seen by her PCP for same complaint.   The history is provided by the patient and medical records.  Illness Location:  Left sided paresthesia Severity:  Mild Onset quality:  Gradual Duration:  3 months Timing:  Constant Progression:  Unchanged Chronicity:  New     Past Medical History:  Diagnosis Date   Alcohol abuse, in remission    since around 6433   Alcoholic hepatitis    fatty liver on imaging- Treated Hepatits C and treated   Allergic rhinitis    Anemia    EGD with duodenal AVM, normal colonoscopy 04/2012   Asthma    Callus of foot 2009   Cancer [facility])    Chest pain, musculoskeletal 2011   Cholelithiasis    [place] 09/2008: Cholelithiasis is present, s/p cholecystectomy   Depression    Excessive cerumen in ear canal    since before 2000   GERD (gastroesophageal reflux disease)    History of blood transfusion    Hyperlipidemia    Hypertension    Menopausal syndrome    Treated with estradiol in the past - discontinued 04/2012   MENOPAUSAL SYNDROME 09/24/2006   2008 Note : The pt is adament about having this medication.  She fully understands the increased risk of heart disease and cancer that is associated with HRT and is willing to accept this risk in order to prevent the debilitating hot flashes she has off this  medication.  Will continue to encourage her to try to wean herself off of this medication at our next visit.  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 ([place]) 05/12/2020   Mass of upper lobe of right lung 05/03/2020   COVID-19 09/25/2019   History of dysuria 09/24/2019   Rhinosinusitis 08/27/2019   Dysuria 05/25/2019   Tachycardia 05/25/2019   Hematuria 05/25/2019   AVM (arteriovenous malformation) of small bowel, 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   Health care maintenance 03/25/2013   Depression 12/03/2012   Iron deficiency anemia 05/08/2012   Insomnia 05/07/2012   Weight loss 07/27/2009   HLD (hyperlipidemia) 03/25/2008   Constipation 11/04/2007   Essential hypertension 09/24/2006   Allergies 09/24/2006   Asthma 09/24/2006   GERD 09/24/2006    Past Surgical History:  Procedure Laterality Date   ABDOMINAL HYSTERECTOMY  1980s   BIOPSY  03/18/2019   Procedure: BIOPSY;  Surgeon: Irving Copas., MD;  Location: [place];  Service:  Gastroenterology;;   BRONCHIAL BIOPSY  05/03/2020   Procedure: BRONCHIAL BIOPSIES;  Surgeon: Collene Gobble, MD;  Location: [facility] ENDOSCOPY;  Service: Pulmonary;;   BRONCHIAL BRUSHINGS  05/03/2020   Procedure: BRONCHIAL BRUSHINGS;  Surgeon: Collene Gobble, MD;  Location: [facility] ENDOSCOPY;  Service: Pulmonary;;   BRONCHIAL NEEDLE ASPIRATION BIOPSY  05/03/2020   Procedure: BRONCHIAL NEEDLE ASPIRATION BIOPSIES;  Surgeon: Collene Gobble, MD;  Location: [facility] ENDOSCOPY;  Service: Pulmonary;;   CHOLECYSTECTOMY  01/06/09   COLONOSCOPY  05/09/2012   Procedure: COLONOSCOPY;  Surgeon: Inda Castle, MD;  Location: [place];  Service: Endoscopy;  Laterality: N/A;   COLONOSCOPY WITH PROPOFOL N/A 03/18/2019   Procedure: COLONOSCOPY WITH PROPOFOL;  Surgeon: Rush Landmark Telford Nab., MD;  Location: [place];  Service: Gastroenterology;  Laterality: N/A;   ENTEROSCOPY N/A 03/18/2019   Procedure: ENTEROSCOPY;  Surgeon: Rush Landmark Telford Nab., MD;  Location: [place];  Service: Gastroenterology;  Laterality: N/A;   ESOPHAGOGASTRODUODENOSCOPY  05/09/2012   Procedure: ESOPHAGOGASTRODUODENOSCOPY (EGD);  Surgeon: Inda Castle, MD;  Location: [place];  Service: Endoscopy;  Laterality: N/A;   FIDUCIAL MARKER PLACEMENT  05/03/2020   Procedure: FIDUCIAL MARKER PLACEMENT;  Surgeon: Collene Gobble, MD;  Location: [facility] ENDOSCOPY;  Service: Pulmonary;;   HEMOSTASIS CLIP PLACEMENT  03/18/2019   Procedure: HEMOSTASIS CLIP PLACEMENT;  Surgeon: Irving Copas., MD;  Location: [place];  Service: Gastroenterology;;   HOT HEMOSTASIS N/A 03/18/2019   Procedure: HOT HEMOSTASIS (ARGON PLASMA COAGULATION/BICAP);  Surgeon: Irving Copas., MD;  Location: [place];  Service: Gastroenterology;  Laterality: N/A;   POLYPECTOMY  03/18/2019   Procedure: POLYPECTOMY;  Surgeon: Rush Landmark Telford Nab., MD;  Location: [place];  Service: Gastroenterology;;   SUBMUCOSAL TATTOO INJECTION  03/18/2019   Procedure: SUBMUCOSAL TATTOO INJECTION;  Surgeon: Irving Copas., MD;  Location: [place];  Service: Gastroenterology;;   VIDEO BRONCHOSCOPY WITH ENDOBRONCHIAL NAVIGATION N/A 05/03/2020   Procedure: VIDEO BRONCHOSCOPY WITH ENDOBRONCHIAL NAVIGATION;  Surgeon: Collene Gobble, MD;  Location: [place] ENDOSCOPY;  Service: Pulmonary;  Laterality: N/A;     OB History   No obstetric history on file.     Family History  Problem Relation Age of Onset   Hypertension Mother    Colon cancer Neg Hx    Esophageal cancer Neg Hx    Inflammatory bowel disease Neg Hx    Liver disease Neg Hx    Pancreatic  cancer Neg Hx    Rectal cancer Neg Hx    Stomach cancer Neg Hx     Social History   Tobacco Use   Smoking status: Former    Pack years: 0.00    Types: Cigarettes    Quit date: 08/21/1995    Years since quitting: 25.2   Smokeless tobacco: Never   Tobacco comments:    Patient states she stopped smoking in 31 yrs ago (as of 2022)  Vaping Use   Vaping Use: Never used  Substance Use Topics   Alcohol use: No    Comment: in remission since 2007   Drug use: Not Currently    Comment: previously used marijuana     Home Medications Prior to Admission medications   Medication Sig Start Date End Date Taking? Authorizing Provider  pravastatin (PRAVACHOL) 40 MG tablet TAKE 1 TABLET BY MOUTH ONCE DAILY IN THE EVENING 08/25/20   Seawell, Jaimie A, DO  amLODipine (NORVASC) 5 MG tablet Take 1 tablet (5 mg total) by mouth daily. 08/10/20 08/10/21  Mosetta Anis, MD  ciprofloxacin (CIPRO) 250 MG tablet Take 1 tablet (250 mg total) by mouth 2 (two) times daily. 10/08/20   Volney American, PA-C  fluticasone [facility]) 50 MCG/ACT nasal spray Use 1-2 sprays per nostril once daily as needed for sinus congestion or allergy symptoms. 09/20/20   Jeralyn Bennett, MD  fluticasone (FLOVENT HFA) 110 MCG/ACT inhaler Inhale 2 puffs into the lungs 2 (two) times daily. 09/20/20   Jeralyn Bennett, MD  gabapentin (NEURONTIN) 300 MG capsule Take 1 capsule (300 mg total) by mouth at bedtime. 10/31/20   White, Leitha Schuller, NP  losartan (COZAAR) 25 MG tablet Take 1 tablet (25 mg total) by mouth daily. 08/10/20   Mosetta Anis, MD  methocarbamol (ROBAXIN) 500 MG tablet Take 1 tablet (500 mg total) by mouth 3 (three) times daily. 09/20/20   Jeralyn Bennett, MD  mirtazapine (REMERON) 7.5 MG tablet Take 1 tablet (7.5 mg total) by mouth at bedtime. 09/20/20   Jeralyn Bennett, MD  pantoprazole (PROTONIX) 40 MG tablet Take 1 tablet (40 mg total) by mouth daily. 09/06/20   Jeralyn Bennett, MD  ramelteon (ROZEREM) 8 MG tablet Take 1  tablet (8 mg total) by mouth at bedtime as needed for sleep. 09/06/20 09/06/21  Jeralyn Bennett, MD    Allergies    Banana, Grape (artificial) flavor, Ibuprofen, and Penicillins  Review of Systems   Review of Systems  All other systems reviewed and are negative.  Physical Exam Updated Vital Signs BP 140/76   Pulse 62   Temp 98.5 F (36.9 C) (Oral)   Resp 11   LMP 08/21/1982   SpO2 100%   Physical Exam Vitals and nursing note reviewed.  Constitutional:      General: She is not in acute distress.    Appearance: Normal appearance. She is well-developed.  HENT:     Head: Normocephalic and atraumatic.  Eyes:     Conjunctiva/sclera: Conjunctivae normal.     Pupils: Pupils are equal, round, and reactive to light.  Cardiovascular:     Rate and Rhythm: Normal rate and regular rhythm.     Heart sounds: Normal heart sounds.  Pulmonary:     Effort: Pulmonary effort is normal. No respiratory distress.     Breath sounds: Normal breath sounds.  Abdominal:     General: There is no distension.     Palpations: Abdomen is soft.     Tenderness: There is no abdominal tenderness.  Musculoskeletal:        General: No deformity. Normal range of motion.     Cervical back: Normal range of motion and neck supple.  Skin:    General: Skin is warm and dry.  Neurological:     General: No focal deficit present.     Mental Status: She is alert and oriented to person, place, and time.     Cranial Nerves: No cranial nerve deficit.     Sensory: No sensory deficit.     Motor: No weakness.     Coordination: Coordination normal.    ED Results / Procedures / Treatments   Labs (all labs ordered are listed, but only abnormal results are displayed) Labs Reviewed  RESP PANEL BY RT-PCR (FLU A&B, COVID) [id number]  BASIC METABOLIC PANEL  CBC WITH DIFFERENTIAL/PLATELET    EKG EKG Interpretation  Date/Time:  Thursday November 03 2020 08:31:40 EDT Ventricular Rate:  65 PR Interval:  122 QRS  Duration: 87 QT Interval:  410 QTC Calculation: 427 R Axis:   77 Text Interpretation: Sinus rhythm  ST elevation, consider inferior injury Confirmed by Dene Gentry [phone]) on 11/03/2020 9:59:26 AM  Radiology CT Head Wo Contrast  Result Date: 11/01/2020 CLINICAL DATA:  Left side numbness EXAM: CT HEAD WITHOUT CONTRAST TECHNIQUE: Contiguous axial images were obtained from the base of the skull through the vertex without intravenous contrast. COMPARISON:  10/21/2020 FINDINGS: Brain: No acute intracranial abnormality. Specifically, no hemorrhage, hydrocephalus, mass lesion, acute infarction, or significant intracranial injury. Vascular: No hyperdense vessel or unexpected calcification. Skull: No acute calvarial abnormality. Sinuses/Orbits: No acute findings Other: None IMPRESSION: Normal study. Electronically Signed   By: Rolm Baptise M.D.   On: 11/01/2020 11:21   MR BRAIN WO CONTRAST  Result Date: 11/03/2020 CLINICAL DATA:  Left-sided paresthesias EXAM: MRI HEAD WITHOUT CONTRAST TECHNIQUE: Multiplanar, multiecho pulse sequences of the brain and surrounding structures were obtained without intravenous contrast. COMPARISON:  No recent prior. FINDINGS: Brain: There is no acute infarction or intracranial hemorrhage. There is no intracranial mass, mass effect, or edema. There is no hydrocephalus or extra-axial fluid collection. Ventricles and sulci are normal in size and configuration. Minimal small foci of T2 hyperintensity in the supratentorial white matter are nonspecific but could reflect minor chronic microvascular ischemic changes. Vascular: Major vessel flow voids at the skull base are preserved. Skull and upper cervical spine: Normal marrow signal is preserved. Sinuses/Orbits: Paranasal sinuses are aerated. Orbits are unremarkable. Other: Sella is unremarkable.  Mastoid air cells are clear. IMPRESSION: No evidence of recent infarction, hemorrhage, or mass. Minor chronic microvascular ischemic changes.  Electronically Signed   By: Macy Mis M.D.   On: 11/03/2020 09:48    Procedures Procedures   Medications Ordered in ED Medications - No data to display  ED Course  I have reviewed the triage vital signs and the nursing notes.  Pertinent labs & imaging results that were available during my care of the patient were reviewed by me and considered in my medical decision making (see chart for details).    MDM Rules/Calculators/A&P                          MDM  MSE complete  Kimberly [name] was evaluated in Emergency Department on 11/03/2020 for the symptoms described in the history of present illness. She was evaluated in the context of the global COVID-19 pandemic, which necessitated consideration that the patient might be at risk for infection with the SARS-CoV-2 virus that causes COVID-19. Institutional protocols and algorithms that pertain to the evaluation of patients at risk for COVID-19 are in a state of rapid change based on information released by regulatory bodies including the CDC and federal and state organizations. These policies and algorithms were followed during the patient's care in the ED.  Patient presents with complaint of left-sided paresthesia.  This appears to be chronic complaint.  Patient is specifically requesting MRI of brain.  Work-up today does not reveal evidence of significant acute pathology.  MRI is without significant abnormality.  Other screening labs are without significant abnormality.  Importance of close follow-up is stressed.  Strict return precautions are given understood.  Patient reports that she has a pending neurology appointment and plans on keeping it.     Final Clinical Impression(s) / ED Diagnoses Final diagnoses:  Paresthesia    Rx / DC Orders ED Discharge Orders     None        Valarie Merino, MD 11/03/20 1022

## 2020-11-03 NOTE — Discharge Instructions (Addendum)
Return for any problem.  ?

## 2020-11-03 NOTE — ED Triage Notes (Signed)
Patient BIB [facility] for evaluation of left sided numbness and pain for the month, was prescribed gabapentin on Monday without relief. Patient requesting MRI to determine cause of numbness and pain. Patient is alert, oriented, and in no apparent distress at this time.  EMS vitals BP 126/80 HR 84 RR 18 98% SpO2 on room air  CBG 102

## 2020-11-09 ENCOUNTER — Other Ambulatory Visit: Payer: Self-pay

## 2020-11-09 ENCOUNTER — Emergency Department (HOSPITAL_COMMUNITY)
Admission: EM | Admit: 2020-11-09 | Discharge: 2020-11-09 | Disposition: A | Discharge status: Home / Self Care | Payer: Medicare Other | Attending: Emergency Medicine | Admitting: Emergency Medicine

## 2020-11-09 ENCOUNTER — Encounter (HOSPITAL_COMMUNITY): Payer: Self-pay | Admitting: Pharmacy Technician

## 2020-11-09 ENCOUNTER — Telehealth: Payer: Self-pay | Admitting: Neurology

## 2020-11-09 DIAGNOSIS — R202 Paresthesia of skin: Secondary | ICD-10-CM | POA: Diagnosis not present

## 2020-11-09 DIAGNOSIS — Z85118 Personal history of other malignant neoplasm of bronchus and lung: Secondary | ICD-10-CM | POA: Insufficient documentation

## 2020-11-09 DIAGNOSIS — Z8616 Personal history of COVID-19: Secondary | ICD-10-CM | POA: Insufficient documentation

## 2020-11-09 DIAGNOSIS — Z79899 Other long term (current) drug therapy: Secondary | ICD-10-CM | POA: Insufficient documentation

## 2020-11-09 DIAGNOSIS — I10 Essential (primary) hypertension: Secondary | ICD-10-CM | POA: Diagnosis not present

## 2020-11-09 DIAGNOSIS — Z87891 Personal history of nicotine dependence: Secondary | ICD-10-CM | POA: Insufficient documentation

## 2020-11-09 DIAGNOSIS — J45909 Unspecified asthma, uncomplicated: Secondary | ICD-10-CM | POA: Insufficient documentation

## 2020-11-09 DIAGNOSIS — Z7951 Long term (current) use of inhaled steroids: Secondary | ICD-10-CM | POA: Insufficient documentation

## 2020-11-09 NOTE — ED Notes (Signed)
Patient Alert and oriented to baseline. Stable and ambulatory to baseline. Patient verbalized understanding of the discharge instructions.  Patient belongings were taken by the patient.   

## 2020-11-09 NOTE — Discharge Instructions (Addendum)
Follow up with neurology as scheduled.

## 2020-11-09 NOTE — Telephone Encounter (Signed)
error 

## 2020-11-09 NOTE — ED Provider Notes (Signed)
[facility] EMERGENCY DEPARTMENT Provider Note   CSN: [id number] Arrival date & time: 11/09/20  9562     History No chief complaint on file.   Kimberly [name] is a 65 y.o. female.  65 year old female with complaint of tingling to left side head, upper and lower extremity onset 1-2 months ago, constant, no improvement with gabapentin which she has been taking x 1 week. Patient was seen in the ER for same 1 week ago, had MRI brain which was unremarkable and she was discharged to follow up with neurology. Patient is scheduled for early July. Patient reports radiation for lung CA at the end for December into January and had to hold her arms over her head for 30 minutes at a time, questions if this caused her to have the tingling now. No acute complaints today.       Past Medical History:  Diagnosis Date   Alcohol abuse, in remission    since around 1308   Alcoholic hepatitis    fatty liver on imaging- Treated Hepatits C and treated   Allergic rhinitis    Anemia    EGD with duodenal AVM, normal colonoscopy 04/2012   Asthma    Callus of foot 2009   Cancer [facility])    Chest pain, musculoskeletal 2011   Cholelithiasis    [place] 09/2008: Cholelithiasis is present, s/p cholecystectomy   Depression    Excessive cerumen in ear canal    since before 2000   GERD (gastroesophageal reflux disease)    History of blood transfusion    Hyperlipidemia    Hypertension    Menopausal syndrome    Treated with estradiol in the past - discontinued 04/2012   MENOPAUSAL SYNDROME 09/24/2006   2008 Note : The pt is adament about having this medication.  She fully understands the increased risk of heart disease and cancer that is associated with HRT and is willing to accept this risk in order to prevent the debilitating hot flashes she has off this medication.  Will continue to encourage her to try to wean herself off of this medication at our next visit.  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 ([place]) 05/12/2020   Mass of upper lobe of right lung 05/03/2020   COVID-19 09/25/2019   History of dysuria 09/24/2019   Rhinosinusitis 08/27/2019   Dysuria 05/25/2019   Tachycardia 05/25/2019   Hematuria 05/25/2019   AVM (arteriovenous malformation) of small bowel, 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   Health care maintenance 03/25/2013   Depression 12/03/2012   Iron deficiency anemia 05/08/2012   Insomnia 05/07/2012   Weight loss 07/27/2009   HLD (hyperlipidemia) 03/25/2008   Constipation 11/04/2007   Essential hypertension 09/24/2006   Allergies 09/24/2006   Asthma 09/24/2006   GERD 09/24/2006    Past Surgical History:  Procedure Laterality Date   ABDOMINAL HYSTERECTOMY  1980s   BIOPSY  03/18/2019   Procedure: BIOPSY;  Surgeon: Irving Copas., MD;  Location: [place];  Service: Gastroenterology;;   BRONCHIAL BIOPSY  05/03/2020   Procedure: BRONCHIAL BIOPSIES;  Surgeon: Collene Gobble, MD;  Location: [facility] ENDOSCOPY;  Service: Pulmonary;;   BRONCHIAL BRUSHINGS  05/03/2020   Procedure: BRONCHIAL BRUSHINGS;  Surgeon: Collene Gobble, MD;  Location: [facility] ENDOSCOPY;  Service: Pulmonary;;   BRONCHIAL NEEDLE ASPIRATION BIOPSY  05/03/2020   Procedure: BRONCHIAL NEEDLE ASPIRATION BIOPSIES;  Surgeon: Collene Gobble, MD;  Location: [facility] ENDOSCOPY;  Service: Pulmonary;;   CHOLECYSTECTOMY  01/06/09   COLONOSCOPY  05/09/2012   Procedure: COLONOSCOPY;  Surgeon: Inda Castle, MD;  Location: [place];  Service: Endoscopy;  Laterality: N/A;   COLONOSCOPY WITH PROPOFOL N/A 03/18/2019   Procedure: COLONOSCOPY WITH PROPOFOL;  Surgeon:  Rush Landmark Telford Nab., MD;  Location: [place];  Service: Gastroenterology;  Laterality: N/A;   ENTEROSCOPY N/A 03/18/2019   Procedure: ENTEROSCOPY;  Surgeon: Rush Landmark Telford Nab., MD;  Location: [place];  Service: Gastroenterology;  Laterality: N/A;   ESOPHAGOGASTRODUODENOSCOPY  05/09/2012   Procedure: ESOPHAGOGASTRODUODENOSCOPY (EGD);  Surgeon: Inda Castle, MD;  Location: [place];  Service: Endoscopy;  Laterality: N/A;   FIDUCIAL MARKER PLACEMENT  05/03/2020   Procedure: FIDUCIAL MARKER PLACEMENT;  Surgeon: Collene Gobble, MD;  Location: [facility] ENDOSCOPY;  Service: Pulmonary;;   HEMOSTASIS CLIP PLACEMENT  03/18/2019   Procedure: HEMOSTASIS CLIP PLACEMENT;  Surgeon: Irving Copas., MD;  Location: [place];  Service: Gastroenterology;;   HOT HEMOSTASIS N/A 03/18/2019   Procedure: HOT HEMOSTASIS (ARGON PLASMA COAGULATION/BICAP);  Surgeon: Irving Copas., MD;  Location: [place];  Service: Gastroenterology;  Laterality: N/A;   POLYPECTOMY  03/18/2019   Procedure: POLYPECTOMY;  Surgeon: Rush Landmark Telford Nab., MD;  Location: [place];  Service: Gastroenterology;;   SUBMUCOSAL TATTOO INJECTION  03/18/2019   Procedure: SUBMUCOSAL TATTOO INJECTION;  Surgeon: Irving Copas., MD;  Location: [address];  Service: Gastroenterology;;   VIDEO BRONCHOSCOPY WITH ENDOBRONCHIAL NAVIGATION N/A 05/03/2020   Procedure: VIDEO BRONCHOSCOPY WITH ENDOBRONCHIAL NAVIGATION;  Surgeon: Collene Gobble, MD;  Location: [place] ENDOSCOPY;  Service: Pulmonary;  Laterality: N/A;     OB History   No obstetric history on file.     Family History  Problem Relation Age of Onset   Hypertension Mother    Colon cancer Neg Hx    Esophageal cancer Neg Hx    Inflammatory bowel disease Neg Hx    Liver disease Neg Hx    Pancreatic cancer Neg Hx    Rectal cancer Neg Hx    Stomach cancer Neg Hx     Social History   Tobacco Use   Smoking status: Former    Pack years:  0.00    Types: Cigarettes    Quit date: 08/21/1995    Years since quitting: 25.2   Smokeless tobacco: Never   Tobacco comments:    Patient states she stopped smoking in 31 yrs ago (as of 2022)  Vaping Use   Vaping Use: Never used  Substance Use Topics   Alcohol use: No    Comment: in remission since 2007   Drug use: Not Currently    Comment: previously used marijuana     Home Medications Prior to Admission medications   Medication Sig Start Date End Date Taking? Authorizing Provider  pravastatin (PRAVACHOL) 40 MG tablet TAKE 1 TABLET BY MOUTH ONCE DAILY IN THE EVENING 08/25/20   Seawell, Jaimie A, DO  amLODipine (NORVASC) 5 MG tablet Take 1 tablet (5 mg total) by mouth daily. 08/10/20 08/10/21  Mosetta Anis, MD  ciprofloxacin (CIPRO) 250 MG tablet Take 1 tablet (250 mg total) by mouth 2 (two) times daily. 10/08/20  Volney American, PA-C  fluticasone [facility]) 50 MCG/ACT nasal spray Use 1-2 sprays per nostril once daily as needed for sinus congestion or allergy symptoms. 09/20/20   Jeralyn Bennett, MD  fluticasone (FLOVENT HFA) 110 MCG/ACT inhaler Inhale 2 puffs into the lungs 2 (two) times daily. 09/20/20   Jeralyn Bennett, MD  gabapentin (NEURONTIN) 300 MG capsule Take 1 capsule (300 mg total) by mouth at bedtime. 10/31/20   White, Leitha Schuller, NP  losartan (COZAAR) 25 MG tablet Take 1 tablet (25 mg total) by mouth daily. 08/10/20   Mosetta Anis, MD  methocarbamol (ROBAXIN) 500 MG tablet Take 1 tablet (500 mg total) by mouth 3 (three) times daily. 09/20/20   Jeralyn Bennett, MD  mirtazapine (REMERON) 7.5 MG tablet Take 1 tablet (7.5 mg total) by mouth at bedtime. 09/20/20   Jeralyn Bennett, MD  pantoprazole (PROTONIX) 40 MG tablet Take 1 tablet (40 mg total) by mouth daily. 09/06/20   Jeralyn Bennett, MD  ramelteon (ROZEREM) 8 MG tablet Take 1 tablet (8 mg total) by mouth at bedtime as needed for sleep. 09/06/20 09/06/21  Jeralyn Bennett, MD    Allergies    Banana, Grape  (artificial) flavor, Ibuprofen, and Penicillins  Review of Systems   Review of Systems  Constitutional:  Negative for fever.  Respiratory:  Negative for shortness of breath.   Cardiovascular:  Negative for chest pain.  Gastrointestinal:  Negative for abdominal pain, nausea and vomiting.  Musculoskeletal:  Negative for arthralgias and myalgias.  Skin:  Negative for rash and wound.  Neurological:  Positive for numbness. Negative for weakness.  Psychiatric/Behavioral:  Negative for confusion.   All other systems reviewed and are negative.  Physical Exam Updated Vital Signs BP 122/71   Pulse 80   Temp 98 F (36.7 C) (Oral)   Resp 16   LMP 08/21/1982   SpO2 98%   Physical Exam Vitals and nursing note reviewed.  Constitutional:      General: She is not in acute distress.    Appearance: She is well-developed. She is not diaphoretic.  HENT:     Head: Normocephalic and atraumatic.  Cardiovascular:     Rate and Rhythm: Normal rate and regular rhythm.     Pulses: Normal pulses.     Heart sounds: Normal heart sounds.  Pulmonary:     Effort: Pulmonary effort is normal.     Breath sounds: Normal breath sounds.  Abdominal:     Palpations: Abdomen is soft.     Tenderness: There is no abdominal tenderness.  Musculoskeletal:        General: No swelling, tenderness or deformity.     Cervical back: Neck supple.     Right lower leg: No edema.     Left lower leg: No edema.  Skin:    General: Skin is warm and dry.     Findings: No erythema or rash.  Neurological:     Mental Status: She is alert and oriented to person, place, and time.     Sensory: Sensory deficit present.     Motor: No weakness.     Deep Tendon Reflexes:     Reflex Scores:      Bicep reflexes are 2+ on the right side and 2+ on the left side.      Brachioradialis reflexes are 1+ on the right side and 1+ on the left side.      Patellar reflexes are 2+ on the right side and 2+ on the left side. Psychiatric:  Behavior: Behavior normal.    ED Results / Procedures / Treatments   Labs (all labs ordered are listed, but only abnormal results are displayed) Labs Reviewed - No data to display  EKG None  Radiology No results found.  Procedures Procedures   Medications Ordered in ED Medications - No data to display  ED Course  I have reviewed the triage vital signs and the nursing notes.  Pertinent labs & imaging results that were available during my care of the patient were reviewed by me and considered in my medical decision making (see chart for details).  Clinical Course as of 11/09/20 1351  Wed Nov 09, 8465  1663 65 year old female presents with complaint of left-sided body tingling for the past 1 to 2 months.  Patient is here today requesting x-ray of her left shoulder, had an MRI of her brain last week that was unremarkable.  She believes that her symptoms may be due to having to hold her arms over her head for 30 minutes at a time x3 radiation treatments earlier this year. Patient's been seen multiple times for these complaints, there is nothing new or different about her complaints today. Patient is scheduled to follow-up with neurology next month. Discussed with patient limited use of x-ray with complaint of tingling.  Patient is satisfied with not receiving an x-ray today and prefers to follow-up with neurology, she will call to see if there is an earlier appointment available. [LM]    Clinical Course User Index [LM] Roque Lias   MDM Rules/Calculators/A&P                          Final Clinical Impression(s) / ED Diagnoses Final diagnoses:  Paresthesia    Rx / DC Orders ED Discharge Orders     None        Tacy Learn, PA-C 11/09/20 1351    Charlesetta Shanks, MD 11/20/20 1232

## 2020-11-09 NOTE — ED Triage Notes (Signed)
Pt bib ems from home with reports of L sided tingling from her head down her left arm into her fingers. Pt had an MRI last week for same issue. Pt here today wanting to see a neurologist and get an xray of her shoulder.  100/56 CBG 101 98% RA

## 2020-11-11 ENCOUNTER — Encounter: Payer: Self-pay | Admitting: Neurology

## 2020-11-12 ENCOUNTER — Emergency Department (HOSPITAL_COMMUNITY)
Admission: EM | Admit: 2020-11-12 | Discharge: 2020-11-12 | Discharge status: Against Medical Advice | Payer: Medicare Other

## 2020-11-12 NOTE — ED Notes (Signed)
Pt stated that she was going to see an eye doctor and she didn't want to stay any longer.

## 2020-11-14 ENCOUNTER — Emergency Department (HOSPITAL_COMMUNITY)
Admission: EM | Admit: 2020-11-14 | Discharge: 2020-11-14 | Disposition: A | Discharge status: Home / Self Care | Payer: Medicare Other | Attending: Emergency Medicine | Admitting: Emergency Medicine

## 2020-11-14 ENCOUNTER — Encounter (HOSPITAL_COMMUNITY): Payer: Self-pay

## 2020-11-14 ENCOUNTER — Other Ambulatory Visit: Payer: Self-pay

## 2020-11-14 DIAGNOSIS — R202 Paresthesia of skin: Secondary | ICD-10-CM | POA: Insufficient documentation

## 2020-11-14 DIAGNOSIS — I10 Essential (primary) hypertension: Secondary | ICD-10-CM | POA: Insufficient documentation

## 2020-11-14 DIAGNOSIS — Z85118 Personal history of other malignant neoplasm of bronchus and lung: Secondary | ICD-10-CM | POA: Diagnosis not present

## 2020-11-14 DIAGNOSIS — Z8616 Personal history of COVID-19: Secondary | ICD-10-CM | POA: Insufficient documentation

## 2020-11-14 DIAGNOSIS — Z7951 Long term (current) use of inhaled steroids: Secondary | ICD-10-CM | POA: Diagnosis not present

## 2020-11-14 DIAGNOSIS — R200 Anesthesia of skin: Secondary | ICD-10-CM | POA: Diagnosis present

## 2020-11-14 DIAGNOSIS — J45909 Unspecified asthma, uncomplicated: Secondary | ICD-10-CM | POA: Diagnosis not present

## 2020-11-14 DIAGNOSIS — Z87891 Personal history of nicotine dependence: Secondary | ICD-10-CM | POA: Insufficient documentation

## 2020-11-14 DIAGNOSIS — Z79899 Other long term (current) drug therapy: Secondary | ICD-10-CM | POA: Diagnosis not present

## 2020-11-14 NOTE — ED Provider Notes (Signed)
[place] EMERGENCY DEPARTMENT Provider Note   CSN: [id number] Arrival date & time: 11/14/20  8546     History Chief Complaint  Patient presents with   Numbness    Kimberly [name] is a 65 y.o. female.  66 year old female history of hypertension, hyperlipidemia, esophageal reflux presents to the emergency department for evaluation persistent left-sided tingling.  Reports that she has had paresthesias in her left cheek as well as her left upper extremity and left lower extremity.  These symptoms have been persistent for more than 2 weeks.  Has been evaluated in the ED on multiple occasions for these complaints.  Denies any worsening or change to these symptoms.  Came to the emergency department tonight.  She is eager to receive an answer as to why these paresthesias persist and cannot get in to see Neurology until July 8th.  The history is provided by the patient. No language interpreter was used.      Past Medical History:  Diagnosis Date   Alcohol abuse, in remission    since around 2703   Alcoholic hepatitis    fatty liver on imaging- Treated Hepatits C and treated   Allergic rhinitis    Anemia    EGD with duodenal AVM, normal colonoscopy 04/2012   Asthma    Callus of foot 2009   Cancer [facility])    Chest pain, musculoskeletal 2011   Cholelithiasis    [place] 09/2008: Cholelithiasis is present, s/p cholecystectomy   Depression    Excessive cerumen in ear canal    since before 2000   GERD (gastroesophageal reflux disease)    History of blood transfusion    Hyperlipidemia    Hypertension    Menopausal syndrome    Treated with estradiol in the past - discontinued 04/2012   MENOPAUSAL SYNDROME 09/24/2006   2008 Note : The pt is adament about having this medication.  She fully understands the increased risk of heart disease and cancer that is associated with HRT and is willing to accept this risk in order to prevent the debilitating hot flashes she has off this  medication.  Will continue to encourage her to try to wean herself off of this medication at our next visit.  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 ([place]) 05/12/2020   Mass of upper lobe of right lung 05/03/2020   COVID-19 09/25/2019   History of dysuria 09/24/2019   Rhinosinusitis 08/27/2019   Dysuria 05/25/2019   Tachycardia 05/25/2019   Hematuria 05/25/2019   AVM (arteriovenous malformation) of small bowel, 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   Health care maintenance 03/25/2013   Depression 12/03/2012   Iron deficiency anemia 05/08/2012   Insomnia 05/07/2012   Weight loss 07/27/2009   HLD (hyperlipidemia) 03/25/2008   Constipation 11/04/2007   Essential hypertension 09/24/2006   Allergies 09/24/2006   Asthma 09/24/2006   GERD 09/24/2006    Past Surgical History:  Procedure Laterality Date   ABDOMINAL HYSTERECTOMY  1980s   BIOPSY  03/18/2019   Procedure: BIOPSY;  Surgeon: Irving Copas., MD;  Location: [facility] ENDOSCOPY;  Service: Gastroenterology;;   BRONCHIAL BIOPSY  05/03/2020   Procedure: BRONCHIAL BIOPSIES;  Surgeon: Collene Gobble, MD;  Location: [facility] ENDOSCOPY;  Service: Pulmonary;;   BRONCHIAL BRUSHINGS  05/03/2020   Procedure: BRONCHIAL BRUSHINGS;  Surgeon: Collene Gobble, MD;  Location: [facility] ENDOSCOPY;  Service: Pulmonary;;   BRONCHIAL NEEDLE ASPIRATION BIOPSY  05/03/2020   Procedure: BRONCHIAL NEEDLE ASPIRATION BIOPSIES;  Surgeon: Collene Gobble, MD;  Location: [facility] ENDOSCOPY;  Service: Pulmonary;;   CHOLECYSTECTOMY  01/06/09   COLONOSCOPY  05/09/2012   Procedure: COLONOSCOPY;  Surgeon: Inda Castle, MD;  Location: [place];  Service: Endoscopy;  Laterality: N/A;   COLONOSCOPY WITH PROPOFOL N/A 03/18/2019   Procedure: COLONOSCOPY WITH PROPOFOL;  Surgeon: Rush Landmark Telford Nab., MD;  Location: [place];  Service: Gastroenterology;  Laterality: N/A;   ENTEROSCOPY N/A 03/18/2019   Procedure: ENTEROSCOPY;  Surgeon: Rush Landmark Telford Nab., MD;  Location: [place];  Service: Gastroenterology;  Laterality: N/A;   ESOPHAGOGASTRODUODENOSCOPY  05/09/2012   Procedure: ESOPHAGOGASTRODUODENOSCOPY (EGD);  Surgeon: Inda Castle, MD;  Location: [place];  Service: Endoscopy;  Laterality: N/A;   FIDUCIAL MARKER PLACEMENT  05/03/2020   Procedure: FIDUCIAL MARKER PLACEMENT;  Surgeon: Collene Gobble, MD;  Location: [facility] ENDOSCOPY;  Service: Pulmonary;;   HEMOSTASIS CLIP PLACEMENT  03/18/2019   Procedure: HEMOSTASIS CLIP PLACEMENT;  Surgeon: Irving Copas., MD;  Location: [place];  Service: Gastroenterology;;   HOT HEMOSTASIS N/A 03/18/2019   Procedure: HOT HEMOSTASIS (ARGON PLASMA COAGULATION/BICAP);  Surgeon: Irving Copas., MD;  Location: [place];  Service: Gastroenterology;  Laterality: N/A;   POLYPECTOMY  03/18/2019   Procedure: POLYPECTOMY;  Surgeon: Rush Landmark Telford Nab., MD;  Location: [place];  Service: Gastroenterology;;   SUBMUCOSAL TATTOO INJECTION  03/18/2019   Procedure: SUBMUCOSAL TATTOO INJECTION;  Surgeon: Irving Copas., MD;  Location: [place];  Service: Gastroenterology;;   VIDEO BRONCHOSCOPY WITH ENDOBRONCHIAL NAVIGATION N/A 05/03/2020   Procedure: VIDEO BRONCHOSCOPY WITH ENDOBRONCHIAL NAVIGATION;  Surgeon: Collene Gobble, MD;  Location: [place] ENDOSCOPY;  Service: Pulmonary;  Laterality: N/A;     OB History   No obstetric history on file.     Family History  Problem Relation Age of Onset   Hypertension Mother    Colon cancer Neg Hx    Esophageal cancer Neg Hx    Inflammatory bowel disease Neg Hx    Liver disease Neg Hx    Pancreatic  cancer Neg Hx    Rectal cancer Neg Hx    Stomach cancer Neg Hx     Social History   Tobacco Use   Smoking status: Former    Pack years: 0.00    Types: Cigarettes    Quit date: 08/21/1995    Years since quitting: 25.2   Smokeless tobacco: Never   Tobacco comments:    Patient states she stopped smoking in 31 yrs ago (as of 2022)  Vaping Use   Vaping Use: Never used  Substance Use Topics   Alcohol use: No    Comment: in remission since 2007   Drug use: Not Currently    Comment: previously used marijuana     Home Medications Prior to Admission medications   Medication Sig Start Date End Date Taking? Authorizing Provider  pravastatin (PRAVACHOL) 40 MG tablet TAKE 1 TABLET BY MOUTH ONCE DAILY IN THE EVENING 08/25/20   Seawell, Jaimie A, DO  amLODipine (NORVASC) 5 MG tablet Take 1 tablet (5 mg total) by mouth daily. 08/10/20 08/10/21  Mosetta Anis, MD  ciprofloxacin (CIPRO) 250 MG tablet Take  1 tablet (250 mg total) by mouth 2 (two) times daily. 10/08/20   Volney American, PA-C  fluticasone [facility]) 50 MCG/ACT nasal spray Use 1-2 sprays per nostril once daily as needed for sinus congestion or allergy symptoms. 09/20/20   Jeralyn Bennett, MD  fluticasone (FLOVENT HFA) 110 MCG/ACT inhaler Inhale 2 puffs into the lungs 2 (two) times daily. 09/20/20   Jeralyn Bennett, MD  gabapentin (NEURONTIN) 300 MG capsule Take 1 capsule (300 mg total) by mouth at bedtime. 10/31/20   White, Leitha Schuller, NP  losartan (COZAAR) 25 MG tablet Take 1 tablet (25 mg total) by mouth daily. 08/10/20   Mosetta Anis, MD  methocarbamol (ROBAXIN) 500 MG tablet Take 1 tablet (500 mg total) by mouth 3 (three) times daily. 09/20/20   Jeralyn Bennett, MD  mirtazapine (REMERON) 7.5 MG tablet Take 1 tablet (7.5 mg total) by mouth at bedtime. 09/20/20   Jeralyn Bennett, MD  pantoprazole (PROTONIX) 40 MG tablet Take 1 tablet (40 mg total) by mouth daily. 09/06/20   Jeralyn Bennett, MD  ramelteon (ROZEREM) 8 MG tablet Take 1  tablet (8 mg total) by mouth at bedtime as needed for sleep. 09/06/20 09/06/21  Jeralyn Bennett, MD    Allergies    Banana, Grape (artificial) flavor, Ibuprofen, and Penicillins  Review of Systems   Review of Systems Ten systems reviewed and are negative for acute change, except as noted in the HPI.    Physical Exam Updated Vital Signs BP 130/73 (BP Location: Right Arm)   Pulse 72   Temp 98.1 F (36.7 C) (Oral)   Resp 19   Ht 5\' 4"  (1.626 m)   Wt 42.9 kg   LMP 08/21/1982   SpO2 99%   BMI 16.23 kg/m   Physical Exam Vitals and nursing note reviewed.  Constitutional:      General: She is not in acute distress.    Appearance: She is well-developed. She is not diaphoretic.     Comments: Nontoxic appearing and in NAD  HENT:     Head: Normocephalic and atraumatic.     Right Ear: External ear normal.     Left Ear: External ear normal.  Eyes:     General: No scleral icterus.    Extraocular Movements: Extraocular movements intact.     Conjunctiva/sclera: Conjunctivae normal.     Pupils: Pupils are equal, round, and reactive to light.  Pulmonary:     Effort: Pulmonary effort is normal. No respiratory distress.     Comments: Respirations even and unlabored Musculoskeletal:        General: Normal range of motion.     Cervical back: Normal range of motion.  Skin:    General: Skin is warm and dry.     Coloration: Skin is not pale.     Findings: No erythema or rash.  Neurological:     Mental Status: She is alert and oriented to person, place, and time.     Coordination: Coordination normal.     Comments: GCS 15. Speech is goal oriented. No cranial nerve deficits appreciated; symmetric eyebrow raise, no facial drooping, tongue midline. Patient has equal grip strength bilaterally with 5/5 strength against resistance in all major muscle groups bilaterally. Sensation to light touch intact. Patient moves extremities without ataxia. Patient ambulatory with steady gait.  Psychiatric:         Behavior: Behavior normal.    ED Results / Procedures / Treatments   Labs (all labs ordered are listed, but only abnormal  results are displayed) Labs Reviewed - No data to display  EKG None  Radiology No results found.  Procedures Procedures   Medications Ordered in ED Medications - No data to display  ED Course  I have reviewed the triage vital signs and the nursing notes.  Pertinent labs & imaging results that were available during my care of the patient were reviewed by me and considered in my medical decision making (see chart for details).    MDM Rules/Calculators/A&P                          65 year old female presents to the emergency department for evaluation of persistent paresthesias. Has follow-up scheduled with neurology on December 02, 2020.  Seen in the emergency department for this on 10/31/2020, 11/03/2020, 11/09/2020.  Chart review reveals head CT at the end of May as well as June 7th.  Negative brain MRI on June 9th.  She has a nonfocal neurologic exam today and denies any changes in her paresthesias since prior evaluations.  No further emergent work-up is indicated.  She has been encouraged to keep her impending visit with outpatient neurology.  Return precautions discussed and provided. Patient discharged in stable condition with no unaddressed concerns.   Final Clinical Impression(s) / ED Diagnoses Final diagnoses:  Paresthesia    Rx / DC Orders ED Discharge Orders     None        Antonietta Breach, PA-C 11/14/20 9935    Ripley Fraise, MD 11/14/20 2308

## 2020-11-14 NOTE — ED Triage Notes (Signed)
Patient BIB [facility] from home, patient reports L sided numbness and tingling, hx of same with recent negative MRI.

## 2020-11-15 NOTE — Progress Notes (Signed)
[facility]  PATIENT: Kimberly [name] DOB: [birth year]  REFERRING DOCTOR OR PCP:  My Conley Rolls (Optometry); Loura Back, NP (PCP) SOURCE:   _________________________________   HISTORICAL  CHIEF COMPLAINT:  Chief Complaint  Patient presents with   New Patient (Initial Visit)    RM 12, alone. Paper referral form My Mardene Sayer for headaches, numbness, tingling L side of face. Sx started years ago. Sx intermittent. Pt states she is not having headaches, only tingling.     HISTORY OF PRESENT ILLNESS:  I had the pleasure of seeing Kimberly [name] at [facility] for neurologic consultation regarding her left facial and limb numbness.  She is a 65 year old woman with facial numbness since 2017.     Numbness is in the left cheek and sometimes intothr left arm and leg.   This is intermittent.    Most of the time she gets a crawling sensation though the numbness can be painful at times.   She reports weakness in the left arm and leg at times but not her face.    Her right side is fine.     She was placed on gabapentin 300 mg po qHS but it has not helped.     He had head CT and MRI 10/2020 and both were normal.       IMAGING was personally reviewed. MRI of the brain 11/03/2020   It shows a few scattered punctate T2/FLAIR hyperintense foci consistent with age-appropriate minimal chronic microvascular ischemic changes.  There were no acute findings.  CT scan of the head 11/01/2020 was normal.  CT scan of the cervical spine 11/01/2020 showed uncovertebral spurring and disc bulging at C3-C4 likely leading to spinal stenosis.  There was mild foraminal narrowing.  REVIEW OF SYSTEMS: Constitutional: No fevers, chills, sweats, or change in appetite Eyes: No visual changes, double vision, eye pain Ear, nose and throat: No hearing loss, ear pain, nasal congestion, sore throat Cardiovascular: No chest pain, palpitations Respiratory:  No shortness of breath at rest or with  exertion.   No wheezes GastrointestinaI: No nausea, vomiting, diarrhea, abdominal pain, fecal incontinence Genitourinary:  No dysuria, urinary retention or frequency.  No nocturia. Musculoskeletal:  No neck pain, back pain Integumentary: No rash, pruritus, skin lesions Neurological: as above Psychiatric: No depression at this time.  No anxiety Endocrine: No palpitations, diaphoresis, change in appetite, change in weigh or increased thirst Hematologic/Lymphatic:  No anemia, purpura, petechiae. Allergic/Immunologic: No itchy/runny eyes, nasal congestion, recent allergic reactions, rashes  ALLERGIES: Allergies  Allergen Reactions   Banana     Lip swelling   Grape (Artificial) Flavor Swelling    Allergic to grapes   Ibuprofen Rash   Penicillins Rash    Did it involve swelling of the face/tongue/throat, SOB, or low BP? No Did it involve sudden or severe rash/hives, skin peeling, or any reaction on the inside of your mouth or nose? No Did you need to seek medical attention at a hospital or doctor's office? No When did it last happen?      more than 10 years If all above answers are "NO", may proceed with cephalosporin use.     HOME MEDICATIONS:  Current Outpatient Medications:    fluticasone (FLONASE) 50 MCG/ACT nasal spray, Use 1-2 sprays per nostril once daily as needed for sinus congestion or allergy symptoms., Disp: 16 g, Rfl: 0   fluticasone (FLOVENT HFA) 110 MCG/ACT inhaler, Inhale 2 puffs into the lungs 2 (two) times daily., Disp: 36  g, Rfl: 1   gabapentin (NEURONTIN) 300 MG capsule, Take 1 capsule (300 mg total) by mouth at bedtime., Disp: 30 capsule, Rfl: 1   losartan (COZAAR) 25 MG tablet, Take 1 tablet (25 mg total) by mouth daily., Disp: 90 tablet, Rfl: 3   pantoprazole (PROTONIX) 40 MG tablet, Take 1 tablet (40 mg total) by mouth daily., Disp: 30 tablet, Rfl: 3  PAST MEDICAL HISTORY: Past Medical History:  Diagnosis Date   Alcohol abuse, in remission    since around  2005   Alcoholic hepatitis    fatty liver on imaging- Treated Hepatits C and treated   Allergic rhinitis    Anemia    EGD with duodenal AVM, normal colonoscopy 04/2012   Asthma    Callus of foot 2009   Cancer [facility])    Chest pain, musculoskeletal 2011   Cholelithiasis    [place] 09/2008: Cholelithiasis is present, s/p cholecystectomy   Depression    Excessive cerumen in ear canal    since before 2000   GERD (gastroesophageal reflux disease)    History of blood transfusion    Hyperlipidemia    Hypertension    Menopausal syndrome    Treated with estradiol in the past - discontinued 04/2012   MENOPAUSAL SYNDROME 09/24/2006   2008 Note : The pt is adament about having this medication.  She fully understands the increased risk of heart disease and cancer that is associated with HRT and is willing to accept this risk in order to prevent the debilitating hot flashes she has off this medication.  Will continue to encourage her to try to wean herself off of this medication at our next visit.  2009 note indicated that she tr   Otitis externa, acute Feb 2012   bilateral, treated with azithromycin after failure of neomycin drops   Otitis media, acute Feb 2012   PONV (postoperative nausea and vomiting)     PAST SURGICAL HISTORY: Past Surgical History:  Procedure Laterality Date   ABDOMINAL HYSTERECTOMY  1980s   BIOPSY  03/18/2019   Procedure: BIOPSY;  Surgeon: Lemar Lofty., MD;  Location: [facility] ENDOSCOPY;  Service: Gastroenterology;;   BRONCHIAL BIOPSY  05/03/2020   Procedure: BRONCHIAL BIOPSIES;  Surgeon: Leslye Peer, MD;  Location: [facility] ENDOSCOPY;  Service: Pulmonary;;   BRONCHIAL BRUSHINGS  05/03/2020   Procedure: BRONCHIAL BRUSHINGS;  Surgeon: Leslye Peer, MD;  Location[facility] ENDOSCOPY;  Service: Pulmonary;;   BRONCHIAL NEEDLE ASPIRATION BIOPSY  05/03/2020   Procedure: BRONCHIAL NEEDLE ASPIRATION BIOPSIES;  Surgeon: Leslye Peer, MD;  Location: [facility] ENDOSCOPY;  Service: Pulmonary;;    CHOLECYSTECTOMY  01/06/09   COLONOSCOPY  05/09/2012   Procedure: COLONOSCOPY;  Surgeon: Louis Meckel, MD;  Location: [facility] ENDOSCOPY;  Service: Endoscopy;  Laterality: N/A;   COLONOSCOPY WITH PROPOFOL N/A 03/18/2019   Procedure: COLONOSCOPY WITH PROPOFOL;  Surgeon: Meridee Score Netty Starring., MD;  Location[facility] ENDOSCOPY;  Service: Gastroenterology;  Laterality: N/A;   ENTEROSCOPY N/A 03/18/2019   Procedure: ENTEROSCOPY;  Surgeon: Meridee Score Netty Starring., MD;  Location: [facility] ENDOSCOPY;  Service: Gastroenterology;  Laterality: N/A;   ESOPHAGOGASTRODUODENOSCOPY  05/09/2012   Procedure: ESOPHAGOGASTRODUODENOSCOPY (EGD);  Surgeon: Louis Meckel, MD;  Location: [facility] ENDOSCOPY;  Service: Endoscopy;  Laterality: N/A;   FIDUCIAL MARKER PLACEMENT  05/03/2020   Procedure: FIDUCIAL MARKER PLACEMENT;  Surgeon: Leslye Peer, MD;  Location: [facility] ENDOSCOPY;  Service: Pulmonary;;   HEMOSTASIS CLIP PLACEMENT  03/18/2019   Procedure: HEMOSTASIS CLIP PLACEMENT;  Surgeon: Lemar Lofty., MD;  Location: [facility] ENDOSCOPY;  Service: Gastroenterology;;   HOT HEMOSTASIS N/A 03/18/2019   Procedure: HOT HEMOSTASIS (ARGON PLASMA COAGULATION/BICAP);  Surgeon: Lemar Lofty., MD;  Location: [facility] ENDOSCOPY;  Service: Gastroenterology;  Laterality: N/A;   POLYPECTOMY  03/18/2019   Procedure: POLYPECTOMY;  Surgeon: Meridee Score Netty Starring., MD;  Location: [facility] ENDOSCOPY;  Service: Gastroenterology;;   SUBMUCOSAL TATTOO INJECTION  03/18/2019   Procedure: SUBMUCOSAL TATTOO INJECTION;  Surgeon: Lemar Lofty., MD;  Location: [facility] ENDOSCOPY;  Service: Gastroenterology;;   VIDEO BRONCHOSCOPY WITH ENDOBRONCHIAL NAVIGATION N/A 05/03/2020   Procedure: VIDEO BRONCHOSCOPY WITH ENDOBRONCHIAL NAVIGATION;  Surgeon: Leslye Peer, MD;  Location: [facility] ENDOSCOPY;  Service: Pulmonary;  Laterality: N/A;    FAMILY HISTORY: Family History  Problem Relation Age of Onset   Hypertension Mother    Colon cancer Neg Hx    Esophageal  cancer Neg Hx    Inflammatory bowel disease Neg Hx    Liver disease Neg Hx    Pancreatic cancer Neg Hx    Rectal cancer Neg Hx    Stomach cancer Neg Hx     SOCIAL HISTORY:  Social History   Socioeconomic History   Marital status: Single    Spouse name: Not on file   Number of children: 2   Years of education: 12   Highest education level: Not on file  Occupational History   Not on file  Tobacco Use   Smoking status: Former    Pack years: 0.00    Types: Cigarettes    Quit date: 08/21/1995    Years since quitting: 25.2   Smokeless tobacco: Never   Tobacco comments:    Patient states she stopped smoking in 31 yrs ago (as of 2022)  Vaping Use   Vaping Use: Never used  Substance and Sexual Activity   Alcohol use: No    Comment: in remission since 2007   Drug use: Not Currently    Comment: previously used marijuana    Sexual activity: Never  Other Topics Concern   Not on file  Social History Narrative   Right handed   Caffeine use: none   Lives alone   Social Determinants of Health   Financial Resource Strain: Not on file  Food Insecurity: Not on file  Transportation Needs: Not on file  Physical Activity: Not on file  Stress: Not on file  Social Connections: Not on file  Intimate Partner Violence: Not on file     PHYSICAL EXAM  Vitals:   11/16/20 0742  BP: 133/77  Pulse: 64  Weight: 97 lb (44 kg)  Height: 5\' 4"  (1.626 m)    Body mass index is 16.65 kg/m.   General: The patient is well-developed and well-nourished and in no acute distress  HEENT:  Head is [place]/AT.  Sclera are anicteric.     Neck: No carotid bruits are noted.  The neck is nontender.  Cardiovascular: The heart has a regular rate and rhythm with a normal S1 and S2. There were no murmurs, gallops or rubs.    Skin: Extremities are without rash or  edema.  Musculoskeletal:  Back is nontender  Neurologic Exam  Mental status: The patient is alert and oriented x 3 at the time of the  examination. The patient has apparent normal recent and remote memory, with an apparently normal attention span and concentration ability.   Speech is normal.  Cranial nerves: Extraocular movements are full. Pupils are equal, round, and reactive to light and accomodation.  Visual fields are  full.  Facial symmetry is present. There is good facial sensation to soft touch bilaterally, however she had left allodynia to cold temperature.   Facial strength is normal.  Trapezius and sternocleidomastoid strength is normal. No dysarthria is noted.   No obvious hearing deficits are noted.  Motor:  Muscle bulk is normal.   Tone is normal. Strength is  5 / 5 in all 4 extremities.   Sensory: Sensory testing is intact to soft touch and vibration sensation in all 4 extremities.  She felt mildly more sensitive to cold on the left arm  Coordination: Cerebellar testing reveals good finger-nose-finger and heel-to-shin bilaterally.  Gait and station: Station is normal.   Gait is normal. Tandem gait is mildly wide.  . Romberg is negative.   Reflexes: Deep tendon reflexes are symmetric and normal; 2 in arms ad 3 in legs - no spread.   Plantar responses are flexor.    DIAGNOSTIC DATA (LABS, IMAGING, TESTING) - I reviewed patient records, labs, notes, testing and imaging myself where available.  Lab Results  Component Value Date   WBC 6.4 11/03/2020   HGB 12.9 11/03/2020   HCT 41.2 11/03/2020   MCV 99.0 11/03/2020   PLT 279 11/03/2020      Component Value Date/Time   NA 138 11/03/2020 0821   NA 140 02/11/2020 1513   K 3.7 11/03/2020 0821   CL 102 11/03/2020 0821   CO2 25 11/03/2020 0821   GLUCOSE 99 11/03/2020 0821   BUN 8 11/03/2020 0821   BUN 9 02/11/2020 1513   CREATININE 0.74 11/03/2020 0821   CREATININE 0.75 08/09/2020 1007   CREATININE 0.76 10/15/2013 1329   CALCIUM 9.0 11/03/2020 0821   PROT 6.9 11/01/2020 1035   PROT 7.4 02/11/2020 1513   ALBUMIN 4.1 11/01/2020 1035   ALBUMIN 4.8  02/11/2020 1513   AST 19 11/01/2020 1035   AST 17 08/09/2020 1007   ALT 9 11/01/2020 1035   ALT 9 08/09/2020 1007   ALKPHOS 58 11/01/2020 1035   BILITOT 1.0 11/01/2020 1035   BILITOT 1.4 (H) 08/09/2020 1007   GFRNONAA >60 11/03/2020 0821   GFRNONAA >60 08/09/2020 1007   GFRAA >60 02/29/2020 0847   Lab Results  Component Value Date   CHOL 244 (H) 01/15/2018   HDL 50 01/15/2018   LDLCALC 172 (H) 01/15/2018   TRIG 112 01/15/2018   CHOLHDL 4.9 (H) 01/15/2018   No results found for: HGBA1C Lab Results  Component Value Date   VITAMINB12 331 01/29/2019   Lab Results  Component Value Date   TSH 2.090 09/24/2019       ASSESSMENT AND PLAN  Numbness  Facial pain  Osteoarthritis of cervical spine, unspecified spinal osteoarthritis complication status   She is a 65 yo woman with left sided numbness that is sometimes painful.   Etiology is uncertain as the brain MRI was normal for age.   She has DJD at C3-C4 and possible  spinal stenosis at that level.  We will increase her gabapentin to 300 mg po tid.    If not better in a month, she should call us and we will get a cervical spine MRI to r/o spinal stenosis at C3-C4 where she has known DJD. F/u in 4 months  Humza Tallerico A. Epimenio Foot, MD, [facility] 11/16/2020, 8:35 AM Certified in Neurology, Clinical Neurophysiology, Sleep Medicine and Neuroimaging  [facility] [address], [address] [place], [place] [address] [phone]

## 2020-11-16 ENCOUNTER — Other Ambulatory Visit: Payer: Self-pay

## 2020-11-16 ENCOUNTER — Encounter: Payer: Self-pay | Admitting: Neurology

## 2020-11-16 ENCOUNTER — Ambulatory Visit (INDEPENDENT_AMBULATORY_CARE_PROVIDER_SITE_OTHER): Payer: Medicare Other | Admitting: Neurology

## 2020-11-16 VITALS — BP 133/77 | HR 64 | Ht 64.0 in | Wt 97.0 lb

## 2020-11-16 DIAGNOSIS — R519 Headache, unspecified: Secondary | ICD-10-CM | POA: Diagnosis not present

## 2020-11-16 DIAGNOSIS — M47812 Spondylosis without myelopathy or radiculopathy, cervical region: Secondary | ICD-10-CM

## 2020-11-16 DIAGNOSIS — M4802 Spinal stenosis, cervical region: Secondary | ICD-10-CM | POA: Diagnosis not present

## 2020-11-16 DIAGNOSIS — R200 Anesthesia of skin: Secondary | ICD-10-CM

## 2020-11-16 MED ORDER — GABAPENTIN 300 MG PO CAPS: 300.0000 mg | ORAL_CAPSULE | Freq: Three times a day (TID) | ORAL | 5 refills | Status: DC

## 2020-11-16 NOTE — Patient Instructions (Signed)
Increase gabapentin to three pills a day (morning, dinner, bedtime)

## 2020-11-17 ENCOUNTER — Telehealth: Payer: Self-pay | Admitting: Neurology

## 2020-11-17 NOTE — Telephone Encounter (Signed)
LVM returning pt call.

## 2020-11-17 NOTE — Telephone Encounter (Signed)
Called patient back. She had cataract removed on L eye. Having pain around L eye and down her cheek, underneath eye. She has not picked up increased gabapentin dose of 300mg  po TID from pharmacy. I explained that Dr. Felecia Shelling increased her dose in hopes this would help her sx. She will follow up with pharmacy to get prescription. She asked about MRI cervical ordered. Advised this was ordered to r/o other causes for her sx. She is scheduled to go on 11/19/20. Explained we will call her w/ results once they are back. She verbalized understanding.

## 2020-11-17 NOTE — Telephone Encounter (Signed)
Pt is needing to discuss the pain around her eye with the RN. Please advise.

## 2020-11-17 NOTE — Telephone Encounter (Signed)
Pt returned phone call, would like a call back.

## 2020-11-18 ENCOUNTER — Telehealth (HOSPITAL_COMMUNITY): Payer: Self-pay | Admitting: Emergency Medicine

## 2020-11-18 ENCOUNTER — Encounter (HOSPITAL_COMMUNITY): Payer: Self-pay

## 2020-11-18 ENCOUNTER — Ambulatory Visit (HOSPITAL_COMMUNITY)
Admission: EM | Admit: 2020-11-18 | Discharge: 2020-11-18 | Disposition: A | Discharge status: Home / Self Care | Payer: Medicare Other | Attending: Medical Oncology | Admitting: Medical Oncology

## 2020-11-18 DIAGNOSIS — J029 Acute pharyngitis, unspecified: Secondary | ICD-10-CM

## 2020-11-18 MED ORDER — NYSTATIN 100000 UNIT/ML MT SUSP: 5.0000 mL | Freq: Three times a day (TID) | OROMUCOSAL | 0 refills | Status: DC | PRN

## 2020-11-18 NOTE — Discharge Instructions (Addendum)
Please contact your primary care provider so you can have thyroid lab and imaging.

## 2020-11-18 NOTE — ED Triage Notes (Signed)
Pt c/o swollen glands X 3 weeks. Pt states she has not taken at home medicine.

## 2020-11-18 NOTE — Telephone Encounter (Signed)
Pharmacist called to verify if it is okay to fill Magic mouth wash extra strength instead of regular due to the regular not being in stock. Per Nelwyn Salisbury PA-C its okay. Nothing further needed.

## 2020-11-18 NOTE — ED Provider Notes (Signed)
[place]    CSN: [id number] Arrival date & time: 11/18/20  1323      History   Chief Complaint Chief Complaint  Patient presents with   thyroid swelling    HPI Kimberly [name] is a 65 y.o. female.   HPI  Neck discomfort: Patient states that she has had some enlargement of her thyroid gland for the past 2 weeks or so.  She thinks she may have had this longer but is unsure.  She states that she has some episodes where she has trouble swallowing but mostly has a sore throat.  She states that the nodules are bothersome to her and that she wants something today that can help with her symptoms.  She states that many years ago she did have to have thyroid nodules removed from her thyroid and believes they were benign.  She denies any recent increase in GERD, cough, fever, other cold symptoms.   Past Medical History:  Diagnosis Date   Alcohol abuse, in remission    since around 7106   Alcoholic hepatitis    fatty liver on imaging- Treated Hepatits C and treated   Allergic rhinitis    Anemia    EGD with duodenal AVM, normal colonoscopy 04/2012   Asthma    Callus of foot 2009   Cancer [facility])    Chest pain, musculoskeletal 2011   Cholelithiasis    [place] 09/2008: Cholelithiasis is present, s/p cholecystectomy   Depression    Excessive cerumen in ear canal    since before 2000   GERD (gastroesophageal reflux disease)    History of blood transfusion    Hyperlipidemia    Hypertension    Menopausal syndrome    Treated with estradiol in the past - discontinued 04/2012   MENOPAUSAL SYNDROME 09/24/2006   2008 Note : The pt is adament about having this medication.  She fully understands the increased risk of heart disease and cancer that is associated with HRT and is willing to accept this risk in order to prevent the debilitating hot flashes she has off this medication.  Will continue to encourage her to try to wean herself off of this medication at our next visit.  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 ([place]) 05/12/2020   Mass of upper lobe of right lung 05/03/2020   COVID-19 09/25/2019   History of dysuria 09/24/2019   Rhinosinusitis 08/27/2019   Dysuria 05/25/2019   Tachycardia 05/25/2019   Hematuria 05/25/2019   AVM (arteriovenous malformation) of small bowel, 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   Health care maintenance 03/25/2013   Depression 12/03/2012   Iron deficiency anemia 05/08/2012   Insomnia 05/07/2012   Weight loss 07/27/2009   HLD (hyperlipidemia) 03/25/2008   Constipation 11/04/2007   Essential hypertension 09/24/2006   Allergies 09/24/2006   Asthma 09/24/2006   GERD 09/24/2006    Past Surgical History:  Procedure Laterality Date   ABDOMINAL HYSTERECTOMY  1980s   BIOPSY  03/18/2019   Procedure: BIOPSY;  Surgeon: Irving Copas.,  MD;  Location: [place];  Service: Gastroenterology;;   BRONCHIAL BIOPSY  05/03/2020   Procedure: BRONCHIAL BIOPSIES;  Surgeon: Collene Gobble, MD;  Location: [facility] ENDOSCOPY;  Service: Pulmonary;;   BRONCHIAL BRUSHINGS  05/03/2020   Procedure: BRONCHIAL BRUSHINGS;  Surgeon: Collene Gobble, MD;  Location: [facility] ENDOSCOPY;  Service: Pulmonary;;   BRONCHIAL NEEDLE ASPIRATION BIOPSY  05/03/2020   Procedure: BRONCHIAL NEEDLE ASPIRATION BIOPSIES;  Surgeon: Collene Gobble, MD;  Location: [facility] ENDOSCOPY;  Service: Pulmonary;;   CHOLECYSTECTOMY  01/06/09   COLONOSCOPY  05/09/2012   Procedure: COLONOSCOPY;  Surgeon: Inda Castle, MD;  Location: [place];  Service:  Endoscopy;  Laterality: N/A;   COLONOSCOPY WITH PROPOFOL N/A 03/18/2019   Procedure: COLONOSCOPY WITH PROPOFOL;  Surgeon: Rush Landmark Telford Nab., MD;  Location: [place];  Service: Gastroenterology;  Laterality: N/A;   ENTEROSCOPY N/A 03/18/2019   Procedure: ENTEROSCOPY;  Surgeon: Rush Landmark Telford Nab., MD;  Location: [place];  Service: Gastroenterology;  Laterality: N/A;   ESOPHAGOGASTRODUODENOSCOPY  05/09/2012   Procedure: ESOPHAGOGASTRODUODENOSCOPY (EGD);  Surgeon: Inda Castle, MD;  Location: [place];  Service: Endoscopy;  Laterality: N/A;   FIDUCIAL MARKER PLACEMENT  05/03/2020   Procedure: FIDUCIAL MARKER PLACEMENT;  Surgeon: Collene Gobble, MD;  Location: [facility] ENDOSCOPY;  Service: Pulmonary;;   HEMOSTASIS CLIP PLACEMENT  03/18/2019   Procedure: HEMOSTASIS CLIP PLACEMENT;  Surgeon: Irving Copas., MD;  Location: [place];  Service: Gastroenterology;;   HOT HEMOSTASIS N/A 03/18/2019   Procedure: HOT HEMOSTASIS (ARGON PLASMA COAGULATION/BICAP);  Surgeon: Irving Copas., MD;  Location: [place];  Service: Gastroenterology;  Laterality: N/A;   POLYPECTOMY  03/18/2019   Procedure: POLYPECTOMY;  Surgeon: Rush Landmark Telford Nab., MD;  Location: [place];  Service: Gastroenterology;;   SUBMUCOSAL TATTOO INJECTION  03/18/2019   Procedure: SUBMUCOSAL TATTOO INJECTION;  Surgeon: Irving Copas., MD;  Location: [place];  Service: Gastroenterology;;   VIDEO BRONCHOSCOPY WITH ENDOBRONCHIAL NAVIGATION N/A 05/03/2020   Procedure: VIDEO BRONCHOSCOPY WITH ENDOBRONCHIAL NAVIGATION;  Surgeon: Collene Gobble, MD;  Location: [place] ENDOSCOPY;  Service: Pulmonary;  Laterality: N/A;    OB History   No obstetric history on file.      Home Medications    Prior to Admission medications   Medication Sig Start Date End Date Taking? Authorizing Provider  fluticasone (FLONASE) 50 MCG/ACT nasal spray Use 1-2 sprays per nostril once daily as needed for  sinus congestion or allergy symptoms. 09/20/20   Jeralyn Bennett, MD  fluticasone (FLOVENT HFA) 110 MCG/ACT inhaler Inhale 2 puffs into the lungs 2 (two) times daily. 09/20/20   Jeralyn Bennett, MD  gabapentin (NEURONTIN) 300 MG capsule Take 1 capsule (300 mg total) by mouth 3 (three) times daily. 11/16/20   Sater, Nanine Means, MD  losartan (COZAAR) 25 MG tablet Take 1 tablet (25 mg total) by mouth daily. 08/10/20   Mosetta Anis, MD  pantoprazole (PROTONIX) 40 MG tablet Take 1 tablet (40 mg total) by mouth daily. 09/06/20   Jeralyn Bennett, MD    Family History Family History  Problem Relation Age of Onset   Hypertension Mother    Colon cancer Neg Hx    Esophageal cancer Neg Hx    Inflammatory bowel disease Neg Hx    Liver disease Neg Hx    Pancreatic cancer Neg Hx    Rectal cancer Neg Hx    Stomach cancer Neg Hx     Social History Social History   Tobacco Use   Smoking status: Former  Pack years: 0.00    Types: Cigarettes    Quit date: 08/21/1995    Years since quitting: 25.2   Smokeless tobacco: Never   Tobacco comments:    Patient states she stopped smoking in 31 yrs ago (as of 2022)  Vaping Use   Vaping Use: Never used  Substance Use Topics   Alcohol use: No    Comment: in remission since 2007   Drug use: Not Currently    Comment: previously used marijuana      Allergies   Banana, Grape (artificial) flavor, Ibuprofen, and Penicillins   Review of Systems Review of Systems  As stated above in HPI Physical Exam Triage Vital Signs ED Triage Vitals  Enc Vitals Group     BP 11/18/20 1427 135/74     Pulse Rate 11/18/20 1427 65     Resp 11/18/20 1427 18     Temp 11/18/20 1427 98.2 F (36.8 C)     Temp Source 11/18/20 1427 Oral     SpO2 11/18/20 1427 100 %     Weight --      Height --      Head Circumference --      Peak Flow --      Pain Score 11/18/20 1426 10     Pain Loc --      Pain Edu? --      Excl. in [place]? --    No data found.  Updated Vital  Signs BP 135/74 (BP Location: Right Arm)   Pulse 65   Temp 98.2 F (36.8 C) (Oral)   Resp 18   LMP 08/21/1982   SpO2 100%   Physical Exam Vitals and nursing note reviewed.  HENT:     Mouth/Throat:     Mouth: Mucous membranes are moist.     Pharynx: Oropharynx is clear. No oropharyngeal exudate or posterior oropharyngeal erythema.  Neck:     Comments: Thyroid appears to be normal in size but she does have multiple nodules of bilateral lobes of the thyroid which are nontender to palpation. Cardiovascular:     Rate and Rhythm: Normal rate and regular rhythm.  Pulmonary:     Effort: Pulmonary effort is normal.     Breath sounds: Normal breath sounds.  Musculoskeletal:     Cervical back: Normal range of motion and neck supple.  Lymphadenopathy:     Cervical: No cervical adenopathy.  Skin:    General: Skin is warm.     UC Treatments / Results  Labs (all labs ordered are listed, but only abnormal results are displayed) Labs Reviewed - No data to display  EKG   Radiology No results found.  Procedures Procedures (including critical care time)  Medications Ordered in UC Medications - No data to display  Initial Impression / Assessment and Plan / UC Course  I have reviewed the triage vital signs and the nursing notes.  Pertinent labs & imaging results that were available during my care of the patient were reviewed by me and considered in my medical decision making (see chart for details).     New.  Difficult to assess as she does have thyroid nodules that do need work-up but I am not sure that they are causing her symptoms.  She really wants something to help with her sore throat so for now I am going to trial her on Magic mouthwash as she will continue her Protonix.  She will need to follow-up with her primary care provider within the next  week for symptoms. Final Clinical Impressions(s) / UC Diagnoses   Final diagnoses:  None   Discharge Instructions   None     ED Prescriptions   None    PDMP not reviewed this encounter.   Hughie Closs, [place] 11/18/20 1447

## 2020-11-19 ENCOUNTER — Ambulatory Visit
Admission: RE | Admit: 2020-11-19 | Discharge: 2020-11-19 | Disposition: A | Discharge status: Home / Self Care | Payer: Medicare Other | Source: Ambulatory Visit | Attending: Neurology | Admitting: Neurology

## 2020-11-19 ENCOUNTER — Other Ambulatory Visit: Payer: Self-pay

## 2020-11-19 DIAGNOSIS — R200 Anesthesia of skin: Secondary | ICD-10-CM

## 2020-11-19 DIAGNOSIS — M47812 Spondylosis without myelopathy or radiculopathy, cervical region: Secondary | ICD-10-CM

## 2020-11-23 ENCOUNTER — Telehealth: Payer: Self-pay | Admitting: Neurology

## 2020-11-23 NOTE — Telephone Encounter (Signed)
Called pt back. She wanted to know what was causing burning in leg. Advised MRI did not show reasoning. She should try increased dose of gabapentin for 2-3 weeks as previously discussed. If sx worsen or she develops any new sx, she should let us know. Next steps would be discussed at that point. She verbalized understanding.

## 2020-11-23 NOTE — Telephone Encounter (Signed)
Took call from phone staff and spoke w/ pt. Relayed results per Dr. Garth Bigness note. Pt verbalized understanding. She will try gabapentin dose increase for 2-3 weeks. She will call if she develops any new or worsening sx.

## 2020-11-23 NOTE — Telephone Encounter (Signed)
Pt is asking for a call from [place], [place] re: something she needs to tell Terrence Dupont, RN about the MRI results

## 2020-11-24 NOTE — Telephone Encounter (Signed)
Pt requesting a call back in regards to the burning on her leg. Please advise

## 2020-11-24 NOTE — Telephone Encounter (Signed)
LVM returning pt call.

## 2020-11-24 NOTE — Telephone Encounter (Signed)
Called pt back. Advised it could be nerve pain causing sx. Dr. Felecia Shelling increased her gabapentin dose. She should try this increased dose for 2-3 weeks to reach full effectiveness before determining if it is helping. She will call back with update then.

## 2020-11-25 ENCOUNTER — Ambulatory Visit (HOSPITAL_COMMUNITY)
Admission: EM | Admit: 2020-11-25 | Discharge: 2020-11-25 | Disposition: A | Discharge status: Home / Self Care | Payer: Medicare Other | Attending: Physician Assistant | Admitting: Physician Assistant

## 2020-11-25 ENCOUNTER — Other Ambulatory Visit: Payer: Self-pay

## 2020-11-25 ENCOUNTER — Encounter (HOSPITAL_COMMUNITY): Payer: Self-pay | Admitting: Emergency Medicine

## 2020-11-25 DIAGNOSIS — R1310 Dysphagia, unspecified: Secondary | ICD-10-CM | POA: Diagnosis not present

## 2020-11-25 DIAGNOSIS — R198 Other specified symptoms and signs involving the digestive system and abdomen: Secondary | ICD-10-CM | POA: Diagnosis present

## 2020-11-25 DIAGNOSIS — E0789 Other specified disorders of thyroid: Secondary | ICD-10-CM

## 2020-11-25 DIAGNOSIS — R0989 Other specified symptoms and signs involving the circulatory and respiratory systems: Secondary | ICD-10-CM

## 2020-11-25 LAB — CBC WITH DIFFERENTIAL/PLATELET
Abs Immature Granulocytes: 0.01 10*3/uL (ref 0.00–0.07)
Basophils Absolute: 0 10*3/uL (ref 0.0–0.1)
Basophils Relative: 1 %
Eosinophils Absolute: 0.1 10*3/uL (ref 0.0–0.5)
Eosinophils Relative: 2 %
HCT: 38.8 % (ref 36.0–46.0)
Hemoglobin: 12.2 g/dL (ref 12.0–15.0)
Immature Granulocytes: 0 %
Lymphocytes Relative: 22 %
Lymphs Abs: 1.1 10*3/uL (ref 0.7–4.0)
MCH: 30.9 pg (ref 26.0–34.0)
MCHC: 31.4 g/dL (ref 30.0–36.0)
MCV: 98.2 fL (ref 80.0–100.0)
Monocytes Absolute: 0.5 10*3/uL (ref 0.1–1.0)
Monocytes Relative: 9 %
Neutro Abs: 3.4 10*3/uL (ref 1.7–7.7)
Neutrophils Relative %: 66 %
Platelets: 285 10*3/uL (ref 150–400)
RBC: 3.95 MIL/uL (ref 3.87–5.11)
RDW: 12.1 % (ref 11.5–15.5)
WBC: 5 10*3/uL (ref 4.0–10.5)
nRBC: 0 % (ref 0.0–0.2)

## 2020-11-25 LAB — COMPREHENSIVE METABOLIC PANEL
ALT: 13 U/L (ref 0–44)
AST: 23 U/L (ref 15–41)
Albumin: 4.1 g/dL (ref 3.5–5.0)
Alkaline Phosphatase: 63 U/L (ref 38–126)
Anion gap: 8 (ref 5–15)
BUN: 6 mg/dL — ABNORMAL LOW (ref 8–23)
CO2: 30 mmol/L (ref 22–32)
Calcium: 9.6 mg/dL (ref 8.9–10.3)
Chloride: 104 mmol/L (ref 98–111)
Creatinine, Ser: 0.79 mg/dL (ref 0.44–1.00)
GFR, Estimated: >60 mL/min (ref >60)
Glucose, Bld: 94 mg/dL (ref 70–99)
Potassium: 5 mmol/L (ref 3.5–5.1)
Sodium: 142 mmol/L (ref 135–145)
Total Bilirubin: 1.2 mg/dL (ref 0.3–1.2)
Total Protein: 6.9 g/dL (ref 6.5–8.1)

## 2020-11-25 LAB — SEDIMENTATION RATE: Sed Rate: 4 mm/hr (ref 0–22)

## 2020-11-25 LAB — TSH: TSH: 1.868 u[IU]/mL (ref 0.350–4.500)

## 2020-11-25 MED ORDER — PREDNISONE 10 MG PO TABS: 20.0000 mg | ORAL_TABLET | Freq: Every day | ORAL | 0 refills | Status: AC

## 2020-11-25 NOTE — ED Provider Notes (Signed)
[facility]    CSN: [id number] Arrival date & time: 11/25/20  1048      History   Chief Complaint Chief Complaint  Patient presents with   Neck Pain   Sore Throat    HPI Kimberly [name] is a 65 y.o. female.   Patient presents today with persistent sore throat and dysphagia.  She was seen by our clinic 11/18/2020 at which point she was given Magic mouthwash and instructed to use Protonix.  Despite use of these medications she continues to have significant pain.  She reports pain is rated 10 on a 0-10 pain scale, localized to posterior neck with radiation along sternocleidomastoid bilaterally, present with attempted swallowing, no alleviating factors identified.  She has tried Tylenol without improvement of symptoms.  Reports that she is able to eat and drink despite symptoms but it is challenging as result of pain.  She denies any muffled voice, shortness of breath, inability to swallow.  She is unable to take NSAIDs due to allergy.  She does report associated heat intolerance and weight loss.  She denies history of thyroid condition.  Denies associated fevers, congestion, cough, shortness of breath.  She has not seen her PCP recently or had any imaging of her thyroid/neck.   Past Medical History:  Diagnosis Date   Alcohol abuse, in remission    since around 2005   Alcoholic hepatitis    fatty liver on imaging- Treated Hepatits C and treated   Allergic rhinitis    Anemia    EGD with duodenal AVM, normal colonoscopy 04/2012   Asthma    Callus of foot 2009   Cancer [facility])    Chest pain, musculoskeletal 2011   Cholelithiasis    [place] 09/2008: Cholelithiasis is present, s/p cholecystectomy   Depression    Excessive cerumen in ear canal    since before 2000   GERD (gastroesophageal reflux disease)    History of blood transfusion    Hyperlipidemia    Hypertension    Menopausal syndrome    Treated with estradiol in the past - discontinued 04/2012   MENOPAUSAL SYNDROME  09/24/2006   2008 Note : The pt is adament about having this medication.  She fully understands the increased risk of heart disease and cancer that is associated with HRT and is willing to accept this risk in order to prevent the debilitating hot flashes she has off this medication.  Will continue to encourage her to try to wean herself off of this medication at our next visit.  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) 05/12/2020   Mass of upper lobe of right lung 05/03/2020   COVID-19 09/25/2019   History of dysuria 09/24/2019   Rhinosinusitis 08/27/2019   Dysuria 05/25/2019   Tachycardia 05/25/2019   Hematuria 05/25/2019   AVM (arteriovenous malformation) of small bowel, 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   Health care maintenance 03/25/2013   Depression 12/03/2012   Iron deficiency anemia 05/08/2012   Insomnia 05/07/2012   Weight loss 07/27/2009  HLD (hyperlipidemia) 03/25/2008   Constipation 11/04/2007   Essential hypertension 09/24/2006   Allergies 09/24/2006   Asthma 09/24/2006   GERD 09/24/2006    Past Surgical History:  Procedure Laterality Date   ABDOMINAL HYSTERECTOMY  1980s   BIOPSY  03/18/2019   Procedure: BIOPSY;  Surgeon: Irving Copas., MD;  Location: [place];  Service: Gastroenterology;;   BRONCHIAL BIOPSY  05/03/2020   Procedure: BRONCHIAL BIOPSIES;  Surgeon: Collene Gobble, MD;  Location: [facility] ENDOSCOPY;  Service: Pulmonary;;   BRONCHIAL BRUSHINGS  05/03/2020   Procedure: BRONCHIAL BRUSHINGS;  Surgeon: Collene Gobble, MD;  Location: [facility] ENDOSCOPY;  Service: Pulmonary;;   BRONCHIAL NEEDLE ASPIRATION BIOPSY  05/03/2020   Procedure: BRONCHIAL NEEDLE ASPIRATION BIOPSIES;  Surgeon: Collene Gobble, MD;  Location: [facility] ENDOSCOPY;  Service: Pulmonary;;   CHOLECYSTECTOMY  01/06/09   COLONOSCOPY  05/09/2012   Procedure: COLONOSCOPY;  Surgeon: Inda Castle, MD;  Location: [facility] ENDOSCOPY;  Service: Endoscopy;  Laterality: N/A;   COLONOSCOPY WITH PROPOFOL N/A 03/18/2019   Procedure: COLONOSCOPY WITH PROPOFOL;  Surgeon: Rush Landmark Telford Nab., MD;  Location: [place];  Service: Gastroenterology;  Laterality: N/A;   ENTEROSCOPY N/A 03/18/2019   Procedure: ENTEROSCOPY;  Surgeon: Rush Landmark Telford Nab., MD;  Location: [place];  Service: Gastroenterology;  Laterality: N/A;   ESOPHAGOGASTRODUODENOSCOPY  05/09/2012   Procedure: ESOPHAGOGASTRODUODENOSCOPY (EGD);  Surgeon: Inda Castle, MD;  Location: [place];  Service: Endoscopy;  Laterality: N/A;   FIDUCIAL MARKER PLACEMENT  05/03/2020   Procedure: FIDUCIAL MARKER PLACEMENT;  Surgeon: Collene Gobble, MD;  Location: [facility] ENDOSCOPY;  Service: Pulmonary;;   HEMOSTASIS CLIP PLACEMENT  03/18/2019   Procedure: HEMOSTASIS CLIP PLACEMENT;  Surgeon: Irving Copas., MD;  Location: [place];  Service: Gastroenterology;;   HOT HEMOSTASIS N/A 03/18/2019   Procedure: HOT HEMOSTASIS (ARGON PLASMA COAGULATION/BICAP);  Surgeon: Irving Copas., MD;  Location: [place];  Service: Gastroenterology;  Laterality: N/A;   POLYPECTOMY  03/18/2019   Procedure: POLYPECTOMY;  Surgeon: Rush Landmark Telford Nab., MD;  Location: [place];  Service: Gastroenterology;;   SUBMUCOSAL TATTOO INJECTION  03/18/2019   Procedure: SUBMUCOSAL TATTOO INJECTION;  Surgeon: Irving Copas., MD;  Location: [place];  Service: Gastroenterology;;   VIDEO BRONCHOSCOPY WITH ENDOBRONCHIAL NAVIGATION N/A 05/03/2020   Procedure: VIDEO BRONCHOSCOPY WITH ENDOBRONCHIAL  NAVIGATION;  Surgeon: Collene Gobble, MD;  Location: [place] ENDOSCOPY;  Service: Pulmonary;  Laterality: N/A;    OB History   No obstetric history on file.      Home Medications    Prior to Admission medications   Medication Sig Start Date End Date Taking? Authorizing Provider  predniSONE (DELTASONE) 10 MG tablet Take 2 tablets (20 mg total) by mouth daily for 4 days. 11/25/20 11/29/20 Yes Kindle Strohmeier K, PA-C  fluticasone (FLONASE) 50 MCG/ACT nasal spray Use 1-2 sprays per nostril once daily as needed for sinus congestion or allergy symptoms. 09/20/20   Jeralyn Bennett, MD  fluticasone (FLOVENT HFA) 110 MCG/ACT inhaler Inhale 2 puffs into the lungs 2 (two) times daily. 09/20/20   Jeralyn Bennett, MD  gabapentin (NEURONTIN) 300 MG capsule Take 1 capsule (300 mg total) by mouth 3 (three) times daily. 11/16/20   Sater, Nanine Means, MD  losartan (COZAAR) 25 MG tablet Take 1 tablet (25 mg total) by mouth daily. 08/10/20   Mosetta Anis, MD  magic mouthwash (nystatin, lidocaine, diphenhydrAMINE, alum & mag hydroxide) suspension Swish and swallow 5 mLs 3 (three) times daily as needed for mouth pain. 11/18/20  Nelwyn Salisbury M, PA-C  pantoprazole (PROTONIX) 40 MG tablet Take 1 tablet (40 mg total) by mouth daily. 09/06/20   Jeralyn Bennett, MD    Family History Family History  Problem Relation Age of Onset   Hypertension Mother    Colon cancer Neg Hx    Esophageal cancer Neg Hx    Inflammatory bowel disease Neg Hx    Liver disease Neg Hx    Pancreatic cancer Neg Hx    Rectal cancer Neg Hx    Stomach cancer Neg Hx     Social History Social History   Tobacco Use   Smoking status: Former    Pack years: 0.00    Types: Cigarettes    Quit date: 08/21/1995    Years since quitting: 25.2   Smokeless tobacco: Never   Tobacco comments:    Patient states she stopped smoking in 31 yrs ago (as of 2022)  Vaping Use   Vaping Use: Never used  Substance Use Topics   Alcohol use: No    Comment: in  remission since 2007   Drug use: Not Currently    Comment: previously used marijuana      Allergies   Banana, Grape (artificial) flavor, Ibuprofen, and Penicillins   Review of Systems Review of Systems  Constitutional:  Positive for appetite change and unexpected weight change (loss). Negative for activity change, fatigue and fever.  HENT:  Positive for sore throat and trouble swallowing. Negative for congestion, sinus pressure, sneezing and voice change.   Respiratory:  Negative for cough and shortness of breath.   Cardiovascular:  Negative for chest pain.  Gastrointestinal:  Negative for abdominal pain, diarrhea, nausea and vomiting.  Neurological:  Negative for dizziness, light-headedness and headaches.    Physical Exam Triage Vital Signs ED Triage Vitals  Enc Vitals Group     BP 11/25/20 1203 (!) 144/58     Pulse Rate 11/25/20 1203 66     Resp 11/25/20 1203 17     Temp 11/25/20 1203 97.8 F (36.6 C)     Temp Source 11/25/20 1203 Oral     SpO2 11/25/20 1203 100 %     Weight --      Height --      Head Circumference --      Peak Flow --      Pain Score 11/25/20 1206 10     Pain Loc --      Pain Edu? --      Excl. in [place]? --    No data found.  Updated Vital Signs BP (!) 144/58 (BP Location: Left Arm)   Pulse 66   Temp 97.8 F (36.6 C) (Oral)   Resp 17   LMP 08/21/1982   SpO2 100%   Visual Acuity Right Eye Distance:   Left Eye Distance:   Bilateral Distance:    Right Eye Near:   Left Eye Near:    Bilateral Near:     Physical Exam Vitals reviewed.  Constitutional:      General: She is awake. She is not in acute distress.    Appearance: Normal appearance. She is normal weight. She is not ill-appearing.     Comments: Very pleasant female appears stated age in no acute distress sitting comfortably on exam table  HENT:     Head: Normocephalic and atraumatic.     Right Ear: Tympanic membrane, ear canal and external ear normal. Tympanic membrane is not  erythematous or bulging.     Left Ear:  Tympanic membrane, ear canal and external ear normal. Tympanic membrane is not erythematous or bulging.     Nose:     Right Sinus: No maxillary sinus tenderness or frontal sinus tenderness.     Left Sinus: No maxillary sinus tenderness or frontal sinus tenderness.     Mouth/Throat:     Pharynx: Uvula midline. Posterior oropharyngeal erythema present. No oropharyngeal exudate.     Comments: Moderate erythema in posterior oropharynx Neck:     Thyroid: Thyroid mass and thyroid tenderness present. No thyromegaly.     Comments: Thyroid nodule noted on right.  Thyroid is generally tender to palpation without significant thyromegaly. Cardiovascular:     Rate and Rhythm: Normal rate and regular rhythm.     Heart sounds: Normal heart sounds, S1 normal and S2 normal. No murmur heard. Pulmonary:     Effort: Pulmonary effort is normal.     Breath sounds: Normal breath sounds. No wheezing, rhonchi or rales.  Lymphadenopathy:     Head:     Right side of head: No submental, submandibular or tonsillar adenopathy.     Left side of head: No submental, submandibular or tonsillar adenopathy.     Cervical: No cervical adenopathy.  Psychiatric:        Behavior: Behavior is cooperative.     UC Treatments / Results  Labs (all labs ordered are listed, but only abnormal results are displayed) Labs Reviewed  CBC WITH DIFFERENTIAL/PLATELET  COMPREHENSIVE METABOLIC PANEL  SEDIMENTATION RATE  TSH  T4    EKG   Radiology No results found.  Procedures Procedures (including critical care time)  Medications Ordered in UC Medications - No data to display  Initial Impression / Assessment and Plan / UC Course  I have reviewed the triage vital signs and the nursing notes.  Pertinent labs & imaging results that were available during my care of the patient were reviewed by me and considered in my medical decision making (see chart for details).      Unclear  etiology of symptoms but given tenderness over her thyroid concern for thyroiditis as possible etiology of symptoms.  Basic labs including CBC, CMP, ESR, TSH, T4 obtained today-results pending.  Patient is unable to take NSAIDs so we will try low-dose prednisone (20 mg for 4 days) to see if this will improve symptoms.  She denies any history of diabetes.  Discussed that she would likely need imaging including thyroid ultrasound versus CT of neck which cannot be arranged through urgent care.  She was given contact information for ENT and instructed to follow-up with specialist for ongoing evaluation.  Discussed alarm symptoms that warrant emergent evaluation to which patient expressed understanding.  Strict return precautions given to which patient expressed understanding.  She was instructed to follow-up with her primary care provider within 1 week for further evaluation and ongoing management.  Final Clinical Impressions(s) / UC Diagnoses   Final diagnoses:  Dysphagia, unspecified type  Globus sensation  Thyroid pain     Discharge Instructions      We are going to try prednisone 20 mg daily for 4 days to see if this will help improve inflammation in your symptoms.  Do not take any NSAIDs (aspirin/ibuprofen/naproxen) with this medication.  We will contact you if your lab work is abnormal.  Given your persistent symptoms I think it is important to follow-up with an ear nose and throat doctor as we discussed.  Please call them to schedule an appointment.  If you have any  worsening symptoms including swelling of your throat/mouth, inability to swallow, changes to your voice, fever you need to go to the emergency room for imaging.     ED Prescriptions     Medication Sig Dispense Auth. Provider   predniSONE (DELTASONE) 10 MG tablet Take 2 tablets (20 mg total) by mouth daily for 4 days. 8 tablet Anniemae Haberkorn, Derry Skill, PA-C      PDMP not reviewed this encounter.   Terrilee Croak, PA-C 11/25/20  1226

## 2020-11-25 NOTE — ED Triage Notes (Signed)
Pt presents for neck pain and sore throat. Was seen on 6/24 and states mouth wash is not helping.

## 2020-11-25 NOTE — Discharge Instructions (Signed)
We are going to try prednisone 20 mg daily for 4 days to see if this will help improve inflammation in your symptoms.  Do not take any NSAIDs (aspirin/ibuprofen/naproxen) with this medication.  We will contact you if your lab work is abnormal.  Given your persistent symptoms I think it is important to follow-up with an ear nose and throat doctor as we discussed.  Please call them to schedule an appointment.  If you have any worsening symptoms including swelling of your throat/mouth, inability to swallow, changes to your voice, fever you need to go to the emergency room for imaging.

## 2020-11-26 LAB — T4: T4, Total: 8.4 ug/dL (ref 4.5–12.0)

## 2020-11-30 ENCOUNTER — Ambulatory Visit (HOSPITAL_COMMUNITY)
Admission: EM | Admit: 2020-11-30 | Discharge: 2020-11-30 | Disposition: A | Discharge status: Home / Self Care | Payer: Medicare Other | Attending: Medical Oncology | Admitting: Medical Oncology

## 2020-11-30 ENCOUNTER — Encounter (HOSPITAL_COMMUNITY): Payer: Self-pay

## 2020-11-30 ENCOUNTER — Other Ambulatory Visit: Payer: Self-pay

## 2020-11-30 DIAGNOSIS — M542 Cervicalgia: Secondary | ICD-10-CM | POA: Diagnosis not present

## 2020-11-30 MED ORDER — PREDNISONE 10 MG PO TABS: 20.0000 mg | ORAL_TABLET | Freq: Every day | ORAL | 0 refills | Status: AC

## 2020-11-30 NOTE — ED Triage Notes (Signed)
Pt c/o pain to right side of neck, difficult to swallow per patient, states she does have SOB (appears in no distress at this time) at times states she is going to ENT this month.  States she is having problems with her thyroid.  Started: ongoing per patient

## 2020-11-30 NOTE — ED Provider Notes (Signed)
[place]    CSN: [id number] Arrival date & time: 11/30/20  6063      History   Chief Complaint Chief Complaint  Patient presents with   thyroid    HPI Kimberly [name] is a 65 y.o. female.   HPI  Thyroid Concern: Patient reports that she has been having pain of the right side of her neck on the dorsal side of her SCM muscle, some difficulty swallowing and some shortness of breath sensation for the past few months.  She reports that she has an appointment with ENT this month to discuss these concerns but wanted to see if there was anything we could do before her appointment. She has been trailed on Protonix and Magic Mouthwash without any relief.  She believes that this is a problem with her thyroid but has no history of thyroid disease. She denies trouble with secretions, oral pain, chocking sensation, SOB of lungs, fever.   Past Medical History:  Diagnosis Date   Alcohol abuse, in remission    since around 0160   Alcoholic hepatitis    fatty liver on imaging- Treated Hepatits C and treated   Allergic rhinitis    Anemia    EGD with duodenal AVM, normal colonoscopy 04/2012   Asthma    Callus of foot 2009   Cancer [facility])    Chest pain, musculoskeletal 2011   Cholelithiasis    [place] 09/2008: Cholelithiasis is present, s/p cholecystectomy   Depression    Excessive cerumen in ear canal    since before 2000   GERD (gastroesophageal reflux disease)    History of blood transfusion    Hyperlipidemia    Hypertension    Menopausal syndrome    Treated with estradiol in the past - discontinued 04/2012   MENOPAUSAL SYNDROME 09/24/2006   2008 Note : The pt is adament about having this medication.  She fully understands the increased risk of heart disease and cancer that is associated with HRT and is willing to accept this risk in order to prevent the debilitating hot flashes she has off this medication.  Will continue to encourage her to try to wean herself off of this  medication at our next visit.  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 ([place]) 05/12/2020   Mass of upper lobe of right lung 05/03/2020   COVID-19 09/25/2019   History of dysuria 09/24/2019   Rhinosinusitis 08/27/2019   Dysuria 05/25/2019   Tachycardia 05/25/2019   Hematuria 05/25/2019   AVM (arteriovenous malformation) of small bowel, 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   Health care maintenance 03/25/2013   Depression 12/03/2012   Iron deficiency anemia 05/08/2012   Insomnia 05/07/2012   Weight loss 07/27/2009   HLD (hyperlipidemia) 03/25/2008   Constipation 11/04/2007   Essential hypertension 09/24/2006   Allergies 09/24/2006   Asthma 09/24/2006   GERD 09/24/2006    Past Surgical History:  Procedure Laterality Date   ABDOMINAL HYSTERECTOMY  1980s   BIOPSY  03/18/2019   Procedure: BIOPSY;  Surgeon: Justice Britain  Brooke Bonito., MD;  Location: [place];  Service: Gastroenterology;;   BRONCHIAL BIOPSY  05/03/2020   Procedure: BRONCHIAL BIOPSIES;  Surgeon: Collene Gobble, MD;  Location: [facility] ENDOSCOPY;  Service: Pulmonary;;   BRONCHIAL BRUSHINGS  05/03/2020   Procedure: BRONCHIAL BRUSHINGS;  Surgeon: Collene Gobble, MD;  Location: [facility] ENDOSCOPY;  Service: Pulmonary;;   BRONCHIAL NEEDLE ASPIRATION BIOPSY  05/03/2020   Procedure: BRONCHIAL NEEDLE ASPIRATION BIOPSIES;  Surgeon: Collene Gobble, MD;  Location[facility]COPY;  Service: Pulmonary;;   CHOLECYSTECTOMY  01/06/09   COLONOSCOPY  05/09/2012   Procedure: COLONOSCOPY;  Surgeon: Inda Castle, MD;   Location: [place];  Service: Endoscopy;  Laterality: N/A;   COLONOSCOPY WITH PROPOFOL N/A 03/18/2019   Procedure: COLONOSCOPY WITH PROPOFOL;  Surgeon: Rush Landmark Telford Nab., MD;  Location: [place];  Service: Gastroenterology;  Laterality: N/A;   ENTEROSCOPY N/A 03/18/2019   Procedure: ENTEROSCOPY;  Surgeon: Rush Landmark Telford Nab., MD;  Location: [place];  Service: Gastroenterology;  Laterality: N/A;   ESOPHAGOGASTRODUODENOSCOPY  05/09/2012   Procedure: ESOPHAGOGASTRODUODENOSCOPY (EGD);  Surgeon: Inda Castle, MD;  Location: [place];  Service: Endoscopy;  Laterality: N/A;   FIDUCIAL MARKER PLACEMENT  05/03/2020   Procedure: FIDUCIAL MARKER PLACEMENT;  Surgeon: Collene Gobble, MD;  Location: [facility] ENDOSCOPY;  Service: Pulmonary;;   HEMOSTASIS CLIP PLACEMENT  03/18/2019   Procedure: HEMOSTASIS CLIP PLACEMENT;  Surgeon: Irving Copas., MD;  Location: [place];  Service: Gastroenterology;;   HOT HEMOSTASIS N/A 03/18/2019   Procedure: HOT HEMOSTASIS (ARGON PLASMA COAGULATION/BICAP);  Surgeon: Irving Copas., MD;  Location: [place];  Service: Gastroenterology;  Laterality: N/A;   POLYPECTOMY  03/18/2019   Procedure: POLYPECTOMY;  Surgeon: Rush Landmark Telford Nab., MD;  Location: [place];  Service: Gastroenterology;;   SUBMUCOSAL TATTOO INJECTION  03/18/2019   Procedure: SUBMUCOSAL TATTOO INJECTION;  Surgeon: Irving Copas., MD;  Location: [place];  Service: Gastroenterology;;   VIDEO BRONCHOSCOPY WITH ENDOBRONCHIAL NAVIGATION N/A 05/03/2020   Procedure: VIDEO BRONCHOSCOPY WITH ENDOBRONCHIAL NAVIGATION;  Surgeon: Collene Gobble, MD;  Location: [place] ENDOSCOPY;  Service: Pulmonary;  Laterality: N/A;    OB History   No obstetric history on file.      Home Medications    Prior to Admission medications   Medication Sig Start Date End Date Taking? Authorizing Provider  fluticasone (FLONASE) 50 MCG/ACT nasal spray Use 1-2 sprays per  nostril once daily as needed for sinus congestion or allergy symptoms. 09/20/20   Jeralyn Bennett, MD  fluticasone (FLOVENT HFA) 110 MCG/ACT inhaler Inhale 2 puffs into the lungs 2 (two) times daily. 09/20/20   Jeralyn Bennett, MD  gabapentin (NEURONTIN) 300 MG capsule Take 1 capsule (300 mg total) by mouth 3 (three) times daily. 11/16/20   Sater, Nanine Means, MD  losartan (COZAAR) 25 MG tablet Take 1 tablet (25 mg total) by mouth daily. 08/10/20   Mosetta Anis, MD  magic mouthwash (nystatin, lidocaine, diphenhydrAMINE, alum & mag hydroxide) suspension Swish and swallow 5 mLs 3 (three) times daily as needed for mouth pain. 11/18/20   Hughie Closs, PA-C  pantoprazole (PROTONIX) 40 MG tablet Take 1 tablet (40 mg total) by mouth daily. 09/06/20   Jeralyn Bennett, MD    Family History Family History  Problem Relation Age of Onset   Hypertension Mother    Colon cancer Neg Hx    Esophageal cancer Neg Hx    Inflammatory bowel disease Neg Hx    Liver disease Neg Hx    Pancreatic cancer Neg  Hx    Rectal cancer Neg Hx    Stomach cancer Neg Hx     Social History Social History   Tobacco Use   Smoking status: Former    Pack years: 0.00    Types: Cigarettes    Quit date: 08/21/1995    Years since quitting: 25.2   Smokeless tobacco: Never   Tobacco comments:    Patient states she stopped smoking in 31 yrs ago (as of 2022)  Vaping Use   Vaping Use: Never used  Substance Use Topics   Alcohol use: No    Comment: in remission since 2007   Drug use: Not Currently    Comment: previously used marijuana      Allergies   Banana, Grape (artificial) flavor, Ibuprofen, and Penicillins   Review of Systems Review of Systems  As stated above in HPI Physical Exam Triage Vital Signs ED Triage Vitals  Enc Vitals Group     BP 11/30/20 0903 125/73     Pulse Rate 11/30/20 0903 72     Resp --      Temp 11/30/20 0903 98 F (36.7 C)     Temp Source 11/30/20 0903 Oral     SpO2 11/30/20 0903 98  %     Weight --      Height --      Head Circumference --      Peak Flow --      Pain Score 11/30/20 0859 10     Pain Loc --      Pain Edu? --      Excl. in [place]? --    No data found.  Updated Vital Signs BP 125/73 (BP Location: Left Arm)   Pulse 72   Temp 98 F (36.7 C) (Oral)   LMP 08/21/1982   SpO2 98%   Physical Exam Vitals and nursing note reviewed.  Constitutional:      General: She is not in acute distress.    Appearance: Normal appearance. She is not ill-appearing, toxic-appearing or diaphoretic.     Comments: No stridor, no raspy voice, hoarseness. No trouble with secretions.   Neck:     Comments: No palpable masses of concern. Mild reproducible tenderness of the posterior cervical lymphatic chain of the right neck. No apparent thyromegaly or thyroid nodules palpable.  Cardiovascular:     Rate and Rhythm: Normal rate and regular rhythm.     Heart sounds: Normal heart sounds.  Pulmonary:     Effort: Pulmonary effort is normal.     Breath sounds: Normal breath sounds.  Musculoskeletal:     Cervical back: Neck supple. No rigidity or tenderness.  Neurological:     Mental Status: She is alert.     UC Treatments / Results  Labs (all labs ordered are listed, but only abnormal results are displayed) Labs Reviewed - No data to display  EKG   Radiology No results found.  Procedures Procedures (including critical care time)  Medications Ordered in UC Medications - No data to display  Initial Impression / Assessment and Plan / UC Course  I have reviewed the triage vital signs and the nursing notes.  Pertinent labs & imaging results that were available during my care of the patient were reviewed by me and considered in my medical decision making (see chart for details).     New.  I discussed with patient that her area of tenderness is where her lymphatic changes although I do not feel a palpable significantly enlarged  lymph node.  We discussed that ideally  she would wait until her ENT appointment as she may need studies such as a barium swallow, etc.  We discussed red flag signs and symptoms.  Patient requested any medication to try to give her some relief until her appointment.  We will trial her on low-dose steroid to see if this helps with her symptoms.  We discussed red flag signs and symptoms.  We also discussed her shortness of breath in terms of her history with her lung nodule.  She declines additional work-up for this at this time.  Final Clinical Impressions(s) / UC Diagnoses   Final diagnoses:  None   Discharge Instructions   None    ED Prescriptions   None    PDMP not reviewed this encounter.   Hughie Closs, [place] 11/30/20 [phone]

## 2020-12-02 ENCOUNTER — Emergency Department (HOSPITAL_COMMUNITY)
Admission: EM | Admit: 2020-12-02 | Discharge: 2020-12-03 | Disposition: A | Discharge status: Home / Self Care | Payer: Medicare Other | Attending: Emergency Medicine | Admitting: Emergency Medicine

## 2020-12-02 ENCOUNTER — Encounter (HOSPITAL_COMMUNITY): Payer: Self-pay | Admitting: Emergency Medicine

## 2020-12-02 ENCOUNTER — Emergency Department (HOSPITAL_COMMUNITY): Payer: Medicare Other

## 2020-12-02 ENCOUNTER — Other Ambulatory Visit: Payer: Self-pay

## 2020-12-02 DIAGNOSIS — Z20822 Contact with and (suspected) exposure to covid-19: Secondary | ICD-10-CM | POA: Diagnosis not present

## 2020-12-02 DIAGNOSIS — J45909 Unspecified asthma, uncomplicated: Secondary | ICD-10-CM | POA: Insufficient documentation

## 2020-12-02 DIAGNOSIS — Z859 Personal history of malignant neoplasm, unspecified: Secondary | ICD-10-CM | POA: Diagnosis not present

## 2020-12-02 DIAGNOSIS — M542 Cervicalgia: Secondary | ICD-10-CM | POA: Diagnosis present

## 2020-12-02 DIAGNOSIS — Z87891 Personal history of nicotine dependence: Secondary | ICD-10-CM | POA: Insufficient documentation

## 2020-12-02 DIAGNOSIS — I10 Essential (primary) hypertension: Secondary | ICD-10-CM | POA: Insufficient documentation

## 2020-12-02 NOTE — ED Provider Notes (Signed)
Emergency Medicine Provider Triage Evaluation Note  Kimberly [name] , a 65 y.o. female  was evaluated in triage.  Patient is a 65 year old female presenting to the ER today with continued complaints of neck pain and difficulty swallowing has been ongoing for 3 months.  She states that it got worse progressively and she cannot wait for her ENT appointment 7/22  Denies any drooling.  No changes in her voice.  She states she is concerned that it is her thyroid.  Review of Systems  Positive: Neck swelling Negative: Fever  Physical Exam  BP 124/67   Pulse 62   Resp 16   LMP 08/21/1982   SpO2 98%  Gen:   Awake, no distress   Resp:  Normal effort  MSK:   Moves extremities without difficulty  Other:  Patient is a 65 year old does not appear to be in acute distress. Managing her secretions. Speaking in full sentences.  Trachea midline  Medical Decision Making  Medically screening exam initiated at 7:01 PM.  Appropriate orders placed.  Kimberly [name] was informed that the remainder of the evaluation will be completed by another provider, this initial triage assessment does not replace that evaluation, and the importance of remaining in the ED until their evaluation is complete.  I reviewed patient's EMR it appears that at the end of this month proximately a week ago she had normal TSH, thyroid panel, CBC metabolic panel.  There were no abnormalities here.  She is here for continued discomfort.     Kimberly [name], [place] 12/02/20 Kimberly [name]    Kimberly Chapel, MD 12/02/20 2140

## 2020-12-02 NOTE — ED Triage Notes (Signed)
Pt via POV c/o neck swelling x3 months along with difficulty swallowing, with decrease intake. Airway intact. No recent surgery. Tonsils removed several years ago.

## 2020-12-02 NOTE — ED Provider Notes (Signed)
[facility] Emergency Department Provider Note MRN:  [id number]  Arrival date & time: 12/03/20     Chief Complaint   Neck Pain   History of Present Illness   Kimberly [name] is a 65 y.o. year-old female with a history of alcohol use disorder, hypertension, GERD presenting to the ED with chief complaint of neck pain.  Persistent right-sided neck pain or fullness for the past 2 or 3 months.  Multiple evaluations, scheduled to see ENT in a few weeks.  Seems to be worsening, feeling a fullness, tenderness on the right side.  Also endorsing unintentional weight loss over the past few weeks.  Currently denies chest pain or shortness of breath, no abdominal pain, no headache, no other complaints.  Review of Systems  A complete 10 system review of systems was obtained and all systems are negative except as noted in the HPI and PMH.   Patient's Health History    Past Medical History:  Diagnosis Date   Alcohol abuse, in remission    since around 9937   Alcoholic hepatitis    fatty liver on imaging- Treated Hepatits C and treated   Allergic rhinitis    Anemia    EGD with duodenal AVM, normal colonoscopy 04/2012   Asthma    Callus of foot 2009   Cancer [facility])    Chest pain, musculoskeletal 2011   Cholelithiasis    [place] 09/2008: Cholelithiasis is present, s/p cholecystectomy   Depression    Excessive cerumen in ear canal    since before 2000   GERD (gastroesophageal reflux disease)    History of blood transfusion    Hyperlipidemia    Hypertension    Menopausal syndrome    Treated with estradiol in the past - discontinued 04/2012   MENOPAUSAL SYNDROME 09/24/2006   2008 Note : The pt is adament about having this medication.  She fully understands the increased risk of heart disease and cancer that is associated with HRT and is willing to accept this risk in order to prevent the debilitating hot flashes she has off this medication.  Will continue to encourage her to  try to wean herself off of this medication at our next visit.  2009 note indicated that she tr   Otitis externa, acute Feb 2012   bilateral, treated with azithromycin after failure of neomycin drops   Otitis media, acute Feb 2012   PONV (postoperative nausea and vomiting)     Past Surgical History:  Procedure Laterality Date   ABDOMINAL HYSTERECTOMY  1980s   BIOPSY  03/18/2019   Procedure: BIOPSY;  Surgeon: Irving Copas., MD;  Location: [place];  Service: Gastroenterology;;   BRONCHIAL BIOPSY  05/03/2020   Procedure: BRONCHIAL BIOPSIES;  Surgeon: Collene Gobble, MD;  Location: [facility] ENDOSCOPY;  Service: Pulmonary;;   BRONCHIAL BRUSHINGS  05/03/2020   Procedure: BRONCHIAL BRUSHINGS;  Surgeon: Collene Gobble, MD;  Location: [facility] ENDOSCOPY;  Service: Pulmonary;;   BRONCHIAL NEEDLE ASPIRATION BIOPSY  05/03/2020   Procedure: BRONCHIAL NEEDLE ASPIRATION BIOPSIES;  Surgeon: Collene Gobble, MD;  Location[facility] ENDOSCOPY;  Service: Pulmonary;;   CHOLECYSTECTOMY  01/06/09   COLONOSCOPY  05/09/2012   Procedure: COLONOSCOPY;  Surgeon: Inda Castle, MD;  Location: [facility] ENDOSCOPY;  Service: Endoscopy;  Laterality: N/A;   COLONOSCOPY WITH PROPOFOL N/A 03/18/2019   Procedure: COLONOSCOPY WITH PROPOFOL;  Surgeon: Rush Landmark Telford Nab., MD;  Location: [place];  Service: Gastroenterology;  Laterality: N/A;   ENTEROSCOPY N/A 03/18/2019  Procedure: ENTEROSCOPY;  Surgeon: Rush Landmark Telford Nab., MD;  Location: [place];  Service: Gastroenterology;  Laterality: N/A;   ESOPHAGOGASTRODUODENOSCOPY  05/09/2012   Procedure: ESOPHAGOGASTRODUODENOSCOPY (EGD);  Surgeon: Inda Castle, MD;  Location: [place];  Service: Endoscopy;  Laterality: N/A;   FIDUCIAL MARKER PLACEMENT  05/03/2020   Procedure: FIDUCIAL MARKER PLACEMENT;  Surgeon: Collene Gobble, MD;  Location: [facility] ENDOSCOPY;  Service: Pulmonary;;   HEMOSTASIS CLIP PLACEMENT  03/18/2019   Procedure: HEMOSTASIS CLIP PLACEMENT;  Surgeon:  Irving Copas., MD;  Location: [place];  Service: Gastroenterology;;   HOT HEMOSTASIS N/A 03/18/2019   Procedure: HOT HEMOSTASIS (ARGON PLASMA COAGULATION/BICAP);  Surgeon: Irving Copas., MD;  Location: [place];  Service: Gastroenterology;  Laterality: N/A;   POLYPECTOMY  03/18/2019   Procedure: POLYPECTOMY;  Surgeon: Rush Landmark Telford Nab., MD;  Location: [place];  Service: Gastroenterology;;   SUBMUCOSAL TATTOO INJECTION  03/18/2019   Procedure: SUBMUCOSAL TATTOO INJECTION;  Surgeon: Irving Copas., MD;  Location: [place];  Service: Gastroenterology;;   VIDEO BRONCHOSCOPY WITH ENDOBRONCHIAL NAVIGATION N/A 05/03/2020   Procedure: VIDEO BRONCHOSCOPY WITH ENDOBRONCHIAL NAVIGATION;  Surgeon: Collene Gobble, MD;  Location: [place] ENDOSCOPY;  Service: Pulmonary;  Laterality: N/A;    Family History  Problem Relation Age of Onset   Hypertension Mother    Colon cancer Neg Hx    Esophageal cancer Neg Hx    Inflammatory bowel disease Neg Hx    Liver disease Neg Hx    Pancreatic cancer Neg Hx    Rectal cancer Neg Hx    Stomach cancer Neg Hx     Social History   Socioeconomic History   Marital status: Single    Spouse name: Not on file   Number of children: 2   Years of education: 12   Highest education level: Not on file  Occupational History   Not on file  Tobacco Use   Smoking status: Former    Pack years: 0.00    Types: Cigarettes    Quit date: 08/21/1995    Years since quitting: 25.3   Smokeless tobacco: Never   Tobacco comments:    Patient states she stopped smoking in 31 yrs ago (as of 2022)  Vaping Use   Vaping Use: Never used  Substance and Sexual Activity   Alcohol use: No    Comment: in remission since 2007   Drug use: Not Currently    Comment: previously used marijuana    Sexual activity: Never  Other Topics Concern   Not on file  Social History Narrative   Right handed   Caffeine use: none   Lives alone   Social  Determinants of Health   Financial Resource Strain: Not on file  Food Insecurity: Not on file  Transportation Needs: Not on file  Physical Activity: Not on file  Stress: Not on file  Social Connections: Not on file  Intimate Partner Violence: Not on file     Physical Exam   Vitals:   12/03/20 0053 12/03/20 0104  BP: (!) 142/103 (!) 146/82  Pulse: 84 (!) 49  Resp: (!) 23 15  Temp: 97.8 F (36.6 C) (!) 97.5 F (36.4 C)  SpO2: 98% 100%    CONSTITUTIONAL: Chronically ill-appearing, NAD NEURO:  Alert and oriented x 3, no focal deficits EYES:  eyes equal and reactive ENT/NECK:  no LAD, no JVD CARDIO: Regular rate, well-perfused, normal S1 and S2 PULM:  CTAB no wheezing or rhonchi GI/GU:  normal bowel sounds, non-distended, non-tender  MSK/SPINE:  No gross deformities, no edema SKIN:  no rash, atraumatic PSYCH:  Appropriate speech and behavior  *Additional and/or pertinent findings included in MDM below  Diagnostic and Interventional Summary    EKG Interpretation  Date/Time:    Ventricular Rate:    PR Interval:    QRS Duration:   QT Interval:    QTC Calculation:   R Axis:     Text Interpretation:          Labs Reviewed  SARS CORONAVIRUS 2 (TAT 6-24 HRS)  CBC  BASIC METABOLIC PANEL  TSH    CT SOFT TISSUE NECK WO CONTRAST  Final Result      Medications - No data to display   Procedures  /  Critical Care Procedures  ED Course and Medical Decision Making  I have reviewed the triage vital signs, the nursing notes, and pertinent available records from the EMR.  Listed above are laboratory and imaging tests that I personally ordered, reviewed, and interpreted and then considered in my medical decision making (see below for details).  Given the continued pain, fullness with unintentional weight loss we will CT the soft tissues to evaluate for signs of malignancy.     Unfortunately during the CT scan the IV infiltrated and so images were obtained without  contrast.  With the noncontrast study there were no significant masses or abnormalities to suggest neoplasm.  The apices of the lungs showed some evidence of possible viral infection possibly COVID.  Will swab here.  Lung nodule discussed with patient who will follow up with PCP.  Will cover for bacterial pneumonia with azithromycin, advised to keep ENT follow-up.  Barth Kirks. [place] Small, [place] [email or web address]  Final Clinical Impressions(s) / ED Diagnoses     ICD-10-CM   1. Neck pain  M54.2       ED Discharge Orders          Ordered    azithromycin (ZITHROMAX) 250 MG tablet        12/03/20 0309             Discharge Instructions Discussed with and Provided to Patient:     Discharge Instructions      You were evaluated in the Emergency Department and after careful evaluation, we did not find any emergent condition requiring admission or further testing in the hospital.  Your exam/testing today is overall reassuring.  Recommend keeping her follow-up with ENT to further discuss your neck symptoms.  We are treating you for possible pneumonia with azithromycin antibiotic.  We also swabbed you for COVID, please follow-up on your test result using MyChart.  Please return to the Emergency Department if you experience any worsening of your condition.   Thank you for allowing [place] to be a part of your care.        Maudie Flakes, MD 12/03/20 [phone]

## 2020-12-03 ENCOUNTER — Emergency Department (HOSPITAL_COMMUNITY): Payer: Medicare Other

## 2020-12-03 LAB — SARS CORONAVIRUS 2 (TAT 6-24 HRS): SARS Coronavirus 2: NEGATIVE

## 2020-12-03 MED ORDER — AZITHROMYCIN 250 MG PO TABS: ORAL_TABLET | ORAL | 0 refills | Status: DC

## 2020-12-03 NOTE — Discharge Instructions (Addendum)
You were evaluated in the Emergency Department and after careful evaluation, we did not find any emergent condition requiring admission or further testing in the hospital.  Your exam/testing today is overall reassuring.  Recommend keeping her follow-up with ENT to further discuss your neck symptoms.  We are treating you for possible pneumonia with azithromycin antibiotic.  We also swabbed you for COVID, please follow-up on your test result using MyChart.  Please return to the Emergency Department if you experience any worsening of your condition.   Thank you for allowing [place] to be a part of your care.

## 2020-12-05 ENCOUNTER — Telehealth: Payer: Self-pay | Admitting: Neurology

## 2020-12-05 MED ORDER — LAMOTRIGINE 25 MG PO TABS: ORAL_TABLET | ORAL | 0 refills | Status: DC

## 2020-12-05 NOTE — Addendum Note (Signed)
Addended by: Darleen Crocker on: 12/05/2020 04:59 PM   Modules accepted: Orders

## 2020-12-05 NOTE — Telephone Encounter (Signed)
Pt is asking for a call from RN to know what can she put on her leg to ease the burning feeling while she waits to be seen.

## 2020-12-05 NOTE — Telephone Encounter (Signed)
Called pt. Scheduled work in appt for 12/08/20 at [place] w/ Dr. Felecia Shelling. Asked she check in by 1030am.

## 2020-12-05 NOTE — Telephone Encounter (Signed)
Pt states left leg is still burning her medication is not helping at all. Wants an appt this week, pt requesting a call back from the Nurse.

## 2020-12-05 NOTE — Telephone Encounter (Signed)
Called the pt and made her aware of the medication being added. Informed her the instructions on how to take it. Script was sent to the pharmacy on file. Pt verbalized understanding.

## 2020-12-06 NOTE — Telephone Encounter (Signed)
Called pt. Explained what she went over with CB,RN. Lamotrigine rx'd for burning sensation. Went over instructions on how to slowly work up to full dose again. She repeated back instructions twice correctly. She will keep appt w/ Dr. Felecia Shelling Thursday.

## 2020-12-06 NOTE — Telephone Encounter (Signed)
Pt called, would like a call from the nurse to discuss the reason for taking Lamotrigine.

## 2020-12-07 ENCOUNTER — Telehealth: Payer: Self-pay | Admitting: Neurology

## 2020-12-07 NOTE — Telephone Encounter (Signed)
Pt called stating she needed some kind of x-ray. Pt is requesting a call back.

## 2020-12-07 NOTE — Telephone Encounter (Signed)
Called pt back. Reminded her that she has an appointment with Dr. Felecia Shelling tomorrow and can discuss further with him then. She wants to know if sx are serious. Reminded her medication can take a few weeks to reach max benefit. She will discuss w/ MD tomorrow at appt.

## 2020-12-08 ENCOUNTER — Encounter: Payer: Self-pay | Admitting: Neurology

## 2020-12-08 ENCOUNTER — Ambulatory Visit (INDEPENDENT_AMBULATORY_CARE_PROVIDER_SITE_OTHER): Payer: Medicare Other | Admitting: Neurology

## 2020-12-08 VITALS — BP 145/76 | HR 63 | Ht 64.0 in | Wt 109.0 lb

## 2020-12-08 DIAGNOSIS — M79605 Pain in left leg: Secondary | ICD-10-CM

## 2020-12-08 DIAGNOSIS — R200 Anesthesia of skin: Secondary | ICD-10-CM | POA: Diagnosis not present

## 2020-12-08 DIAGNOSIS — R519 Headache, unspecified: Secondary | ICD-10-CM | POA: Diagnosis not present

## 2020-12-08 DIAGNOSIS — M47812 Spondylosis without myelopathy or radiculopathy, cervical region: Secondary | ICD-10-CM | POA: Diagnosis not present

## 2020-12-08 MED ORDER — LAMOTRIGINE 25 MG PO TABS
50.0000 mg | ORAL_TABLET | Freq: Two times a day (BID) | ORAL | 11 refills | Status: DC
Start: 2020-12-08 — End: 2021-01-17

## 2020-12-08 NOTE — Progress Notes (Signed)
[facility]  PATIENT: Kimberly [name] DOB: August 11, 1955  REFERRING DOCTOR OR PCP:  My Conley Rolls (Optometry); Loura Back, NP (PCP) SOURCE:   _________________________________   HISTORICAL  CHIEF COMPLAINT:  Chief Complaint  Patient presents with   Follow-up    Rm 1, alone. Here to f/u, gabapentin is ineffective per pt.     HISTORY OF PRESENT ILLNESS:  Kimberly [name] is a 65 y.o. woman with left facial and limb numbness and unbalanced gait   Update 12/08/2020: At the last visit, her gabapentin dose was increased and an MRI of the cervical spine was ordered.    She continues to experience dysesthesias and lamotrigine has been added.  She is titrating up to a dose of 50 mg twice daily and feels her painful dysesthesias are doing better in the body and face.  She also feels that her gait and balance are doing better.  The MRI of the cervical spine 11/19/2020 showed some degenerative changes.  The worst level was C3-C4 where there was a disc protrusion towards the left.  This could cause some neck pain and some left shoulder pain and numbness but would not be expected to cause numbness elsewhere.  She also had a CT scan of the neck showing some asymmetric fullness of the right oral pharyngeal mucosal but no definite masses.  She had emphysema and a small pulmonary nodule.  She is reporting that she hit her inner left leg and has more pain in her left leg.   There is no bruising.   IMAGING was personally reviewed. MRI of the brain 11/03/2020   It shows a few scattered punctate T2/FLAIR hyperintense foci consistent with age-appropriate minimal chronic microvascular ischemic changes.  There were no acute findings.  CT scan of the head 11/01/2020 was normal.  CT scan of the cervical spine 11/01/2020 showed uncovertebral spurring and disc bulging at C3-C4 likely leading to spinal stenosis.  There was mild foraminal narrowing.  MRI of the cervical spine 11/19/2020 showed degenerative  changes that were worse at C3-C4 with a disc protrusion towards the left causing foraminal narrowing but no definite nerve root compression.  REVIEW OF SYSTEMS: Constitutional: No fevers, chills, sweats, or change in appetite Eyes: No visual changes, double vision, eye pain Ear, nose and throat: No hearing loss, ear pain, nasal congestion, sore throat Cardiovascular: No chest pain, palpitations Respiratory:  No shortness of breath at rest or with exertion.   No wheezes GastrointestinaI: No nausea, vomiting, diarrhea, abdominal pain, fecal incontinence Genitourinary:  No dysuria, urinary retention or frequency.  No nocturia. Musculoskeletal:  No neck pain, back pain Integumentary: No rash, pruritus, skin lesions Neurological: as above Psychiatric: No depression at this time.  No anxiety Endocrine: No palpitations, diaphoresis, change in appetite, change in weigh or increased thirst Hematologic/Lymphatic:  No anemia, purpura, petechiae. Allergic/Immunologic: No itchy/runny eyes, nasal congestion, recent allergic reactions, rashes  ALLERGIES: Allergies  Allergen Reactions   Banana     Lip swelling   Grape (Artificial) Flavor Swelling    Allergic to grapes   Ibuprofen Rash   Penicillins Rash    Did it involve swelling of the face/tongue/throat, SOB, or low BP? No Did it involve sudden or severe rash/hives, skin peeling, or any reaction on the inside of your mouth or nose? No Did you need to seek medical attention at a hospital or doctor's office? No When did it last happen?      more than 10 years If all above answers are "  NO", may proceed with cephalosporin use.     HOME MEDICATIONS:  Current Outpatient Medications:    fluticasone (FLONASE) 50 MCG/ACT nasal spray, Use 1-2 sprays per nostril once daily as needed for sinus congestion or allergy symptoms., Disp: 16 g, Rfl: 0   fluticasone (FLOVENT HFA) 110 MCG/ACT inhaler, Inhale 2 puffs into the lungs 2 (two) times daily., Disp: 36  g, Rfl: 1   gabapentin (NEURONTIN) 300 MG capsule, Take 1 capsule (300 mg total) by mouth 3 (three) times daily., Disp: 90 capsule, Rfl: 5   lamoTRIgine (LAMICTAL) 25 MG tablet, Take 1 tablet (25 mg total) by mouth daily for 5 days, THEN 1 tablet (25 mg total) 2 (two) times daily for 5 days, THEN 1 tablet (25 mg total) in the morning, at noon, and at bedtime for 5 days, THEN 2 tablets (50 mg total) 2 (two) times daily for 5 days., Disp: 50 tablet, Rfl: 0   losartan (COZAAR) 25 MG tablet, Take 1 tablet (25 mg total) by mouth daily., Disp: 90 tablet, Rfl: 3   pantoprazole (PROTONIX) 40 MG tablet, Take 1 tablet (40 mg total) by mouth daily., Disp: 30 tablet, Rfl: 3  PAST MEDICAL HISTORY: Past Medical History:  Diagnosis Date   Alcohol abuse, in remission    since around 2005   Alcoholic hepatitis    fatty liver on imaging- Treated Hepatits C and treated   Allergic rhinitis    Anemia    EGD with duodenal AVM, normal colonoscopy 04/2012   Asthma    Callus of foot 2009   Cancer [facility])    Chest pain, musculoskeletal 2011   Cholelithiasis    [place] 09/2008: Cholelithiasis is present, s/p cholecystectomy   Depression    Excessive cerumen in ear canal    since before 2000   GERD (gastroesophageal reflux disease)    History of blood transfusion    Hyperlipidemia    Hypertension    Menopausal syndrome    Treated with estradiol in the past - discontinued 04/2012   MENOPAUSAL SYNDROME 09/24/2006   2008 Note : The pt is adament about having this medication.  She fully understands the increased risk of heart disease and cancer that is associated with HRT and is willing to accept this risk in order to prevent the debilitating hot flashes she has off this medication.  Will continue to encourage her to try to wean herself off of this medication at our next visit.  2009 note indicated that she tr   Otitis externa, acute Feb 2012   bilateral, treated with azithromycin after failure of neomycin drops   Otitis  media, acute Feb 2012   PONV (postoperative nausea and vomiting)     PAST SURGICAL HISTORY: Past Surgical History:  Procedure Laterality Date   ABDOMINAL HYSTERECTOMY  1980s   BIOPSY  03/18/2019   Procedure: BIOPSY;  Surgeon: Lemar Lofty., MD;  Location: [facility] ENDOSCOPY;  Service: Gastroenterology;;   BRONCHIAL BIOPSY  05/03/2020   Procedure: BRONCHIAL BIOPSIES;  Surgeon: Leslye Peer, MD;  Location: [facility] ENDOSCOPY;  Service: Pulmonary;;   BRONCHIAL BRUSHINGS  05/03/2020   Procedure: BRONCHIAL BRUSHINGS;  Surgeon: Leslye Peer, MD;  Location: [facility] ENDOSCOPY;  Service: Pulmonary;;   BRONCHIAL NEEDLE ASPIRATION BIOPSY  05/03/2020   Procedure: BRONCHIAL NEEDLE ASPIRATION BIOPSIES;  Surgeon: Leslye Peer, MD;  Location: [facility] ENDOSCOPY;  Service: Pulmonary;;   CHOLECYSTECTOMY  01/06/09   COLONOSCOPY  05/09/2012   Procedure: COLONOSCOPY;  Surgeon: Louis Meckel, MD;  Location: [facility] ENDOSCOPY;  Service: Endoscopy;  Laterality: N/A;   COLONOSCOPY WITH PROPOFOL N/A 03/18/2019   Procedure: COLONOSCOPY WITH PROPOFOL;  Surgeon: Meridee Score Netty Starring., MD;  Location: [facility] ENDOSCOPY;  Service: Gastroenterology;  Laterality: N/A;   ENTEROSCOPY N/A 03/18/2019   Procedure: ENTEROSCOPY;  Surgeon: Meridee Score Netty Starring., MD;  Location: [facility] ENDOSCOPY;  Service: Gastroenterology;  Laterality: N/A;   ESOPHAGOGASTRODUODENOSCOPY  05/09/2012   Procedure: ESOPHAGOGASTRODUODENOSCOPY (EGD);  Surgeon: Louis Meckel, MD;  Location: [facility] ENDOSCOPY;  Service: Endoscopy;  Laterality: N/A;   FIDUCIAL MARKER PLACEMENT  05/03/2020   Procedure: FIDUCIAL MARKER PLACEMENT;  Surgeon: Leslye Peer, MD;  Location: [facility] ENDOSCOPY;  Service: Pulmonary;;   HEMOSTASIS CLIP PLACEMENT  03/18/2019   Procedure: HEMOSTASIS CLIP PLACEMENT;  Surgeon: Lemar Lofty., MD;  Location: [facility] ENDOSCOPY;  Service: Gastroenterology;;   HOT HEMOSTASIS N/A 03/18/2019   Procedure: HOT HEMOSTASIS (ARGON PLASMA COAGULATION/BICAP);   Surgeon: Lemar Lofty., MD;  Location: [facility] ENDOSCOPY;  Service: Gastroenterology;  Laterality: N/A;   POLYPECTOMY  03/18/2019   Procedure: POLYPECTOMY;  Surgeon: Meridee Score Netty Starring., MD;  Location: [facility] ENDOSCOPY;  Service: Gastroenterology;;   SUBMUCOSAL TATTOO INJECTION  03/18/2019   Procedure: SUBMUCOSAL TATTOO INJECTION;  Surgeon: Lemar Lofty., MD;  Location: [facility] ENDOSCOPY;  Service: Gastroenterology;;   VIDEO BRONCHOSCOPY WITH ENDOBRONCHIAL NAVIGATION N/A 05/03/2020   Procedure: VIDEO BRONCHOSCOPY WITH ENDOBRONCHIAL NAVIGATION;  Surgeon: Leslye Peer, MD;  Location: [facility] ENDOSCOPY;  Service: Pulmonary;  Laterality: N/A;    FAMILY HISTORY: Family History  Problem Relation Age of Onset   Hypertension Mother    Colon cancer Neg Hx    Esophageal cancer Neg Hx    Inflammatory bowel disease Neg Hx    Liver disease Neg Hx    Pancreatic cancer Neg Hx    Rectal cancer Neg Hx    Stomach cancer Neg Hx     SOCIAL HISTORY:  Social History   Socioeconomic History   Marital status: Single    Spouse name: Not on file   Number of children: 2   Years of education: 12   Highest education level: Not on file  Occupational History   Not on file  Tobacco Use   Smoking status: Former    Types: Cigarettes    Quit date: 08/21/1995    Years since quitting: 25.3   Smokeless tobacco: Never   Tobacco comments:    Patient states she stopped smoking in 31 yrs ago (as of 2022)  Vaping Use   Vaping Use: Never used  Substance and Sexual Activity   Alcohol use: No    Comment: in remission since 2007   Drug use: Not Currently    Comment: previously used marijuana    Sexual activity: Never  Other Topics Concern   Not on file  Social History Narrative   Right handed   Caffeine use: none   Lives alone   Social Determinants of Health   Financial Resource Strain: Not on file  Food Insecurity: Not on file  Transportation Needs: Not on file  Physical Activity: Not on file   Stress: Not on file  Social Connections: Not on file  Intimate Partner Violence: Not on file     PHYSICAL EXAM  Vitals:   12/08/20 1052  BP: (!) 145/76  Pulse: 63  Weight: 109 lb (49.4 kg)  Height: 5\' 4"  (1.626 m)    Body mass index is 18.71 kg/m.   General: The patient is well-developed and well-nourished and in no acute  distress  HEENT:  Head is [place]/AT.  Sclera are anicteric.     Neck: Good range of motion.  The neck is nontender.  Skin/Extremities:  Extremities are without rash or  edema.  No bruising, open erythema, warmth at site of left proximal leg pain.  Mild muscle tenderness.  Neurologic Exam  Mental status: The patient is alert and oriented x 3 at the time of the examination. The patient has apparent normal recent and remote memory, with an apparently normal attention span and concentration ability.   Speech is normal.  Cranial nerves: Extraocular movements are full.  Facial strength and sensation was normal.  No obvious hearing deficits are noted.  Motor:  Muscle bulk is normal.   Tone is normal. Strength is  5 / 5 in all 4 extremities.   Sensory: Sensory testing is intact to soft touch and vibration sensation in all 4 extremities.  She felt mildly more sensitive to cold on the left arm  Coordination: Cerebellar testing reveals good finger-nose-finger and heel-to-shin bilaterally.  Gait and station: Station is normal.   Gait is normal. Tandem gait is mildly wide.  . Romberg is negative.   Reflexes: Deep tendon reflexes are symmetric and normal; 2 in arms ad 3 in legs - no spread.   Plantar responses are flexor.    DIAGNOSTIC DATA (LABS, IMAGING, TESTING) - I reviewed patient records, labs, notes, testing and imaging myself where available.  Lab Results  Component Value Date   WBC 5.0 11/25/2020   HGB 12.2 11/25/2020   HCT 38.8 11/25/2020   MCV 98.2 11/25/2020   PLT 285 11/25/2020      Component Value Date/Time   NA 142 11/25/2020 1219   NA 140  02/11/2020 1513   K 5.0 11/25/2020 1219   CL 104 11/25/2020 1219   CO2 30 11/25/2020 1219   GLUCOSE 94 11/25/2020 1219   BUN 6 (L) 11/25/2020 1219   BUN 9 02/11/2020 1513   CREATININE 0.79 11/25/2020 1219   CREATININE 0.75 08/09/2020 1007   CREATININE 0.76 10/15/2013 1329   CALCIUM 9.6 11/25/2020 1219   PROT 6.9 11/25/2020 1219   PROT 7.4 02/11/2020 1513   ALBUMIN 4.1 11/25/2020 1219   ALBUMIN 4.8 02/11/2020 1513   AST 23 11/25/2020 1219   AST 17 08/09/2020 1007   ALT 13 11/25/2020 1219   ALT 9 08/09/2020 1007   ALKPHOS 63 11/25/2020 1219   BILITOT 1.2 11/25/2020 1219   BILITOT 1.4 (H) 08/09/2020 1007   GFRNONAA >60 11/25/2020 1219   GFRNONAA >60 08/09/2020 1007   GFRAA >60 02/29/2020 0847   Lab Results  Component Value Date   CHOL 244 (H) 01/15/2018   HDL 50 01/15/2018   LDLCALC 172 (H) 01/15/2018   TRIG 112 01/15/2018   CHOLHDL 4.9 (H) 01/15/2018   No results found for: HGBA1C Lab Results  Component Value Date   VITAMINB12 331 01/29/2019   Lab Results  Component Value Date   TSH 1.868 11/25/2020       ASSESSMENT AND PLAN  Numbness  Facial pain  Osteoarthritis of cervical spine, unspecified spinal osteoarthritis complication status  Left leg pain   Continue  gabapentin to 300 mg po tid.    continue lamotrigine 50 mg po bid Stay active and exercise as tolerated.   F/u prn  Ozetta Flatley A. Epimenio Foot, MD, [facility] 12/08/2020, 11:32 AM Certified in Neurology, Clinical Neurophysiology, Sleep Medicine and Neuroimaging  [facility] [address] [place], [place] [address] [phone])  [phone]

## 2020-12-11 ENCOUNTER — Emergency Department (HOSPITAL_COMMUNITY)
Admission: EM | Admit: 2020-12-11 | Discharge: 2020-12-11 | Disposition: A | Discharge status: Home / Self Care | Payer: Medicare Other | Attending: Emergency Medicine | Admitting: Emergency Medicine

## 2020-12-11 ENCOUNTER — Other Ambulatory Visit: Payer: Self-pay

## 2020-12-11 ENCOUNTER — Encounter (HOSPITAL_COMMUNITY): Payer: Self-pay | Admitting: Emergency Medicine

## 2020-12-11 DIAGNOSIS — Z87891 Personal history of nicotine dependence: Secondary | ICD-10-CM | POA: Diagnosis not present

## 2020-12-11 DIAGNOSIS — J45909 Unspecified asthma, uncomplicated: Secondary | ICD-10-CM | POA: Insufficient documentation

## 2020-12-11 DIAGNOSIS — I10 Essential (primary) hypertension: Secondary | ICD-10-CM | POA: Insufficient documentation

## 2020-12-11 DIAGNOSIS — M79652 Pain in left thigh: Secondary | ICD-10-CM | POA: Diagnosis present

## 2020-12-11 DIAGNOSIS — Z7951 Long term (current) use of inhaled steroids: Secondary | ICD-10-CM | POA: Insufficient documentation

## 2020-12-11 DIAGNOSIS — Z859 Personal history of malignant neoplasm, unspecified: Secondary | ICD-10-CM | POA: Insufficient documentation

## 2020-12-11 DIAGNOSIS — Z8616 Personal history of COVID-19: Secondary | ICD-10-CM | POA: Insufficient documentation

## 2020-12-11 DIAGNOSIS — W19XXXA Unspecified fall, initial encounter: Secondary | ICD-10-CM | POA: Insufficient documentation

## 2020-12-11 DIAGNOSIS — Z79899 Other long term (current) drug therapy: Secondary | ICD-10-CM | POA: Insufficient documentation

## 2020-12-11 DIAGNOSIS — S76912A Strain of unspecified muscles, fascia and tendons at thigh level, left thigh, initial encounter: Secondary | ICD-10-CM

## 2020-12-11 DIAGNOSIS — Z85118 Personal history of other malignant neoplasm of bronchus and lung: Secondary | ICD-10-CM | POA: Diagnosis not present

## 2020-12-11 MED ORDER — CYCLOBENZAPRINE HCL 5 MG PO TABS: 5.0000 mg | ORAL_TABLET | Freq: Three times a day (TID) | ORAL | 0 refills | Status: DC | PRN

## 2020-12-11 MED ORDER — CYCLOBENZAPRINE HCL 10 MG PO TABS
5.0000 mg | ORAL_TABLET | Freq: Once | ORAL | Status: AC
  Administered 2020-12-11: 5 mg via ORAL
  Filled 2020-12-11: qty 1

## 2020-12-11 NOTE — ED Provider Notes (Signed)
[facility] Provider Note   CSN: [id number] Arrival date & time: 12/11/20  1429     History Chief Complaint  Patient presents with  . Groin Pain    Kimberly [name] is a 65 y.o. female history of hypertension, hyperlipidemia, here presenting with groin pain.  Patient states that she bumped into something several days ago.  She states that she has some pain in the left thigh.  She states that she is able to ambulate.  She did not have any other injuries.  No meds prior to arrival.  Patient states that she just wants to get checked out  The history is provided by the patient.      Past Medical History:  Diagnosis Date  . Alcohol abuse, in remission    since around 2005  . Alcoholic hepatitis    fatty liver on imaging- Treated Hepatits C and treated  . Allergic rhinitis   . Anemia    EGD with duodenal AVM, normal colonoscopy 04/2012  . Asthma   . Callus of foot 2009  . Cancer ([place])   . Chest pain, musculoskeletal 2011  . Cholelithiasis    [place] 09/2008: Cholelithiasis is present, s/p cholecystectomy  . Depression   . Excessive cerumen in ear canal    since before 2000  . GERD (gastroesophageal reflux disease)   . History of blood transfusion   . Hyperlipidemia   . Hypertension   . Menopausal syndrome    Treated with estradiol in the past - discontinued 04/2012  . MENOPAUSAL SYNDROME 09/24/2006   2008 Note : The pt is adament about having this medication.  She fully understands the increased risk of heart disease and cancer that is associated with HRT and is willing to accept this risk in order to prevent the debilitating hot flashes she has off this medication.  Will continue to encourage her to try to wean herself off of this medication at our next visit.  2009 note indicated that she tr  . Otitis externa, acute Feb 2012   bilateral, treated with azithromycin after failure of neomycin drops  . Otitis media, acute Feb 2012  . PONV  (postoperative nausea and vomiting)     Patient Active Problem List   Diagnosis Date Noted  . Numbness 11/16/2020  . Facial pain 11/16/2020  . Osteoarthritis of cervical spine 11/16/2020  . Left-sided chest wall pain 07/12/2020  . Chest wall muscle strain 07/12/2020  . Malignant neoplasm of bronchus of right upper lobe ([place]) 05/12/2020  . Mass of upper lobe of right lung 05/03/2020  . COVID-19 09/25/2019  . History of dysuria 09/24/2019  . Rhinosinusitis 08/27/2019  . Dysuria 05/25/2019  . Tachycardia 05/25/2019  . Hematuria 05/25/2019  . 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 Health care maintenance 03/25/2013  . Depression 12/03/2012  . Iron deficiency anemia 05/08/2012  . Insomnia 05/07/2012  . Weight loss 07/27/2009  . HLD (hyperlipidemia) 03/25/2008  . Constipation 11/04/2007  . Essential hypertension 09/24/2006  . Allergies 09/24/2006  . Asthma 09/24/2006  . GERD 09/24/2006    Past Surgical History:  Procedure Laterality Date  . ABDOMINAL HYSTERECTOMY  1980s  . BIOPSY  03/18/2019   Procedure: BIOPSY;  Surgeon: Rush Landmark Telford Nab., MD;  Location: [place];  Service: Gastroenterology;;  . BRONCHIAL BIOPSY  05/03/2020   Procedure: BRONCHIAL BIOPSIES;  Surgeon: Collene Gobble, MD;  Location: [facility] ENDOSCOPY;  Service: Pulmonary;;  . BRONCHIAL BRUSHINGS  05/03/2020  Procedure: BRONCHIAL BRUSHINGS;  Surgeon: Collene Gobble, MD;  Location: [facility] ENDOSCOPY;  Service: Pulmonary;;  . BRONCHIAL NEEDLE ASPIRATION BIOPSY  05/03/2020   Procedure: BRONCHIAL NEEDLE ASPIRATION BIOPSIES;  Surgeon: Collene Gobble, MD;  Location: [facility] ENDOSCOPY;  Service: Pulmonary;;  . CHOLECYSTECTOMY  01/06/09  . COLONOSCOPY  05/09/2012   Procedure: COLONOSCOPY;  Surgeon: Inda Castle, MD;  Location: [place];  Service: Endoscopy;  Laterality: N/A;  . COLONOSCOPY WITH PROPOFOL N/A 03/18/2019   Procedure: COLONOSCOPY WITH PROPOFOL;  Surgeon: Rush Landmark  Telford Nab., MD;  Location: [place];  Service: Gastroenterology;  Laterality: N/A;  . ENTEROSCOPY N/A 03/18/2019   Procedure: ENTEROSCOPY;  Surgeon: Rush Landmark Telford Nab., MD;  Location: [place];  Service: Gastroenterology;  Laterality: N/A;  . ESOPHAGOGASTRODUODENOSCOPY  05/09/2012   Procedure: ESOPHAGOGASTRODUODENOSCOPY (EGD);  Surgeon: Inda Castle, MD;  Location: [place];  Service: Endoscopy;  Laterality: N/A;  . FIDUCIAL MARKER PLACEMENT  05/03/2020   Procedure: FIDUCIAL MARKER PLACEMENT;  Surgeon: Collene Gobble, MD;  Location: [facility] ENDOSCOPY;  Service: Pulmonary;;  . HEMOSTASIS CLIP PLACEMENT  03/18/2019   Procedure: HEMOSTASIS CLIP PLACEMENT;  Surgeon: Irving Copas., MD;  Location: [place];  Service: Gastroenterology;;  . HOT HEMOSTASIS N/A 03/18/2019   Procedure: HOT HEMOSTASIS (ARGON PLASMA COAGULATION/BICAP);  Surgeon: Irving Copas., MD;  Location: [place];  Service: Gastroenterology;  Laterality: N/A;  . POLYPECTOMY  03/18/2019   Procedure: POLYPECTOMY;  Surgeon: Rush Landmark Telford Nab., MD;  Location: [place];  Service: Gastroenterology;;  . [place] INJECTION  03/18/2019   Procedure: SUBMUCOSAL TATTOO INJECTION;  Surgeon: Irving Copas., MD;  Location: [place];  Service: Gastroenterology;;  . VIDEO BRONCHOSCOPY WITH ENDOBRONCHIAL NAVIGATION N/A 05/03/2020   Procedure: VIDEO BRONCHOSCOPY WITH ENDOBRONCHIAL NAVIGATION;  Surgeon: Collene Gobble, MD;  Location: [place] ENDOSCOPY;  Service: Pulmonary;  Laterality: N/A;     OB History   No obstetric history on file.     Family History  Problem Relation Age of Onset  . Hypertension Mother   . Colon cancer Neg Hx   . Esophageal cancer Neg Hx   . Inflammatory bowel disease Neg Hx   . Liver disease Neg Hx   . Pancreatic cancer Neg Hx   . Rectal cancer Neg Hx   . Stomach cancer Neg Hx     Social History   Tobacco Use  . Smoking status: Former    Types:  Cigarettes    Quit date: 08/21/1995    Years since quitting: 25.3  . Smokeless tobacco: Never  . Tobacco comments:    Patient states she stopped smoking in 31 yrs ago (as of 2022)  Vaping Use  . Vaping Use: Never used  Substance Use Topics  . Alcohol use: No    Comment: in remission since 2007  . Drug use: Not Currently    Comment: previously used marijuana     Home Medications Prior to Admission medications   Medication Sig Start Date End Date Taking? Authorizing Provider  fluticasone (FLONASE) 50 MCG/ACT nasal spray Use 1-2 sprays per nostril once daily as needed for sinus congestion or allergy symptoms. 09/20/20   Jeralyn Bennett, MD  fluticasone (FLOVENT HFA) 110 MCG/ACT inhaler Inhale 2 puffs into the lungs 2 (two) times daily. 09/20/20   Jeralyn Bennett, MD  gabapentin (NEURONTIN) 300 MG capsule Take 1 capsule (300 mg total) by mouth 3 (three) times daily. 11/16/20   Sater, Nanine Means, MD  lamoTRIgine (LAMICTAL) 25 MG tablet Take 1 tablet (  25 mg total) by mouth daily for 5 days, THEN 1 tablet (25 mg total) 2 (two) times daily for 5 days, THEN 1 tablet (25 mg total) in the morning, at noon, and at bedtime for 5 days, THEN 2 tablets (50 mg total) 2 (two) times daily for 5 days. 12/05/20 12/25/20  Sater, Nanine Means, MD  lamoTRIgine (LAMICTAL) 25 MG tablet Take 2 tablets (50 mg total) by mouth 2 (two) times daily. 12/08/20   Sater, Nanine Means, MD  losartan (COZAAR) 25 MG tablet Take 1 tablet (25 mg total) by mouth daily. 08/10/20   Mosetta Anis, MD  pantoprazole (PROTONIX) 40 MG tablet Take 1 tablet (40 mg total) by mouth daily. 09/06/20   Jeralyn Bennett, MD    Allergies    Banana, Grape (artificial) flavor, Ibuprofen, and Penicillins  Review of Systems   Review of Systems  Musculoskeletal:        L thigh pain   All other systems reviewed and are negative.  Physical Exam Updated Vital Signs BP 136/78 (BP Location: Right Arm)   Pulse 82   Temp 98.8 F (37.1 C) (Oral)   Resp 16    LMP 08/21/1982   SpO2 97%   Physical Exam Vitals and nursing note reviewed.  Constitutional:      Appearance: Normal appearance.  HENT:     Head: Normocephalic.     Nose: Nose normal.     Mouth/Throat:     Mouth: Mucous membranes are moist.  Eyes:     Extraocular Movements: Extraocular movements intact.     Pupils: Pupils are equal, round, and reactive to light.  Cardiovascular:     Rate and Rhythm: Normal rate and regular rhythm.     Pulses: Normal pulses.     Heart sounds: Normal heart sounds.  Pulmonary:     Effort: Pulmonary effort is normal.     Breath sounds: Normal breath sounds.  Abdominal:     General: Abdomen is flat.     Palpations: Abdomen is soft.  Musculoskeletal:     Cervical back: Normal range of motion.     Comments: Mild tenderness over the left inner thigh.  Patient has normal range of motion of the hip.  Patient has no obvious deformity.  Patient is able to bear weight on the leg  Skin:    General: Skin is warm.     Capillary Refill: Capillary refill takes less than 2 seconds.  Neurological:     General: No focal deficit present.     Mental Status: She is alert and oriented to person, place, and time.  Psychiatric:        Mood and Affect: Mood normal.        Behavior: Behavior normal.    ED Results / Procedures / Treatments   Labs (all labs ordered are listed, but only abnormal results are displayed) Labs Reviewed - No data to display  EKG None  Radiology No results found.  Procedures Procedures   Medications Ordered in ED Medications - No data to display  ED Course  I have reviewed the triage vital signs and the nursing notes.  Pertinent labs & imaging results that were available during my care of the patient were reviewed by me and considered in my medical decision making (see chart for details).    MDM Rules/Calculators/A&P                         [place]  Porr is a 65 y.o. female here presenting with left leg pain after fall.   Patient has no obvious bruising or ecchymosis.  Patient is able to ambulate.  I think likely muscle strain. Recommend NSAIDS, muscle relaxants. Stable for discharge    Final Clinical Impression(s) / ED Diagnoses Final diagnoses:  None    Rx / DC Orders ED Discharge Orders     None        Drenda Freeze, MD 12/11/20 1710

## 2020-12-11 NOTE — ED Notes (Signed)
Pt states she has someone to pick her up and she is not driving

## 2020-12-11 NOTE — Discharge Instructions (Addendum)
Take some Tylenol for pain.  Take Flexeril for muscle spasms.  See your doctor for follow-up.    Return to ER if you have trouble walking, severe pain

## 2020-12-11 NOTE — ED Triage Notes (Signed)
Patient coming from home, complaint of groin pain, pt states she bumped into something.

## 2020-12-14 ENCOUNTER — Encounter (HOSPITAL_COMMUNITY): Payer: Self-pay | Admitting: Emergency Medicine

## 2020-12-14 ENCOUNTER — Emergency Department (HOSPITAL_COMMUNITY)
Admission: EM | Admit: 2020-12-14 | Discharge: 2020-12-14 | Disposition: A | Discharge status: Home / Self Care | Payer: Medicare Other | Attending: Emergency Medicine | Admitting: Emergency Medicine

## 2020-12-14 ENCOUNTER — Emergency Department (HOSPITAL_COMMUNITY): Payer: Medicare Other

## 2020-12-14 DIAGNOSIS — Z8616 Personal history of COVID-19: Secondary | ICD-10-CM | POA: Diagnosis not present

## 2020-12-14 DIAGNOSIS — Z79899 Other long term (current) drug therapy: Secondary | ICD-10-CM | POA: Diagnosis not present

## 2020-12-14 DIAGNOSIS — R911 Solitary pulmonary nodule: Secondary | ICD-10-CM

## 2020-12-14 DIAGNOSIS — Z7951 Long term (current) use of inhaled steroids: Secondary | ICD-10-CM | POA: Insufficient documentation

## 2020-12-14 DIAGNOSIS — J45909 Unspecified asthma, uncomplicated: Secondary | ICD-10-CM | POA: Insufficient documentation

## 2020-12-14 DIAGNOSIS — Z87891 Personal history of nicotine dependence: Secondary | ICD-10-CM | POA: Diagnosis not present

## 2020-12-14 DIAGNOSIS — R0789 Other chest pain: Secondary | ICD-10-CM | POA: Diagnosis not present

## 2020-12-14 DIAGNOSIS — I10 Essential (primary) hypertension: Secondary | ICD-10-CM | POA: Diagnosis not present

## 2020-12-14 DIAGNOSIS — R079 Chest pain, unspecified: Secondary | ICD-10-CM | POA: Diagnosis present

## 2020-12-14 LAB — CBC
HCT: 34.1 % — ABNORMAL LOW (ref 36.0–46.0)
Hemoglobin: 10.5 g/dL — ABNORMAL LOW (ref 12.0–15.0)
MCH: 31.2 pg (ref 26.0–34.0)
MCHC: 30.8 g/dL (ref 30.0–36.0)
MCV: 101.2 fL — ABNORMAL HIGH (ref 80.0–100.0)
Platelets: 285 10*3/uL (ref 150–400)
RBC: 3.37 MIL/uL — ABNORMAL LOW (ref 3.87–5.11)
RDW: 13.3 % (ref 11.5–15.5)
WBC: 6.3 10*3/uL (ref 4.0–10.5)
nRBC: 0 % (ref 0.0–0.2)

## 2020-12-14 LAB — BASIC METABOLIC PANEL
Anion gap: 7 (ref 5–15)
BUN: 9 mg/dL (ref 8–23)
CO2: 26 mmol/L (ref 22–32)
Calcium: 8.4 mg/dL — ABNORMAL LOW (ref 8.9–10.3)
Chloride: 105 mmol/L (ref 98–111)
Creatinine, Ser: 0.76 mg/dL (ref 0.44–1.00)
GFR, Estimated: >60 mL/min (ref >60)
Glucose, Bld: 85 mg/dL (ref 70–99)
Potassium: 3.6 mmol/L (ref 3.5–5.1)
Sodium: 138 mmol/L (ref 135–145)

## 2020-12-14 LAB — TROPONIN I (HIGH SENSITIVITY)
Troponin I (High Sensitivity): 10 ng/L (ref <18)
Troponin I (High Sensitivity): 10 ng/L (ref <18)

## 2020-12-14 MED ORDER — ACETAMINOPHEN 325 MG PO TABS
650.0000 mg | ORAL_TABLET | Freq: Once | ORAL | Status: AC
  Administered 2020-12-14: 650 mg via ORAL
  Filled 2020-12-14: qty 2

## 2020-12-14 NOTE — ED Notes (Addendum)
Pt stated that if she doesn't have a room by 2 she is leaving

## 2020-12-14 NOTE — Care Management (Signed)
ED RN Care Manager contacted [facility] to follow up to schedule appointment patient's last appointment was back in April.

## 2020-12-14 NOTE — Discharge Instructions (Addendum)
Take Tylenol for pain.  Avoid picking up any heavy items  If you have persistent pain follow-up with cardiology  You have nodules in your lungs that can be followed up with your doctor   Return to ER if you have worse chest pain, shortness of breath, trouble breathing

## 2020-12-14 NOTE — ED Triage Notes (Signed)
Patient BIB [facility] for chest tightness that started one week ago and has gotten worse since onset. Patient states she is unable to take aspirin but was given 1x SL NTG with EMS PTA with no change in pain. Patient alert, oriented, and in no apparent distress at this time.

## 2020-12-14 NOTE — ED Provider Notes (Signed)
[facility] EMERGENCY DEPARTMENT Provider Note   CSN: [id number] Arrival date & time: 12/14/20  1030     History Chief Complaint  Patient presents with   Chest Pain    ANYAH SWALLOW is a 65 y.o. female hx of asthma, alcohol hepatitis, who presented with chest pain.  Patient was seen by me several days ago with groin pain after she accidentally hit something with her leg.  She states that her leg is feeling fine now.  She states that she bought a jacket and washed it and picked it up and was very heavy and that happened several days ago.  She states that she has been having upper chest pain worse with movement.  Patient states that he has no history of heart problems or blood clots.  Patient states that she just wants to get checked out.  The history is provided by the patient.      Past Medical History:  Diagnosis Date   Alcohol abuse, in remission    since around 0973   Alcoholic hepatitis    fatty liver on imaging- Treated Hepatits C and treated   Allergic rhinitis    Anemia    EGD with duodenal AVM, normal colonoscopy 04/2012   Asthma    Callus of foot 2009   Cancer [facility])    Chest pain, musculoskeletal 2011   Cholelithiasis    [place] 09/2008: Cholelithiasis is present, s/p cholecystectomy   Depression    Excessive cerumen in ear canal    since before 2000   GERD (gastroesophageal reflux disease)    History of blood transfusion    Hyperlipidemia    Hypertension    Menopausal syndrome    Treated with estradiol in the past - discontinued 04/2012   MENOPAUSAL SYNDROME 09/24/2006   2008 Note : The pt is adament about having this medication.  She fully understands the increased risk of heart disease and cancer that is associated with HRT and is willing to accept this risk in order to prevent the debilitating hot flashes she has off this medication.  Will continue to encourage her to try to wean herself off of this medication at our next visit.  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 ([place]) 05/12/2020   Mass of upper lobe of right lung 05/03/2020   COVID-19 09/25/2019   History of dysuria 09/24/2019   Rhinosinusitis 08/27/2019   Dysuria 05/25/2019   Tachycardia 05/25/2019   Hematuria 05/25/2019   AVM (arteriovenous malformation) of small bowel, 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   Health care maintenance 03/25/2013   Depression 12/03/2012   Iron deficiency anemia 05/08/2012   Insomnia 05/07/2012   Weight loss 07/27/2009   HLD (hyperlipidemia) 03/25/2008   Constipation 11/04/2007   Essential hypertension 09/24/2006   Allergies 09/24/2006   Asthma 09/24/2006   GERD 09/24/2006    Past Surgical History:  Procedure Laterality Date   ABDOMINAL HYSTERECTOMY  1980s   BIOPSY  03/18/2019   Procedure: BIOPSY;  Surgeon: Irving Copas., MD;  Location: [facility] ENDOSCOPY;  Service: Gastroenterology;;   BRONCHIAL BIOPSY  05/03/2020   Procedure: BRONCHIAL BIOPSIES;  Surgeon: Collene Gobble, MD;  Location: [facility] ENDOSCOPY;  Service: Pulmonary;;   BRONCHIAL BRUSHINGS  05/03/2020   Procedure: BRONCHIAL BRUSHINGS;  Surgeon: Collene Gobble, MD;  Location: [facility] ENDOSCOPY;  Service: Pulmonary;;   BRONCHIAL NEEDLE ASPIRATION BIOPSY  05/03/2020   Procedure: BRONCHIAL NEEDLE ASPIRATION BIOPSIES;  Surgeon: Collene Gobble, MD;  Location: [facility] ENDOSCOPY;  Service: Pulmonary;;   CHOLECYSTECTOMY  01/06/09   COLONOSCOPY  05/09/2012   Procedure: COLONOSCOPY;  Surgeon: Inda Castle, MD;  Location: [place];  Service:  Endoscopy;  Laterality: N/A;   COLONOSCOPY WITH PROPOFOL N/A 03/18/2019   Procedure: COLONOSCOPY WITH PROPOFOL;  Surgeon: Rush Landmark Telford Nab., MD;  Location: [place];  Service: Gastroenterology;  Laterality: N/A;   ENTEROSCOPY N/A 03/18/2019   Procedure: ENTEROSCOPY;  Surgeon: Rush Landmark Telford Nab., MD;  Location: [place];  Service: Gastroenterology;  Laterality: N/A;   ESOPHAGOGASTRODUODENOSCOPY  05/09/2012   Procedure: ESOPHAGOGASTRODUODENOSCOPY (EGD);  Surgeon: Inda Castle, MD;  Location: [place];  Service: Endoscopy;  Laterality: N/A;   FIDUCIAL MARKER PLACEMENT  05/03/2020   Procedure: FIDUCIAL MARKER PLACEMENT;  Surgeon: Collene Gobble, MD;  Location: [facility] ENDOSCOPY;  Service: Pulmonary;;   HEMOSTASIS CLIP PLACEMENT  03/18/2019   Procedure: HEMOSTASIS CLIP PLACEMENT;  Surgeon: Irving Copas., MD;  Location: [place];  Service: Gastroenterology;;   HOT HEMOSTASIS N/A 03/18/2019   Procedure: HOT HEMOSTASIS (ARGON PLASMA COAGULATION/BICAP);  Surgeon: Irving Copas., MD;  Location: [place];  Service: Gastroenterology;  Laterality: N/A;   POLYPECTOMY  03/18/2019   Procedure: POLYPECTOMY;  Surgeon: Rush Landmark Telford Nab., MD;  Location: [place];  Service: Gastroenterology;;   SUBMUCOSAL TATTOO INJECTION  03/18/2019   Procedure: SUBMUCOSAL TATTOO INJECTION;  Surgeon: Irving Copas., MD;  Location: [place];  Service: Gastroenterology;;   VIDEO BRONCHOSCOPY WITH ENDOBRONCHIAL NAVIGATION N/A 05/03/2020   Procedure: VIDEO BRONCHOSCOPY WITH ENDOBRONCHIAL NAVIGATION;  Surgeon: Collene Gobble, MD;  Location: [place] ENDOSCOPY;  Service: Pulmonary;  Laterality: N/A;     OB History   No obstetric history on file.     Family History  Problem Relation Age of Onset   Hypertension Mother    Colon cancer Neg Hx    Esophageal cancer Neg Hx    Inflammatory bowel disease Neg Hx    Liver disease Neg Hx    Pancreatic cancer Neg Hx     Rectal cancer Neg Hx    Stomach cancer Neg Hx     Social History   Tobacco Use   Smoking status: Former    Types: Cigarettes    Quit date: 08/21/1995    Years since quitting: 25.3   Smokeless tobacco: Never   Tobacco comments:    Patient states she stopped smoking in 31 yrs ago (as of 2022)  Vaping Use   Vaping Use: Never used  Substance Use Topics   Alcohol use: No    Comment: in remission since 2007   Drug use: Not Currently    Comment: previously used marijuana     Home Medications Prior to Admission medications   Medication Sig Start Date End Date Taking? Authorizing Provider  cyclobenzaprine (FLEXERIL) 5 MG tablet Take 1 tablet (5 mg total) by mouth 3 (three) times daily as needed. 12/11/20   Drenda Freeze, MD  fluticasone [facility]) 50 MCG/ACT nasal spray Use 1-2 sprays per nostril once daily as needed for sinus congestion or  allergy symptoms. 09/20/20   Jeralyn Bennett, MD  fluticasone (FLOVENT HFA) 110 MCG/ACT inhaler Inhale 2 puffs into the lungs 2 (two) times daily. 09/20/20   Jeralyn Bennett, MD  gabapentin (NEURONTIN) 300 MG capsule Take 1 capsule (300 mg total) by mouth 3 (three) times daily. 11/16/20   Sater, Nanine Means, MD  lamoTRIgine (LAMICTAL) 25 MG tablet Take 1 tablet (25 mg total) by mouth daily for 5 days, THEN 1 tablet (25 mg total) 2 (two) times daily for 5 days, THEN 1 tablet (25 mg total) in the morning, at noon, and at bedtime for 5 days, THEN 2 tablets (50 mg total) 2 (two) times daily for 5 days. 12/05/20 12/25/20  Sater, Nanine Means, MD  lamoTRIgine (LAMICTAL) 25 MG tablet Take 2 tablets (50 mg total) by mouth 2 (two) times daily. 12/08/20   Sater, Nanine Means, MD  losartan (COZAAR) 25 MG tablet Take 1 tablet (25 mg total) by mouth daily. 08/10/20   Mosetta Anis, MD  pantoprazole (PROTONIX) 40 MG tablet Take 1 tablet (40 mg total) by mouth daily. 09/06/20   Jeralyn Bennett, MD    Allergies    Banana, Grape (artificial) flavor, Ibuprofen, and  Penicillins  Review of Systems   Review of Systems  Cardiovascular:  Positive for chest pain.  All other systems reviewed and are negative.  Physical Exam Updated Vital Signs BP 136/71 (BP Location: Left Arm)   Pulse 66   Temp 98.3 F (36.8 C) (Oral)   Resp 16   LMP 08/21/1982   SpO2 99%   Physical Exam Vitals and nursing note reviewed.  Constitutional:      Appearance: She is well-developed.  HENT:     Head: Normocephalic and atraumatic.  Eyes:     Extraocular Movements: Extraocular movements intact.     Pupils: Pupils are equal, round, and reactive to light.  Cardiovascular:     Rate and Rhythm: Normal rate and regular rhythm.     Heart sounds: Normal heart sounds.  Chest:     Comments: Mild reproducible chest wall tenderness  Abdominal:     General: Bowel sounds are normal.     Palpations: Abdomen is soft.  Musculoskeletal:        General: Normal range of motion.     Cervical back: Normal range of motion and neck supple.  Skin:    General: Skin is warm.     Capillary Refill: Capillary refill takes less than 2 seconds.  Neurological:     Mental Status: She is alert.  Psychiatric:        Mood and Affect: Mood normal.        Behavior: Behavior normal.    ED Results / Procedures / Treatments   Labs (all labs ordered are listed, but only abnormal results are displayed) Labs Reviewed  BASIC METABOLIC PANEL - Abnormal; Notable for the following components:      Result Value   Calcium 8.4 (*)    All other components within normal limits  CBC - Abnormal; Notable for the following components:   RBC 3.37 (*)    Hemoglobin 10.5 (*)    HCT 34.1 (*)    MCV 101.2 (*)    All other components within normal limits  TROPONIN I (HIGH SENSITIVITY)  TROPONIN I (HIGH SENSITIVITY)    EKG EKG Interpretation  Date/Time:  Wednesday December 14 2020 10:44:04 EDT Ventricular Rate:  85 PR Interval:  118 QRS Duration: 84 QT Interval:  378 QTC Calculation: 449  R  Axis:   51 Text Interpretation: Sinus rhythm with Premature atrial complexes Nonspecific ST abnormality Confirmed by Lajean Saver [phone]) on 12/14/2020 3:54:35 PM  Radiology DG Chest 2 View  Result Date: 12/14/2020 CLINICAL DATA:  Chest pain EXAM: CHEST - 2 VIEW COMPARISON:  05/03/2020 FINDINGS: Lungs are hyperexpanded without focal consolidation or pulmonary edema. Scarring in the left lung is noted with adjacent fiducial markers, presumably related to prior radiation therapy. Bandlike scarring noted parahilar right midlung in the region of the spiculated nodule seen on CT chest 08/09/2020. Irregular nodular opacity identified in both lung apices, better demonstrated on prior chest CT. The visualized bony structures of the thorax show no acute abnormality. IMPRESSION: 1. Hyperexpansion with no findings to suggest pneumonia, edema or pleural effusion. 2. Bilateral pulmonary nodules, better demonstrated on prior chest CT from 08/09/2020. Electronically Signed   By: Misty Stanley M.D.   On: 12/14/2020 11:16    Procedures Procedures   Medications Ordered in ED Medications - No data to display  ED Course  I have reviewed the triage vital signs and the nursing notes.  Pertinent labs & imaging results that were available during my care of the patient were reviewed by me and considered in my medical decision making (see chart for details).    MDM Rules/Calculators/A&P                          SHATEKA PETREA is a 65 y.o. female here with chest pain.  Patient is chest pain after lifting up a heavy item several days ago.  Patient is hemodynamically treatable.  Her troponin is negative x2.  Chest x-ray is normal.  I think likely has chest wall pain from lifting heavy items.  Patient has ibuprofen allergy.  Told her to take Tylenol.  Stable for discharge     Final Clinical Impression(s) / ED Diagnoses Final diagnoses:  None    Rx / DC Orders ED Discharge Orders     None        Drenda Freeze, MD 12/14/20 1609

## 2020-12-19 ENCOUNTER — Other Ambulatory Visit: Payer: Self-pay | Admitting: Otolaryngology

## 2020-12-19 ENCOUNTER — Telehealth: Payer: Self-pay | Admitting: Neurology

## 2020-12-19 DIAGNOSIS — R1310 Dysphagia, unspecified: Secondary | ICD-10-CM

## 2020-12-19 NOTE — Telephone Encounter (Signed)
Pt called wanting to discuss the pain in her arms and legs that is still going on. Please advise.

## 2020-12-19 NOTE — Telephone Encounter (Signed)
Called pt back. She wanted to know how long sx will go on for. Advised she may have this chronically. She should continue lamotrigine and gabapentin as prescribed. She is now taking lamotrigine 25mg , 2 tabs po BID. Made her aware Dr. Felecia Shelling called in a new prescription for this dosing, should be at pharmacy for her if she has not picked this up. Call then ended on her end. I tried calling back. Went to VM. LVM letting her know to call if she has any more questions/concerns.

## 2020-12-19 NOTE — Telephone Encounter (Signed)
Took call from phone staff. She will continue lamotrigine and gabapentin as prescribed. If sx have not improved in 3-4 weeks she will call back.

## 2020-12-23 ENCOUNTER — Other Ambulatory Visit: Payer: Self-pay | Admitting: Internal Medicine

## 2020-12-23 DIAGNOSIS — Z13820 Encounter for screening for osteoporosis: Secondary | ICD-10-CM

## 2020-12-25 ENCOUNTER — Other Ambulatory Visit: Payer: Self-pay

## 2020-12-25 ENCOUNTER — Ambulatory Visit (HOSPITAL_COMMUNITY)
Admission: EM | Admit: 2020-12-25 | Discharge: 2020-12-25 | Disposition: A | Discharge status: Home / Self Care | Payer: Medicare Other | Attending: Student | Admitting: Student

## 2020-12-25 ENCOUNTER — Encounter (HOSPITAL_COMMUNITY): Payer: Self-pay

## 2020-12-25 DIAGNOSIS — H5712 Ocular pain, left eye: Secondary | ICD-10-CM | POA: Diagnosis not present

## 2020-12-25 DIAGNOSIS — Z9849 Cataract extraction status, unspecified eye: Secondary | ICD-10-CM | POA: Diagnosis not present

## 2020-12-25 NOTE — ED Provider Notes (Signed)
[facility]    CSN: [id number] Arrival date & time: 12/25/20  1001      History   Chief Complaint Chief Complaint  Patient presents with   Facial Swelling    HPI Kimberly [name] is a 65 y.o. female presenting with L eye issue x2 months. Medical history bilateral cataract surgery 2 months ago.  States that she has had mild pain and swelling under the left eye for 2 months since then.  She has already been followed by her ophthalmologist for this, denies absolutely any new symptoms.  Has been prescribed drops by her ophthalmologist, which she is using with minimal relief.  Wears glasses not contacts.  Denies photophobia, foreign body sensation, eye redness, eye crusting in the morning, eye pain, eye pain with movement, injury to eye, vision changes, double vision, excessive tearing, burning eyes   HPI  Past Medical History:  Diagnosis Date   Alcohol abuse, in remission    since around 5784   Alcoholic hepatitis    fatty liver on imaging- Treated Hepatits C and treated   Allergic rhinitis    Anemia    EGD with duodenal AVM, normal colonoscopy 04/2012   Asthma    Callus of foot 2009   Cancer [facility])    Chest pain, musculoskeletal 2011   Cholelithiasis    [place] 09/2008: Cholelithiasis is present, s/p cholecystectomy   Depression    Excessive cerumen in ear canal    since before 2000   GERD (gastroesophageal reflux disease)    History of blood transfusion    Hyperlipidemia    Hypertension    Menopausal syndrome    Treated with estradiol in the past - discontinued 04/2012   MENOPAUSAL SYNDROME 09/24/2006   2008 Note : The pt is adament about having this medication.  She fully understands the increased risk of heart disease and cancer that is associated with HRT and is willing to accept this risk in order to prevent the debilitating hot flashes she has off this medication.  Will continue to encourage her to try to wean herself off of this medication at our next visit.  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 ([place]) 05/12/2020   Mass of upper lobe of right lung 05/03/2020   COVID-19 09/25/2019   History of dysuria 09/24/2019   Rhinosinusitis 08/27/2019   Dysuria 05/25/2019   Tachycardia 05/25/2019   Hematuria 05/25/2019   AVM (arteriovenous malformation) of small bowel, 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   Health care maintenance 03/25/2013   Depression 12/03/2012   Iron deficiency anemia 05/08/2012   Insomnia 05/07/2012   Weight loss 07/27/2009   HLD (hyperlipidemia) 03/25/2008   Constipation 11/04/2007   Essential hypertension 09/24/2006   Allergies 09/24/2006   Asthma 09/24/2006   GERD 09/24/2006    Past Surgical History:  Procedure Laterality Date   ABDOMINAL HYSTERECTOMY  1980s   BIOPSY  03/18/2019   Procedure: BIOPSY;  Surgeon: Irving Copas., MD;  Location: [facility] ENDOSCOPY;  Service: Gastroenterology;;   BRONCHIAL BIOPSY  05/03/2020   Procedure: BRONCHIAL BIOPSIES;  Surgeon: Collene Gobble, MD;  Location: [facility] ENDOSCOPY;  Service: Pulmonary;;   BRONCHIAL BRUSHINGS  05/03/2020   Procedure: BRONCHIAL BRUSHINGS;  Surgeon: Collene Gobble, MD;  Location: [facility] ENDOSCOPY;  Service: Pulmonary;;   BRONCHIAL NEEDLE ASPIRATION BIOPSY  05/03/2020   Procedure: BRONCHIAL NEEDLE ASPIRATION BIOPSIES;  Surgeon: Collene Gobble, MD;  Location: [facility] ENDOSCOPY;  Service: Pulmonary;;   CHOLECYSTECTOMY  01/06/09   COLONOSCOPY  05/09/2012   Procedure: COLONOSCOPY;  Surgeon: Inda Castle, MD;  Location: [place];  Service:  Endoscopy;  Laterality: N/A;   COLONOSCOPY WITH PROPOFOL N/A 03/18/2019   Procedure: COLONOSCOPY WITH PROPOFOL;  Surgeon: Rush Landmark Telford Nab., MD;  Location: [place];  Service: Gastroenterology;  Laterality: N/A;   ENTEROSCOPY N/A 03/18/2019   Procedure: ENTEROSCOPY;  Surgeon: Rush Landmark Telford Nab., MD;  Location: [place];  Service: Gastroenterology;  Laterality: N/A;   ESOPHAGOGASTRODUODENOSCOPY  05/09/2012   Procedure: ESOPHAGOGASTRODUODENOSCOPY (EGD);  Surgeon: Inda Castle, MD;  Location: [place];  Service: Endoscopy;  Laterality: N/A;   FIDUCIAL MARKER PLACEMENT  05/03/2020   Procedure: FIDUCIAL MARKER PLACEMENT;  Surgeon: Collene Gobble, MD;  Location: [facility] ENDOSCOPY;  Service: Pulmonary;;   HEMOSTASIS CLIP PLACEMENT  03/18/2019   Procedure: HEMOSTASIS CLIP PLACEMENT;  Surgeon: Irving Copas., MD;  Location: [place];  Service: Gastroenterology;;   HOT HEMOSTASIS N/A 03/18/2019   Procedure: HOT HEMOSTASIS (ARGON PLASMA COAGULATION/BICAP);  Surgeon: Irving Copas., MD;  Location: [place];  Service: Gastroenterology;  Laterality: N/A;   POLYPECTOMY  03/18/2019   Procedure: POLYPECTOMY;  Surgeon: Rush Landmark Telford Nab., MD;  Location: [place];  Service: Gastroenterology;;   SUBMUCOSAL TATTOO INJECTION  03/18/2019   Procedure: SUBMUCOSAL TATTOO INJECTION;  Surgeon: Irving Copas., MD;  Location: [place];  Service: Gastroenterology;;   VIDEO BRONCHOSCOPY WITH ENDOBRONCHIAL NAVIGATION N/A 05/03/2020   Procedure: VIDEO BRONCHOSCOPY WITH ENDOBRONCHIAL NAVIGATION;  Surgeon: Collene Gobble, MD;  Location: [place] ENDOSCOPY;  Service: Pulmonary;  Laterality: N/A;    OB History   No obstetric history on file.      Home Medications    Prior to Admission medications   Medication Sig Start Date End Date Taking? Authorizing Provider  cyclobenzaprine (FLEXERIL) 5 MG tablet Take 1 tablet (5 mg total) by mouth 3 (three) times daily as  needed. 12/11/20   Drenda Freeze, MD  fluticasone [facility]) 50 MCG/ACT nasal spray Use 1-2 sprays per nostril once daily as needed for sinus congestion or allergy symptoms. 09/20/20   Jeralyn Bennett, MD  fluticasone (FLOVENT HFA) 110 MCG/ACT inhaler Inhale 2 puffs into the lungs 2 (two) times daily. 09/20/20   Jeralyn Bennett, MD  gabapentin (NEURONTIN) 300 MG capsule Take 1 capsule (300 mg total) by mouth 3 (three) times daily. 11/16/20   Sater, Nanine Means, MD  lamoTRIgine (LAMICTAL) 25 MG tablet Take 1 tablet (25 mg total) by mouth daily for 5 days, THEN 1 tablet (25 mg total) 2 (two) times daily for 5 days, THEN 1 tablet (25 mg total) in the morning, at noon, and at bedtime for 5 days, THEN 2 tablets (50 mg total) 2 (two) times daily for 5 days. 12/05/20 12/25/20  Sater, Nanine Means, MD  lamoTRIgine (LAMICTAL) 25 MG tablet Take 2 tablets (50 mg total) by mouth 2 (two) times daily. 12/08/20   Sater, Nanine Means, MD  losartan (COZAAR) 25 MG tablet Take 1 tablet (25 mg total) by mouth daily. 08/10/20   Mosetta Anis,  MD  pantoprazole (PROTONIX) 40 MG tablet Take 1 tablet (40 mg total) by mouth daily. 09/06/20   Jeralyn Bennett, MD    Family History Family History  Problem Relation Age of Onset   Hypertension Mother    Colon cancer Neg Hx    Esophageal cancer Neg Hx    Inflammatory bowel disease Neg Hx    Liver disease Neg Hx    Pancreatic cancer Neg Hx    Rectal cancer Neg Hx    Stomach cancer Neg Hx     Social History Social History   Tobacco Use   Smoking status: Former    Types: Cigarettes    Quit date: 08/21/1995    Years since quitting: 25.3   Smokeless tobacco: Never   Tobacco comments:    Patient states she stopped smoking in 31 yrs ago (as of 2022)  Vaping Use   Vaping Use: Never used  Substance Use Topics   Alcohol use: No    Comment: in remission since 2007   Drug use: Not Currently    Comment: previously used marijuana      Allergies   Banana, Grape (artificial)  flavor, Grape concord os [flavoring agent], Ibuprofen, and Penicillins   Review of Systems Review of Systems  Eyes:  Positive for pain. Negative for photophobia, discharge, redness, itching and visual disturbance.  All other systems reviewed and are negative.   Physical Exam Triage Vital Signs ED Triage Vitals  Enc Vitals Group     BP 12/25/20 1026 (!) 141/73     Pulse Rate 12/25/20 1026 73     Resp 12/25/20 1026 16     Temp 12/25/20 1026 98.7 F (37.1 C)     Temp Source 12/25/20 1026 Oral     SpO2 12/25/20 1026 99 %     Weight --      Height --      Head Circumference --      Peak Flow --      Pain Score 12/25/20 1024 10     Pain Loc --      Pain Edu? --      Excl. in [place]? --    No data found.  Updated Vital Signs BP (!) 141/73 (BP Location: Right Arm)   Pulse 73   Temp 98.7 F (37.1 C) (Oral)   Resp 16   LMP 08/21/1982   SpO2 99%   Visual Acuity Right Eye Distance:   Left Eye Distance:   Bilateral Distance:    Right Eye Near:   Left Eye Near:    Bilateral Near:     Physical Exam Vitals reviewed.  Constitutional:      Appearance: Normal appearance.  HENT:     Head: Normocephalic and atraumatic.     Right Ear: Tympanic membrane, ear canal and external ear normal. There is no impacted cerumen.     Left Ear: Tympanic membrane, ear canal and external ear normal. There is no impacted cerumen.     Nose: Nose normal. No congestion.     Mouth/Throat:     Pharynx: Oropharynx is clear. No posterior oropharyngeal erythema.  Eyes:     General: Lids are normal. Lids are everted, no foreign bodies appreciated. Vision grossly intact. Gaze aligned appropriately. No visual field deficit.       Right eye: No foreign body, discharge or hordeolum.        Left eye: No foreign body, discharge or hordeolum.     Extraocular Movements: Extraocular movements  intact.     Right eye: Normal extraocular motion and no nystagmus.     Left eye: Normal extraocular motion and no  nystagmus.     Conjunctiva/sclera: Conjunctivae normal.     Right eye: Right conjunctiva is not injected. No chemosis, exudate or hemorrhage.    Left eye: Left conjunctiva is not injected. No chemosis, exudate or hemorrhage.    Pupils: Pupils are equal, round, and reactive to light.     Visual Fields: Right eye visual fields normal and left eye visual fields normal.     Comments: VERY minimal effusion below L eye, without tenderness. PERRLA, EOMI without pain. No orbital tenderness. Visual acuity intact. No conjunctival injection.  Cardiovascular:     Rate and Rhythm: Normal rate and regular rhythm.     Heart sounds: Normal heart sounds.  Pulmonary:     Effort: Pulmonary effort is normal.     Breath sounds: Normal breath sounds.  Neurological:     General: No focal deficit present.     Mental Status: She is alert.  Psychiatric:        Mood and Affect: Mood normal.        Behavior: Behavior normal.        Thought Content: Thought content normal.        Judgment: Judgment normal.     UC Treatments / Results  Labs (all labs ordered are listed, but only abnormal results are displayed) Labs Reviewed - No data to display  EKG   Radiology No results found.  Procedures Procedures (including critical care time)  Medications Ordered in UC Medications - No data to display  Initial Impression / Assessment and Plan / UC Course  I have reviewed the triage vital signs and the nursing notes.  Pertinent labs & imaging results that were available during my care of the patient were reviewed by me and considered in my medical decision making (see chart for details).     This patient is a 65 year old female presenting with mild effusion below her left eye for 2 months following cataract surgery that occurred 2 months ago.  Has been followed by her ophthalmologist for this already, they prescribed some drops but patient states is not helping.  States no new symptoms, visual acuity is intact  today and there are no signs of infection.  Advised her to follow-up with her ophthalmologist next business day. ED return precautions discussed. Patient verbalizes understanding and agreement.   Final Clinical Impressions(s) / UC Diagnoses   Final diagnoses:  History of cataract extraction, unspecified laterality     Discharge Instructions      -Please follow-up with your eye doctor. You can call them tomorrow to set this up. Continue the drops they prescribed for now.      ED Prescriptions   None    PDMP not reviewed this encounter.   Hazel Sams, PA-C 12/25/20 1045

## 2020-12-25 NOTE — ED Triage Notes (Signed)
Pt reports swelling and pain under left eye x 1 week.

## 2020-12-25 NOTE — ED Notes (Signed)
Notified laura, pa of visual acuity in medical record and verified patient could leave at this time.  Instructed patient is ready for discharge

## 2020-12-25 NOTE — Discharge Instructions (Addendum)
-  Please follow-up with your eye doctor. You can call them tomorrow to set this up. Continue the drops they prescribed for now.

## 2020-12-26 NOTE — Telephone Encounter (Signed)
Pt has called back in response to the message that was just sent asking to speak with RN.  Pt advised of response time allowed for RN to respond to messages.  Pt asked that I note she will catch the buss to the hospital to see why she feels the way that she does, she would still like a call back from RN on her cell.

## 2020-12-26 NOTE — Telephone Encounter (Signed)
Pt called wanting to speak to the RN stating that the medication is not working for her and she is still having the sx. Please advise.

## 2020-12-26 NOTE — Telephone Encounter (Signed)
Called the patient back. She is having discomfort where she had her eye surgery and she states the pains are still shooting down her arm.  Advised that since we just spoke with her last week and reviewed this she needs to continue to give the medication time to kick in and start working before adjustments are made. Advised in regards to her eye she needs to follow up with the eye MD. Pt was appreciative for the call back.

## 2020-12-28 ENCOUNTER — Ambulatory Visit
Admission: RE | Admit: 2020-12-28 | Discharge: 2020-12-28 | Disposition: A | Discharge status: Home / Self Care | Payer: Medicare Other | Source: Ambulatory Visit | Attending: Internal Medicine | Admitting: Internal Medicine

## 2020-12-28 ENCOUNTER — Other Ambulatory Visit: Payer: Self-pay

## 2020-12-29 ENCOUNTER — Telehealth: Payer: Self-pay | Admitting: *Deleted

## 2020-12-29 NOTE — Telephone Encounter (Signed)
-----   Message from Sid Falcon, MD sent at 12/28/2020 10:06 AM EDT ----- Can you please print this and send to PCP listed?  I can't find that they have an epic account.  ----- Message ----- From: Interface, Rad Results In Sent: 12/28/2020   9:42 AM EDT To: Sid Falcon, MD

## 2020-12-29 NOTE — Telephone Encounter (Signed)
Bone Density report faxed to Arthur Holms, NP at [facility] @[phone] per Dr Daryll Drown.

## 2020-12-30 ENCOUNTER — Ambulatory Visit
Admission: RE | Admit: 2020-12-30 | Discharge: 2020-12-30 | Disposition: A | Discharge status: Home / Self Care | Payer: Medicare Other | Source: Ambulatory Visit | Attending: Otolaryngology | Admitting: Otolaryngology

## 2020-12-30 ENCOUNTER — Other Ambulatory Visit: Payer: Self-pay

## 2020-12-30 DIAGNOSIS — R1310 Dysphagia, unspecified: Secondary | ICD-10-CM

## 2021-01-01 ENCOUNTER — Encounter (HOSPITAL_COMMUNITY): Payer: Self-pay

## 2021-01-01 ENCOUNTER — Other Ambulatory Visit: Payer: Self-pay

## 2021-01-01 ENCOUNTER — Ambulatory Visit (HOSPITAL_COMMUNITY)
Admission: EM | Admit: 2021-01-01 | Discharge: 2021-01-01 | Disposition: A | Discharge status: Home / Self Care | Payer: Medicare Other | Attending: Medical Oncology | Admitting: Medical Oncology

## 2021-01-01 DIAGNOSIS — J029 Acute pharyngitis, unspecified: Secondary | ICD-10-CM | POA: Diagnosis not present

## 2021-01-01 MED ORDER — ESOMEPRAZOLE MAGNESIUM 40 MG PO CPDR: 40.0000 mg | DELAYED_RELEASE_CAPSULE | Freq: Every day | ORAL | 0 refills | Status: DC

## 2021-01-01 MED ORDER — NYSTATIN 100000 UNIT/ML MT SUSP: 5.0000 mL | Freq: Three times a day (TID) | OROMUCOSAL | 0 refills | Status: AC | PRN

## 2021-01-01 NOTE — ED Triage Notes (Signed)
Pt c/o pain in thyroid area. States she has an appt with her provider this coming Thursday.

## 2021-01-01 NOTE — ED Provider Notes (Signed)
[place]    CSN: [id number] Arrival date & time: 01/01/21  1327      History   Chief Complaint Chief Complaint  Patient presents with   Sore Throat    HPI Kimberly [name] is a 65 y.o. female.   HPI  Sore Throat: Patient reports that she has had pain in her "thyroid" area for the past few weeks.  She has no history of thyroid nodules, goiter, thyroid abnormality.  She denies trouble breathing swallowing, choking, voice changes, fevers. She does have pain with swallowing. She has tried nothing yet other than her current medication for symptoms. Of note she has an appointment with a specialist on Thursday for symptoms but patient requested anything be tried here in office today.  Past Medical History:  Diagnosis Date   Alcohol abuse, in remission    since around 8127   Alcoholic hepatitis    fatty liver on imaging- Treated Hepatits C and treated   Allergic rhinitis    Anemia    EGD with duodenal AVM, normal colonoscopy 04/2012   Asthma    Callus of foot 2009   Cancer [facility])    Chest pain, musculoskeletal 2011   Cholelithiasis    [place] 09/2008: Cholelithiasis is present, s/p cholecystectomy   Depression    Excessive cerumen in ear canal    since before 2000   GERD (gastroesophageal reflux disease)    History of blood transfusion    Hyperlipidemia    Hypertension    Menopausal syndrome    Treated with estradiol in the past - discontinued 04/2012   MENOPAUSAL SYNDROME 09/24/2006   2008 Note : The pt is adament about having this medication.  She fully understands the increased risk of heart disease and cancer that is associated with HRT and is willing to accept this risk in order to prevent the debilitating hot flashes she has off this medication.  Will continue to encourage her to try to wean herself off of this medication at our next visit.  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 ([place]) 05/12/2020   Mass of upper lobe of right lung 05/03/2020   COVID-19 09/25/2019   History of dysuria 09/24/2019   Rhinosinusitis 08/27/2019   Dysuria 05/25/2019   Tachycardia 05/25/2019   Hematuria 05/25/2019   AVM (arteriovenous malformation) of small bowel, 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   Health care maintenance 03/25/2013   Depression 12/03/2012   Iron deficiency anemia 05/08/2012   Insomnia 05/07/2012   Weight loss 07/27/2009   HLD (hyperlipidemia) 03/25/2008   Constipation 11/04/2007   Essential hypertension 09/24/2006   Allergies 09/24/2006   Asthma 09/24/2006   GERD 09/24/2006    Past Surgical History:  Procedure Laterality Date   ABDOMINAL HYSTERECTOMY  1980s   BIOPSY  03/18/2019   Procedure: BIOPSY;  Surgeon: Irving Copas., MD;  Location: [place];  Service: Gastroenterology;;   BRONCHIAL BIOPSY  05/03/2020   Procedure: BRONCHIAL BIOPSIES;  Surgeon: Collene Gobble, MD;  Location: [place];  Service:  Pulmonary;;   BRONCHIAL BRUSHINGS  05/03/2020   Procedure: BRONCHIAL BRUSHINGS;  Surgeon: Collene Gobble, MD;  Location: [facility] ENDOSCOPY;  Service: Pulmonary;;   BRONCHIAL NEEDLE ASPIRATION BIOPSY  05/03/2020   Procedure: BRONCHIAL NEEDLE ASPIRATION BIOPSIES;  Surgeon: Collene Gobble, MD;  Location: [facility] ENDOSCOPY;  Service: Pulmonary;;   CHOLECYSTECTOMY  01/06/09   COLONOSCOPY  05/09/2012   Procedure: COLONOSCOPY;  Surgeon: Inda Castle, MD;  Location: [place];  Service: Endoscopy;  Laterality: N/A;   COLONOSCOPY WITH PROPOFOL N/A 03/18/2019   Procedure: COLONOSCOPY WITH PROPOFOL;   Surgeon: Rush Landmark Telford Nab., MD;  Location: [place];  Service: Gastroenterology;  Laterality: N/A;   ENTEROSCOPY N/A 03/18/2019   Procedure: ENTEROSCOPY;  Surgeon: Rush Landmark Telford Nab., MD;  Location: [place];  Service: Gastroenterology;  Laterality: N/A;   ESOPHAGOGASTRODUODENOSCOPY  05/09/2012   Procedure: ESOPHAGOGASTRODUODENOSCOPY (EGD);  Surgeon: Inda Castle, MD;  Location: [place];  Service: Endoscopy;  Laterality: N/A;   FIDUCIAL MARKER PLACEMENT  05/03/2020   Procedure: FIDUCIAL MARKER PLACEMENT;  Surgeon: Collene Gobble, MD;  Location: [facility] ENDOSCOPY;  Service: Pulmonary;;   HEMOSTASIS CLIP PLACEMENT  03/18/2019   Procedure: HEMOSTASIS CLIP PLACEMENT;  Surgeon: Irving Copas., MD;  Location: [place];  Service: Gastroenterology;;   HOT HEMOSTASIS N/A 03/18/2019   Procedure: HOT HEMOSTASIS (ARGON PLASMA COAGULATION/BICAP);  Surgeon: Irving Copas., MD;  Location: [place];  Service: Gastroenterology;  Laterality: N/A;   POLYPECTOMY  03/18/2019   Procedure: POLYPECTOMY;  Surgeon: Rush Landmark Telford Nab., MD;  Location: [place];  Service: Gastroenterology;;   SUBMUCOSAL TATTOO INJECTION  03/18/2019   Procedure: SUBMUCOSAL TATTOO INJECTION;  Surgeon: Irving Copas., MD;  Location: [place];  Service: Gastroenterology;;   VIDEO BRONCHOSCOPY WITH ENDOBRONCHIAL NAVIGATION N/A 05/03/2020   Procedure: VIDEO BRONCHOSCOPY WITH ENDOBRONCHIAL NAVIGATION;  Surgeon: Collene Gobble, MD;  Location: [place] ENDOSCOPY;  Service: Pulmonary;  Laterality: N/A;    OB History   No obstetric history on file.      Home Medications    Prior to Admission medications   Medication Sig Start Date End Date Taking? Authorizing Provider  cyclobenzaprine (FLEXERIL) 5 MG tablet Take 1 tablet (5 mg total) by mouth 3 (three) times daily as needed. 12/11/20   Drenda Freeze, MD  fluticasone [facility]) 50 MCG/ACT nasal spray Use 1-2 sprays per nostril  once daily as needed for sinus congestion or allergy symptoms. 09/20/20   Jeralyn Bennett, MD  fluticasone (FLOVENT HFA) 110 MCG/ACT inhaler Inhale 2 puffs into the lungs 2 (two) times daily. 09/20/20   Jeralyn Bennett, MD  gabapentin (NEURONTIN) 300 MG capsule Take 1 capsule (300 mg total) by mouth 3 (three) times daily. 11/16/20   Sater, Nanine Means, MD  lamoTRIgine (LAMICTAL) 25 MG tablet Take 1 tablet (25 mg total) by mouth daily for 5 days, THEN 1 tablet (25 mg total) 2 (two) times daily for 5 days, THEN 1 tablet (25 mg total) in the morning, at noon, and at bedtime for 5 days, THEN 2 tablets (50 mg total) 2 (two) times daily for 5 days. 12/05/20 12/25/20  Sater, Nanine Means, MD  lamoTRIgine (LAMICTAL) 25 MG tablet Take 2 tablets (50 mg total) by mouth 2 (two) times daily. 12/08/20   Sater, Nanine Means, MD  losartan (COZAAR) 25 MG tablet Take 1 tablet (25 mg total) by mouth daily. 08/10/20   Mosetta Anis, MD  pantoprazole (PROTONIX) 40 MG tablet Take 1 tablet (40 mg total) by mouth daily. 09/06/20  Jeralyn Bennett, MD    Family History Family History  Problem Relation Age of Onset   Hypertension Mother    Colon cancer Neg Hx    Esophageal cancer Neg Hx    Inflammatory bowel disease Neg Hx    Liver disease Neg Hx    Pancreatic cancer Neg Hx    Rectal cancer Neg Hx    Stomach cancer Neg Hx     Social History Social History   Tobacco Use   Smoking status: Former    Types: Cigarettes    Quit date: 08/21/1995    Years since quitting: 25.3   Smokeless tobacco: Never   Tobacco comments:    Patient states she stopped smoking in 31 yrs ago (as of 2022)  Vaping Use   Vaping Use: Never used  Substance Use Topics   Alcohol use: No    Comment: in remission since 2007   Drug use: Not Currently    Comment: previously used marijuana      Allergies   Banana, Grape (artificial) flavor, Grape concord os [flavoring agent], Ibuprofen, and Penicillins   Review of Systems Review of Systems  As  stated above in HPI Physical Exam Triage Vital Signs ED Triage Vitals  Enc Vitals Group     BP 01/01/21 1353 (!) 154/75     Pulse Rate 01/01/21 1353 74     Resp 01/01/21 1353 18     Temp 01/01/21 1353 98.2 F (36.8 C)     Temp Source 01/01/21 1353 Oral     SpO2 01/01/21 1353 97 %     Weight --      Height --      Head Circumference --      Peak Flow --      Pain Score 01/01/21 1352 10     Pain Loc --      Pain Edu? --      Excl. in [place]? --    No data found.  Updated Vital Signs BP (!) 154/75 (BP Location: Right Arm)   Pulse 74   Temp 98.2 F (36.8 C) (Oral)   Resp 18   LMP 08/21/1982   SpO2 97%   Physical Exam Vitals and nursing note reviewed.  Constitutional:      Appearance: She is well-developed.     Comments: No abnormalities of voice  HENT:     Head: Normocephalic and atraumatic.     Mouth/Throat:     Mouth: Mucous membranes are moist.     Pharynx: Oropharynx is clear.     Tonsils: No tonsillar exudate or tonsillar abscesses.     Comments: There is a white film on her tongue along with 2 very small scattered patches that appear stuck on of her posterior throat Eyes:     Conjunctiva/sclera: Conjunctivae normal.     Pupils: Pupils are equal, round, and reactive to light.  Neck:     Thyroid: No thyromegaly.  Cardiovascular:     Rate and Rhythm: Normal rate and regular rhythm.     Heart sounds: Normal heart sounds.  Pulmonary:     Effort: Pulmonary effort is normal.     Breath sounds: Normal breath sounds.  Musculoskeletal:     Cervical back: Normal range of motion and neck supple.  Lymphadenopathy:     Cervical: No cervical adenopathy.  Skin:    General: Skin is warm.  Neurological:     Mental Status: She is alert.     UC Treatments / Results  Labs (all labs ordered are listed, but only abnormal results are displayed) Labs Reviewed - No data to display  EKG   Radiology No results found.  Procedures Procedures (including critical care  time)  Medications Ordered in UC Medications - No data to display  Initial Impression / Assessment and Plan / UC Course  I have reviewed the triage vital signs and the nursing notes.  Pertinent labs & imaging results that were available during my care of the patient were reviewed by me and considered in my medical decision making (see chart for details).     Symptoms continue to be problematic for patient without improvement.  Organ to switch her off of Protonix onto Nexium and working to start Magic mouthwash swish and swallow while she waits for her appointment on Thursday.  I have encouraged her to keep her appointment on Thursday. Discussed red flag signs and symptoms.  Final Clinical Impressions(s) / UC Diagnoses   Final diagnoses:  None   Discharge Instructions   None    ED Prescriptions   None    PDMP not reviewed this encounter.   Hughie Closs, [place] 01/01/21 1504

## 2021-01-02 ENCOUNTER — Ambulatory Visit: Payer: Medicare Other | Admitting: Neurology

## 2021-01-05 ENCOUNTER — Telehealth: Payer: Self-pay | Admitting: Gastroenterology

## 2021-01-05 NOTE — Telephone Encounter (Signed)
Inbound call from patient. Requesting a call back to discuss what she could take to help her swallow and breath because its interfering with sleep at night. She has an appointment with Dr. Rush Landmark 9/21.

## 2021-01-05 NOTE — Telephone Encounter (Signed)
The pt states she has dysphagia to solids and liquids.  She also complains of reflux that is interfering with her sleep.  She is taking nexium 40 mg daily.  She has not been seen in 2 years in our office.  She has an appt with Dr Rush Landmark on 9/21. I offered to try and find an appt with an APP but she does not wish to see anyone but Dr Rush Landmark.  She has an appt to see her PCP tomorrow to discuss.  She will keep the appt with Dr Rush Landmark as planned.  She will call back if her PCP recommends sooner appt with our office.

## 2021-01-06 ENCOUNTER — Other Ambulatory Visit: Payer: Self-pay | Admitting: Student

## 2021-01-06 DIAGNOSIS — K219 Gastro-esophageal reflux disease without esophagitis: Secondary | ICD-10-CM

## 2021-01-12 NOTE — Telephone Encounter (Signed)
The appt has been moved to 8/29 at 130 pm with Kimberly [name].  The pt has been advised.

## 2021-01-12 NOTE — Telephone Encounter (Signed)
Pt called stating her thyroids are still giving her lots of pain. Pt requesting a call back.

## 2021-01-12 NOTE — Telephone Encounter (Addendum)
Pt called requesting a sooner appt than 9/21. She stated that she saw her PCP and was told that she needs to be seen soon. She is willing to see an APP and she is available on Mondays and Tuesdays.

## 2021-01-12 NOTE — Telephone Encounter (Addendum)
Called pt back. She reports she is still having difficulty with thyroid. Wanted to know what could be going on. She has appt w/ Amy Esterwood 01/23/21 (GI) per pt. They are going to do further eval. Advised she should follow up w/ PCP or endocrinologist about thyroid issues.  Advised appt on 01/23/21 appears to be f/u for hernia. She verbalized understanding.

## 2021-01-13 ENCOUNTER — Other Ambulatory Visit: Payer: Self-pay

## 2021-01-13 ENCOUNTER — Telehealth: Payer: Self-pay | Admitting: Neurology

## 2021-01-13 ENCOUNTER — Emergency Department (HOSPITAL_COMMUNITY)
Admission: EM | Admit: 2021-01-13 | Discharge: 2021-01-13 | Disposition: A | Discharge status: Home / Self Care | Payer: Medicare Other | Attending: Emergency Medicine | Admitting: Emergency Medicine

## 2021-01-13 DIAGNOSIS — Z85118 Personal history of other malignant neoplasm of bronchus and lung: Secondary | ICD-10-CM | POA: Diagnosis not present

## 2021-01-13 DIAGNOSIS — Z87891 Personal history of nicotine dependence: Secondary | ICD-10-CM | POA: Insufficient documentation

## 2021-01-13 DIAGNOSIS — R531 Weakness: Secondary | ICD-10-CM | POA: Insufficient documentation

## 2021-01-13 DIAGNOSIS — Z7952 Long term (current) use of systemic steroids: Secondary | ICD-10-CM | POA: Insufficient documentation

## 2021-01-13 DIAGNOSIS — Z8616 Personal history of COVID-19: Secondary | ICD-10-CM | POA: Diagnosis not present

## 2021-01-13 DIAGNOSIS — R202 Paresthesia of skin: Secondary | ICD-10-CM | POA: Insufficient documentation

## 2021-01-13 DIAGNOSIS — Z79899 Other long term (current) drug therapy: Secondary | ICD-10-CM | POA: Diagnosis not present

## 2021-01-13 DIAGNOSIS — J45909 Unspecified asthma, uncomplicated: Secondary | ICD-10-CM | POA: Insufficient documentation

## 2021-01-13 DIAGNOSIS — I10 Essential (primary) hypertension: Secondary | ICD-10-CM | POA: Diagnosis not present

## 2021-01-13 DIAGNOSIS — R200 Anesthesia of skin: Secondary | ICD-10-CM | POA: Diagnosis present

## 2021-01-13 NOTE — Discharge Instructions (Addendum)
1.  Discussed medication changes with your doctor as planned.

## 2021-01-13 NOTE — ED Notes (Signed)
Pt verbalized understanding of d/c instructions, meds and followup care. Denies questions. VSS, no distress noted. Steady gait to exit.  

## 2021-01-13 NOTE — ED Provider Notes (Signed)
[place] EMERGENCY DEPARTMENT Provider Note   CSN: [id number] Arrival date & time: 01/13/21  1201     History Chief Complaint  Patient presents with   Numbness    Kimberly [name] is a 65 y.o. female.  HPI Patient reports that she thinks she slept wrong.  She woke up and she just did not feel like herself.  She is convinced many of her medications are interacting negatively.  She reports she is on a lot of supplements that she does not think that she needs over the doses are too high.  Patient reports all of her symptoms have resolved now she feels better.    Past Medical History:  Diagnosis Date   Alcohol abuse, in remission    since around 7846   Alcoholic hepatitis    fatty liver on imaging- Treated Hepatits C and treated   Allergic rhinitis    Anemia    EGD with duodenal AVM, normal colonoscopy 04/2012   Asthma    Callus of foot 2009   Cancer [facility])    Chest pain, musculoskeletal 2011   Cholelithiasis    [place] 09/2008: Cholelithiasis is present, s/p cholecystectomy   Depression    Excessive cerumen in ear canal    since before 2000   GERD (gastroesophageal reflux disease)    History of blood transfusion    Hyperlipidemia    Hypertension    Menopausal syndrome    Treated with estradiol in the past - discontinued 04/2012   MENOPAUSAL SYNDROME 09/24/2006   2008 Note : The pt is adament about having this medication.  She fully understands the increased risk of heart disease and cancer that is associated with HRT and is willing to accept this risk in order to prevent the debilitating hot flashes she has off this medication.  Will continue to encourage her to try to wean herself off of this medication at our next visit.  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 ([place]) 05/12/2020   Mass of upper lobe of right lung 05/03/2020   COVID-19 09/25/2019   History of dysuria 09/24/2019   Rhinosinusitis 08/27/2019   Dysuria 05/25/2019   Tachycardia 05/25/2019   Hematuria 05/25/2019   AVM (arteriovenous malformation) of small bowel, 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   Health care maintenance 03/25/2013   Depression 12/03/2012   Iron deficiency anemia 05/08/2012   Insomnia 05/07/2012   Weight loss 07/27/2009   HLD (hyperlipidemia) 03/25/2008   Constipation 11/04/2007   Essential hypertension 09/24/2006   Allergies 09/24/2006   Asthma 09/24/2006   GERD 09/24/2006    Past Surgical History:  Procedure Laterality Date   ABDOMINAL HYSTERECTOMY  1980s   BIOPSY  03/18/2019   Procedure: BIOPSY;  Surgeon: Irving Copas., MD;  Location: [place];  Service: Gastroenterology;;   BRONCHIAL BIOPSY  05/03/2020   Procedure: BRONCHIAL BIOPSIES;  Surgeon: Collene Gobble, MD;  Location: [facility] ENDOSCOPY;  Service: Pulmonary;;   BRONCHIAL BRUSHINGS  05/03/2020   Procedure: BRONCHIAL BRUSHINGS;  Surgeon: Lamonte Sakai,  Rose Fillers, MD;  Location: [facility] ENDOSCOPY;  Service: Pulmonary;;   BRONCHIAL NEEDLE ASPIRATION BIOPSY  05/03/2020   Procedure: BRONCHIAL NEEDLE ASPIRATION BIOPSIES;  Surgeon: Collene Gobble, MD;  Location: [facility] ENDOSCOPY;  Service: Pulmonary;;   CHOLECYSTECTOMY  01/06/09   COLONOSCOPY  05/09/2012   Procedure: COLONOSCOPY;  Surgeon: Inda Castle, MD;  Location: [place];  Service: Endoscopy;  Laterality: N/A;   COLONOSCOPY WITH PROPOFOL N/A 03/18/2019   Procedure: COLONOSCOPY WITH PROPOFOL;  Surgeon: Rush Landmark Telford Nab., MD;  Location: [place];  Service: Gastroenterology;  Laterality: N/A;   ENTEROSCOPY  N/A 03/18/2019   Procedure: ENTEROSCOPY;  Surgeon: Rush Landmark Telford Nab., MD;  Location: [place];  Service: Gastroenterology;  Laterality: N/A;   ESOPHAGOGASTRODUODENOSCOPY  05/09/2012   Procedure: ESOPHAGOGASTRODUODENOSCOPY (EGD);  Surgeon: Inda Castle, MD;  Location: [place];  Service: Endoscopy;  Laterality: N/A;   FIDUCIAL MARKER PLACEMENT  05/03/2020   Procedure: FIDUCIAL MARKER PLACEMENT;  Surgeon: Collene Gobble, MD;  Location: [facility] ENDOSCOPY;  Service: Pulmonary;;   HEMOSTASIS CLIP PLACEMENT  03/18/2019   Procedure: HEMOSTASIS CLIP PLACEMENT;  Surgeon: Irving Copas., MD;  Location: [place];  Service: Gastroenterology;;   HOT HEMOSTASIS N/A 03/18/2019   Procedure: HOT HEMOSTASIS (ARGON PLASMA COAGULATION/BICAP);  Surgeon: Irving Copas., MD;  Location: [place];  Service: Gastroenterology;  Laterality: N/A;   POLYPECTOMY  03/18/2019   Procedure: POLYPECTOMY;  Surgeon: Rush Landmark Telford Nab., MD;  Location: [place];  Service: Gastroenterology;;   SUBMUCOSAL TATTOO INJECTION  03/18/2019   Procedure: SUBMUCOSAL TATTOO INJECTION;  Surgeon: Irving Copas., MD;  Location: [place];  Service: Gastroenterology;;   VIDEO BRONCHOSCOPY WITH ENDOBRONCHIAL NAVIGATION N/A 05/03/2020   Procedure: VIDEO BRONCHOSCOPY WITH ENDOBRONCHIAL NAVIGATION;  Surgeon: Collene Gobble, MD;  Location: [place] ENDOSCOPY;  Service: Pulmonary;  Laterality: N/A;     OB History   No obstetric history on file.     Family History  Problem Relation Age of Onset   Hypertension Mother    Colon cancer Neg Hx    Esophageal cancer Neg Hx    Inflammatory bowel disease Neg Hx    Liver disease Neg Hx    Pancreatic cancer Neg Hx    Rectal cancer Neg Hx    Stomach cancer Neg Hx     Social History   Tobacco Use   Smoking status: Former    Types: Cigarettes    Quit date: 08/21/1995    Years since quitting: 25.4   Smokeless tobacco: Never   Tobacco comments:     Patient states she stopped smoking in 31 yrs ago (as of 2022)  Vaping Use   Vaping Use: Never used  Substance Use Topics   Alcohol use: No    Comment: in remission since 2007   Drug use: Not Currently    Comment: previously used marijuana     Home Medications Prior to Admission medications   Medication Sig Start Date End Date Taking? Authorizing Provider  cyclobenzaprine (FLEXERIL) 5 MG tablet Take 1 tablet (5 mg total) by mouth 3 (three) times daily as needed. 12/11/20   Drenda Freeze, MD  esomeprazole (NEXIUM) 40 MG capsule Take 1 capsule (40 mg total) by mouth daily. 01/01/21   Hughie Closs, PA-C  fluticasone (FLONASE) 50 MCG/ACT nasal spray Use 1-2 sprays per nostril once daily as needed for sinus congestion or allergy symptoms. 09/20/20   Jeralyn Bennett, MD  fluticasone (FLOVENT HFA) 110 MCG/ACT inhaler Inhale 2 puffs into the lungs 2 (  two) times daily. 09/20/20   Jeralyn Bennett, MD  gabapentin (NEURONTIN) 300 MG capsule Take 1 capsule (300 mg total) by mouth 3 (three) times daily. 11/16/20   Sater, Nanine Means, MD  lamoTRIgine (LAMICTAL) 25 MG tablet Take 1 tablet (25 mg total) by mouth daily for 5 days, THEN 1 tablet (25 mg total) 2 (two) times daily for 5 days, THEN 1 tablet (25 mg total) in the morning, at noon, and at bedtime for 5 days, THEN 2 tablets (50 mg total) 2 (two) times daily for 5 days. 12/05/20 12/25/20  Sater, Nanine Means, MD  lamoTRIgine (LAMICTAL) 25 MG tablet Take 2 tablets (50 mg total) by mouth 2 (two) times daily. 12/08/20   Sater, Nanine Means, MD  losartan (COZAAR) 25 MG tablet Take 1 tablet (25 mg total) by mouth daily. 08/10/20   Mosetta Anis, MD  magic mouthwash (nystatin, lidocaine, diphenhydrAMINE, alum & mag hydroxide) suspension Swish and swallow 5 mLs 3 (three) times daily as needed for up to 12 days for mouth pain. 01/01/21 01/13/21  Hughie Closs, PA-C    Allergies    Banana, Grape (artificial) flavor, Grape concord os [flavoring agent], Ibuprofen,  and Penicillins  Review of Systems   Review of Systems 10 systems reviewed and negative except as per HPI Physical Exam Updated Vital Signs BP (!) 148/83   Pulse 73   Temp 98.5 F (36.9 C) (Oral)   Resp 16   Ht 5\' 4"  (1.626 m)   Wt 50 kg   LMP 08/21/1982   SpO2 97%   BMI 18.92 kg/m   Physical Exam Constitutional:      Appearance: Normal appearance.  HENT:     Head: Normocephalic and atraumatic.     Mouth/Throat:     Mouth: Mucous membranes are moist.     Pharynx: Oropharynx is clear.  Eyes:     Extraocular Movements: Extraocular movements intact.     Pupils: Pupils are equal, round, and reactive to light.  Cardiovascular:     Rate and Rhythm: Normal rate and regular rhythm.  Pulmonary:     Effort: Pulmonary effort is normal.     Breath sounds: Normal breath sounds.  Chest:     Chest wall: No tenderness.  Abdominal:     General: Abdomen is flat. There is no distension.     Palpations: Abdomen is soft.     Tenderness: There is no abdominal tenderness. There is no guarding.  Musculoskeletal:        General: No swelling or tenderness. Normal range of motion.     Cervical back: Neck supple.     Right lower leg: No edema.     Left lower leg: No edema.  Skin:    General: Skin is warm and dry.  Neurological:     General: No focal deficit present.     Mental Status: She is alert and oriented to person, place, and time.     Cranial Nerves: No cranial nerve deficit.     Sensory: No sensory deficit.     Motor: No weakness.     Coordination: Coordination normal.  Psychiatric:        Mood and Affect: Mood normal.    ED Results / Procedures / Treatments   Labs (all labs ordered are listed, but only abnormal results are displayed) Labs Reviewed - No data to display  EKG None  Radiology No results found.  Procedures Procedures   Medications Ordered in ED Medications - No  data to display  ED Course  I have reviewed the triage vital signs and the nursing  notes.  Pertinent labs & imaging results that were available during my care of the patient were reviewed by me and considered in my medical decision making (see chart for details).    MDM Rules/Calculators/A&P                           Patient has multiple presentations historically for paresthesias in various numbness areas.  At the time my evaluation, patient reports all of her symptoms have spontaneously resolved.  She is attributing her symptoms to adverse effect of high-dose on her vitamin supplements.  Patient reports she is going to remain compliant with her blood pressure medications and her reflux medications for sure but plans on discontinuing her supplements until she reviews them with her neurologist.  At this time patient is clinically well without any deficits on neurologic exam.  Heart and lung exam are normal.  Stable for discharge with close follow-up already scheduled. Final Clinical Impression(s) / ED Diagnoses Final diagnoses:  Weakness  Paresthesias    Rx / DC Orders ED Discharge Orders     None        Charlesetta Shanks, MD 01/13/21 1347

## 2021-01-13 NOTE — ED Triage Notes (Signed)
Pt reports vague and varying symptoms; reports L numbness x1 day, states her thyroid is "acting up," states she feels "funny" on the left side. No strength or motor deficits, VSS, talking on the phone at this time.

## 2021-01-13 NOTE — ED Notes (Signed)
Pt sitting up in stretcher talking on the phone to (reportedly) her neurologist. After getting off the phone pt talks at great length about vague symptoms, left numbness/stomach ulcer/thyroid problems; hyperverbal and some flight of ideas noted. Pt VSS, no distress noted, continues talking at length during assessment.

## 2021-01-13 NOTE — Telephone Encounter (Signed)
She called answering service from the ED that she was experiencing left sided numbness.  I spoke to her.   Recent MRI of the brain and cervical spine would not explain in entire arm.   She will stay to be evaluated.   If no explanation is found, we could try a higher dose of lamotrigine or gabapentin for the tingling

## 2021-01-17 ENCOUNTER — Telehealth: Payer: Self-pay | Admitting: Neurology

## 2021-01-17 ENCOUNTER — Other Ambulatory Visit: Payer: Self-pay

## 2021-01-17 ENCOUNTER — Ambulatory Visit (INDEPENDENT_AMBULATORY_CARE_PROVIDER_SITE_OTHER): Payer: Medicare Other | Admitting: Neurology

## 2021-01-17 ENCOUNTER — Encounter: Payer: Self-pay | Admitting: Neurology

## 2021-01-17 VITALS — BP 136/81 | HR 73 | Ht 64.0 in | Wt 99.5 lb

## 2021-01-17 DIAGNOSIS — M79605 Pain in left leg: Secondary | ICD-10-CM

## 2021-01-17 DIAGNOSIS — M79602 Pain in left arm: Secondary | ICD-10-CM | POA: Insufficient documentation

## 2021-01-17 DIAGNOSIS — M47812 Spondylosis without myelopathy or radiculopathy, cervical region: Secondary | ICD-10-CM

## 2021-01-17 DIAGNOSIS — R200 Anesthesia of skin: Secondary | ICD-10-CM | POA: Diagnosis not present

## 2021-01-17 MED ORDER — IMIPRAMINE HCL 25 MG PO TABS: 25.0000 mg | ORAL_TABLET | Freq: Every day | ORAL | 5 refills | Status: DC

## 2021-01-17 NOTE — Progress Notes (Signed)
[facility]  PATIENT: Kimberly [name] DOB: [birth year]  REFERRING DOCTOR OR PCP:  My Conley Rolls (Optometry); Loura Back, NP (PCP) SOURCE:   _________________________________   HISTORICAL  CHIEF COMPLAINT:  Chief Complaint  Patient presents with   Follow-up    New room, alone. Here to discuss meds. Pt stopped gabapentin/lamotrigine d/t hives.     HISTORY OF PRESENT ILLNESS:  Kimberly [name] is a 65 y.o. woman with left facial and limb numbness and unbalanced gait  Update 01/16/2021: She stopped gabapentin and lamotrigine due to hives.  She is having dysphagia and is seeing GI.   She had a Barium swallow showing some dysmotility  She continues to experience dysesthesias all over.   She did not get a benefit from gabapentin and lamotrigine caused hives.  Initially she felt she was doing better on lamotrigine but did not tolerate the higher dose due to itching and hives.      She is noting more insomnia.    She is uncomfortable at night, especially if on her left side.     MRI of the brain 11/03/2020 was essentially normal for age.  The MRI of the cervical spine 11/19/2020 showed some degenerative changes.  The spinal cord was normal.  The worst level was C3-C4 where there was a disc protrusion towards the left.  This could cause some neck pain and some left shoulder pain and numbness but would not be expected to cause numbness elsewhere.    She also had a CT scan of the neck showing some asymmetric fullness of the right oral pharyngeal mucosal but no definite masses.  She had emphysema and a small pulmonary nodule.  She had RadRx for a pulmonary nodule.   She stopped smoking.    IMAGING was personally reviewed. MRI of the brain 11/03/2020   It shows a few scattered punctate T2/FLAIR hyperintense foci consistent with age-appropriate minimal chronic microvascular ischemic changes.  There were no acute findings.  CT scan of the head 11/01/2020 was normal.  CT scan of the  cervical spine 11/01/2020 showed uncovertebral spurring and disc bulging at C3-C4 likely leading to spinal stenosis.  There was mild foraminal narrowing.  MRI of the cervical spine 11/19/2020 showed degenerative changes that were worse at C3-C4 with a disc protrusion towards the left causing foraminal narrowing but no definite nerve root compression.  MRI brain 11/03/2020 just showed mild chronic micorvascular ischemic change but no explanation of symptoms  REVIEW OF SYSTEMS: Constitutional: No fevers, chills, sweats, or change in appetite Eyes: No visual changes, double vision, eye pain Ear, nose and throat: No hearing loss, ear pain, nasal congestion, sore throat Cardiovascular: No chest pain, palpitations Respiratory:  No shortness of breath at rest or with exertion.   No wheezes GastrointestinaI: No nausea, vomiting, diarrhea, abdominal pain, fecal incontinence Genitourinary:  No dysuria, urinary retention or frequency.  No nocturia. Musculoskeletal:  No neck pain, back pain Integumentary: No rash, pruritus, skin lesions Neurological: as above Psychiatric: No depression at this time.  No anxiety Endocrine: No palpitations, diaphoresis, change in appetite, change in weigh or increased thirst Hematologic/Lymphatic:  No anemia, purpura, petechiae. Allergic/Immunologic: No itchy/runny eyes, nasal congestion, recent allergic reactions, rashes  ALLERGIES: Allergies  Allergen Reactions   Banana     Lip swelling   Gabapentin Hives   Grape (Artificial) Flavor Swelling    Allergic to grapes   Grape [place] [Flavoring Agent] Swelling    Allergic to grapes   Lamotrigine Hives  Ibuprofen Rash   Penicillins Rash    Did it involve swelling of the face/tongue/throat, SOB, or low BP? No Did it involve sudden or severe rash/hives, skin peeling, or any reaction on the inside of your mouth or nose? No Did you need to seek medical attention at a hospital or doctor's office? No When did it last  happen?      more than 10 years If all above answers are "NO", may proceed with cephalosporin use.     HOME MEDICATIONS:  Current Outpatient Medications:    cyclobenzaprine (FLEXERIL) 5 MG tablet, Take 1 tablet (5 mg total) by mouth 3 (three) times daily as needed., Disp: 10 tablet, Rfl: 0   esomeprazole (NEXIUM) 40 MG capsule, Take 1 capsule (40 mg total) by mouth daily., Disp: 30 capsule, Rfl: 0   fluticasone (FLONASE) 50 MCG/ACT nasal spray, Use 1-2 sprays per nostril once daily as needed for sinus congestion or allergy symptoms., Disp: 16 g, Rfl: 0   fluticasone (FLOVENT HFA) 110 MCG/ACT inhaler, Inhale 2 puffs into the lungs 2 (two) times daily., Disp: 36 g, Rfl: 1   imipramine (TOFRANIL) 25 MG tablet, Take 1 tablet (25 mg total) by mouth at bedtime., Disp: 30 tablet, Rfl: 5   losartan (COZAAR) 25 MG tablet, Take 1 tablet (25 mg total) by mouth daily., Disp: 90 tablet, Rfl: 3  PAST MEDICAL HISTORY: Past Medical History:  Diagnosis Date   Alcohol abuse, in remission    since around 2005   Alcoholic hepatitis    fatty liver on imaging- Treated Hepatits C and treated   Allergic rhinitis    Anemia    EGD with duodenal AVM, normal colonoscopy 04/2012   Asthma    Callus of foot 2009   [facility]    Chest pain, musculoskeletal 2011   Cholelithiasis    [place] 09/2008: Cholelithiasis is present, s/p cholecystectomy   Depression    Excessive cerumen in ear canal    since before 2000   GERD (gastroesophageal reflux disease)    History of blood transfusion    Hyperlipidemia    Hypertension    Menopausal syndrome    Treated with estradiol in the past - discontinued 04/2012   MENOPAUSAL SYNDROME 09/24/2006   2008 Note : The pt is adament about having this medication.  She fully understands the increased risk of heart disease and cancer that is associated with HRT and is willing to accept this risk in order to prevent the debilitating hot flashes she has off this medication.  Will continue  to encourage her to try to wean herself off of this medication at our next visit.  2009 note indicated that she tr   Otitis externa, acute Feb 2012   bilateral, treated with azithromycin after failure of neomycin drops   Otitis media, acute Feb 2012   PONV (postoperative nausea and vomiting)     PAST SURGICAL HISTORY: Past Surgical History:  Procedure Laterality Date   ABDOMINAL HYSTERECTOMY  1980s   BIOPSY  03/18/2019   Procedure: BIOPSY;  Surgeon: Lemar Lofty., MD;  Location: [facility] ENDOSCOPY;  Service: Gastroenterology;;   BRONCHIAL BIOPSY  05/03/2020   Procedure: BRONCHIAL BIOPSIES;  Surgeon: Leslye Peer, MD;  Location: [facility] ENDOSCOPY;  Service: Pulmonary;;   BRONCHIAL BRUSHINGS  05/03/2020   Procedure: BRONCHIAL BRUSHINGS;  Surgeon: Leslye Peer, MD;  Location[facility] ENDOSCOPY;  Service: Pulmonary;;   BRONCHIAL NEEDLE ASPIRATION BIOPSY  05/03/2020   Procedure: BRONCHIAL NEEDLE ASPIRATION BIOPSIES;  Surgeon:  Leslye Peer, MD;  Location: [facility] ENDOSCOPY;  Service: Pulmonary;;   CHOLECYSTECTOMY  01/06/09   COLONOSCOPY  05/09/2012   Procedure: COLONOSCOPY;  Surgeon: Louis Meckel, MD;  Location: [facility] ENDOSCOPY;  Service: Endoscopy;  Laterality: N/A;   COLONOSCOPY WITH PROPOFOL N/A 03/18/2019   Procedure: COLONOSCOPY WITH PROPOFOL;  Surgeon: Meridee Score Netty Starring., MD;  Location: [facility] ENDOSCOPY;  Service: Gastroenterology;  Laterality: N/A;   ENTEROSCOPY N/A 03/18/2019   Procedure: ENTEROSCOPY;  Surgeon: Meridee Score Netty Starring., MD;  Location: [facility] ENDOSCOPY;  Service: Gastroenterology;  Laterality: N/A;   ESOPHAGOGASTRODUODENOSCOPY  05/09/2012   Procedure: ESOPHAGOGASTRODUODENOSCOPY (EGD);  Surgeon: Louis Meckel, MD;  Location: [facility] ENDOSCOPY;  Service: Endoscopy;  Laterality: N/A;   FIDUCIAL MARKER PLACEMENT  05/03/2020   Procedure: FIDUCIAL MARKER PLACEMENT;  Surgeon: Leslye Peer, MD;  Location: [facility] ENDOSCOPY;  Service: Pulmonary;;   HEMOSTASIS CLIP PLACEMENT  03/18/2019    Procedure: HEMOSTASIS CLIP PLACEMENT;  Surgeon: Lemar Lofty., MD;  Location: [facility] ENDOSCOPY;  Service: Gastroenterology;;   HOT HEMOSTASIS N/A 03/18/2019   Procedure: HOT HEMOSTASIS (ARGON PLASMA COAGULATION/BICAP);  Surgeon: Lemar Lofty., MD;  Location: [facility] ENDOSCOPY;  Service: Gastroenterology;  Laterality: N/A;   POLYPECTOMY  03/18/2019   Procedure: POLYPECTOMY;  Surgeon: Meridee Score Netty Starring., MD;  Location: [facility] ENDOSCOPY;  Service: Gastroenterology;;   SUBMUCOSAL TATTOO INJECTION  03/18/2019   Procedure: SUBMUCOSAL TATTOO INJECTION;  Surgeon: Lemar Lofty., MD;  Location: [facility] ENDOSCOPY;  Service: Gastroenterology;;   VIDEO BRONCHOSCOPY WITH ENDOBRONCHIAL NAVIGATION N/A 05/03/2020   Procedure: VIDEO BRONCHOSCOPY WITH ENDOBRONCHIAL NAVIGATION;  Surgeon: Leslye Peer, MD;  Location: [facility] ENDOSCOPY;  Service: Pulmonary;  Laterality: N/A;    FAMILY HISTORY: Family History  Problem Relation Age of Onset   Hypertension Mother    Colon cancer Neg Hx    Esophageal cancer Neg Hx    Inflammatory bowel disease Neg Hx    Liver disease Neg Hx    Pancreatic cancer Neg Hx    Rectal cancer Neg Hx    Stomach cancer Neg Hx     SOCIAL HISTORY:  Social History   Socioeconomic History   Marital status: Single    Spouse name: Not on file   Number of children: 2   Years of education: 12   Highest education level: Not on file  Occupational History   Not on file  Tobacco Use   Smoking status: Former    Types: Cigarettes    Quit date: 08/21/1995    Years since quitting: 25.4   Smokeless tobacco: Never   Tobacco comments:    Patient states she stopped smoking in 31 yrs ago (as of 2022)  Vaping Use   Vaping Use: Never used  Substance and Sexual Activity   Alcohol use: No    Comment: in remission since 2007   Drug use: Not Currently    Comment: previously used marijuana    Sexual activity: Never  Other Topics Concern   Not on file  Social History Narrative    Right handed   Caffeine use: none   Lives alone   Social Determinants of Health   Financial Resource Strain: Not on file  Food Insecurity: Not on file  Transportation Needs: Not on file  Physical Activity: Not on file  Stress: Not on file  Social Connections: Not on file  Intimate Partner Violence: Not on file     PHYSICAL EXAM  Vitals:   01/17/21 0813  BP: 136/81  Pulse: 73  Weight: 99  lb 8 oz (45.1 kg)  Height: 5\' 4"  (1.626 m)    Body mass index is 17.08 kg/m.   General: The patient is well-developed and well-nourished and in no acute distress  HEENT:  Head is [place]/AT.  Sclera are anicteric.     Neck: Good range of motion.  The neck is nontender.  Skin/Extremities:  Extremities are without rash or  edema.  No bruising, open erythema, warmth at site of left proximal leg pain.  Mild muscle tenderness.  Neurologic Exam  Mental status: The patient is alert and oriented x 3 at the time of the examination. The patient has apparent normal recent and remote memory, with an apparently normal attention span and concentration ability.   Speech is normal.  Cranial nerves: Extraocular movements are full.  Facial strength and sensation was normal.  No obvious hearing deficits are noted.  Motor:  Muscle bulk is normal.   Tone is normal. Strength is  5 / 5 in all 4 extremities.   Sensory: She reports reduced touch sensation in left arm and leg and reduced vibration sensation in left leg.  Coordination: Cerebellar testing reveals good finger-nose-finger and heel-to-shin bilaterally.  Gait and station: Station is normal.   Gait is normal. Tandem gait is mildly wide.  . Romberg is negative.   Reflexes: Deep tendon reflexes are symmetric and normal; 2 in arms ad 3 in legs - no spread.   Plantar responses are flexor.    DIAGNOSTIC DATA (LABS, IMAGING, TESTING) - I reviewed patient records, labs, notes, testing and imaging myself where available.  Lab Results  Component Value  Date   WBC 6.3 12/14/2020   HGB 10.5 (L) 12/14/2020   HCT 34.1 (L) 12/14/2020   MCV 101.2 (H) 12/14/2020   PLT 285 12/14/2020      Component Value Date/Time   NA 138 12/14/2020 1044   NA 140 02/11/2020 1513   K 3.6 12/14/2020 1044   CL 105 12/14/2020 1044   CO2 26 12/14/2020 1044   GLUCOSE 85 12/14/2020 1044   BUN 9 12/14/2020 1044   BUN 9 02/11/2020 1513   CREATININE 0.76 12/14/2020 1044   CREATININE 0.75 08/09/2020 1007   CREATININE 0.76 10/15/2013 1329   CALCIUM 8.4 (L) 12/14/2020 1044   PROT 6.9 11/25/2020 1219   PROT 7.4 02/11/2020 1513   ALBUMIN 4.1 11/25/2020 1219   ALBUMIN 4.8 02/11/2020 1513   AST 23 11/25/2020 1219   AST 17 08/09/2020 1007   ALT 13 11/25/2020 1219   ALT 9 08/09/2020 1007   ALKPHOS 63 11/25/2020 1219   BILITOT 1.2 11/25/2020 1219   BILITOT 1.4 (H) 08/09/2020 1007   GFRNONAA >60 12/14/2020 1044   GFRNONAA >60 08/09/2020 1007   GFRAA >60 02/29/2020 0847   Lab Results  Component Value Date   CHOL 244 (H) 01/15/2018   HDL 50 01/15/2018   LDLCALC 172 (H) 01/15/2018   TRIG 112 01/15/2018   CHOLHDL 4.9 (H) 01/15/2018   No results found for: HGBA1C Lab Results  Component Value Date   VITAMINB12 331 01/29/2019   Lab Results  Component Value Date   TSH 1.868 11/25/2020       ASSESSMENT AND PLAN  Left leg pain - Plan: NCV with EMG(electromyography)  Left arm pain - Plan: NCV with EMG(electromyography)  Osteoarthritis of cervical spine, unspecified spinal osteoarthritis complication status  Numbness - Plan: NCV with EMG(electromyography)  C3C4 disc/spur might explain some shoulder and upper amr pain but not other symptoms:  D/c  gabapntin and lamotrigine .   Imipramine 25 mg nightly and increase to 50 based on response.  Hopefully this will help dysesthesias as well as insomnia. Stay active and exercise as tolerated.   NCV/EMG Rtc for above study and as needed for increased symptoms.  Lealer Marsland A. Epimenio Foot, MD, [facility] 01/17/2021, 9:18  AM Certified in Neurology, Clinical Neurophysiology, Sleep Medicine and Neuroimaging  [facility] [address], [address] [place], [place] [address] [phone]

## 2021-01-17 NOTE — Telephone Encounter (Signed)
Kimberly [name] called stating the imipramine (TOFRANIL) 25 MG tablet interacts with the pt's ZOLOFT. Wants to know if doctor is aware of that and if you want the pharmacy to proceed with filling the imipramine (TOFRANIL) 25 MG tablet. Threasa Beards is requesting a call back.

## 2021-01-17 NOTE — Telephone Encounter (Signed)
Called pharmacy back. Reports pt taking sertraline 25mg  po qhs. Last filled: 10/25/20 #90. However, after she took second look, prescribing MD cx rx sertraline on 01/06/21. No longer taking this. Can disregard this message.

## 2021-01-19 ENCOUNTER — Telehealth: Payer: Self-pay | Admitting: Neurology

## 2021-01-19 NOTE — Telephone Encounter (Signed)
Pt wants to know if anything can be called in for her while she waits for NCS/EMG, pt states her body is jittery

## 2021-01-19 NOTE — Telephone Encounter (Signed)
Called and spoke with patient. She feels imipramine not helping. She has only taken for two days. Recommended she try taking for at least a couple weeks before deciding if it helps or not. She will do this. EMG/NCS is on 02/08/21.

## 2021-01-20 ENCOUNTER — Emergency Department (HOSPITAL_COMMUNITY)
Admission: EM | Admit: 2021-01-20 | Discharge: 2021-01-20 | Disposition: A | Discharge status: Home / Self Care | Payer: Medicare Other | Attending: Emergency Medicine | Admitting: Emergency Medicine

## 2021-01-20 ENCOUNTER — Encounter (HOSPITAL_COMMUNITY): Payer: Self-pay | Admitting: Emergency Medicine

## 2021-01-20 ENCOUNTER — Other Ambulatory Visit: Payer: Self-pay | Admitting: Otolaryngology

## 2021-01-20 ENCOUNTER — Other Ambulatory Visit: Payer: Self-pay

## 2021-01-20 DIAGNOSIS — Z85118 Personal history of other malignant neoplasm of bronchus and lung: Secondary | ICD-10-CM | POA: Insufficient documentation

## 2021-01-20 DIAGNOSIS — Z7951 Long term (current) use of inhaled steroids: Secondary | ICD-10-CM | POA: Insufficient documentation

## 2021-01-20 DIAGNOSIS — R1310 Dysphagia, unspecified: Secondary | ICD-10-CM

## 2021-01-20 DIAGNOSIS — R531 Weakness: Secondary | ICD-10-CM | POA: Diagnosis present

## 2021-01-20 DIAGNOSIS — F419 Anxiety disorder, unspecified: Secondary | ICD-10-CM | POA: Diagnosis not present

## 2021-01-20 DIAGNOSIS — J45909 Unspecified asthma, uncomplicated: Secondary | ICD-10-CM | POA: Insufficient documentation

## 2021-01-20 DIAGNOSIS — Z8616 Personal history of COVID-19: Secondary | ICD-10-CM | POA: Insufficient documentation

## 2021-01-20 DIAGNOSIS — R000 Tachycardia, unspecified: Secondary | ICD-10-CM | POA: Insufficient documentation

## 2021-01-20 DIAGNOSIS — Z79899 Other long term (current) drug therapy: Secondary | ICD-10-CM | POA: Diagnosis not present

## 2021-01-20 DIAGNOSIS — Z87891 Personal history of nicotine dependence: Secondary | ICD-10-CM | POA: Insufficient documentation

## 2021-01-20 DIAGNOSIS — I10 Essential (primary) hypertension: Secondary | ICD-10-CM | POA: Insufficient documentation

## 2021-01-20 DIAGNOSIS — R6883 Chills (without fever): Secondary | ICD-10-CM | POA: Insufficient documentation

## 2021-01-20 LAB — COMPREHENSIVE METABOLIC PANEL
ALT: 11 U/L (ref 0–44)
AST: 25 U/L (ref 15–41)
Albumin: 4.7 g/dL (ref 3.5–5.0)
Alkaline Phosphatase: 69 U/L (ref 38–126)
Anion gap: 14 (ref 5–15)
BUN: 13 mg/dL (ref 8–23)
CO2: 23 mmol/L (ref 22–32)
Calcium: 9.4 mg/dL (ref 8.9–10.3)
Chloride: 100 mmol/L (ref 98–111)
Creatinine, Ser: 0.84 mg/dL (ref 0.44–1.00)
GFR, Estimated: >60 mL/min (ref >60)
Glucose, Bld: 73 mg/dL (ref 70–99)
Potassium: 3.9 mmol/L (ref 3.5–5.1)
Sodium: 137 mmol/L (ref 135–145)
Total Bilirubin: 1.5 mg/dL — ABNORMAL HIGH (ref 0.3–1.2)
Total Protein: 7.9 g/dL (ref 6.5–8.1)

## 2021-01-20 LAB — CBC WITH DIFFERENTIAL/PLATELET
Abs Immature Granulocytes: 0.01 10*3/uL (ref 0.00–0.07)
Basophils Absolute: 0.1 10*3/uL (ref 0.0–0.1)
Basophils Relative: 1 %
Eosinophils Absolute: 0 10*3/uL (ref 0.0–0.5)
Eosinophils Relative: 1 %
HCT: 46.9 % — ABNORMAL HIGH (ref 36.0–46.0)
Hemoglobin: 14.6 g/dL (ref 12.0–15.0)
Immature Granulocytes: 0 %
Lymphocytes Relative: 17 %
Lymphs Abs: 1 10*3/uL (ref 0.7–4.0)
MCH: 30.6 pg (ref 26.0–34.0)
MCHC: 31.1 g/dL (ref 30.0–36.0)
MCV: 98.3 fL (ref 80.0–100.0)
Monocytes Absolute: 0.3 10*3/uL (ref 0.1–1.0)
Monocytes Relative: 6 %
Neutro Abs: 4.4 10*3/uL (ref 1.7–7.7)
Neutrophils Relative %: 75 %
Platelets: 337 10*3/uL (ref 150–400)
RBC: 4.77 MIL/uL (ref 3.87–5.11)
RDW: 11.9 % (ref 11.5–15.5)
WBC: 5.8 10*3/uL (ref 4.0–10.5)
nRBC: 0 % (ref 0.0–0.2)

## 2021-01-20 LAB — TSH: TSH: 1.35 u[IU]/mL (ref 0.350–4.500)

## 2021-01-20 MED ORDER — DIAZEPAM 2 MG PO TABS: 2.0000 mg | ORAL_TABLET | Freq: Three times a day (TID) | ORAL | 0 refills | Status: DC | PRN

## 2021-01-20 MED ORDER — LACTATED RINGERS IV SOLN: INTRAVENOUS | Status: DC

## 2021-01-20 MED ORDER — LACTATED RINGERS IV BOLUS
1000.0000 mL | Freq: Once | INTRAVENOUS | Status: AC
  Administered 2021-01-20: 1000 mL via INTRAVENOUS

## 2021-01-20 MED ORDER — LORAZEPAM 2 MG/ML IJ SOLN: 1.0000 mg | Freq: Once | INTRAMUSCULAR | Status: DC

## 2021-01-20 MED ORDER — LORAZEPAM 2 MG/ML IJ SOLN
0.5000 mg | Freq: Once | INTRAMUSCULAR | Status: AC
  Administered 2021-01-20: 0.5 mg via INTRAVENOUS
  Filled 2021-01-20: qty 1

## 2021-01-20 NOTE — ED Provider Notes (Signed)
[facility] DEPT Provider Note   CSN: [id number] Arrival date & time: 01/20/21  1214     History No chief complaint on file.   Kimberly [name] is a 65 y.o. female.  65 year old female presents with several weeks of weakness and chills.  According to her, she was diagnosed with having hyperthyroidism.  She has not been on any new medications for this.  Patient is poorly scheduled to have a thyroidectomy.  She endorses insomnia as well as weight loss.  Denies any chest pain or shortness of breath.  States that she has trouble swallowing and feels as if food gets caught in her throat.  She called EMS her heart rate was found to be 130.  Transported here for further management      Past Medical History:  Diagnosis Date   Alcohol abuse, in remission    since around 1884   Alcoholic hepatitis    fatty liver on imaging- Treated Hepatits C and treated   Allergic rhinitis    Anemia    EGD with duodenal AVM, normal colonoscopy 04/2012   Asthma    Callus of foot 2009   Cancer [facility])    Chest pain, musculoskeletal 2011   Cholelithiasis    [place] 09/2008: Cholelithiasis is present, s/p cholecystectomy   Depression    Excessive cerumen in ear canal    since before 2000   GERD (gastroesophageal reflux disease)    History of blood transfusion    Hyperlipidemia    Hypertension    Menopausal syndrome    Treated with estradiol in the past - discontinued 04/2012   MENOPAUSAL SYNDROME 09/24/2006   2008 Note : The pt is adament about having this medication.  She fully understands the increased risk of heart disease and cancer that is associated with HRT and is willing to accept this risk in order to prevent the debilitating hot flashes she has off this medication.  Will continue to encourage her to try to wean herself off of this medication at our next visit.  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 ([place]) 05/12/2020   Mass of upper lobe of right lung 05/03/2020   COVID-19 09/25/2019   History of dysuria 09/24/2019   Rhinosinusitis 08/27/2019   Dysuria 05/25/2019   Tachycardia 05/25/2019   Hematuria 05/25/2019   AVM (arteriovenous malformation) of small bowel, 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   Health care maintenance 03/25/2013   Depression 12/03/2012   Iron deficiency anemia 05/08/2012   Insomnia 05/07/2012   Weight loss 07/27/2009   HLD (hyperlipidemia) 03/25/2008   Constipation 11/04/2007   Essential hypertension 09/24/2006   Allergies 09/24/2006   Asthma 09/24/2006   GERD 09/24/2006    Past Surgical History:  Procedure Laterality Date   ABDOMINAL HYSTERECTOMY  1980s   BIOPSY  03/18/2019   Procedure: BIOPSY;  Surgeon: Irving Copas., MD;  Location: [place];  Service: Gastroenterology;;   BRONCHIAL BIOPSY  05/03/2020  Procedure: BRONCHIAL BIOPSIES;  Surgeon: Collene Gobble, MD;  Location: [facility] ENDOSCOPY;  Service: Pulmonary;;   BRONCHIAL BRUSHINGS  05/03/2020   Procedure: BRONCHIAL BRUSHINGS;  Surgeon: Collene Gobble, MD;  Location: [facility] ENDOSCOPY;  Service: Pulmonary;;   BRONCHIAL NEEDLE ASPIRATION BIOPSY  05/03/2020   Procedure: BRONCHIAL NEEDLE ASPIRATION BIOPSIES;  Surgeon: Collene Gobble, MD;  Location: [facility] ENDOSCOPY;  Service: Pulmonary;;   CHOLECYSTECTOMY  01/06/09   COLONOSCOPY  05/09/2012   Procedure: COLONOSCOPY;  Surgeon: Inda Castle, MD;  Location: [place];  Service: Endoscopy;  Laterality: N/A;   COLONOSCOPY WITH  PROPOFOL N/A 03/18/2019   Procedure: COLONOSCOPY WITH PROPOFOL;  Surgeon: Rush Landmark Telford Nab., MD;  Location: [place];  Service: Gastroenterology;  Laterality: N/A;   ENTEROSCOPY N/A 03/18/2019   Procedure: ENTEROSCOPY;  Surgeon: Rush Landmark Telford Nab., MD;  Location: [place];  Service: Gastroenterology;  Laterality: N/A;   ESOPHAGOGASTRODUODENOSCOPY  05/09/2012   Procedure: ESOPHAGOGASTRODUODENOSCOPY (EGD);  Surgeon: Inda Castle, MD;  Location: [place];  Service: Endoscopy;  Laterality: N/A;   FIDUCIAL MARKER PLACEMENT  05/03/2020   Procedure: FIDUCIAL MARKER PLACEMENT;  Surgeon: Collene Gobble, MD;  Location[facility];  Service: Pulmonary;;   HEMOSTASIS CLIP PLACEMENT  03/18/2019   Procedure: HEMOSTASIS CLIP PLACEMENT;  Surgeon: Irving Copas., MD;  Location: [place];  Service: Gastroenterology;;   HOT HEMOSTASIS N/A 03/18/2019   Procedure: HOT HEMOSTASIS (ARGON PLASMA COAGULATION/BICAP);  Surgeon: Irving Copas., MD;  Location: [place];  Service: Gastroenterology;  Laterality: N/A;   POLYPECTOMY  03/18/2019   Procedure: POLYPECTOMY;  Surgeon: Rush Landmark Telford Nab., MD;  Location: [place];  Service: Gastroenterology;;   SUBMUCOSAL TATTOO INJECTION  03/18/2019   Procedure: SUBMUCOSAL TATTOO INJECTION;  Surgeon: Irving Copas., MD;  Location: [place];  Service: Gastroenterology;;   VIDEO BRONCHOSCOPY WITH ENDOBRONCHIAL NAVIGATION N/A 05/03/2020   Procedure: VIDEO BRONCHOSCOPY WITH ENDOBRONCHIAL NAVIGATION;  Surgeon: Collene Gobble, MD;  Location: [place] ENDOSCOPY;  Service: Pulmonary;  Laterality: N/A;     OB History   No obstetric history on file.     Family History  Problem Relation Age of Onset   Hypertension Mother    Colon cancer Neg Hx    Esophageal cancer Neg Hx    Inflammatory bowel disease Neg Hx    Liver disease Neg Hx    Pancreatic cancer Neg Hx    Rectal cancer Neg Hx    Stomach cancer Neg Hx      Social History   Tobacco Use   Smoking status: Former    Types: Cigarettes    Quit date: 08/21/1995    Years since quitting: 25.4   Smokeless tobacco: Never   Tobacco comments:    Patient states she stopped smoking in 31 yrs ago (as of 2022)  Vaping Use   Vaping Use: Never used  Substance Use Topics   Alcohol use: No    Comment: in remission since 2007   Drug use: Not Currently    Comment: previously used marijuana     Home Medications Prior to Admission medications   Medication Sig Start Date End Date Taking? Authorizing Provider  cyclobenzaprine (FLEXERIL) 5 MG tablet Take 1 tablet (5 mg total) by mouth 3 (three) times daily as needed. 12/11/20   Drenda Freeze, MD  esomeprazole (NEXIUM) 40 MG capsule Take 1 capsule (40 mg total) by mouth daily. 01/01/21   Hughie Closs, PA-C  fluticasone (FLONASE) 50 MCG/ACT nasal spray Use 1-2 sprays per nostril  once daily as needed for sinus congestion or allergy symptoms. 09/20/20   Jeralyn Bennett, MD  fluticasone (FLOVENT HFA) 110 MCG/ACT inhaler Inhale 2 puffs into the lungs 2 (two) times daily. 09/20/20   Jeralyn Bennett, MD  imipramine (TOFRANIL) 25 MG tablet Take 1 tablet (25 mg total) by mouth at bedtime. 01/17/21   Sater, Nanine Means, MD  losartan (COZAAR) 25 MG tablet Take 1 tablet (25 mg total) by mouth daily. 08/10/20   Mosetta Anis, MD    Allergies    Banana, Gabapentin, Grape (artificial) flavor, Grape concord os [flavoring agent], Lamotrigine, Ibuprofen, and Penicillins  Review of Systems   Review of Systems  All other systems reviewed and are negative.  Physical Exam Updated Vital Signs Pulse 97   Resp 15   LMP 08/21/1982   SpO2 100%   Physical Exam Vitals and nursing note reviewed.  Constitutional:      General: She is not in acute distress.    Appearance: Normal appearance. She is well-developed. She is not toxic-appearing.  HENT:     Head: Normocephalic and atraumatic.  Eyes:     General: Lids are  normal.     Conjunctiva/sclera: Conjunctivae normal.     Pupils: Pupils are equal, round, and reactive to light.  Neck:     Thyroid: No thyroid mass.     Trachea: No tracheal deviation.  Cardiovascular:     Rate and Rhythm: Regular rhythm. Tachycardia present.     Heart sounds: Normal heart sounds. No murmur heard.   No gallop.  Pulmonary:     Effort: Pulmonary effort is normal. No respiratory distress.     Breath sounds: Normal breath sounds. No stridor. No decreased breath sounds, wheezing, rhonchi or rales.  Abdominal:     General: There is no distension.     Palpations: Abdomen is soft.     Tenderness: There is no abdominal tenderness. There is no rebound.  Musculoskeletal:        General: No tenderness. Normal range of motion.     Cervical back: Normal range of motion and neck supple.  Skin:    General: Skin is warm and dry.     Findings: No abrasion or rash.  Neurological:     Mental Status: She is alert and oriented to person, place, and time. Mental status is at baseline.     GCS: GCS eye subscore is 4. GCS verbal subscore is 5. GCS motor subscore is 6.     Cranial Nerves: Cranial nerves are intact. No cranial nerve deficit.     Sensory: No sensory deficit.     Motor: Motor function is intact.  Psychiatric:        Attention and Perception: Attention normal.        Mood and Affect: Mood is anxious.        Speech: Speech normal.        Behavior: Behavior normal.    ED Results / Procedures / Treatments   Labs (all labs ordered are listed, but only abnormal results are displayed) Labs Reviewed  CBC WITH DIFFERENTIAL/PLATELET  COMPREHENSIVE METABOLIC PANEL  T4  TSH    EKG None  Radiology No results found.  Procedures Procedures   Medications Ordered in ED Medications  lactated ringers bolus 1,000 mL (has no administration in time range)  lactated ringers infusion (has no administration in time range)    ED Course  I have reviewed the triage vital signs  and the nursing notes.  Pertinent labs & imaging results that were available during my care of the patient were reviewed by me and considered in my medical decision making (see chart for details).    MDM Rules/Calculators/A&P                          Pt has h/o anxiety and is very anxious here Given ativan and IV fluids.  Patient initially tachycardic and this is much improved.  At time of discharge it is down to 60.  Do not believe patient is on thyroid storm here.  TSH was noted and not significantly abnormal.  Patient's abnormal labs concerning her thyroid were from early July.  Patient encouraged to reschedule her endocrinology appointment which she missed today.  Return precautions given.  Will prescribe hydroxyzine  Final Clinical Impression(s) / ED Diagnoses Final diagnoses:  None    Rx / DC Orders ED Discharge Orders     None        Lacretia Leigh, MD 01/20/21 1600

## 2021-01-21 LAB — T4

## 2021-01-23 ENCOUNTER — Encounter (HOSPITAL_COMMUNITY): Payer: Self-pay

## 2021-01-23 ENCOUNTER — Emergency Department (HOSPITAL_COMMUNITY)
Admission: EM | Admit: 2021-01-23 | Discharge: 2021-01-23 | Disposition: A | Discharge status: Home / Self Care | Payer: Medicare Other | Attending: Emergency Medicine | Admitting: Emergency Medicine

## 2021-01-23 ENCOUNTER — Encounter: Payer: Self-pay | Admitting: Physician Assistant

## 2021-01-23 ENCOUNTER — Ambulatory Visit (INDEPENDENT_AMBULATORY_CARE_PROVIDER_SITE_OTHER): Payer: Medicare Other | Admitting: Physician Assistant

## 2021-01-23 ENCOUNTER — Other Ambulatory Visit: Payer: Self-pay

## 2021-01-23 VITALS — BP 134/80 | HR 115 | Ht 64.0 in | Wt 96.1 lb

## 2021-01-23 DIAGNOSIS — Z8616 Personal history of COVID-19: Secondary | ICD-10-CM | POA: Diagnosis not present

## 2021-01-23 DIAGNOSIS — Z79899 Other long term (current) drug therapy: Secondary | ICD-10-CM | POA: Insufficient documentation

## 2021-01-23 DIAGNOSIS — Z7951 Long term (current) use of inhaled steroids: Secondary | ICD-10-CM | POA: Diagnosis not present

## 2021-01-23 DIAGNOSIS — Z85118 Personal history of other malignant neoplasm of bronchus and lung: Secondary | ICD-10-CM | POA: Insufficient documentation

## 2021-01-23 DIAGNOSIS — I10 Essential (primary) hypertension: Secondary | ICD-10-CM | POA: Diagnosis not present

## 2021-01-23 DIAGNOSIS — Z87891 Personal history of nicotine dependence: Secondary | ICD-10-CM | POA: Insufficient documentation

## 2021-01-23 DIAGNOSIS — R1013 Epigastric pain: Secondary | ICD-10-CM

## 2021-01-23 DIAGNOSIS — R000 Tachycardia, unspecified: Secondary | ICD-10-CM | POA: Diagnosis not present

## 2021-01-23 DIAGNOSIS — R634 Abnormal weight loss: Secondary | ICD-10-CM

## 2021-01-23 DIAGNOSIS — F419 Anxiety disorder, unspecified: Secondary | ICD-10-CM | POA: Diagnosis not present

## 2021-01-23 DIAGNOSIS — K219 Gastro-esophageal reflux disease without esophagitis: Secondary | ICD-10-CM | POA: Insufficient documentation

## 2021-01-23 DIAGNOSIS — Z8719 Personal history of other diseases of the digestive system: Secondary | ICD-10-CM | POA: Diagnosis not present

## 2021-01-23 DIAGNOSIS — R1310 Dysphagia, unspecified: Secondary | ICD-10-CM

## 2021-01-23 DIAGNOSIS — J45909 Unspecified asthma, uncomplicated: Secondary | ICD-10-CM | POA: Diagnosis not present

## 2021-01-23 LAB — TROPONIN I (HIGH SENSITIVITY): Troponin I (High Sensitivity): 5 ng/L (ref <18)

## 2021-01-23 MED ORDER — PANTOPRAZOLE SODIUM 40 MG PO TBEC: 40.0000 mg | DELAYED_RELEASE_TABLET | Freq: Every day | ORAL | 0 refills | Status: DC

## 2021-01-23 MED ORDER — LIDOCAINE VISCOUS HCL 2 % MT SOLN
15.0000 mL | Freq: Once | OROMUCOSAL | Status: AC
  Administered 2021-01-23: 15 mL via ORAL
  Filled 2021-01-23: qty 15

## 2021-01-23 MED ORDER — ALUM & MAG HYDROXIDE-SIMETH 200-200-20 MG/5ML PO SUSP
30.0000 mL | Freq: Once | ORAL | Status: AC
  Administered 2021-01-23: 30 mL via ORAL
  Filled 2021-01-23: qty 30

## 2021-01-23 MED ORDER — SUCRALFATE 1 GM/10ML PO SUSP: ORAL | 0 refills | Status: DC

## 2021-01-23 MED ORDER — PANTOPRAZOLE SODIUM 40 MG PO TBEC: 40.0000 mg | DELAYED_RELEASE_TABLET | Freq: Two times a day (BID) | ORAL | 3 refills | Status: DC

## 2021-01-23 MED ORDER — LORAZEPAM 2 MG/ML IJ SOLN
1.0000 mg | Freq: Once | INTRAMUSCULAR | Status: AC
  Administered 2021-01-23: 1 mg via INTRAMUSCULAR
  Filled 2021-01-23: qty 1

## 2021-01-23 NOTE — Discharge Instructions (Addendum)
You need to take your Protonix every day to avoid symptoms of abdominal pain. This was prescribed to you on a recent visit and sent to your pharmacy. I am providing another prescription so that you can take it to any pharmacy to complete. Take one every day to avoid the pain you were having tonight.   See your doctor for recheck and to insure your pain is resolving.

## 2021-01-23 NOTE — Patient Instructions (Addendum)
If you are age 67 or older, your body mass index should be between 23-30. Your Body mass index is 16.5 kg/m. If this is out of the aforementioned range listed, please consider follow up with your Primary Care Provider. __________________________________________________________  The [facility] providers would like to encourage you to use [facility] to communicate with providers for non-urgent requests or questions.  Due to long hold times on the telephone, sending your provider a message by [facility] may be a faster and more efficient way to get a response.  Please allow 48 business hours for a response.  Please remember that this is for non-urgent requests.   You have been scheduled for an endoscopy. Please follow written instructions given to you at your visit today. If you use inhalers (even only as needed), please bring them with you on the day of your procedure.  Increase your Pantoprazole to 40 mg twice daily before meals  Start Carafate 1 gram 1 tablet between meals and 1 tablet at bedtime.  Start drinking Ensure or Boost 3 times daily.  Hold fosamax for now.  Follow up pending.  Thank you for entrusting me with your care and choosing [facility].  Amy Esterwood, PA-C

## 2021-01-23 NOTE — ED Provider Notes (Signed)
[facility]-EMERGENCY DEPT Provider Note   CSN: [id number] Arrival date & time: 01/23/21  0440     History Chief Complaint  Patient presents with   Hernia    Kimberly [name] is a 65 y.o. female.  Patient to ED with complaint of "pain from my hernia". She reports she has a hiatal hernia that has caused similar pain in the past that is described as epigastric pain that radiates to the chest, burning in nature, increased belching. No vomiting. Pain has been progressive over the last one month. It is constant without identified alleviating or aggravating factors. She denies taking any of her medications previously prescribed and cannot name what they were. She denies fever, vomiting, urinary symptoms, SOB, cough or fever.   The history is provided by the patient. No language interpreter was used.      Past Medical History:  Diagnosis Date   Alcohol abuse, in remission    since around 2005   Alcoholic hepatitis    fatty liver on imaging- Treated Hepatits C and treated   Allergic rhinitis    Anemia    EGD with duodenal AVM, normal colonoscopy 04/2012   Asthma    Callus of foot 2009   Cancer [facility])    Chest pain, musculoskeletal 2011   Cholelithiasis    [place] 09/2008: Cholelithiasis is present, s/p cholecystectomy   Depression    Excessive cerumen in ear canal    since before 2000   GERD (gastroesophageal reflux disease)    History of blood transfusion    Hyperlipidemia    Hypertension    Menopausal syndrome    Treated with estradiol in the past - discontinued 04/2012   MENOPAUSAL SYNDROME 09/24/2006   2008 Note : The pt is adament about having this medication.  She fully understands the increased risk of heart disease and cancer that is associated with HRT and is willing to accept this risk in order to prevent the debilitating hot flashes she has off this medication.  Will continue to encourage her to try to wean herself off of this medication at our next visit.   2009 note indicated that she tr   Otitis externa, acute Feb 2012   bilateral, treated with azithromycin after failure of neomycin drops   Otitis media, acute Feb 2012   PONV (postoperative nausea and vomiting)     Patient Active Problem List   Diagnosis Date Noted   Left arm pain 01/17/2021   Left leg pain 01/17/2021   Numbness 11/16/2020   Facial pain 11/16/2020   Osteoarthritis of cervical spine 11/16/2020   Left-sided chest wall pain 07/12/2020   Chest wall muscle strain 07/12/2020   Malignant neoplasm of bronchus of right upper lobe (HCC) 05/12/2020   Mass of upper lobe of right lung 05/03/2020   COVID-19 09/25/2019   History of dysuria 09/24/2019   Rhinosinusitis 08/27/2019   Dysuria 05/25/2019   Tachycardia 05/25/2019   Hematuria 05/25/2019   AVM (arteriovenous malformation) of small bowel, 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   Health care maintenance 03/25/2013   Depression 12/03/2012   Iron deficiency anemia 05/08/2012   Insomnia 05/07/2012   Weight loss 07/27/2009   HLD (hyperlipidemia) 03/25/2008   Constipation 11/04/2007   Essential hypertension 09/24/2006   Allergies 09/24/2006   Asthma 09/24/2006   GERD 09/24/2006    Past Surgical History:  Procedure Laterality Date   ABDOMINAL HYSTERECTOMY  1980s   BIOPSY  03/18/2019   Procedure: BIOPSY;  Surgeon: Lemar Lofty., MD;  Location: [facility] ENDOSCOPY;  Service: Gastroenterology;;   BRONCHIAL BIOPSY  05/03/2020   Procedure: BRONCHIAL BIOPSIES;  Surgeon: Collene Gobble, MD;  Location: [facility] ENDOSCOPY;  Service: Pulmonary;;   BRONCHIAL BRUSHINGS  05/03/2020   Procedure: BRONCHIAL BRUSHINGS;  Surgeon: Collene Gobble, MD;  Location: [facility] ENDOSCOPY;  Service: Pulmonary;;   BRONCHIAL NEEDLE ASPIRATION BIOPSY  05/03/2020   Procedure: BRONCHIAL NEEDLE ASPIRATION BIOPSIES;  Surgeon: Collene Gobble, MD;  Location: [facility] ENDOSCOPY;  Service: Pulmonary;;   CHOLECYSTECTOMY  01/06/09   COLONOSCOPY  05/09/2012   Procedure: COLONOSCOPY;   Surgeon: Inda Castle, MD;  Location: [place];  Service: Endoscopy;  Laterality: N/A;   COLONOSCOPY WITH PROPOFOL N/A 03/18/2019   Procedure: COLONOSCOPY WITH PROPOFOL;  Surgeon: Rush Landmark Telford Nab., MD;  Location: [place];  Service: Gastroenterology;  Laterality: N/A;   ENTEROSCOPY N/A 03/18/2019   Procedure: ENTEROSCOPY;  Surgeon: Rush Landmark Telford Nab., MD;  Location: [place];  Service: Gastroenterology;  Laterality: N/A;   ESOPHAGOGASTRODUODENOSCOPY  05/09/2012   Procedure: ESOPHAGOGASTRODUODENOSCOPY (EGD);  Surgeon: Inda Castle, MD;  Location: [place];  Service: Endoscopy;  Laterality: N/A;   FIDUCIAL MARKER PLACEMENT  05/03/2020   Procedure: FIDUCIAL MARKER PLACEMENT;  Surgeon: Collene Gobble, MD;  Location: [facility] ENDOSCOPY;  Service: Pulmonary;;   HEMOSTASIS CLIP PLACEMENT  03/18/2019   Procedure: HEMOSTASIS CLIP PLACEMENT;  Surgeon: Irving Copas., MD;  Location: [place];  Service: Gastroenterology;;   HOT HEMOSTASIS N/A 03/18/2019   Procedure: HOT HEMOSTASIS (ARGON PLASMA COAGULATION/BICAP);  Surgeon: Irving Copas., MD;  Location: [place];  Service: Gastroenterology;  Laterality: N/A;   POLYPECTOMY  03/18/2019   Procedure: POLYPECTOMY;  Surgeon: Rush Landmark Telford Nab., MD;  Location: [place];  Service: Gastroenterology;;   SUBMUCOSAL TATTOO INJECTION  03/18/2019   Procedure: SUBMUCOSAL TATTOO INJECTION;  Surgeon: Irving Copas., MD;  Location: [place];  Service: Gastroenterology;;   VIDEO BRONCHOSCOPY WITH ENDOBRONCHIAL NAVIGATION N/A 05/03/2020   Procedure: VIDEO BRONCHOSCOPY WITH ENDOBRONCHIAL NAVIGATION;  Surgeon: Collene Gobble, MD;  Location: [place] ENDOSCOPY;  Service: Pulmonary;  Laterality: N/A;     OB History   No obstetric history on file.     Family History  Problem Relation Age of Onset   Hypertension Mother    Colon cancer Neg Hx    Esophageal cancer Neg Hx    Inflammatory bowel disease Neg  Hx    Liver disease Neg Hx    Pancreatic cancer Neg Hx    Rectal cancer Neg Hx    Stomach cancer Neg Hx     Social History   Tobacco Use   Smoking status: Former    Types: Cigarettes    Quit date: 08/21/1995    Years since quitting: 25.4   Smokeless tobacco: Never   Tobacco comments:    Patient states she stopped smoking in 31 yrs ago (as of 2022)  Vaping Use   Vaping Use: Never used  Substance Use Topics   Alcohol use: No    Comment: in remission since 2007   Drug use: Not Currently    Comment: previously used marijuana     Home Medications Prior to Admission medications   Medication Sig Start Date End Date Taking? Authorizing Provider  alendronate (FOSAMAX) 70 MG tablet Take 70 mg by mouth once a week. 01/02/21   [provider]  amLODipine (NORVASC) 5 MG tablet Take 5 mg by mouth daily. 12/26/20   [provider]  atorvastatin (LIPITOR) 20 MG tablet Take 20 mg  by mouth at bedtime. 10/17/20   [provider]  cyclobenzaprine (FLEXERIL) 5 MG tablet Take 1 tablet (5 mg total) by mouth 3 (three) times daily as needed. Patient taking differently: Take 5 mg by mouth 3 (three) times daily as needed for muscle spasms. 12/11/20   Drenda Freeze, MD  diazepam (VALIUM) 2 MG tablet Take 1 tablet (2 mg total) by mouth every 8 (eight) hours as needed for anxiety. 01/20/21   Lacretia Leigh, MD  esomeprazole (NEXIUM) 40 MG capsule Take 1 capsule (40 mg total) by mouth daily. 01/01/21   Hughie Closs, PA-C  fluticasone (FLONASE) 50 MCG/ACT nasal spray Use 1-2 sprays per nostril once daily as needed for sinus congestion or allergy symptoms. 09/20/20   Jeralyn Bennett, MD  fluticasone (FLOVENT HFA) 110 MCG/ACT inhaler Inhale 2 puffs into the lungs 2 (two) times daily. 09/20/20   Jeralyn Bennett, MD  gabapentin (NEURONTIN) 300 MG capsule Take 300 mg by mouth 3 (three) times daily.    [provider]  hydrOXYzine (ATARAX/VISTARIL) 25 MG tablet Take 25 mg by  mouth 3 (three) times daily as needed for anxiety. 10/19/20   [provider]  imipramine (TOFRANIL) 25 MG tablet Take 1 tablet (25 mg total) by mouth at bedtime. 01/17/21   Sater, Nanine Means, MD  lamoTRIgine (LAMICTAL) 25 MG tablet Take 50 mg by mouth 2 (two) times daily.    [provider]  losartan (COZAAR) 25 MG tablet Take 1 tablet (25 mg total) by mouth daily. 08/10/20   Mosetta Anis, MD  montelukast (SINGULAIR) 10 MG tablet Take 10 mg by mouth every evening. 01/06/21   [provider]  pantoprazole (PROTONIX) 40 MG tablet Take 40 mg by mouth daily. 01/10/21   [provider]  sertraline (ZOLOFT) 25 MG tablet Take 25 mg by mouth daily. 10/25/20   [provider]    Allergies    Banana, Gabapentin, Grape (artificial) flavor, Grape concord os [flavoring agent], Lamotrigine, Ibuprofen, and Penicillins  Review of Systems   Review of Systems  Constitutional:  Negative for chills and fever.  HENT: Negative.    Respiratory: Negative.  Negative for shortness of breath.   Cardiovascular:  Positive for chest pain.  Gastrointestinal:  Positive for abdominal pain. Negative for nausea and vomiting.  Genitourinary:  Negative for dysuria.  Musculoskeletal: Negative.  Negative for back pain and myalgias.  Skin: Negative.   Neurological: Negative.  Negative for weakness.   Physical Exam Updated Vital Signs BP (!) 129/105   Pulse (!) 105   Temp 98.1 F (36.7 C) (Oral)   Resp 19   LMP 08/21/1982   SpO2 100%   Physical Exam Vitals and nursing note reviewed.  Constitutional:      Appearance: She is well-developed.  HENT:     Head: Normocephalic.  Cardiovascular:     Rate and Rhythm: Regular rhythm. Tachycardia present.     Heart sounds: No murmur heard. Pulmonary:     Effort: Pulmonary effort is normal.     Breath sounds: Normal breath sounds. No wheezing, rhonchi or rales.  Abdominal:     General: Bowel sounds are normal.     Palpations:  Abdomen is soft.     Tenderness: There is abdominal tenderness (Tender in the epigastrium). There is no guarding or rebound.  Musculoskeletal:        General: Normal range of motion.     Cervical back: Normal range of motion and neck supple.  Skin:  General: Skin is warm and dry.  Neurological:     General: No focal deficit present.     Mental Status: She is alert and oriented to person, place, and time.    ED Results / Procedures / Treatments   Labs (all labs ordered are listed, but only abnormal results are displayed) Labs Reviewed  TROPONIN I (HIGH SENSITIVITY)    EKG EKG Interpretation  Date/Time:  Monday January 23 2021 05:53:49 EDT Ventricular Rate:  102 PR Interval:  120 QRS Duration: 89 QT Interval:  347 QTC Calculation: 452 R Axis:   73 Text Interpretation: Sinus tachycardia Confirmed by Dory Horn) on 01/23/2021 5:58:23 AM  Radiology No results found.  Procedures Procedures   Medications Ordered in ED Medications  alum & mag hydroxide-simeth (MAALOX/MYLANTA) 200-200-20 MG/5ML suspension 30 mL (30 mLs Oral Given 01/23/21 0606)    And  lidocaine (XYLOCAINE) 2 % viscous mouth solution 15 mL (15 mLs Oral Given 01/23/21 0606)  LORazepam (ATIVAN) injection 1 mg (1 mg Intramuscular Given 01/23/21 0606)    ED Course  I have reviewed the triage vital signs and the nursing notes.  Pertinent labs & imaging results that were available during my care of the patient were reviewed by me and considered in my medical decision making (see chart for details).    MDM Rules/Calculators/A&P                           Patient to ED with one month of progressive epigastric pain she relates to her hiatal hernia. Not taking any medications for same.   On initial interview the patient reports early on that she wants to be admitted. Discussed that if we found anything that met criteria for admission we would definitely do that, but if not she would be discharged home.  She appears anxious, HR 125.   Chart reviewed. Patient seen 8/26 without mention of "hiatal hernia" pain. Seen 8/16 and pain of complaint had resolved prior to being evaluated but end discussion involved her agreeing to take her reflux medications. She cannot remember if she takes anything at this time.   EKG does not reveal any ischemia. Troponin is negative x1. GI cocktail provided. Also provided Ativan for obvious signs of anxiety with history of same and tachycardia. There is no SoB, cough. Do not feel HR related to PE.   On recheck, she appears more comfortable. She has a normal HR and is relaxing. Discussed that her work up is negative and she needs to take her protonix daily. She is felt appropriate for discharge home.  Final Clinical Impression(s) / ED Diagnoses Final diagnoses:  None   Reflux with history of hiatal hernia Anxiety  Rx / DC Orders ED Discharge Orders     None        Dennie Bible 01/23/21 4827    Palumbo, April, MD 01/23/21 2330

## 2021-01-23 NOTE — ED Triage Notes (Signed)
Pt states that she has an abdominal hernia and has been hurting for over a month now. Pt states that the pain is unbearable.

## 2021-01-24 ENCOUNTER — Encounter: Payer: Self-pay | Admitting: Physician Assistant

## 2021-01-24 NOTE — Progress Notes (Signed)
Subjective:    Patient ID: Kimberly [name], female    DOB: Oct 13, 1955, 65 y.o.   MRN: [id number]  HPI Kimberly [name] is a pleasant 65 year old African-American female, established with Kimberly [name], who comes in today with complaints of epigastric and lower chest discomfort which has been present over the past few months but progressive. She had been in the ER earlier today and was discharged after EKG showed no ischemia and troponins were negative.  She was given a GI cocktail and asked to keep her GI follow-up appointment.  Patient says that she is having dysphagia to solid foods occasionally requiring regurgitation.  She is also having some mild odynophagia symptoms with solids and liquids.  She feels that her pills are frequently getting stuck as well.  She has been on Protonix 40 mg p.o. daily chronically.  She has had a gradual weight loss from 110 pounds to 95 pounds over the past 3 to 4 months.  She also endorses some epigastric discomfort. She was recently started on Fosamax. She did have a barium swallow done on 12/30/2020 which showed a tiny hiatal hernia and mild dysmotility, no stricture. 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  She says she completed radiation treatment in January 2022. At follow-up in March 2022 per Kimberly [name] she was to have follow-up chest CT in 6 months.  Last procedures here colonoscopy October 2020 when she was having some bleeding and was found to have internal hemorrhoids and 2 diminutive polyps which were both tubular adenomas.  At enteroscopy she had a 2 cm hiatal hernia, patchy gastritis and for duodenal AVMs which were treated with APC.  Small bowel biopsies were negative, gastric biopsies negative other than mild reactive gastropathy/no H.  pylori.  Review of Systems.Pertinent positive and negative review of systems were noted in the above HPI section.  All other review of systems was otherwise negative.    Outpatient Encounter Medications as of 01/23/2021  Medication Sig   alendronate (FOSAMAX) 70 MG tablet Take 70 mg by mouth once a week.   fluticasone (FLONASE) 50 MCG/ACT nasal spray Use 1-2 sprays per nostril once daily as needed for sinus congestion or allergy symptoms.   fluticasone (FLOVENT HFA) 110 MCG/ACT inhaler Inhale 2 puffs into the lungs 2 (two) times daily.   imipramine (TOFRANIL) 25 MG tablet Take 1 tablet (25 mg total) by mouth at bedtime.   losartan (COZAAR) 25 MG tablet Take 1 tablet (25 mg total) by mouth daily.   montelukast (SINGULAIR) 10 MG tablet Take 10 mg by mouth every evening.   sucralfate (CARAFATE) 1 GM/10ML suspension Take 1 gram between meals and at bedtime   [DISCONTINUED] pantoprazole (PROTONIX) 40 MG tablet Take 1 tablet (40 mg total) by mouth daily.   pantoprazole (PROTONIX) 40 MG tablet Take 1 tablet (40 mg total) by mouth 2 (two) times daily before a meal.   [DISCONTINUED] amLODipine (NORVASC) 5 MG tablet Take 5 mg by mouth daily.   [DISCONTINUED] atorvastatin (LIPITOR) 20 MG tablet Take 20 mg by mouth at bedtime.   [DISCONTINUED] cyclobenzaprine (FLEXERIL) 5 MG tablet Take 1 tablet (5 mg total) by mouth 3 (three) times daily as needed. (Patient taking differently: Take 5 mg by mouth 3 (three) times daily as needed for muscle spasms.)   [DISCONTINUED] diazepam (  VALIUM) 2 MG tablet Take 1 tablet (2 mg total) by mouth every 8 (eight) hours as needed for anxiety.   [DISCONTINUED] esomeprazole (NEXIUM) 40 MG capsule Take 1 capsule (40 mg total) by mouth daily.   [DISCONTINUED] gabapentin (NEURONTIN) 300 MG capsule Take 300 mg by mouth 3 (three) times daily.   [DISCONTINUED] hydrOXYzine (ATARAX/VISTARIL) 25 MG tablet Take 25 mg by mouth 3 (three) times daily as needed for anxiety.   [DISCONTINUED]  lamoTRIgine (LAMICTAL) 25 MG tablet Take 50 mg by mouth 2 (two) times daily.   [DISCONTINUED] sertraline (ZOLOFT) 25 MG tablet Take 25 mg by mouth daily.   No facility-administered encounter medications on file as of 01/23/2021.   Allergies  Allergen Reactions   Banana     Lip swelling   Gabapentin Hives   Grape (Artificial) Flavor Swelling    Allergic to grapes   Grape Concord Os [Flavoring Agent] Swelling    Allergic to grapes   Lamotrigine Hives   Ibuprofen Rash   Penicillins Rash    Did it involve swelling of the face/tongue/throat, SOB, or low BP? No Did it involve sudden or severe rash/hives, skin peeling, or any reaction on the inside of your mouth or nose? No Did you need to seek medical attention at a hospital or doctor's office? No When did it last happen?      more than 10 years If all above answers are "NO", may proceed with cephalosporin use.    Patient Active Problem List   Diagnosis Date Noted   Left arm pain 01/17/2021   Left leg pain 01/17/2021   Numbness 11/16/2020   Facial pain 11/16/2020   Osteoarthritis of cervical spine 11/16/2020   Left-sided chest wall pain 07/12/2020   Chest wall muscle strain 07/12/2020   Malignant neoplasm of bronchus of right upper lobe (HCC) 05/12/2020   Mass of upper lobe of right lung 05/03/2020   COVID-19 09/25/2019   History of dysuria 09/24/2019   Rhinosinusitis 08/27/2019   Dysuria 05/25/2019   Tachycardia 05/25/2019   Hematuria 05/25/2019   AVM (arteriovenous malformation) of small bowel, 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   Health care maintenance 03/25/2013   Depression 12/03/2012   Iron deficiency anemia 05/08/2012   Insomnia 05/07/2012   Weight loss 07/27/2009   HLD (hyperlipidemia) 03/25/2008   Constipation 11/04/2007   Essential hypertension 09/24/2006   Allergies 09/24/2006   Asthma 09/24/2006   GERD 09/24/2006   Social History   Socioeconomic History   Marital status: Single    Spouse name: Not on file   Number of  children: 2   Years of education: 12   Highest education level: Not on file  Occupational History   Not on file  Tobacco Use   Smoking status: Former    Types: Cigarettes    Quit date: 08/21/1995    Years since quitting: 25.4   Smokeless tobacco: Never   Tobacco comments:    Patient states she stopped smoking in 31 yrs ago (as of 2022)  Vaping Use   Vaping Use: Never used  Substance and Sexual Activity   Alcohol use: No    Comment: in remission since 2007   Drug use: Not Currently    Comment: previously used marijuana    Sexual activity: Never  Other Topics Concern   Not on file  Social History Narrative   Right handed   Caffeine use: none   Lives alone   Social Determinants of Health   Financial Resource Strain: Not on file  Food Insecurity: Not on file  Transportation Needs: Not on file  Physical Activity: Not on file  Stress: Not on file  Social Connections: Not on file  Intimate Partner Violence: Not on file    Ms. Staszewski's family history includes Hypertension in her mother.      Objective:    Vitals:   01/23/21 1326  BP: 134/80  Pulse: (!) 115    Physical Exam Well-developed frail appearing older African-American female in no acute distress.  Height, Weight, 96 BMI 16.5  HEENT; nontraumatic normocephalic, EOMI, PE R LA, sclera anicteric. Oropharynx; not done today Neck; supple, no JVD Cardiovascular; regular rate and rhythm with S1-S2, no murmur rub or gallop Pulmonary; Clear bilaterally Abdomen; soft, there is tenderness in the epigastrium, no guarding or rebound nondistended, no palpable mass or hepatosplenomegaly, bowel sounds are active Rectal; not done Skin; benign exam, no jaundice rash or appreciable lesions Extremities; no clubbing cyanosis or edema skin warm and dry Neuro/Psych; alert and oriented x4, grossly nonfocal mood and affect appropriate        Assessment & Plan:   #17 65 year old African-American female with history of stage IV  multifocal adenocarcinoma involving the right upper lobe and left lower lobe for which she completed radiation therapy January 2022. #2 chronic GERD-patient now presenting with 2 to 68-month history of dysphagia and odynophagia primarily to solids and pills but has had some discomfort recently with liquids as well.  She has had an associated 15 pound weight loss. Recent barium swallow did not confirm stricture, just mild dysmotility. Patient symptoms are out of proportion to the findings on barium swallow She is also having epigastric discomfort.  I am not convinced that her symptoms are all secondary to dysmotility, need to rule out esophagitis, esophageal ulceration, consider radiation-induced esophageal disease, rule out neoplasm Rule out progression of adenocarcinoma of the lung to the abdomen  #2 history of adenomatous colon polyps-up-to-date with colonoscopy last done October 2020 #3 history of duodenal AVMs status post APC October 2020 #4 status postcholecystectomy 5.  Prior history of EtOH abuse  Plan: Soft diet as tolerates Increase pantoprazole to 40 mg p.o. twice daily AC breakfast and AC dinner Add Carafate liquid 1 g between meals and at bedtime Hold Fosamax for now Patient will be scheduled for upper endoscopy with Kimberly [name].  Procedure was discussed in detail with the patient including indications risks and benefits and she is agreeable to proceed. If EGD is unrevealing she will need cross-sectional imaging with CT of the chest and abdomen.  Intisar Claudio Oswald Hillock PA-C 01/24/2021   Cc: Loura Back, NP

## 2021-01-24 NOTE — Progress Notes (Signed)
Attending Physician's Attestation   I have reviewed the chart.   I agree with the Advanced Practitioner's note, impression, and recommendations with any updates as below.    Jonty Morrical Mansouraty, MD [facility] Advanced Endoscopy Office # [phone]  

## 2021-01-26 ENCOUNTER — Telehealth: Payer: Self-pay | Admitting: Gastroenterology

## 2021-01-26 NOTE — Telephone Encounter (Signed)
The pt has been advised that she can begin a full liquid diet including; puddings, yogurt, mashed potatoes, apple sauce, etc.until her EGD next week.  She has been advised to avoid tough foods, meats, and to chew her food well if it is not a full liquid and take small bites.  The pt has been advised of the information and verbalized understanding.

## 2021-01-26 NOTE — Telephone Encounter (Signed)
Patient called having issues with swallowing seeking advise until her EGD scheduled on 01/31/21.

## 2021-01-27 ENCOUNTER — Emergency Department (HOSPITAL_COMMUNITY): Payer: Medicare Other

## 2021-01-27 ENCOUNTER — Encounter (HOSPITAL_COMMUNITY): Payer: Self-pay | Admitting: Emergency Medicine

## 2021-01-27 ENCOUNTER — Emergency Department (HOSPITAL_COMMUNITY)
Admission: EM | Admit: 2021-01-27 | Discharge: 2021-01-27 | Disposition: A | Discharge status: Home / Self Care | Payer: Medicare Other | Attending: Emergency Medicine | Admitting: Emergency Medicine

## 2021-01-27 DIAGNOSIS — Z7951 Long term (current) use of inhaled steroids: Secondary | ICD-10-CM | POA: Insufficient documentation

## 2021-01-27 DIAGNOSIS — I10 Essential (primary) hypertension: Secondary | ICD-10-CM | POA: Insufficient documentation

## 2021-01-27 DIAGNOSIS — M79605 Pain in left leg: Secondary | ICD-10-CM

## 2021-01-27 DIAGNOSIS — Z79899 Other long term (current) drug therapy: Secondary | ICD-10-CM | POA: Insufficient documentation

## 2021-01-27 DIAGNOSIS — Z87891 Personal history of nicotine dependence: Secondary | ICD-10-CM | POA: Insufficient documentation

## 2021-01-27 DIAGNOSIS — J45909 Unspecified asthma, uncomplicated: Secondary | ICD-10-CM | POA: Diagnosis not present

## 2021-01-27 DIAGNOSIS — S80852A Superficial foreign body, left lower leg, initial encounter: Secondary | ICD-10-CM

## 2021-01-27 DIAGNOSIS — Z8616 Personal history of COVID-19: Secondary | ICD-10-CM | POA: Insufficient documentation

## 2021-01-27 DIAGNOSIS — Z85118 Personal history of other malignant neoplasm of bronchus and lung: Secondary | ICD-10-CM | POA: Diagnosis not present

## 2021-01-27 DIAGNOSIS — M79652 Pain in left thigh: Secondary | ICD-10-CM | POA: Diagnosis not present

## 2021-01-27 MED ORDER — TRAMADOL HCL 50 MG PO TABS: 50.0000 mg | ORAL_TABLET | Freq: Four times a day (QID) | ORAL | 0 refills | Status: DC | PRN

## 2021-01-27 NOTE — Discharge Instructions (Addendum)
Follow up with the Orthopaedist for evaluation

## 2021-01-27 NOTE — ED Provider Notes (Signed)
[place] DEPT Provider Note  CSN: [id number] Arrival date & time: 01/27/21  1059     History Chief Complaint  Patient presents with  . Arm Pain  . Leg Pain    Kimberly [name] is a 65 y.o. female.  The history is provided by the patient. No language interpreter was used.  Leg Pain Location:  Leg Time since incident:  1 week Injury: no   Leg location:  L upper leg Pain details:    Quality:  Aching   Radiates to:  Does not radiate   Duration:  1 week   Timing:  Constant Chronicity:  New Relieved by:  Nothing Worsened by:  Nothing Pt complains of pain left upper inner thigh.  Pt thinks she hit something     Past Medical History:  Diagnosis Date  . Alcohol abuse, in remission    since around 2005  . Alcoholic hepatitis    fatty liver on imaging- Treated Hepatits C and treated  . Allergic rhinitis   . Anemia    EGD with duodenal AVM, normal colonoscopy 04/2012  . Asthma   . Callus of foot 2009  . Cancer ([place])   . Chest pain, musculoskeletal 2011  . Cholelithiasis    [place] 09/2008: Cholelithiasis is present, s/p cholecystectomy  . Depression   . Excessive cerumen in ear canal    since before 2000  . GERD (gastroesophageal reflux disease)   . History of blood transfusion   . Hyperlipidemia   . Hypertension   . Menopausal syndrome    Treated with estradiol in the past - discontinued 04/2012  . MENOPAUSAL SYNDROME 09/24/2006   2008 Note : The pt is adament about having this medication.  She fully understands the increased risk of heart disease and cancer that is associated with HRT and is willing to accept this risk in order to prevent the debilitating hot flashes she has off this medication.  Will continue to encourage her to try to wean herself off of this medication at our next visit.  2009 note indicated that she tr  . Otitis externa, acute Feb 2012   bilateral, treated with azithromycin after failure of neomycin drops  . Otitis media,  acute Feb 2012  . PONV (postoperative nausea and vomiting)     Patient Active Problem List   Diagnosis Date Noted  . Left arm pain 01/17/2021  . Left leg pain 01/17/2021  . Numbness 11/16/2020  . Facial pain 11/16/2020  . Osteoarthritis of cervical spine 11/16/2020  . Left-sided chest wall pain 07/12/2020  . Chest wall muscle strain 07/12/2020  . Malignant neoplasm of bronchus of right upper lobe ([place]) 05/12/2020  . Mass of upper lobe of right lung 05/03/2020  . COVID-19 09/25/2019  . History of dysuria 09/24/2019  . Rhinosinusitis 08/27/2019  . Dysuria 05/25/2019  . Tachycardia 05/25/2019  . Hematuria 05/25/2019  . 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 Health care maintenance 03/25/2013  . Depression 12/03/2012  . Iron deficiency anemia 05/08/2012  . Insomnia 05/07/2012  . Weight loss 07/27/2009  . HLD (hyperlipidemia) 03/25/2008  . Constipation 11/04/2007  . Essential hypertension 09/24/2006  . Allergies 09/24/2006  . Asthma 09/24/2006  . GERD 09/24/2006    Past Surgical History:  Procedure Laterality Date  . ABDOMINAL HYSTERECTOMY  1980s  . BIOPSY  03/18/2019   Procedure: BIOPSY;  Surgeon: Rush Landmark Telford Nab., MD;  Location: [place];  Service: Gastroenterology;;  . BRONCHIAL BIOPSY  05/03/2020   Procedure: BRONCHIAL BIOPSIES;  Surgeon: Collene Gobble, MD;  Location: [facility] ENDOSCOPY;  Service: Pulmonary;;  . BRONCHIAL BRUSHINGS  05/03/2020   Procedure: BRONCHIAL BRUSHINGS;  Surgeon: Collene Gobble, MD;  Location: [facility] ENDOSCOPY;  Service: Pulmonary;;  . BRONCHIAL NEEDLE ASPIRATION BIOPSY  05/03/2020   Procedure: BRONCHIAL NEEDLE ASPIRATION BIOPSIES;  Surgeon: Collene Gobble, MD;  Location: [facility] ENDOSCOPY;  Service: Pulmonary;;  . CHOLECYSTECTOMY  01/06/09  . COLONOSCOPY  05/09/2012   Procedure: COLONOSCOPY;  Surgeon: Inda Castle, MD;  Location: [place];  Service: Endoscopy;  Laterality: N/A;  . COLONOSCOPY WITH PROPOFOL  N/A 03/18/2019   Procedure: COLONOSCOPY WITH PROPOFOL;  Surgeon: Rush Landmark Telford Nab., MD;  Location: [place];  Service: Gastroenterology;  Laterality: N/A;  . ENTEROSCOPY N/A 03/18/2019   Procedure: ENTEROSCOPY;  Surgeon: Rush Landmark Telford Nab., MD;  Location: [place];  Service: Gastroenterology;  Laterality: N/A;  . ESOPHAGOGASTRODUODENOSCOPY  05/09/2012   Procedure: ESOPHAGOGASTRODUODENOSCOPY (EGD);  Surgeon: Inda Castle, MD;  Location: [place];  Service: Endoscopy;  Laterality: N/A;  . FIDUCIAL MARKER PLACEMENT  05/03/2020   Procedure: FIDUCIAL MARKER PLACEMENT;  Surgeon: Collene Gobble, MD;  Location: [facility] ENDOSCOPY;  Service: Pulmonary;;  . HEMOSTASIS CLIP PLACEMENT  03/18/2019   Procedure: HEMOSTASIS CLIP PLACEMENT;  Surgeon: Irving Copas., MD;  Location: [place];  Service: Gastroenterology;;  . HOT HEMOSTASIS N/A 03/18/2019   Procedure: HOT HEMOSTASIS (ARGON PLASMA COAGULATION/BICAP);  Surgeon: Irving Copas., MD;  Location: [place];  Service: Gastroenterology;  Laterality: N/A;  . POLYPECTOMY  03/18/2019   Procedure: POLYPECTOMY;  Surgeon: Rush Landmark Telford Nab., MD;  Location: [place];  Service: Gastroenterology;;  . [place] INJECTION  03/18/2019   Procedure: SUBMUCOSAL TATTOO INJECTION;  Surgeon: Irving Copas., MD;  Location: [place];  Service: Gastroenterology;;  . VIDEO BRONCHOSCOPY WITH ENDOBRONCHIAL NAVIGATION N/A 05/03/2020   Procedure: VIDEO BRONCHOSCOPY WITH ENDOBRONCHIAL NAVIGATION;  Surgeon: Collene Gobble, MD;  Location: [place] ENDOSCOPY;  Service: Pulmonary;  Laterality: N/A;     OB History   No obstetric history on file.     Family History  Problem Relation Age of Onset  . Hypertension Mother   . Colon cancer Neg Hx   . Esophageal cancer Neg Hx   . Inflammatory bowel disease Neg Hx   . Liver disease Neg Hx   . Pancreatic cancer Neg Hx   . Rectal cancer Neg Hx   . Stomach cancer Neg Hx      Social History   Tobacco Use  . Smoking status: Former    Types: Cigarettes    Quit date: 08/21/1995    Years since quitting: 25.4  . Smokeless tobacco: Never  . Tobacco comments:    Patient states she stopped smoking in 31 yrs ago (as of 2022)  Vaping Use  . Vaping Use: Never used  Substance Use Topics  . Alcohol use: No    Comment: in remission since 2007  . Drug use: Not Currently    Comment: previously used marijuana     Home Medications Prior to Admission medications   Medication Sig Start Date End Date Taking? Authorizing Provider  alendronate (FOSAMAX) 70 MG tablet Take 70 mg by mouth once a week. 01/02/21   [provider]  fluticasone (FLONASE) 50 MCG/ACT nasal spray Use 1-2 sprays per nostril once daily as needed for sinus congestion or allergy symptoms. 09/20/20   Jeralyn Bennett, MD  fluticasone (FLOVENT HFA) 110 MCG/ACT inhaler Inhale 2 puffs  into the lungs 2 (two) times daily. 09/20/20   Jeralyn Bennett, MD  imipramine (TOFRANIL) 25 MG tablet Take 1 tablet (25 mg total) by mouth at bedtime. 01/17/21   Sater, Nanine Means, MD  losartan (COZAAR) 25 MG tablet Take 1 tablet (25 mg total) by mouth daily. 08/10/20   Mosetta Anis, MD  montelukast (SINGULAIR) 10 MG tablet Take 10 mg by mouth every evening. 01/06/21   [provider]  pantoprazole (PROTONIX) 40 MG tablet Take 1 tablet (40 mg total) by mouth 2 (two) times daily before a meal. 01/23/21   Esterwood, Amy S, PA-C  sucralfate (CARAFATE) 1 GM/10ML suspension Take 1 gram between meals and at bedtime 01/23/21   Esterwood, Amy S, PA-C    Allergies    Banana, Gabapentin, Grape (artificial) flavor, Grape concord os [flavoring agent], Lamotrigine, Ibuprofen, and Penicillins  Review of Systems   Review of Systems  Musculoskeletal:  Positive for myalgias.  Skin:  Negative for wound.  All other systems reviewed and are negative.  Physical Exam Updated Vital Signs BP (!) 136/98 (BP Location: Left Arm)    Pulse (!) 110   Temp 98 F (36.7 C) (Oral)   Resp 18   LMP 08/21/1982   SpO2 98%   Physical Exam Vitals reviewed.  Cardiovascular:     Rate and Rhythm: Normal rate.     Pulses: Normal pulses.  Pulmonary:     Effort: Pulmonary effort is normal.  Musculoskeletal:        General: No swelling or tenderness.     Comments: Pain with range of motion left leg,  no wounds.    Skin:    General: Skin is warm.  Neurological:     General: No focal deficit present.     Mental Status: She is alert.  Psychiatric:        Mood and Affect: Mood normal.    ED Results / Procedures / Treatments   Labs (all labs ordered are listed, but only abnormal results are displayed) Labs Reviewed - No data to display  EKG None  Radiology No results found.  Procedures Procedures   Medications Ordered in ED Medications - No data to display  ED Course  I have reviewed the triage vital signs and the nursing notes.  Pertinent labs & imaging results that were available during my care of the patient were reviewed by me and considered in my medical decision making (see chart for details).    MDM Rules/Calculators/A&P                           MDM:  Odette Horns shows a foreign body.  (Possible sewing needle)  I reexamine pt,  no sign of wound   I spoke with Orion Crook PA.  I will refer to Ortho.  I am not sure if pain is due to arthritis, new impace or foreign body.  Pt given rx for tramadol for pain Final Clinical Impression(s) / ED Diagnoses Final diagnoses:  Left leg pain  Foreign body of leg, left, initial encounter    Rx / DC Orders ED Discharge Orders     None     An After Visit Summary was printed and given to the patient.    Fransico Meadow, PA-C 01/27/21 1349    Wynona Dove A, DO 01/27/21 1726

## 2021-01-27 NOTE — ED Triage Notes (Signed)
Per EMS- patient c/o left arm  and left leg pain x 1 month. Patient is from home/

## 2021-01-29 ENCOUNTER — Ambulatory Visit (HOSPITAL_COMMUNITY)
Admission: EM | Admit: 2021-01-29 | Discharge: 2021-01-29 | Disposition: A | Discharge status: Home / Self Care | Payer: Medicare Other

## 2021-01-29 ENCOUNTER — Encounter (HOSPITAL_COMMUNITY): Payer: Self-pay | Admitting: *Deleted

## 2021-01-29 DIAGNOSIS — K449 Diaphragmatic hernia without obstruction or gangrene: Secondary | ICD-10-CM

## 2021-01-29 HISTORY — DX: Diaphragmatic hernia without obstruction or gangrene: K44.9

## 2021-01-29 NOTE — ED Triage Notes (Addendum)
Pt states she has "thyroid pain"; when questioned, reports she has throat/neck pain with painful swallowing -- states she is scheduled for hiatal hernia surgery.  Presents to [facility] for "something for pain" so I can swallow better.  Pt talking without difficulty.

## 2021-01-29 NOTE — Discharge Instructions (Addendum)
Recommend she take SUCRALFATE- She should have prescription of this at home and if not should be ready for pick up at pharmacy (last prescribed 5 day ago). Continue current treatment with protonix. Follow up with GI as scheduled.

## 2021-01-29 NOTE — ED Provider Notes (Signed)
[place]    CSN: [id number] Arrival date & time: 01/29/21  1244      History   Chief Complaint Chief Complaint  Patient presents with   Neck Pain    HPI Kimberly [name] is a 65 y.o. female.   Patient presents today for evaluation of pain with swallowing in her epigastric region.  She has known hiatal hernia.  She is due to have an EGD in 2 days.  She reports she would like something to treat her symptoms until she is able to get her EGD.  She has not been able to take the tramadol she was prescribed in the ED due to nausea.  Limited and other treatment options due to reactions.  She does admit to taking her Protonix daily as prescribed but does not report taking any other treatments for her reflux.   Neck Pain Associated symptoms: no fever    Past Medical History:  Diagnosis Date   Alcohol abuse, in remission    since around 1950   Alcoholic hepatitis    fatty liver on imaging- Treated Hepatits C and treated   Allergic rhinitis    Anemia    EGD with duodenal AVM, normal colonoscopy 04/2012   Asthma    Callus of foot 2009   Cancer [facility])    Chest pain, musculoskeletal 2011   Cholelithiasis    [place] 09/2008: Cholelithiasis is present, s/p cholecystectomy   Depression    Excessive cerumen in ear canal    since before 2000   GERD (gastroesophageal reflux disease)    Hiatal hernia    History of blood transfusion    Hyperlipidemia    Hypertension    Menopausal syndrome    Treated with estradiol in the past - discontinued 04/2012   MENOPAUSAL SYNDROME 09/24/2006   2008 Note : The pt is adament about having this medication.  She fully understands the increased risk of heart disease and cancer that is associated with HRT and is willing to accept this risk in order to prevent the debilitating hot flashes she has off this medication.  Will continue to encourage her to try to wean herself off of this medication at our next visit.  2009 note indicated that she tr    Otitis externa, acute 06/2010   bilateral, treated with azithromycin after failure of neomycin drops   Otitis media, acute 06/2010   PONV (postoperative nausea and vomiting)     Patient Active Problem List   Diagnosis Date Noted   Left arm pain 01/17/2021   Left leg pain 01/17/2021   Numbness 11/16/2020   Facial pain 11/16/2020   Osteoarthritis of cervical spine 11/16/2020   Left-sided chest wall pain 07/12/2020   Chest wall muscle strain 07/12/2020   Malignant neoplasm of bronchus of right upper lobe ([place]) 05/12/2020   Mass of upper lobe of right lung 05/03/2020   COVID-19 09/25/2019   History of dysuria 09/24/2019   Rhinosinusitis 08/27/2019   Dysuria 05/25/2019   Tachycardia 05/25/2019   Hematuria 05/25/2019   AVM (arteriovenous malformation) of small bowel, 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   Health care maintenance 03/25/2013   Depression 12/03/2012   Iron deficiency anemia 05/08/2012   Insomnia 05/07/2012   Weight loss 07/27/2009   HLD (hyperlipidemia) 03/25/2008   Constipation 11/04/2007   Essential hypertension 09/24/2006   Allergies 09/24/2006   Asthma 09/24/2006   GERD 09/24/2006    Past Surgical History:  Procedure Laterality Date   ABDOMINAL HYSTERECTOMY  1980s   BIOPSY  03/18/2019   Procedure: BIOPSY;  Surgeon: Irving Copas., MD;  Location: [place];  Service: Gastroenterology;;   BRONCHIAL BIOPSY  05/03/2020   Procedure: BRONCHIAL BIOPSIES;  Surgeon: Collene Gobble, MD;  Location: [facility] ENDOSCOPY;  Service: Pulmonary;;   BRONCHIAL BRUSHINGS  05/03/2020   Procedure: BRONCHIAL BRUSHINGS;  Surgeon: Collene Gobble, MD;  Location: [facility] ENDOSCOPY;  Service: Pulmonary;;   BRONCHIAL NEEDLE ASPIRATION BIOPSY  05/03/2020   Procedure: BRONCHIAL NEEDLE ASPIRATION BIOPSIES;  Surgeon: Collene Gobble, MD;  Location: [facility] ENDOSCOPY;  Service: Pulmonary;;   CHOLECYSTECTOMY  01/06/09   COLONOSCOPY  05/09/2012   Procedure: COLONOSCOPY;  Surgeon: Inda Castle, MD;  Location: [place];  Service: Endoscopy;  Laterality: N/A;   COLONOSCOPY WITH PROPOFOL N/A 03/18/2019   Procedure: COLONOSCOPY WITH PROPOFOL;  Surgeon: Rush Landmark Telford Nab., MD;  Location: [place];  Service: Gastroenterology;  Laterality: N/A;   ENTEROSCOPY N/A 03/18/2019   Procedure: ENTEROSCOPY;  Surgeon: Rush Landmark Telford Nab., MD;  Location: [place];  Service: Gastroenterology;  Laterality: N/A;   ESOPHAGOGASTRODUODENOSCOPY  05/09/2012   Procedure: ESOPHAGOGASTRODUODENOSCOPY (EGD);  Surgeon: Inda Castle, MD;  Location: [place] ;  Service: Endoscopy;  Laterality: N/A;   FIDUCIAL MARKER PLACEMENT  05/03/2020   Procedure: FIDUCIAL MARKER PLACEMENT;  Surgeon: Collene Gobble, MD;  Location: [facility] ENDOSCOPY;  Service: Pulmonary;;   HEMOSTASIS CLIP PLACEMENT  03/18/2019   Procedure: HEMOSTASIS CLIP PLACEMENT;  Surgeon: Irving Copas., MD;  Location: [place];  Service: Gastroenterology;;   HOT HEMOSTASIS N/A 03/18/2019   Procedure: HOT HEMOSTASIS (ARGON PLASMA COAGULATION/BICAP);  Surgeon: Irving Copas., MD;  Location: [place];  Service: Gastroenterology;  Laterality: N/A;   POLYPECTOMY  03/18/2019   Procedure: POLYPECTOMY;  Surgeon: Rush Landmark Telford Nab., MD;  Location: [place];  Service: Gastroenterology;;   SUBMUCOSAL TATTOO INJECTION  03/18/2019   Procedure: SUBMUCOSAL TATTOO INJECTION;  Surgeon: Irving Copas., MD;  Location: [place];  Service: Gastroenterology;;   VIDEO BRONCHOSCOPY WITH ENDOBRONCHIAL NAVIGATION N/A 05/03/2020   Procedure: VIDEO BRONCHOSCOPY WITH ENDOBRONCHIAL NAVIGATION;  Surgeon: Collene Gobble, MD;  Location: [place] ENDOSCOPY;  Service: Pulmonary;  Laterality: N/A;    OB History   No obstetric history on file.      Home Medications    Prior to Admission medications   Medication Sig Start Date End Date Taking? Authorizing Provider  fluticasone (FLONASE) 50 MCG/ACT nasal spray Use 1-2 sprays per nostril once  daily as needed for sinus congestion or allergy symptoms. 09/20/20  Yes Jeralyn Bennett, MD  fluticasone (FLOVENT HFA) 110 MCG/ACT inhaler Inhale 2 puffs into the lungs 2 (two) times daily. 09/20/20  Yes Jeralyn Bennett, MD  pantoprazole (PROTONIX) 40 MG tablet Take 1 tablet (40 mg total) by mouth 2 (two) times daily before a meal. 01/23/21  Yes Esterwood, Amy S, PA-C  sucralfate (CARAFATE) 1 GM/10ML suspension Take 1 gram between meals and at bedtime 01/23/21  Yes Esterwood, Amy S, PA-C  alendronate (FOSAMAX) 70 MG tablet Take 70 mg by mouth once a week. 01/02/21   [provider]  imipramine (TOFRANIL) 25 MG tablet Take 1 tablet (25 mg total) by mouth at bedtime. 01/17/21   Sater, Nanine Means, MD  losartan (COZAAR) 25 MG tablet Take 1 tablet (25 mg total) by mouth daily. 08/10/20   Mosetta Anis, MD  montelukast (SINGULAIR) 10 MG tablet Take 10 mg by mouth every evening. 01/06/21   [provider]  traMADol (ULTRAM) 50 MG tablet Take 1 tablet (50  mg total) by mouth every 6 (six) hours as needed. 01/27/21 01/27/22  Fransico Meadow, PA-C    Family History Family History  Problem Relation Age of Onset   Hypertension Mother    Colon cancer Neg Hx    Esophageal cancer Neg Hx    Inflammatory bowel disease Neg Hx    Liver disease Neg Hx    Pancreatic cancer Neg Hx    Rectal cancer Neg Hx    Stomach cancer Neg Hx     Social History Social History   Tobacco Use   Smoking status: Former    Types: Cigarettes    Quit date: 08/21/1995    Years since quitting: 25.4   Smokeless tobacco: Never   Tobacco comments:    Patient states she stopped smoking in 31 yrs ago (as of 2022)  Vaping Use   Vaping Use: Never used  Substance Use Topics   Alcohol use: No    Comment: in remission since 2007   Drug use: Not Currently    Comment: previously used marijuana      Allergies   Banana, Gabapentin, Grape (artificial) flavor, Grape concord os [flavoring agent], Lamotrigine, Ibuprofen, and  Penicillins   Review of Systems Review of Systems  Constitutional:  Negative for chills and fever.  HENT:  Negative for congestion and sore throat.   Eyes:  Negative for discharge and redness.  Gastrointestinal:  Positive for abdominal pain. Negative for nausea and vomiting.    Physical Exam Triage Vital Signs ED Triage Vitals  Enc Vitals Group     BP 01/29/21 1414 132/85     Pulse Rate 01/29/21 1414 91     Resp 01/29/21 1414 16     Temp 01/29/21 1414 98 F (36.7 C)     Temp Source 01/29/21 1414 Temporal     SpO2 01/29/21 1414 95 %     Weight --      Height --      Head Circumference --      Peak Flow --      Pain Score 01/29/21 1416 10     Pain Loc --      Pain Edu? --      Excl. in [place]? --    No data found.  Updated Vital Signs BP 132/85   Pulse 91   Temp 98 F (36.7 C) (Temporal)   Resp 16   LMP 08/21/1982   SpO2 95%     Physical Exam Vitals and nursing note reviewed.  Constitutional:      General: She is not in acute distress.    Appearance: Normal appearance. She is not ill-appearing.  HENT:     Head: Normocephalic and atraumatic.     Nose: Nose normal.     Mouth/Throat:     Mouth: Mucous membranes are moist.     Pharynx: Oropharynx is clear. No oropharyngeal exudate or posterior oropharyngeal erythema.  Cardiovascular:     Rate and Rhythm: Normal rate and regular rhythm.     Heart sounds: Normal heart sounds. No murmur heard. Pulmonary:     Effort: Pulmonary effort is normal. No respiratory distress.     Breath sounds: Normal breath sounds. No wheezing, rhonchi or rales.  Skin:    General: Skin is warm and dry.  Neurological:     Mental Status: She is alert.  Psychiatric:        Mood and Affect: Mood normal.        Thought Content: Thought content  normal.     UC Treatments / Results  Labs (all labs ordered are listed, but only abnormal results are displayed) Labs Reviewed - No data to display  EKG   Radiology No results  found.  Procedures Procedures (including critical care time)  Medications Ordered in UC Medications - No data to display  Initial Impression / Assessment and Plan / UC Course  I have reviewed the triage vital signs and the nursing notes.  Pertinent labs & imaging results that were available during my care of the patient were reviewed by me and considered in my medical decision making (see chart for details).  Recommended patient start taking Carafate that she was prescribed about 5 days ago.  It would seem she has not been taking this medication.  Final Clinical Impressions(s) / UC Diagnoses   Final diagnoses:  Hiatal hernia     Discharge Instructions      Recommend she take SUCRALFATE- She should have prescription of this at home and if not should be ready for pick up at pharmacy (last prescribed 5 day ago). Continue current treatment with protonix. Follow up with GI as scheduled.      ED Prescriptions   None    PDMP not reviewed this encounter.   Francene Finders, PA-C 01/29/21 1507

## 2021-01-31 ENCOUNTER — Telehealth: Payer: Self-pay | Admitting: Gastroenterology

## 2021-01-31 ENCOUNTER — Ambulatory Visit (AMBULATORY_SURGERY_CENTER): Payer: Medicare Other | Admitting: Gastroenterology

## 2021-01-31 ENCOUNTER — Other Ambulatory Visit: Payer: Self-pay

## 2021-01-31 ENCOUNTER — Encounter: Payer: Self-pay | Admitting: Gastroenterology

## 2021-01-31 VITALS — BP 124/77 | HR 97 | Temp 97.3°F | Resp 18 | Ht 64.0 in | Wt 96.0 lb

## 2021-01-31 DIAGNOSIS — K2970 Gastritis, unspecified, without bleeding: Secondary | ICD-10-CM | POA: Diagnosis not present

## 2021-01-31 DIAGNOSIS — K2950 Unspecified chronic gastritis without bleeding: Secondary | ICD-10-CM

## 2021-01-31 DIAGNOSIS — K222 Esophageal obstruction: Secondary | ICD-10-CM

## 2021-01-31 DIAGNOSIS — R1310 Dysphagia, unspecified: Secondary | ICD-10-CM | POA: Diagnosis not present

## 2021-01-31 DIAGNOSIS — D500 Iron deficiency anemia secondary to blood loss (chronic): Secondary | ICD-10-CM | POA: Diagnosis not present

## 2021-01-31 DIAGNOSIS — K449 Diaphragmatic hernia without obstruction or gangrene: Secondary | ICD-10-CM | POA: Diagnosis not present

## 2021-01-31 DIAGNOSIS — K208 Other esophagitis without bleeding: Secondary | ICD-10-CM | POA: Diagnosis not present

## 2021-01-31 MED ORDER — SODIUM CHLORIDE 0.9 % IV SOLN: 500.0000 mL | INTRAVENOUS | Status: DC

## 2021-01-31 NOTE — Op Note (Signed)
[place] Patient Name: Kimberly [name] Procedure Date: 01/31/2021 8:27 AM MRN: [id number] Endoscopist: Justice Britain , MD Age: 65 Referring MD:  Date of Birth: [birth year] Gender: Female Account #: [id number] Procedure:                Upper GI endoscopy Indications:              Dysphagia, Heartburn, Weight loss Medicines:                Monitored Anesthesia Care Procedure:                Pre-Anesthesia Assessment:                           - Prior to the procedure, a History and Physical                            was performed, and patient medications and                            allergies were reviewed. The patient's tolerance of                            previous anesthesia was also reviewed. The risks                            and benefits of the procedure and the sedation                            options and risks were discussed with the patient.                            All questions were answered, and informed consent                            was obtained. Prior Anticoagulants: The patient has                            taken no previous anticoagulant or antiplatelet                            agents. ASA Grade Assessment: III - A patient with                            severe systemic disease. After reviewing the risks                            and benefits, the patient was deemed in                            satisfactory condition to undergo the procedure.                           After obtaining informed consent, the endoscope was  passed under direct vision. Throughout the                            procedure, the patient's blood pressure, pulse, and                            oxygen saturations were monitored continuously. The                            Endoscope was introduced through the mouth, and                            advanced to the second part of duodenum. The upper                            GI endoscopy was  accomplished without difficulty.                            The patient tolerated the procedure. Scope In: Scope Out: Findings:                 No gross lesions were noted in the entire                            esophagus. Biopsies were taken with a cold forceps                            for histology to rule out EoE/LoE.                           A widely patent Schatzki ring was found at the                            gastroesophageal junction. Biopsies were taken to                            disrupt this Schatzki ring with a cold forceps.                           After the rest of the EGD was complete, a guidewire                            was placed and the scope was withdrawn. Dilation                            was performed in the esophagus with a Savary                            dilator with no resistance at 17 mm and mild                            resistance at 18 mm. The dilation site was examined  following endoscope reinsertion and showed moderate                            mucosal disruption and moderate improvement just                            below the UES in luminal narrowing and no                            perforation throughout.                           A 3 cm hiatal hernia was found. The proximal extent                            of the gastric folds (end of tubular esophagus) was                            35 cm from the incisors. The hiatal narrowing was                            38 cm from the incisors. The Z-line was 35 cm from                            the incisors.                           Segmental moderate inflammation characterized by                            erythema and granularity was found in the gastric                            antrum.                           No other gross lesions were noted in the entire                            examined stomach. Biopsies were taken with a cold                             forceps for histology and Helicobacter pylori                            testing.                           No gross lesions were noted in the duodenal bulb,                            in the first portion of the duodenum and in the  second portion of the duodenum. Biopsies were taken                            with a cold forceps for histology. Complications:            No immediate complications. Estimated Blood Loss:     Estimated blood loss was minimal. Impression:               - No gross lesions in esophagus. Biopsied.                           - Widely patent Schatzki ring. Disrupted.                           - Dilation performed in the esophagus. Mucosal                            wrent noted just below the UES.                           - 3 cm hiatal hernia.                           - Gastritis in antrum. No other gross lesions in                            the stomach. Biopsied.                           - No gross lesions in the duodenal bulb, in the                            first portion of the duodenum and in the second                            portion of the duodenum. Biopsied. Recommendation:           - The patient will be observed post-procedure,                            until all discharge criteria are met.                           - Discharge patient to home.                           - Patient has a contact number available for                            emergencies. The signs and symptoms of potential                            delayed complications were discussed with the                            patient. Return  to normal activities tomorrow.                            Written discharge instructions were provided to the                            patient.                           - Dilation diet as per protocol.                           - Continue present medications.                           - Await pathology results.                            - Repeat upper endoscopy PRN for retreatment if                            longer standing improvement of dypshagia.                           - If after 1-2 weeks, patient has not had clinical                            improvement with dysphagia, then she will need to                            undergo an esophageal manometry as well as SLP                            Modified Barium Swallow.                           - If weight loss persists, then would consider role                            of CT-Abdomen to rule out other etiology of issues                            (has had recent CT-Chest within the last few months                            of 2022).                           - The findings and recommendations were discussed                            with the patient. Justice Britain, MD 01/31/2021 9:05:46 AM

## 2021-01-31 NOTE — Progress Notes (Signed)
GASTROENTEROLOGY PROCEDURE H&P NOTE   Primary Care Physician: Arthur Holms, NP  HPI: Kimberly [name] is a 65 y.o. female who presents for EGD for follow up GERD and Odynophagia and Unintentional Weight loss and possible dilation.  Past Medical History:  Diagnosis Date   Alcohol abuse, in remission    since around 7517   Alcoholic hepatitis    fatty liver on imaging- Treated Hepatits C and treated   Allergic rhinitis    Anemia    EGD with duodenal AVM, normal colonoscopy 04/2012   Asthma    Callus of foot 2009   Cancer [facility])    Chest pain, musculoskeletal 2011   Cholelithiasis    [place] 09/2008: Cholelithiasis is present, s/p cholecystectomy   Depression    Excessive cerumen in ear canal    since before 2000   GERD (gastroesophageal reflux disease)    Hiatal hernia    History of blood transfusion    Hyperlipidemia    Hypertension    Menopausal syndrome    Treated with estradiol in the past - discontinued 04/2012   MENOPAUSAL SYNDROME 09/24/2006   2008 Note : The pt is adament about having this medication.  She fully understands the increased risk of heart disease and cancer that is associated with HRT and is willing to accept this risk in order to prevent the debilitating hot flashes she has off this medication.  Will continue to encourage her to try to wean herself off of this medication at our next visit.  2009 note indicated that she tr   Otitis externa, acute 06/2010   bilateral, treated with azithromycin after failure of neomycin drops   Otitis media, acute 06/2010   PONV (postoperative nausea and vomiting)    Past Surgical History:  Procedure Laterality Date   ABDOMINAL HYSTERECTOMY  1980s   BIOPSY  03/18/2019   Procedure: BIOPSY;  Surgeon: Irving Copas., MD;  Location: [place];  Service: Gastroenterology;;   BRONCHIAL BIOPSY  05/03/2020   Procedure: BRONCHIAL BIOPSIES;  Surgeon: Collene Gobble, MD;  Location[facility] ENDOSCOPY;  Service: Pulmonary;;    BRONCHIAL BRUSHINGS  05/03/2020   Procedure: BRONCHIAL BRUSHINGS;  Surgeon: Collene Gobble, MD;  Location: [facility] ENDOSCOPY;  Service: Pulmonary;;   BRONCHIAL NEEDLE ASPIRATION BIOPSY  05/03/2020   Procedure: BRONCHIAL NEEDLE ASPIRATION BIOPSIES;  Surgeon: Collene Gobble, MD;  Location: [facility] ENDOSCOPY;  Service: Pulmonary;;   CHOLECYSTECTOMY  01/06/09   COLONOSCOPY  05/09/2012   Procedure: COLONOSCOPY;  Surgeon: Inda Castle, MD;  Location: [facility] ENDOSCOPY;  Service: Endoscopy;  Laterality: N/A;   COLONOSCOPY WITH PROPOFOL N/A 03/18/2019   Procedure: COLONOSCOPY WITH PROPOFOL;  Surgeon: Rush Landmark Telford Nab., MD;  Location: [place];  Service: Gastroenterology;  Laterality: N/A;   ENTEROSCOPY N/A 03/18/2019   Procedure: ENTEROSCOPY;  Surgeon: Rush Landmark Telford Nab., MD;  Location: [place];  Service: Gastroenterology;  Laterality: N/A;   ESOPHAGOGASTRODUODENOSCOPY  05/09/2012   Procedure: ESOPHAGOGASTRODUODENOSCOPY (EGD);  Surgeon: Inda Castle, MD;  Location: [place];  Service: Endoscopy;  Laterality: N/A;   FIDUCIAL MARKER PLACEMENT  05/03/2020   Procedure: FIDUCIAL MARKER PLACEMENT;  Surgeon: Collene Gobble, MD;  Location: [facility] ENDOSCOPY;  Service: Pulmonary;;   HEMOSTASIS CLIP PLACEMENT  03/18/2019   Procedure: HEMOSTASIS CLIP PLACEMENT;  Surgeon: Irving Copas., MD;  Location: [place];  Service: Gastroenterology;;   HOT HEMOSTASIS N/A 03/18/2019   Procedure: HOT HEMOSTASIS (ARGON PLASMA COAGULATION/BICAP);  Surgeon: Rush Landmark Telford Nab., MD;  Location: [place];  Service: Gastroenterology;  Laterality: N/A;   POLYPECTOMY  03/18/2019   Procedure: POLYPECTOMY;  Surgeon: Rush Landmark Telford Nab., MD;  Location: [place];  Service: Gastroenterology;;   SUBMUCOSAL TATTOO INJECTION  03/18/2019   Procedure: SUBMUCOSAL TATTOO INJECTION;  Surgeon: Irving Copas., MD;  Location: [place];  Service: Gastroenterology;;   VIDEO BRONCHOSCOPY WITH  ENDOBRONCHIAL NAVIGATION N/A 05/03/2020   Procedure: VIDEO BRONCHOSCOPY WITH ENDOBRONCHIAL NAVIGATION;  Surgeon: Collene Gobble, MD;  Location: [place] ENDOSCOPY;  Service: Pulmonary;  Laterality: N/A;   Current Outpatient Medications  Medication Sig Dispense Refill   alendronate (FOSAMAX) 70 MG tablet Take 70 mg by mouth once a week.     fluticasone (FLOVENT HFA) 110 MCG/ACT inhaler Inhale 2 puffs into the lungs 2 (two) times daily. 36 g 1   losartan (COZAAR) 25 MG tablet Take 1 tablet (25 mg total) by mouth daily. 90 tablet 3   pantoprazole (PROTONIX) 40 MG tablet Take 1 tablet (40 mg total) by mouth 2 (two) times daily before a meal. 60 tablet 3   fluticasone (FLONASE) 50 MCG/ACT nasal spray Use 1-2 sprays per nostril once daily as needed for sinus congestion or allergy symptoms. 16 g 0   montelukast (SINGULAIR) 10 MG tablet Take 10 mg by mouth every evening.     sucralfate (CARAFATE) 1 GM/10ML suspension Take 1 gram between meals and at bedtime 1200 mL 0   traMADol (ULTRAM) 50 MG tablet Take 1 tablet (50 mg total) by mouth every 6 (six) hours as needed. 20 tablet 0   Current Facility-Administered Medications  Medication Dose Route Frequency Provider Last Rate Last Admin   0.9 %  sodium chloride infusion  500 mL Intravenous Continuous Mansouraty, Telford Nab., MD        Current Outpatient Medications:    alendronate (FOSAMAX) 70 MG tablet, Take 70 mg by mouth once a week., Disp: , Rfl:    fluticasone (FLOVENT HFA) 110 MCG/ACT inhaler, Inhale 2 puffs into the lungs 2 (two) times daily., Disp: 36 g, Rfl: 1   losartan (COZAAR) 25 MG tablet, Take 1 tablet (25 mg total) by mouth daily., Disp: 90 tablet, Rfl: 3   pantoprazole (PROTONIX) 40 MG tablet, Take 1 tablet (40 mg total) by mouth 2 (two) times daily before a meal., Disp: 60 tablet, Rfl: 3   fluticasone (FLONASE) 50 MCG/ACT nasal spray, Use 1-2 sprays per nostril once daily as needed for sinus congestion or allergy symptoms., Disp: 16 g, Rfl:  0   montelukast (SINGULAIR) 10 MG tablet, Take 10 mg by mouth every evening., Disp: , Rfl:    sucralfate (CARAFATE) 1 GM/10ML suspension, Take 1 gram between meals and at bedtime, Disp: 1200 mL, Rfl: 0   traMADol (ULTRAM) 50 MG tablet, Take 1 tablet (50 mg total) by mouth every 6 (six) hours as needed., Disp: 20 tablet, Rfl: 0  Current Facility-Administered Medications:    0.9 %  sodium chloride infusion, 500 mL, Intravenous, Continuous, Mansouraty, Telford Nab., MD Allergies  Allergen Reactions   Banana     Lip swelling   Gabapentin Hives   Grape (Artificial) Flavor Swelling    Allergic to grapes   Lamotrigine Hives   Ibuprofen Rash   Penicillins Rash    Did it involve swelling of the face/tongue/throat, SOB, or low BP? No Did it involve sudden or severe rash/hives, skin peeling, or any reaction on the inside of your mouth or nose? No Did you need to seek medical attention at a hospital or doctor's office?  No When did it last happen?      more than 10 years If all above answers are "NO", may proceed with cephalosporin use.    Family History  Problem Relation Age of Onset   Hypertension Mother    Colon cancer Neg Hx    Esophageal cancer Neg Hx    Inflammatory bowel disease Neg Hx    Liver disease Neg Hx    Pancreatic cancer Neg Hx    Rectal cancer Neg Hx    Stomach cancer Neg Hx    Social History   Socioeconomic History   Marital status: Single    Spouse name: Not on file   Number of children: 2   Years of education: 12   Highest education level: Not on file  Occupational History   Not on file  Tobacco Use   Smoking status: Former    Types: Cigarettes    Quit date: 08/21/1995    Years since quitting: 25.4   Smokeless tobacco: Never   Tobacco comments:    Patient states she stopped smoking in 31 yrs ago (as of 2022)  Vaping Use   Vaping Use: Never used  Substance and Sexual Activity   Alcohol use: No    Comment: in remission since 2007   Drug use: Not Currently     Comment: previously used marijuana    Sexual activity: Not on file  Other Topics Concern   Not on file  Social History Narrative   Right handed   Caffeine use: none   Lives alone   Social Determinants of Health   Financial Resource Strain: Not on file  Food Insecurity: Not on file  Transportation Needs: Not on file  Physical Activity: Not on file  Stress: Not on file  Social Connections: Not on file  Intimate Partner Violence: Not on file    Physical Exam: Vital signs in last 24 hours: @VSRANGES @   GEN: NAD EYE: Sclerae anicteric ENT: MMM CV: Non-tachycardic GI: Soft, NT/ND NEURO:  Alert & Oriented x 3  Lab Results: No results for input(s): WBC, HGB, HCT, PLT in the last 72 hours. BMET No results for input(s): NA, K, CL, CO2, GLUCOSE, BUN, CREATININE, CALCIUM in the last 72 hours. LFT No results for input(s): PROT, ALBUMIN, AST, ALT, ALKPHOS, BILITOT, BILIDIR, IBILI in the last 72 hours. PT/INR No results for input(s): LABPROT, INR in the last 72 hours.   Impression / Plan: This is a 65 y.o.female who presents for EGD for follow up GERD and Odynophagia and Unintentional Weight loss and possible dilation.  The risks and benefits of endoscopic evaluation/treatment were discussed with the patient and/or family; these include but are not limited to the risk of perforation, infection, bleeding, missed lesions, lack of diagnosis, severe illness requiring hospitalization, as well as anesthesia and sedation related illnesses.  The patient's history has been reviewed, patient examined, no change in status, and deemed stable for procedure.  The patient and/or family is agreeable to proceed.    Justice Britain, MD [facility]oscopy Office # [phone]

## 2021-01-31 NOTE — Patient Instructions (Signed)
FOLLOW DILATATION DIET GIVEN TO YOU TODAY ,THEN RESUME USUAL DIET TOMORROW AS TOLERATED  HANDOUTS ON GASTRITIS AND HIATAL HERNIA GIVEN TO YOU TODAY  AWAIT BIOPSY RESULTS FROM DR [place]  LET DR [place] KNOW IF THROAT AND TROUBLE SWALLOWING NOT BETTER AFTER  2 WEEKS     YOU HAD AN ENDOSCOPIC PROCEDURE TODAY AT [place]:   Refer to the procedure report that was given to you for any specific questions about what was found during the examination.  If the procedure report does not answer your questions, please call your gastroenterologist to clarify.  If you requested that your care partner not be given the details of your procedure findings, then the procedure report has been included in a sealed envelope for you to review at your convenience later.  YOU SHOULD EXPECT: Some feelings of bloating in the abdomen. Passage of more gas than usual.  Walking can help get rid of the air that was put into your GI tract during the procedure and reduce the bloating. If you had a lower endoscopy (such as a colonoscopy or flexible sigmoidoscopy) you may notice spotting of blood in your stool or on the toilet paper. If you underwent a bowel prep for your procedure, you may not have a normal bowel movement for a few days.  Please Note:  You might notice some irritation and congestion in your nose or some drainage.  This is from the oxygen used during your procedure.  There is no need for concern and it should clear up in a day or so.  SYMPTOMS TO REPORT IMMEDIATELY:    Following upper endoscopy (EGD)  Vomiting of blood or coffee ground material  New chest pain or pain under the shoulder blades  Painful or persistently difficult swallowing  New shortness of breath  Fever of 100F or higher  Black, tarry-looking stools  For urgent or emergent issues, a gastroenterologist can be reached at any hour by calling [phone]. Do not use MyChart messaging for urgent concerns.     DIET:  We do recommend a small meal at first, but then you may proceed to your regular diet.  Drink plenty of fluids but you should avoid alcoholic beverages for 24 hours.  ACTIVITY:  You should plan to take it easy for the rest of today and you should NOT DRIVE or use heavy machinery until tomorrow (because of the sedation medicines used during the test).    FOLLOW UP: Our staff will call the number listed on your records 48-72 hours following your procedure to check on you and address any questions or concerns that you may have regarding the information given to you following your procedure. If we do not reach you, we will leave a message.  We will attempt to reach you two times.  During this call, we will ask if you have developed any symptoms of COVID 19. If you develop any symptoms (ie: fever, flu-like symptoms, shortness of breath, cough etc.) before then, please call [phone].  If you test positive for Covid 19 in the 2 weeks post procedure, please call and report this information to [place].    If any biopsies were taken you will be contacted by phone or by letter within the next 1-3 weeks.  Please call us at [phone] if you have not heard about the biopsies in 3 weeks.    SIGNATURES/CONFIDENTIALITY: You and/or your care partner have signed paperwork which will be entered into your electronic medical  record.  These signatures attest to the fact that that the information above on your After Visit Summary has been reviewed and is understood.  Full responsibility of the confidentiality of this discharge information lies with you and/or your care-partner.

## 2021-01-31 NOTE — Telephone Encounter (Signed)
After hours call: "sick face"  EGD with dilation performed to evaluate dysphagia by Dr. Rush Landmark this morning.  - No gross lesions in esophagus. Biopsied. - Widely patent Schatzki ring. Disrupted. - Dilation performed in the esophagus. Mucosal wrent noted just below the UES. - 3 cm hiatal hernia. - Gastritis in antrum. No other gross lesions in the stomach. Biopsied. - No gross lesions in the duodenal bulb, in the first portion of the duodenum and in the second portion of the duodenum. Biopsied.  She feels "sick in her face" since the procedure. Describes symptoms as similar to those that she experienced when positioning on her left side for her EGD earlier today. There may also be some left arm numbness. Unfortunately, I am unable to get her to describe how she feels beyond "sick." She is unable to distinguish if the feeling has been present since the procedure or if it is new tonight. No fevers, chills, neck pain, chest pain, pleuritic chest pain, nausea, vomiting, or abdominal pain. No history of symptoms like this prior to her endoscopy.   Given my difficulty understanding her symptoms and concerns for potential neurologic causes, I recommended that she go to the ER for further evaluation.

## 2021-01-31 NOTE — Progress Notes (Signed)
Pt in recovery with monitors in place, VSS. Report given to receiving RN. Bite guard was placed with pt awake to ensure comfort. No dental or soft tissue damage noted. RN will remove the guard when the pt is awake.  

## 2021-02-01 ENCOUNTER — Encounter (HOSPITAL_COMMUNITY): Payer: Self-pay | Admitting: Emergency Medicine

## 2021-02-01 ENCOUNTER — Ambulatory Visit (HOSPITAL_COMMUNITY)
Admission: EM | Admit: 2021-02-01 | Discharge: 2021-02-01 | Disposition: A | Discharge status: Home / Self Care | Payer: Medicare Other | Attending: Internal Medicine | Admitting: Internal Medicine

## 2021-02-01 DIAGNOSIS — K2289 Other specified disease of esophagus: Secondary | ICD-10-CM

## 2021-02-01 MED ORDER — LIDOCAINE VISCOUS HCL 2 % MT SOLN: 15.0000 mL | OROMUCOSAL | 0 refills | Status: DC | PRN

## 2021-02-01 NOTE — ED Triage Notes (Signed)
Pt reports had endoscopy yesterday to stretch her esophagus.pt c/o pain and states they didn't give her any medications for it. Also c/o SOB. Pt repots called on call MD who advised to go to ED or UC.

## 2021-02-01 NOTE — Telephone Encounter (Signed)
I spoke with the pt and she tells me that she is not having any dysphagia and feels better than she did last night. However, she changes that during the call to state she does have trouble swallowing and sore throat.  She states she is going to urgent care now.

## 2021-02-01 NOTE — Discharge Instructions (Addendum)
Some discomfort is to be expected after the procedure you had done yesterday.  However, should you experience any significant pain, shortness of breath or unusual bleeding you will need to go directly to the emergency department.  Otherwise you need to follow-up with the gastroenterologist.  You can use viscous lidocaine as needed for discomfort

## 2021-02-01 NOTE — ED Provider Notes (Signed)
[place]    CSN: [id number] Arrival date & time: 02/01/21  2585      History   Chief Complaint Chief Complaint  Patient presents with   Shortness of Breath   Sore Throat    HPI Kimberly [name] is a 65 y.o. female presents to urgent care today with complaints of pain with swallowing after EGD done yesterday.  EGD performed due to odynophagia and unintentional weight loss.  Patient found to have Schatzki ring requiring dilation as well as gastritis.  Patient reports difficulty swallowing and sore throat since procedure done yesterday.  She initially describes shortness of breath but upon further discussion she states it hurts her throat and chest to take deep breaths.  Patient denies any cough, chest pain, hemoptysis or hematemesis.    Past Medical History:  Diagnosis Date   Alcohol abuse, in remission    since around 2778   Alcoholic hepatitis    fatty liver on imaging- Treated Hepatits C and treated   Allergic rhinitis    Anemia    EGD with duodenal AVM, normal colonoscopy 04/2012   Asthma    Callus of foot 2009   Cancer [facility])    Chest pain, musculoskeletal 2011   Cholelithiasis    [place] 09/2008: Cholelithiasis is present, s/p cholecystectomy   Depression    Excessive cerumen in ear canal    since before 2000   GERD (gastroesophageal reflux disease)    Hiatal hernia    History of blood transfusion    Hyperlipidemia    Hypertension    Menopausal syndrome    Treated with estradiol in the past - discontinued 04/2012   MENOPAUSAL SYNDROME 09/24/2006   2008 Note : The pt is adament about having this medication.  She fully understands the increased risk of heart disease and cancer that is associated with HRT and is willing to accept this risk in order to prevent the debilitating hot flashes she has off this medication.  Will continue to encourage her to try to wean herself off of this medication at our next visit.  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 ([place]) 05/12/2020   Mass of upper lobe of right lung 05/03/2020   COVID-19 09/25/2019   History of dysuria 09/24/2019   Rhinosinusitis 08/27/2019   Dysuria 05/25/2019   Tachycardia 05/25/2019   Hematuria 05/25/2019   AVM (arteriovenous malformation) of small bowel, 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   Health care maintenance 03/25/2013   Depression 12/03/2012   Iron deficiency anemia 05/08/2012   Insomnia 05/07/2012   Weight loss 07/27/2009   HLD (hyperlipidemia) 03/25/2008   Constipation 11/04/2007   Essential hypertension 09/24/2006   Allergies 09/24/2006   Asthma 09/24/2006   GERD 09/24/2006    Past Surgical History:  Procedure Laterality Date   ABDOMINAL HYSTERECTOMY  1980s   BIOPSY  03/18/2019   Procedure: BIOPSY;  Surgeon: Irving Copas., MD;  Location: [facility] ENDOSCOPY;  Service: Gastroenterology;;  BRONCHIAL BIOPSY  05/03/2020   Procedure: BRONCHIAL BIOPSIES;  Surgeon: Collene Gobble, MD;  Location: [facility] ENDOSCOPY;  Service: Pulmonary;;   BRONCHIAL BRUSHINGS  05/03/2020   Procedure: BRONCHIAL BRUSHINGS;  Surgeon: Collene Gobble, MD;  Location: [facility] ENDOSCOPY;  Service: Pulmonary;;   BRONCHIAL NEEDLE ASPIRATION BIOPSY  05/03/2020   Procedure: BRONCHIAL NEEDLE ASPIRATION BIOPSIES;  Surgeon: Collene Gobble, MD;  Location: [facility] ENDOSCOPY;  Service: Pulmonary;;   CHOLECYSTECTOMY  01/06/09   COLONOSCOPY  05/09/2012   Procedure: COLONOSCOPY;  Surgeon: Inda Castle, MD;  Location: [place];   Service: Endoscopy;  Laterality: N/A;   COLONOSCOPY WITH PROPOFOL N/A 03/18/2019   Procedure: COLONOSCOPY WITH PROPOFOL;  Surgeon: Rush Landmark Telford Nab., MD;  Location: [place];  Service: Gastroenterology;  Laterality: N/A;   ENTEROSCOPY N/A 03/18/2019   Procedure: ENTEROSCOPY;  Surgeon: Rush Landmark Telford Nab., MD;  Location: [place];  Service: Gastroenterology;  Laterality: N/A;   ESOPHAGOGASTRODUODENOSCOPY  05/09/2012   Procedure: ESOPHAGOGASTRODUODENOSCOPY (EGD);  Surgeon: Inda Castle, MD;  Location: [place];  Service: Endoscopy;  Laterality: N/A;   FIDUCIAL MARKER PLACEMENT  05/03/2020   Procedure: FIDUCIAL MARKER PLACEMENT;  Surgeon: Collene Gobble, MD;  Location: [facility] ENDOSCOPY;  Service: Pulmonary;;   HEMOSTASIS CLIP PLACEMENT  03/18/2019   Procedure: HEMOSTASIS CLIP PLACEMENT;  Surgeon: Irving Copas., MD;  Location: [place];  Service: Gastroenterology;;   HOT HEMOSTASIS N/A 03/18/2019   Procedure: HOT HEMOSTASIS (ARGON PLASMA COAGULATION/BICAP);  Surgeon: Irving Copas., MD;  Location: [place];  Service: Gastroenterology;  Laterality: N/A;   POLYPECTOMY  03/18/2019   Procedure: POLYPECTOMY;  Surgeon: Rush Landmark Telford Nab., MD;  Location: [place];  Service: Gastroenterology;;   SUBMUCOSAL TATTOO INJECTION  03/18/2019   Procedure: SUBMUCOSAL TATTOO INJECTION;  Surgeon: Irving Copas., MD;  Location: [name];  Service: Gastroenterology;;   VIDEO BRONCHOSCOPY WITH ENDOBRONCHIAL NAVIGATION N/A 05/03/2020   Procedure: VIDEO BRONCHOSCOPY WITH ENDOBRONCHIAL NAVIGATION;  Surgeon: Collene Gobble, MD;  Location: [place] ENDOSCOPY;  Service: Pulmonary;  Laterality: N/A;    OB History   No obstetric history on file.      Home Medications    Prior to Admission medications   Medication Sig Start Date End Date Taking? Authorizing Provider  lidocaine (XYLOCAINE) 2 % solution Use as directed 15 mLs in the mouth or throat as needed  for mouth pain. 02/01/21  Yes Rudolpho Sevin, NP  alendronate (FOSAMAX) 70 MG tablet Take 70 mg by mouth once a week. 01/02/21   [provider]  fluticasone (FLONASE) 50 MCG/ACT nasal spray Use 1-2 sprays per nostril once daily as needed for sinus congestion or allergy symptoms. 09/20/20   Jeralyn Bennett, MD  fluticasone (FLOVENT HFA) 110 MCG/ACT inhaler Inhale 2 puffs into the lungs 2 (two) times daily. 09/20/20   Jeralyn Bennett, MD  losartan (COZAAR) 25 MG tablet Take 1 tablet (25 mg total) by mouth daily. 08/10/20   Mosetta Anis, MD  montelukast (SINGULAIR) 10 MG tablet Take 10 mg by mouth every evening. 01/06/21   [provider]  pantoprazole (PROTONIX) 40 MG tablet Take 1 tablet (40 mg total) by mouth 2 (two) times daily before a meal. 01/23/21   Esterwood, Amy S, PA-C  sucralfate (CARAFATE) 1 GM/10ML suspension Take 1 gram between meals and at bedtime 01/23/21   Esterwood, Amy S, PA-C  traMADol (ULTRAM) 50 MG tablet Take 1 tablet (50 mg total) by mouth every 6 (six) hours as needed. 01/27/21 01/27/22  Caryl Ada  K, PA-C    Family History Family History  Problem Relation Age of Onset   Hypertension Mother    Colon cancer Neg Hx    Esophageal cancer Neg Hx    Inflammatory bowel disease Neg Hx    Liver disease Neg Hx    Pancreatic cancer Neg Hx    Rectal cancer Neg Hx    Stomach cancer Neg Hx     Social History Social History   Tobacco Use   Smoking status: Former    Types: Cigarettes    Quit date: 08/21/1995    Years since quitting: 25.4   Smokeless tobacco: Never   Tobacco comments:    Patient states she stopped smoking in 31 yrs ago (as of 2022)  Vaping Use   Vaping Use: Never used  Substance Use Topics   Alcohol use: No    Comment: in remission since 2007   Drug use: Not Currently    Comment: previously used marijuana      Allergies   Banana, Gabapentin, Grape (artificial) flavor, Lamotrigine, Ibuprofen, and Penicillins   Review of Systems As  stated in HPI otherwise negative   Physical Exam Triage Vital Signs ED Triage Vitals  Enc Vitals Group     BP 02/01/21 1053 105/87     Pulse Rate 02/01/21 1053 86     Resp 02/01/21 1053 17     Temp 02/01/21 1053 97.8 F (36.6 C)     Temp Source 02/01/21 1053 Oral     SpO2 02/01/21 1053 100 %     Weight --      Height --      Head Circumference --      Peak Flow --      Pain Score 02/01/21 1055 8     Pain Loc --      Pain Edu? --      Excl. in [place]? --    No data found.  Updated Vital Signs BP 105/87 (BP Location: Right Arm)   Pulse 86   Temp 97.8 F (36.6 C) (Oral)   Resp 17   LMP 08/21/1982   SpO2 100%   Visual Acuity Right Eye Distance:   Left Eye Distance:   Bilateral Distance:    Right Eye Near:   Left Eye Near:    Bilateral Near:     Physical Exam Constitutional:      General: She is not in acute distress.    Appearance: She is not ill-appearing or toxic-appearing.     Comments: Quite thin, malnourished appearing  HENT:     Head: Normocephalic and atraumatic.     Mouth/Throat:     Mouth: Mucous membranes are moist.     Pharynx: Oropharynx is clear. No pharyngeal swelling or oropharyngeal exudate.  Neck:     Thyroid: No thyromegaly.     Trachea: No tracheal deviation.  Cardiovascular:     Rate and Rhythm: Normal rate and regular rhythm.  Pulmonary:     Effort: Pulmonary effort is normal.     Breath sounds: Normal breath sounds.  Abdominal:     General: Bowel sounds are normal.     Palpations: Abdomen is soft. There is no mass.     Tenderness: There is no abdominal tenderness. There is no guarding.  Musculoskeletal:     Cervical back: Normal range of motion and neck supple.  Lymphadenopathy:     Cervical: No cervical adenopathy.  Skin:    General: Skin is warm and dry.  Neurological:     General: No focal deficit present.     Mental Status: She is alert.  Psychiatric:        Mood and Affect: Mood normal.        Behavior: Behavior normal.      UC Treatments / Results  Labs (all labs ordered are listed, but only abnormal results are displayed) Labs Reviewed - No data to display  EKG   Radiology No results found.  Procedures Procedures (including critical care time)  Medications Ordered in UC Medications - No data to display  Initial Impression / Assessment and Plan / UC Course  I have reviewed the triage vital signs and the nursing notes.  Pertinent labs & imaging results that were available during my care of the patient were reviewed by me and considered in my medical decision making (see chart for details).   Sore throat Esophageal pain -S/p upper endoscopy yesterday with Schatzki ring requiring dilation and gastritis noted -Patient describes more sore throat and esophageal pain.  Exam normal.  No evidence of complication from procedure.  Findings felt normal given procedure -Explained to patient would expect throat soreness for several days postprocedure -She would like to try viscous lidocaine to help with symptoms vs watchful waiting -Discussed strict follow-up precautions.  Patient would need to go directly to ED should she develop any chest pain, shortness of breath, hemoptysis or hematemesis  Reviewed expections re: course of current medical issues. Questions answered. Outlined signs and symptoms indicating need for more acute intervention. Pt verbalized understanding. AVS given  Final Clinical Impressions(s) / UC Diagnoses   Final diagnoses:  Esophageal pain     Discharge Instructions      Some discomfort is to be expected after the procedure you had done yesterday.  However, should you experience any significant pain, shortness of breath or unusual bleeding you will need to go directly to the emergency department.  Otherwise you need to follow-up with the gastroenterologist.  You can use viscous lidocaine as needed for discomfort     ED Prescriptions     Medication Sig Dispense Auth.  Provider   lidocaine (XYLOCAINE) 2 % solution Use as directed 15 mLs in the mouth or throat as needed for mouth pain. 100 mL Rudolpho Sevin, NP      PDMP not reviewed this encounter.   Rudolpho Sevin, NP 02/01/21 1419

## 2021-02-01 NOTE — Telephone Encounter (Signed)
Patty, Thanks for update. I told her to expect sore throat for 72 hours. Then in 1-2 weeks we would know if there was going to be improvement s/p dilation. Agree with UC evaluation. Please check back in with her tomorrow. Thanks. GM

## 2021-02-01 NOTE — Telephone Encounter (Signed)
KLB, Thank you for the update on my patient.  In post procedure she was feeling relatively well.  Not sure what she is going through but certainly seems symptoms are out of proportion to what 1 would expect from an EGD with dilation. Agree with need for further evaluation likely in the emergency department. I do not see that she has gone to the ED as of yet. Patty, later this morning, please reach out to the patient and see how she is doing.  Hopefully better.  If symptoms remain present as such, she will need to go to the emergency department or urgent care. I will review the response when I come into clinic this afternoon. Thanks. GM

## 2021-02-02 ENCOUNTER — Telehealth: Payer: Self-pay

## 2021-02-02 ENCOUNTER — Telehealth: Payer: Self-pay | Admitting: Gastroenterology

## 2021-02-02 ENCOUNTER — Other Ambulatory Visit: Payer: Self-pay

## 2021-02-02 ENCOUNTER — Ambulatory Visit (INDEPENDENT_AMBULATORY_CARE_PROVIDER_SITE_OTHER)
Admission: RE | Admit: 2021-02-02 | Discharge: 2021-02-02 | Disposition: A | Discharge status: Home / Self Care | Payer: Medicare Other | Source: Ambulatory Visit | Attending: Gastroenterology | Admitting: Gastroenterology

## 2021-02-02 DIAGNOSIS — R070 Pain in throat: Secondary | ICD-10-CM

## 2021-02-02 NOTE — Telephone Encounter (Signed)
Pt states that her esophagus hurts after her EGD. Pls call her.

## 2021-02-02 NOTE — Telephone Encounter (Signed)
Patty, Thanks for update. She can come in for a CXR PA & Lateral + Neck X-ray, to ensure no other complications from procedure. We can review this. For now not much more to add at this time but continue lidocaine and cough drops and Chloraseptic sprays. Thanks. GM

## 2021-02-02 NOTE — Telephone Encounter (Signed)
The pt has been advised to come in for xrays today.  She will have someone bring her now. The orders have been entered.

## 2021-02-02 NOTE — Telephone Encounter (Signed)
  Follow up Call-  Call back number 01/31/2021  Post procedure Call Back phone  # [phone]  Permission to leave phone message No  Some recent data might be hidden     Patient questions:  Do you have a fever, pain , or abdominal swelling? No. Pain Score  0 *  Have you tolerated food without any problems? Yes.    Have you been able to return to your normal activities? Yes.    Do you have any questions about your discharge instructions: Diet   No. Medications  No. Follow up visit  No.  Do you have questions or concerns about your Care? No.  Actions: * If pain score is 4 or above: No action needed, pain <4.

## 2021-02-02 NOTE — Telephone Encounter (Signed)
Dr Rush Landmark I spoke with the pt and she states she still has a sore throat.  She went to the Urgent care and was given lidocaine to use as directed.  She has no dysphagia at this time.  Please advise of any further recommendations.

## 2021-02-03 ENCOUNTER — Other Ambulatory Visit: Payer: Self-pay

## 2021-02-03 ENCOUNTER — Ambulatory Visit (HOSPITAL_COMMUNITY)
Admission: EM | Admit: 2021-02-03 | Discharge: 2021-02-03 | Disposition: A | Discharge status: Home / Self Care | Payer: Medicare Other | Attending: Family Medicine | Admitting: Family Medicine

## 2021-02-03 ENCOUNTER — Encounter (HOSPITAL_COMMUNITY): Payer: Self-pay

## 2021-02-03 DIAGNOSIS — N3000 Acute cystitis without hematuria: Secondary | ICD-10-CM

## 2021-02-03 LAB — POCT URINALYSIS DIPSTICK, ED / UC
Glucose, UA: NEGATIVE mg/dL
Ketones, ur: 80 mg/dL — AB
Nitrite: NEGATIVE
Protein, ur: 30 mg/dL — AB
Specific Gravity, Urine: 1.025 (ref 1.005–1.030)
Urobilinogen, UA: 1 mg/dL (ref 0.0–1.0)
pH: 6 (ref 5.0–8.0)

## 2021-02-03 MED ORDER — NITROFURANTOIN MONOHYD MACRO 100 MG PO CAPS: 100.0000 mg | ORAL_CAPSULE | Freq: Two times a day (BID) | ORAL | 0 refills | Status: DC

## 2021-02-03 NOTE — ED Triage Notes (Signed)
Pt presents with vaginal itching & burning for past few weeks with no relief.

## 2021-02-03 NOTE — ED Provider Notes (Signed)
Kimberly [name]    CSN: [id number] Arrival date & time: 02/03/21  1554      History   Chief Complaint Chief Complaint  Patient presents with   UTI    HPI Kimberly [name] is a 65 y.o. female.   Pt complains of dysuria that started several days ago.  Reports scant blood in urine today.  She denies fever, chills, n/v/d.  She has had some bilateral flank pain.  Drinking fluids.    Past Medical History:  Diagnosis Date   Alcohol abuse, in remission    since around 4098   Alcoholic hepatitis    fatty liver on imaging- Treated Hepatits C and treated   Allergic rhinitis    Anemia    EGD with duodenal AVM, normal colonoscopy 04/2012   Asthma    Callus of foot 2009   Cancer [facility])    Chest pain, musculoskeletal 2011   Cholelithiasis    [place] 09/2008: Cholelithiasis is present, s/p cholecystectomy   Depression    Excessive cerumen in ear canal    since before 2000   GERD (gastroesophageal reflux disease)    Hiatal hernia    History of blood transfusion    Hyperlipidemia    Hypertension    Menopausal syndrome    Treated with estradiol in the past - discontinued 04/2012   MENOPAUSAL SYNDROME 09/24/2006   2008 Note : The pt is adament about having this medication.  She fully understands the increased risk of heart disease and cancer that is associated with HRT and is willing to accept this risk in order to prevent the debilitating hot flashes she has off this medication.  Will continue to encourage her to try to wean herself off of this medication at our next visit.  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 ([place]) 05/12/2020   Mass of upper lobe of right lung 05/03/2020   COVID-19 09/25/2019   History of dysuria 09/24/2019   Rhinosinusitis 08/27/2019   Dysuria 05/25/2019   Tachycardia 05/25/2019   Hematuria 05/25/2019   AVM (arteriovenous malformation) of small bowel, 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   Health care maintenance 03/25/2013   Depression 12/03/2012   Iron deficiency anemia 05/08/2012   Insomnia 05/07/2012   Weight loss 07/27/2009   HLD (hyperlipidemia) 03/25/2008   Constipation 11/04/2007   Essential hypertension 09/24/2006   Allergies 09/24/2006   Asthma 09/24/2006   GERD 09/24/2006    Past Surgical History:  Procedure Laterality Date   ABDOMINAL HYSTERECTOMY  1980s   BIOPSY  03/18/2019   Procedure: BIOPSY;  Surgeon: Irving Copas., MD;  Location: [place];  Service: Gastroenterology;;   BRONCHIAL BIOPSY  05/03/2020   Procedure: BRONCHIAL BIOPSIES;  Surgeon: Collene Gobble, MD;  Location: [facility] ENDOSCOPY;  Service: Pulmonary;;   BRONCHIAL BRUSHINGS  05/03/2020   Procedure: BRONCHIAL BRUSHINGS;  Surgeon: Collene Gobble, MD;  Location: [facility] ENDOSCOPY;  Service: Pulmonary;;   BRONCHIAL NEEDLE ASPIRATION BIOPSY  05/03/2020  Procedure: BRONCHIAL NEEDLE ASPIRATION BIOPSIES;  Surgeon: Collene Gobble, MD;  Location: [facility] ENDOSCOPY;  Service: Pulmonary;;   CHOLECYSTECTOMY  01/06/09   COLONOSCOPY  05/09/2012   Procedure: COLONOSCOPY;  Surgeon: Inda Castle, MD;  Location: [place];  Service: Endoscopy;  Laterality: N/A;   COLONOSCOPY WITH PROPOFOL N/A 03/18/2019   Procedure: COLONOSCOPY WITH PROPOFOL;  Surgeon: Rush Landmark Telford Nab., MD;  Location: [place];  Service: Gastroenterology;  Laterality: N/A;   ENTEROSCOPY N/A 03/18/2019   Procedure: ENTEROSCOPY;  Surgeon: Rush Landmark Telford Nab., MD;  Location: [place];  Service:  Gastroenterology;  Laterality: N/A;   ESOPHAGOGASTRODUODENOSCOPY  05/09/2012   Procedure: ESOPHAGOGASTRODUODENOSCOPY (EGD);  Surgeon: Inda Castle, MD;  Location: [place];  Service: Endoscopy;  Laterality: N/A;   FIDUCIAL MARKER PLACEMENT  05/03/2020   Procedure: FIDUCIAL MARKER PLACEMENT;  Surgeon: Collene Gobble, MD;  Location[facility] ENDOSCOPY;  Service: Pulmonary;;   HEMOSTASIS CLIP PLACEMENT  03/18/2019   Procedure: HEMOSTASIS CLIP PLACEMENT;  Surgeon: Irving Copas., MD;  Location: [place];  Service: Gastroenterology;;   HOT HEMOSTASIS N/A 03/18/2019   Procedure: HOT HEMOSTASIS (ARGON PLASMA COAGULATION/BICAP);  Surgeon: Irving Copas., MD;  Location: [place];  Service: Gastroenterology;  Laterality: N/A;   POLYPECTOMY  03/18/2019   Procedure: POLYPECTOMY;  Surgeon: Rush Landmark Telford Nab., MD;  Location: [place];  Service: Gastroenterology;;   SUBMUCOSAL TATTOO INJECTION  03/18/2019   Procedure: SUBMUCOSAL TATTOO INJECTION;  Surgeon: Irving Copas., MD;  Location: [place];  Service: Gastroenterology;;   VIDEO BRONCHOSCOPY WITH ENDOBRONCHIAL NAVIGATION N/A 05/03/2020   Procedure: VIDEO BRONCHOSCOPY WITH ENDOBRONCHIAL NAVIGATION;  Surgeon: Collene Gobble, MD;  Location: [place] ENDOSCOPY;  Service: Pulmonary;  Laterality: N/A;    OB History   No obstetric history on file.      Home Medications    Prior to Admission medications   Medication Sig Start Date End Date Taking? Authorizing Provider  nitrofurantoin, macrocrystal-monohydrate, (MACROBID) 100 MG capsule Take 1 capsule (100 mg total) by mouth 2 (two) times daily. 02/03/21  Yes Ward, Lenise Arena, PA-C  alendronate (FOSAMAX) 70 MG tablet Take 70 mg by mouth once a week. 01/02/21   [provider]  fluticasone (FLONASE) 50 MCG/ACT nasal spray Use 1-2 sprays per nostril once daily as needed for sinus congestion or allergy symptoms. 09/20/20   Jeralyn Bennett, MD  fluticasone  (FLOVENT HFA) 110 MCG/ACT inhaler Inhale 2 puffs into the lungs 2 (two) times daily. 09/20/20   Jeralyn Bennett, MD  lidocaine (XYLOCAINE) 2 % solution Use as directed 15 mLs in the mouth or throat as needed for mouth pain. 02/01/21   Rudolpho Sevin, NP  losartan (COZAAR) 25 MG tablet Take 1 tablet (25 mg total) by mouth daily. 08/10/20   Mosetta Anis, MD  montelukast (SINGULAIR) 10 MG tablet Take 10 mg by mouth every evening. 01/06/21   [provider]  pantoprazole (PROTONIX) 40 MG tablet Take 1 tablet (40 mg total) by mouth 2 (two) times daily before a meal. 01/23/21   Esterwood, Amy S, PA-C  sucralfate (CARAFATE) 1 GM/10ML suspension Take 1 gram between meals and at bedtime 01/23/21   Esterwood, Amy S, PA-C  traMADol (ULTRAM) 50 MG tablet Take 1 tablet (50 mg total) by mouth every 6 (six) hours as needed. 01/27/21 01/27/22  Fransico Meadow, PA-C    Family History Family History  Problem Relation Age of Onset   Hypertension Mother    Colon cancer Neg Hx    Esophageal  cancer Neg Hx    Inflammatory bowel disease Neg Hx    Liver disease Neg Hx    Pancreatic cancer Neg Hx    Rectal cancer Neg Hx    Stomach cancer Neg Hx     Social History Social History   Tobacco Use   Smoking status: Former    Types: Cigarettes    Quit date: 08/21/1995    Years since quitting: 25.4   Smokeless tobacco: Never   Tobacco comments:    Patient states she stopped smoking in 31 yrs ago (as of 2022)  Vaping Use   Vaping Use: Never used  Substance Use Topics   Alcohol use: No    Comment: in remission since 2007   Drug use: Not Currently    Comment: previously used marijuana      Allergies   Banana, Gabapentin, Grape (artificial) flavor, Lamotrigine, Ibuprofen, and Penicillins   Review of Systems Review of Systems  Constitutional:  Negative for chills and fever.  HENT:  Negative for ear pain and sore throat.   Eyes:  Negative for pain and visual disturbance.  Respiratory:  Negative for cough  and shortness of breath.   Cardiovascular:  Negative for chest pain and palpitations.  Gastrointestinal:  Negative for abdominal pain and vomiting.  Genitourinary:  Positive for dysuria, flank pain and urgency. Negative for difficulty urinating and hematuria.  Musculoskeletal:  Negative for arthralgias and back pain.  Skin:  Negative for color change and rash.  Neurological:  Negative for seizures and syncope.  All other systems reviewed and are negative.   Physical Exam Triage Vital Signs ED Triage Vitals  Enc Vitals Group     BP 02/03/21 1728 (!) 146/93     Pulse Rate 02/03/21 1728 83     Resp 02/03/21 1728 16     Temp 02/03/21 1728 98 F (36.7 C)     Temp Source 02/03/21 1728 Oral     SpO2 02/03/21 1728 100 %     Weight --      Height --      Head Circumference --      Peak Flow --      Pain Score 02/03/21 1727 5     Pain Loc --      Pain Edu? --      Excl. in [place]? --    No data found.  Updated Vital Signs BP (!) 146/93 (BP Location: Right Arm)   Pulse 83   Temp 98 F (36.7 C) (Oral)   Resp 16   LMP 08/21/1982   SpO2 100%   Visual Acuity Right Eye Distance:   Left Eye Distance:   Bilateral Distance:    Right Eye Near:   Left Eye Near:    Bilateral Near:     Physical Exam Vitals and nursing note reviewed.  Constitutional:      General: She is not in acute distress.    Appearance: She is well-developed.  HENT:     Head: Normocephalic and atraumatic.  Eyes:     Conjunctiva/sclera: Conjunctivae normal.  Cardiovascular:     Rate and Rhythm: Normal rate and regular rhythm.     Heart sounds: No murmur heard. Pulmonary:     Effort: Pulmonary effort is normal. No respiratory distress.     Breath sounds: Normal breath sounds.  Abdominal:     Palpations: Abdomen is soft.     Tenderness: There is no abdominal tenderness.  Musculoskeletal:     Cervical back: Neck  supple.  Skin:    General: Skin is warm and dry.  Neurological:     Mental Status: She is  alert.     UC Treatments / Results  Labs (all labs ordered are listed, but only abnormal results are displayed) Labs Reviewed  POCT URINALYSIS DIPSTICK, ED / UC - Abnormal; Notable for the following components:      Result Value   Bilirubin Urine LARGE (*)    Ketones, ur 80 (*)    Hgb urine dipstick TRACE (*)    Protein, ur 30 (*)    Leukocytes,Ua SMALL (*)    All other components within normal limits    EKG   Radiology DG Neck Soft Tissue  Result Date: 02/03/2021 CLINICAL DATA:  Neck and chest pain for 2 days following upper endoscopy EXAM: NECK SOFT TISSUES - 1+ VIEW COMPARISON:  None FINDINGS: Prevertebral soft tissues normal thickness. Epiglottis and aryepiglottic folds normal appearance. Airway appears patent. No soft tissue abnormalities identified. Mild facet degenerative changes. Minimal retrolisthesis L3-L4. IMPRESSION: No cervical soft tissue abnormalities identified. Electronically Signed   By: Lavonia Dana M.D.   On: 02/03/2021 12:41   DG Chest 2 View  Result Date: 02/03/2021 CLINICAL DATA:  Throat pain in upper chest pain for 2 days, upper endoscopy 2 days ago EXAM: CHEST - 2 VIEW COMPARISON:  12/14/2020 Correlation: CT chest 08/09/2020 FINDINGS: Normal heart size, mediastinal contours, and pulmonary vascularity. Atherosclerotic calcification aorta. Emphysematous and bronchitic changes consistent with COPD. Fiduciary markers in LEFT lower lobe. Linear subsegmental atelectasis versus scarring in RIGHT upper lobe. Two questionable areas of nodularity identified in RIGHT upper lobe, question pulmonary nodules; these were not well seen on the prior chest radiograph but are seen on an earlier CT of 08/09/2020. Lungs clear without infiltrate, pleural effusion, or pneumothorax. Bones appear demineralized. IMPRESSION: COPD changes with additional markers in the LEFT lower lobe. Nodular foci RIGHT lung, seen on an earlier CT exam of 08/09/2020. Aortic Atherosclerosis (ICD10-I70.0) and  Emphysema (ICD10-J43.9). Electronically Signed   By: Lavonia Dana M.D.   On: 02/03/2021 12:40    Procedures Procedures (including critical care time)  Medications Ordered in UC Medications - No data to display  Initial Impression / Assessment and Plan / UC Course  I have reviewed the triage vital signs and the nursing notes.  Pertinent labs & imaging results that were available during my care of the patient were reviewed by me and considered in my medical decision making (see chart for details).     UTI, macrobid prescribed.  Vitals WNL. Pt keeping fluids down, no n/v. Stable for discharge. Return precautions discussed.  Final Clinical Impressions(s) / UC Diagnoses   Final diagnoses:  Acute cystitis without hematuria     Discharge Instructions      Take medication as prescribed Drink plenty of water Return if symptoms worsen   ED Prescriptions     Medication Sig Dispense Auth. Provider   nitrofurantoin, macrocrystal-monohydrate, (MACROBID) 100 MG capsule Take 1 capsule (100 mg total) by mouth 2 (two) times daily. 10 capsule Ward, Lenise Arena, PA-C      PDMP not reviewed this encounter.   Ward, Lenise Arena, PA-C 02/03/21 1751

## 2021-02-03 NOTE — Discharge Instructions (Addendum)
Take medication as prescribed Drink plenty of water Return if symptoms worsen

## 2021-02-04 ENCOUNTER — Telehealth: Payer: Self-pay | Admitting: Nurse Practitioner

## 2021-02-04 NOTE — Telephone Encounter (Signed)
Dr. Rush Landmark I received a phone call from the [name] on Saturday from our on-call service.  The patient underwent an EGD with you 01/31/2021, see results below.  She continues to have the same symptoms she had at the time of her EGD.  Still has heartburn and dysphagia.  Her symptoms have not worsened but have not improved.  She called the answering service because she would like to have an earlier appointment with you than what is scheduled on 03/08/2021.  Refer to your  EGD procedure note, you mentioned if she has not noticed any improvement within 1 to 2 weeks that you would order an esophageal manometry as well as an SLP modified barium swallow and if weight loss continue to consider CTAP.  Please let Patty know if you wish to schedule these procedures and if you have an earlier availability to see the patient in office for follow-up or if you would like her to see me or another APP prior to her appointment with you on 03/08/2021.  Thank you  EGD 01/31/2021: No gross lesions in esophagus. Biopsied. - Widely patent Schatzki ring. Disrupted. - Dilation performed in the esophagus. Mucosal wrent noted just below the UES. - 3 cm hiatal hernia. - Gastritis in antrum. No other gross lesions in the stomach. Biopsied. - No gross lesions in the duodenal bulb, in the first portion of the duodenum and in the second portion of the duodenum. Biopsied.

## 2021-02-05 ENCOUNTER — Ambulatory Visit (INDEPENDENT_AMBULATORY_CARE_PROVIDER_SITE_OTHER): Payer: Medicare Other

## 2021-02-05 ENCOUNTER — Encounter (HOSPITAL_COMMUNITY): Payer: Self-pay

## 2021-02-05 ENCOUNTER — Other Ambulatory Visit: Payer: Self-pay

## 2021-02-05 ENCOUNTER — Ambulatory Visit (HOSPITAL_COMMUNITY)
Admission: EM | Admit: 2021-02-05 | Discharge: 2021-02-05 | Disposition: A | Discharge status: Home / Self Care | Payer: Medicare Other | Attending: Emergency Medicine | Admitting: Emergency Medicine

## 2021-02-05 DIAGNOSIS — M795 Residual foreign body in soft tissue: Secondary | ICD-10-CM

## 2021-02-05 DIAGNOSIS — M79652 Pain in left thigh: Secondary | ICD-10-CM | POA: Diagnosis not present

## 2021-02-05 MED ORDER — LIDOCAINE 5 % EX PTCH: 1.0000 | MEDICATED_PATCH | CUTANEOUS | 0 refills | Status: DC

## 2021-02-05 NOTE — ED Triage Notes (Signed)
Pt presents with a needle on her left leg. Pt states she was sewing a quilt on her leg 3 weeks ago and states the needle went in to her leg. States her left leg is swollen and sore.

## 2021-02-05 NOTE — Discharge Instructions (Addendum)
I have sent Dr. Lilia Pro a note asking him to see you sooner.  However, call first thing tomorrow morning to try and get an appointment ASAP.  In the meantime, try the Lidoderm patches.

## 2021-02-05 NOTE — Telephone Encounter (Signed)
CKS, Thanks for reaching out sorry she still having issues. Unfortunately the hospital doctor this week and will be away later in the month. Patty move forward with getting the patient an SLP evaluation. If [facility] has an earlier follow-up appointment available please try and see about getting her seen in the next 2 to 3 weeks. We will hold on the CT scan for now. Thanks. GM

## 2021-02-05 NOTE — ED Provider Notes (Signed)
HPI  SUBJECTIVE:  Kimberly [name] is a 65 y.o. female who presents with persistent daily, sharp, "sticking" medial left upper thigh pain, foreign body sensation.  She is not sure how long this has been present.  Patient was seen in the ED on 9/2 for this.  X-ray showed a 10 cm linear foreign body.  She was referred to Ortho and sent home with tramadol.  Patient states that orthopedics has placed a referral to general surgery 3 days ago.  She is not sure how long her symptoms have been present.  She reports localized erythema, swelling, no drainage.  States that she has felt feverish, hot and sweaty, but has not measured her temperature.  She tried an ice pack, heat.  She did not take the tramadol that was prescribed to her because it makes her sick.  There are no aggravating or alleviating factors.  She has a past medical history of lung cancer, hypertension, asthma.  Her tetanus is up-to-date.    CVE:LFYBOF, [name], NP   Past Medical History:  Diagnosis Date   Alcohol abuse, in remission    since around 7510   Alcoholic hepatitis    fatty liver on imaging- Treated Hepatits C and treated   Allergic rhinitis    Anemia    EGD with duodenal AVM, normal colonoscopy 04/2012   Asthma    Callus of foot 2009   Cancer [facility])    Chest pain, musculoskeletal 2011   Cholelithiasis    [place] 09/2008: Cholelithiasis is present, s/p cholecystectomy   Depression    Excessive cerumen in ear canal    since before 2000   GERD (gastroesophageal reflux disease)    Hiatal hernia    History of blood transfusion    Hyperlipidemia    Hypertension    Menopausal syndrome    Treated with estradiol in the past - discontinued 04/2012   MENOPAUSAL SYNDROME 09/24/2006   2008 Note : The pt is adament about having this medication.  She fully understands the increased risk of heart disease and cancer that is associated with HRT and is willing to accept this risk in order to prevent the debilitating hot flashes she has off this  medication.  Will continue to encourage her to try to wean herself off of this medication at our next visit.  2009 note indicated that she tr   Otitis externa, acute 06/2010   bilateral, treated with azithromycin after failure of neomycin drops   Otitis media, acute 06/2010   PONV (postoperative nausea and vomiting)     Past Surgical History:  Procedure Laterality Date   ABDOMINAL HYSTERECTOMY  1980s   BIOPSY  03/18/2019   Procedure: BIOPSY;  Surgeon: Irving Copas., MD;  Location: [place];  Service: Gastroenterology;;   BRONCHIAL BIOPSY  05/03/2020   Procedure: BRONCHIAL BIOPSIES;  Surgeon: Collene Gobble, MD;  Location: [facility] ENDOSCOPY;  Service: Pulmonary;;   BRONCHIAL BRUSHINGS  05/03/2020   Procedure: BRONCHIAL BRUSHINGS;  Surgeon: Collene Gobble, MD;  Location: [facility] ENDOSCOPY;  Service: Pulmonary;;   BRONCHIAL NEEDLE ASPIRATION BIOPSY  05/03/2020   Procedure: BRONCHIAL NEEDLE ASPIRATION BIOPSIES;  Surgeon: Collene Gobble, MD;  Location: [facility] ENDOSCOPY;  Service: Pulmonary;;   CHOLECYSTECTOMY  01/06/09   COLONOSCOPY  05/09/2012   Procedure: COLONOSCOPY;  Surgeon: Inda Castle, MD;  Location: [facility] ENDOSCOPY;  Service: Endoscopy;  Laterality: N/A;   COLONOSCOPY WITH PROPOFOL N/A 03/18/2019   Procedure: COLONOSCOPY WITH PROPOFOL;  Surgeon: Rush Landmark Telford Nab., MD;  Location: [facility] ENDOSCOPY;  Service: Gastroenterology;  Laterality: N/A;   ENTEROSCOPY N/A 03/18/2019   Procedure: ENTEROSCOPY;  Surgeon: Rush Landmark Telford Nab., MD;  Location: [place];  Service: Gastroenterology;  Laterality: N/A;   ESOPHAGOGASTRODUODENOSCOPY  05/09/2012   Procedure: ESOPHAGOGASTRODUODENOSCOPY (EGD);  Surgeon: Inda Castle, MD;  Location: [place];  Service: Endoscopy;  Laterality: N/A;   FIDUCIAL MARKER PLACEMENT  05/03/2020   Procedure: FIDUCIAL MARKER PLACEMENT;  Surgeon: Collene Gobble, MD;  Location: [facility] ENDOSCOPY;  Service: Pulmonary;;   HEMOSTASIS CLIP PLACEMENT  03/18/2019    Procedure: HEMOSTASIS CLIP PLACEMENT;  Surgeon: Irving Copas., MD;  Location: [place];  Service: Gastroenterology;;   HOT HEMOSTASIS N/A 03/18/2019   Procedure: HOT HEMOSTASIS (ARGON PLASMA COAGULATION/BICAP);  Surgeon: Irving Copas., MD;  Location: [place];  Service: Gastroenterology;  Laterality: N/A;   POLYPECTOMY  03/18/2019   Procedure: POLYPECTOMY;  Surgeon: Rush Landmark Telford Nab., MD;  Location: [place];  Service: Gastroenterology;;   SUBMUCOSAL TATTOO INJECTION  03/18/2019   Procedure: SUBMUCOSAL TATTOO INJECTION;  Surgeon: Irving Copas., MD;  Location: [place];  Service: Gastroenterology;;   VIDEO BRONCHOSCOPY WITH ENDOBRONCHIAL NAVIGATION N/A 05/03/2020   Procedure: VIDEO BRONCHOSCOPY WITH ENDOBRONCHIAL NAVIGATION;  Surgeon: Collene Gobble, MD;  Location: [place] ENDOSCOPY;  Service: Pulmonary;  Laterality: N/A;    Family History  Problem Relation Age of Onset   Hypertension Mother    Colon cancer Neg Hx    Esophageal cancer Neg Hx    Inflammatory bowel disease Neg Hx    Liver disease Neg Hx    Pancreatic cancer Neg Hx    Rectal cancer Neg Hx    Stomach cancer Neg Hx     Social History   Tobacco Use   Smoking status: Former    Types: Cigarettes    Quit date: 08/21/1995    Years since quitting: 25.4   Smokeless tobacco: Never   Tobacco comments:    Patient states she stopped smoking in 31 yrs ago (as of 2022)  Vaping Use   Vaping Use: Never used  Substance Use Topics   Alcohol use: No    Comment: in remission since 2007   Drug use: Not Currently    Comment: previously used marijuana     No current facility-administered medications for this encounter.  Current Outpatient Medications:    lidocaine (LIDODERM) 5 %, Place 1 patch onto the skin daily. Remove & Discard patch within 12 hours or as directed by MD, Disp: 14 patch, Rfl: 0   alendronate (FOSAMAX) 70 MG tablet, Take 70 mg by mouth once a week., Disp: , Rfl:     fluticasone (FLONASE) 50 MCG/ACT nasal spray, Use 1-2 sprays per nostril once daily as needed for sinus congestion or allergy symptoms., Disp: 16 g, Rfl: 0   fluticasone (FLOVENT HFA) 110 MCG/ACT inhaler, Inhale 2 puffs into the lungs 2 (two) times daily., Disp: 36 g, Rfl: 1   lidocaine (XYLOCAINE) 2 % solution, Use as directed 15 mLs in the mouth or throat as needed for mouth pain., Disp: 100 mL, Rfl: 0   losartan (COZAAR) 25 MG tablet, Take 1 tablet (25 mg total) by mouth daily., Disp: 90 tablet, Rfl: 3   montelukast (SINGULAIR) 10 MG tablet, Take 10 mg by mouth every evening., Disp: , Rfl:    nitrofurantoin, macrocrystal-monohydrate, (MACROBID) 100 MG capsule, Take 1 capsule (100 mg total) by mouth 2 (two) times daily., Disp: 10 capsule, Rfl: 0   pantoprazole (PROTONIX) 40 MG tablet,  Take 1 tablet (40 mg total) by mouth 2 (two) times daily before a meal., Disp: 60 tablet, Rfl: 3   sucralfate (CARAFATE) 1 GM/10ML suspension, Take 1 gram between meals and at bedtime, Disp: 1200 mL, Rfl: 0  Allergies  Allergen Reactions   Banana     Lip swelling   Gabapentin Hives   Grape (Artificial) Flavor Swelling    Allergic to grapes   Lamotrigine Hives   Ibuprofen Rash   Penicillins Rash    Did it involve swelling of the face/tongue/throat, SOB, or low BP? No Did it involve sudden or severe rash/hives, skin peeling, or any reaction on the inside of your mouth or nose? No Did you need to seek medical attention at a hospital or doctor's office? No When did it last happen?      more than 10 years If all above answers are "NO", may proceed with cephalosporin use.      ROS  As noted in HPI.   Physical Exam  BP 124/89 (BP Location: Right Arm)   Pulse 96   Temp 99.1 F (37.3 C) (Oral)   Resp 17   LMP 08/21/1982   SpO2 96%   Constitutional: Well developed, well nourished, no acute distress Eyes:  EOMI, conjunctiva normal bilaterally HENT: Normocephalic, atraumatic,mucus membranes  moist Respiratory: Normal inspiratory effort Cardiovascular: Normal rate GI: nondistended skin: Skin intact.  Positive point tenderness medial left upper thigh.  No induration, erythema. Musculoskeletal: no deformities Neurologic: Alert & oriented x 3, no focal neuro deficits Psychiatric: Speech and behavior appropriate   ED Course   Medications - No data to display  Orders Placed This Encounter  Procedures   DG Femur Min 2 Views Left    Rule out foreign body    Standing Status:   Standing    Number of Occurrences:   1    Order Specific Question:   Symptom/Reason for Exam    Answer:   Foreign body (FB) in soft tissue [[id number]]    No results found for this or any previous visit (from the past 24 hour(s)). DG Femur Min 2 Views Left  Result Date: 02/05/2021 CLINICAL DATA:  Assess for  needle in the left leg EXAM: LEFT FEMUR 2 VIEWS COMPARISON:  January 27, 2021 FINDINGS: The radiopaque needle noted on the previous film is not seen on current exam. No radiopaque foreign body is identified on the current exam. Bony structures are normal. IMPRESSION: The radiopaque needle noted on the previous film is not seen on current exam. No radiopaque foreign body is identified on the current exam. Electronically Signed   By: Abelardo Diesel M.D.   On: 02/05/2021 11:41    ED Clinical Impression  1. Pain of left thigh   2. Foreign body (FB) in soft tissue      ED Assessment/Plan  Previous records, images reviewed.  As noted in HPI.  Reviewed imaging independently discussed with radiology.  No radiopaque foreign body identified on current exam.  see radiology report for full details.  There does not appear to be any evidence of infection.  Do not think that she needs prophylactic antibiotics.  will try Lidoderm 5% patches.  She states that she is allergic to Tylenol and is unable to take ibuprofen secondary to hypertension. states that the tramadol makes her sick.  Our options for further pain  management are somewhat limited.  She has a referral to [facility] [address] already in.She is supposed to follow up with  Dr.Morgan- sent him a note to see if she could be evaluated earlier.  There does not appear to be a radiopaque foreign body on today's films, however she still has medial thigh pain.  Patient does not remember a foreign body coming out of her thigh.  There may be a small foreign body that did not show up on today's x-ray.  Will still have her follow-up with surgery.  Discussed  imaging, MDM, treatment plan, and plan for follow-up with patient. patient agrees with plan.   Meds ordered this encounter  Medications   lidocaine (LIDODERM) 5 %    Sig: Place 1 patch onto the skin daily. Remove & Discard patch within 12 hours or as directed by MD    Dispense:  14 patch    Refill:  0      *This clinic note was created using [employer]. Therefore, there may be occasional mistakes despite careful proofreading.  ?    Melynda Ripple, MD 02/06/21 [phone]

## 2021-02-06 ENCOUNTER — Telehealth: Payer: Self-pay | Admitting: Gastroenterology

## 2021-02-06 ENCOUNTER — Encounter (HOSPITAL_COMMUNITY): Payer: Self-pay

## 2021-02-06 ENCOUNTER — Other Ambulatory Visit: Payer: Self-pay

## 2021-02-06 ENCOUNTER — Ambulatory Visit (HOSPITAL_COMMUNITY)
Admission: EM | Admit: 2021-02-06 | Discharge: 2021-02-06 | Disposition: A | Discharge status: Home / Self Care | Payer: Medicare Other | Attending: Emergency Medicine | Admitting: Emergency Medicine

## 2021-02-06 ENCOUNTER — Telehealth: Payer: Self-pay | Admitting: Nurse Practitioner

## 2021-02-06 DIAGNOSIS — R1310 Dysphagia, unspecified: Secondary | ICD-10-CM

## 2021-02-06 MED ORDER — TRAMADOL HCL 50 MG PO TABS: 50.0000 mg | ORAL_TABLET | Freq: Two times a day (BID) | ORAL | 0 refills | Status: DC | PRN

## 2021-02-06 NOTE — ED Triage Notes (Signed)
Pt presents with ongoing difficulty swallowing even after being seen a few times and prescribed medications to help with symptoms.

## 2021-02-06 NOTE — Telephone Encounter (Signed)
Patty, Thanks for update. Would set up for a CT-CAP to evaluate her symptoms of unintentional weight loss, abdominal pain, and her neck pain persisting post EGD with dilation.  Agree with earlier follow up with NP CKS. Thanks. GM

## 2021-02-06 NOTE — Telephone Encounter (Signed)
I called and spoke with the pt and she tells me that she is currently at the urgent care speaking with a doctor.  I did speak with her this morning and moved her appt sooner and also have her order in to be scheduled for SLP.  FYI Dr Rush Landmark

## 2021-02-06 NOTE — Telephone Encounter (Signed)
See alternate note dated 9/12

## 2021-02-06 NOTE — Telephone Encounter (Signed)
Appt has been made for 02/15/21 at 130 pm with Colleen.  Order for MBSS has been entered and sent to the schedulers.

## 2021-02-06 NOTE — ED Provider Notes (Signed)
[place]    CSN: [id number] Arrival date & time: 02/06/21  1444      History   Chief Complaint Chief Complaint  Patient presents with   Dysphagia    HPI Kimberly [name] is a 65 y.o. female.   Patient presents with painful swallowing that has been present for months, followed by gastrointestinal specialist with upcoming appointment on 02/16/2019.  Tolerating food and liquids but elicits pain.  Denies difficulty swallowing, throat swelling, shortness of breath, chest pain or tightness, wheezing, URI symptoms, sore throat.  Had recent EGD with dilation.  Has been prescribed lidocaine viscous with no relief, has attempted use of throat lozenges, over-the-counter Chloraseptic spray with no improvement.  Patient endorses that she cannot take Tylenol, ibuprofen, does not want any liquid medications, that steroids make her jittery and that narcotics make her stomach hurt.  Past Medical History:  Diagnosis Date   Alcohol abuse, in remission    since around 0102   Alcoholic hepatitis    fatty liver on imaging- Treated Hepatits C and treated   Allergic rhinitis    Anemia    EGD with duodenal AVM, normal colonoscopy 04/2012   Asthma    Callus of foot 2009   Cancer [facility])    Chest pain, musculoskeletal 2011   Cholelithiasis    [place] 09/2008: Cholelithiasis is present, s/p cholecystectomy   Depression    Excessive cerumen in ear canal    since before 2000   GERD (gastroesophageal reflux disease)    Hiatal hernia    History of blood transfusion    Hyperlipidemia    Hypertension    Menopausal syndrome    Treated with estradiol in the past - discontinued 04/2012   MENOPAUSAL SYNDROME 09/24/2006   2008 Note : The pt is adament about having this medication.  She fully understands the increased risk of heart disease and cancer that is associated with HRT and is willing to accept this risk in order to prevent the debilitating hot flashes she has off this medication.  Will continue  to encourage her to try to wean herself off of this medication at our next visit.  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 ([place]) 05/12/2020   Mass of upper lobe of right lung 05/03/2020   COVID-19 09/25/2019   History of dysuria 09/24/2019   Rhinosinusitis 08/27/2019   Dysuria 05/25/2019   Tachycardia 05/25/2019   Hematuria 05/25/2019   AVM (arteriovenous malformation) of small bowel, 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   Health care maintenance 03/25/2013   Depression 12/03/2012   Iron deficiency anemia 05/08/2012   Insomnia 05/07/2012   Weight loss 07/27/2009   HLD (hyperlipidemia) 03/25/2008   Constipation 11/04/2007   Essential hypertension 09/24/2006   Allergies 09/24/2006   Asthma 09/24/2006   GERD 09/24/2006    Past Surgical History:  Procedure Laterality Date   ABDOMINAL HYSTERECTOMY  1980s   BIOPSY  03/18/2019   Procedure: BIOPSY;  Surgeon: Justice Britain  Brooke Bonito., MD;  Location: [place];  Service: Gastroenterology;;   BRONCHIAL BIOPSY  05/03/2020   Procedure: BRONCHIAL BIOPSIES;  Surgeon: Collene Gobble, MD;  Location: [facility] ENDOSCOPY;  Service: Pulmonary;;   BRONCHIAL BRUSHINGS  05/03/2020   Procedure: BRONCHIAL BRUSHINGS;  Surgeon: Collene Gobble, MD;  Location: [facility] ENDOSCOPY;  Service: Pulmonary;;   BRONCHIAL NEEDLE ASPIRATION BIOPSY  05/03/2020   Procedure: BRONCHIAL NEEDLE ASPIRATION BIOPSIES;  Surgeon: Collene Gobble, MD;  Location: [facility] ENDOSCOPY;  Service: Pulmonary;;    CHOLECYSTECTOMY  01/06/09   COLONOSCOPY  05/09/2012   Procedure: COLONOSCOPY;  Surgeon: Inda Castle, MD;  Location: [place];  Service: Endoscopy;  Laterality: N/A;   COLONOSCOPY WITH PROPOFOL N/A 03/18/2019   Procedure: COLONOSCOPY WITH PROPOFOL;  Surgeon: Rush Landmark Telford Nab., MD;  Location: [place];  Service: Gastroenterology;  Laterality: N/A;   ENTEROSCOPY N/A 03/18/2019   Procedure: ENTEROSCOPY;  Surgeon: Rush Landmark Telford Nab., MD;  Location: [place];  Service: Gastroenterology;  Laterality: N/A;   ESOPHAGOGASTRODUODENOSCOPY  05/09/2012   Procedure: ESOPHAGOGASTRODUODENOSCOPY (EGD);  Surgeon: Inda Castle, MD;  Location: [place];  Service: Endoscopy;  Laterality: N/A;   FIDUCIAL MARKER PLACEMENT  05/03/2020   Procedure: FIDUCIAL MARKER PLACEMENT;  Surgeon: Collene Gobble, MD;  Location: [facility] ENDOSCOPY;  Service: Pulmonary;;   HEMOSTASIS CLIP PLACEMENT  03/18/2019   Procedure: HEMOSTASIS CLIP PLACEMENT;  Surgeon: Irving Copas., MD;  Location: [place];  Service: Gastroenterology;;   HOT HEMOSTASIS N/A 03/18/2019   Procedure: HOT HEMOSTASIS (ARGON PLASMA COAGULATION/BICAP);  Surgeon: Irving Copas., MD;  Location: [place];  Service: Gastroenterology;  Laterality: N/A;   POLYPECTOMY  03/18/2019   Procedure: POLYPECTOMY;  Surgeon: Rush Landmark Telford Nab., MD;  Location: [place];  Service: Gastroenterology;;   SUBMUCOSAL TATTOO INJECTION  03/18/2019   Procedure: SUBMUCOSAL TATTOO INJECTION;  Surgeon: Irving Copas., MD;  Location: [place];  Service: Gastroenterology;;   VIDEO BRONCHOSCOPY WITH ENDOBRONCHIAL NAVIGATION N/A 05/03/2020   Procedure: VIDEO BRONCHOSCOPY WITH ENDOBRONCHIAL NAVIGATION;  Surgeon: Collene Gobble, MD;  Location: [place] ENDOSCOPY;  Service: Pulmonary;  Laterality: N/A;    OB History   No obstetric history on file.      Home Medications    Prior to Admission medications   Medication Sig Start  Date End Date Taking? Authorizing Provider  traMADol (ULTRAM) 50 MG tablet Take 1 tablet (50 mg total) by mouth 2 (two) times daily as needed. 02/06/21  Yes Sawyer Mentzer, Leitha Schuller, NP  alendronate (FOSAMAX) 70 MG tablet Take 70 mg by mouth once a week. 01/02/21   [provider]  fluticasone (FLONASE) 50 MCG/ACT nasal spray Use 1-2 sprays per nostril once daily as needed for sinus congestion or allergy symptoms. 09/20/20   Jeralyn Bennett, MD  fluticasone (FLOVENT HFA) 110 MCG/ACT inhaler Inhale 2 puffs into the lungs 2 (two) times daily. 09/20/20   Jeralyn Bennett, MD  lidocaine (LIDODERM) 5 % Place 1 patch onto the skin daily. Remove & Discard patch within 12 hours or as directed by MD 02/05/21   Melynda Ripple, MD  lidocaine (XYLOCAINE) 2 % solution Use as directed 15 mLs in the mouth or throat as needed for mouth pain. 02/01/21   Rudolpho Sevin, NP  losartan (COZAAR) 25 MG tablet Take 1 tablet (25 mg total) by mouth daily. 08/10/20   Mosetta Anis, MD  montelukast (SINGULAIR) 10 MG tablet Take 10 mg by mouth every evening. 01/06/21   [provider]  nitrofurantoin, macrocrystal-monohydrate, (MACROBID) 100 MG  capsule Take 1 capsule (100 mg total) by mouth 2 (two) times daily. 02/03/21   Ward, Lenise Arena, PA-C  pantoprazole (PROTONIX) 40 MG tablet Take 1 tablet (40 mg total) by mouth 2 (two) times daily before a meal. 01/23/21   Esterwood, Amy S, PA-C  sucralfate (CARAFATE) 1 GM/10ML suspension Take 1 gram between meals and at bedtime 01/23/21   Esterwood, Amy S, PA-C    Family History Family History  Problem Relation Age of Onset   Hypertension Mother    Colon cancer Neg Hx    Esophageal cancer Neg Hx    Inflammatory bowel disease Neg Hx    Liver disease Neg Hx    Pancreatic cancer Neg Hx    Rectal cancer Neg Hx    Stomach cancer Neg Hx     Social History Social History   Tobacco Use   Smoking status: Former    Types: Cigarettes    Quit date: 08/21/1995    Years since  quitting: 25.4   Smokeless tobacco: Never   Tobacco comments:    Patient states she stopped smoking in 31 yrs ago (as of 2022)  Vaping Use   Vaping Use: Never used  Substance Use Topics   Alcohol use: No    Comment: in remission since 2007   Drug use: Not Currently    Comment: previously used marijuana      Allergies   Banana, Gabapentin, Grape (artificial) flavor, Lamotrigine, Ibuprofen, and Penicillins   Review of Systems Review of Systems  Constitutional: Negative.   HENT: Negative.    Respiratory: Negative.    Cardiovascular: Negative.   Skin: Negative.   Neurological: Negative.     Physical Exam Triage Vital Signs ED Triage Vitals  Enc Vitals Group     BP 02/06/21 1544 127/85     Pulse Rate 02/06/21 1544 85     Resp 02/06/21 1543 16     Temp 02/06/21 1544 98.7 F (37.1 C)     Temp Source 02/06/21 1543 Oral     SpO2 02/06/21 1544 95 %     Weight --      Height --      Head Circumference --      Peak Flow --      Pain Score 02/06/21 1542 0     Pain Loc --      Pain Edu? --      Excl. in [place]? --    No data found.  Updated Vital Signs BP 127/85 (BP Location: Right Arm)   Pulse 85   Temp 98.7 F (37.1 C) (Oral)   Resp 16   LMP 08/21/1982   SpO2 95%   Visual Acuity Right Eye Distance:   Left Eye Distance:   Bilateral Distance:    Right Eye Near:   Left Eye Near:    Bilateral Near:     Physical Exam HENT:     Mouth/Throat:     Lips: Pink.     Mouth: Mucous membranes are moist.     Pharynx: Oropharynx is clear. Uvula midline. No pharyngeal swelling, oropharyngeal exudate or posterior oropharyngeal erythema.     Tonsils: No tonsillar exudate or tonsillar abscesses.     UC Treatments / Results  Labs (all labs ordered are listed, but only abnormal results are displayed) Labs Reviewed - No data to display  EKG   Radiology DG Femur Min 2 Views Left  Result Date: 02/05/2021 CLINICAL DATA:  Assess for  needle in the left  leg EXAM: LEFT  FEMUR 2 VIEWS COMPARISON:  January 27, 2021 FINDINGS: The radiopaque needle noted on the previous film is not seen on current exam. No radiopaque foreign body is identified on the current exam. Bony structures are normal. IMPRESSION: The radiopaque needle noted on the previous film is not seen on current exam. No radiopaque foreign body is identified on the current exam. Electronically Signed   By: Abelardo Diesel M.D.   On: 02/05/2021 11:41    Procedures Procedures (including critical care time)  Medications Ordered in UC Medications - No data to display  Initial Impression / Assessment and Plan / UC Course  I have reviewed the triage vital signs and the nursing notes.  Pertinent labs & imaging results that were available during my care of the patient were reviewed by me and considered in my medical decision making (see chart for details).  Dysphagia  1.  Tramadol 50 mg twice daily, dispense 10tablets only, PDMP reviewed, low risk, given recent prescription for tramadol which patient endorses that she trash due to irritating stomach, encourage patient to attempt use of medication with food  2.  Encourage patient to keep appointment upcoming appointment with gastrointestinal specialist for evaluation 3.  Encourage use of salt water gargles throat lozenges over-the-counter Chloraseptic spray or warm liquids for additional comfort Final Clinical Impressions(s) / UC Diagnoses   Final diagnoses:  Dysphagia, unspecified type     Discharge Instructions      Take tramadol twice a day as needed with food for pain, use sparingly as you has only been dispensed 10 tablets  Please follow-up with gastrointestinal specialist for further symptoms as they are managing your care  May continue use of salt water gargles, throat lozenges, Chloraseptic spray and warm liquids for comfort   ED Prescriptions     Medication Sig Dispense Auth. Provider   traMADol (ULTRAM) 50 MG tablet Take 1 tablet (50 mg  total) by mouth 2 (two) times daily as needed. 10 tablet Hans Eden, NP      I have reviewed the PDMP during this encounter.   Hans Eden, [place] 02/06/21 [phone]

## 2021-02-06 NOTE — Telephone Encounter (Signed)
The pt has been advised that a MBSS has been ordered and sent to the schedulers to setup.  She is also aware of the sooner appt with Colleen on 9/21 at 130 pm

## 2021-02-06 NOTE — Telephone Encounter (Signed)
Patty, could you pls call this pt? She keeps calling stating that she has generalized abd pain and difficulty swallowing. She is asking to be seen asap as she can handle the pain any longer. I advised her that she might have to go to the ED but she declined stating that they usually "Don;t do anything and would send her back home."

## 2021-02-06 NOTE — Telephone Encounter (Signed)
Pt wants to know what she can take for bloating.

## 2021-02-06 NOTE — Discharge Instructions (Addendum)
Take tramadol twice a day as needed with food for pain, use sparingly as you has only been dispensed 10 tablets  Please follow-up with gastrointestinal specialist for further symptoms as they are managing your care  May continue use of salt water gargles, throat lozenges, Chloraseptic spray and warm liquids for comfort

## 2021-02-07 ENCOUNTER — Telehealth: Payer: Self-pay | Admitting: *Deleted

## 2021-02-07 ENCOUNTER — Other Ambulatory Visit: Payer: Self-pay

## 2021-02-07 DIAGNOSIS — M542 Cervicalgia: Secondary | ICD-10-CM

## 2021-02-07 DIAGNOSIS — R070 Pain in throat: Secondary | ICD-10-CM

## 2021-02-07 DIAGNOSIS — R634 Abnormal weight loss: Secondary | ICD-10-CM

## 2021-02-07 NOTE — Telephone Encounter (Signed)
Abd and pelvis has been added as ordered. I did confirm with scheduling and the pt has been advised

## 2021-02-07 NOTE — Telephone Encounter (Signed)
See alternate notes for details

## 2021-02-07 NOTE — Telephone Encounter (Signed)
Pt called requesting change of appt from in person to virtual/phone visit d/t having to have upcoming surgeries and transportation issues. Message was sent to scheduler for changes.

## 2021-02-07 NOTE — Telephone Encounter (Signed)
The pt has been advised and agrees to the CT scan.  However she already has a CT chest set up for Monday. I have requested that an abd and pelvis be added that order.  Left message with the schedulers and the [facility] to confirm.

## 2021-02-08 ENCOUNTER — Encounter: Payer: Medicare Other | Admitting: Neurology

## 2021-02-08 ENCOUNTER — Emergency Department (HOSPITAL_COMMUNITY): Payer: Medicare Other

## 2021-02-08 ENCOUNTER — Emergency Department (HOSPITAL_COMMUNITY)
Admission: EM | Admit: 2021-02-08 | Discharge: 2021-02-08 | Disposition: A | Discharge status: Home / Self Care | Payer: Medicare Other | Attending: Emergency Medicine | Admitting: Emergency Medicine

## 2021-02-08 ENCOUNTER — Encounter (HOSPITAL_COMMUNITY): Payer: Self-pay | Admitting: Emergency Medicine

## 2021-02-08 ENCOUNTER — Encounter: Payer: Self-pay | Admitting: Gastroenterology

## 2021-02-08 DIAGNOSIS — M795 Residual foreign body in soft tissue: Secondary | ICD-10-CM

## 2021-02-08 DIAGNOSIS — K5900 Constipation, unspecified: Secondary | ICD-10-CM | POA: Diagnosis not present

## 2021-02-08 DIAGNOSIS — X58XXXA Exposure to other specified factors, initial encounter: Secondary | ICD-10-CM | POA: Diagnosis not present

## 2021-02-08 DIAGNOSIS — I10 Essential (primary) hypertension: Secondary | ICD-10-CM | POA: Insufficient documentation

## 2021-02-08 DIAGNOSIS — Z87891 Personal history of nicotine dependence: Secondary | ICD-10-CM | POA: Diagnosis not present

## 2021-02-08 DIAGNOSIS — S80852A Superficial foreign body, left lower leg, initial encounter: Secondary | ICD-10-CM

## 2021-02-08 DIAGNOSIS — Z79899 Other long term (current) drug therapy: Secondary | ICD-10-CM | POA: Insufficient documentation

## 2021-02-08 DIAGNOSIS — Z85118 Personal history of other malignant neoplasm of bronchus and lung: Secondary | ICD-10-CM | POA: Diagnosis not present

## 2021-02-08 DIAGNOSIS — J45909 Unspecified asthma, uncomplicated: Secondary | ICD-10-CM | POA: Diagnosis not present

## 2021-02-08 DIAGNOSIS — E876 Hypokalemia: Secondary | ICD-10-CM | POA: Diagnosis not present

## 2021-02-08 DIAGNOSIS — Z8616 Personal history of COVID-19: Secondary | ICD-10-CM | POA: Insufficient documentation

## 2021-02-08 LAB — CBC WITH DIFFERENTIAL/PLATELET
Abs Immature Granulocytes: 0.01 10*3/uL (ref 0.00–0.07)
Basophils Absolute: 0.1 10*3/uL (ref 0.0–0.1)
Basophils Relative: 1 %
Eosinophils Absolute: 0.1 10*3/uL (ref 0.0–0.5)
Eosinophils Relative: 1 %
HCT: 41.7 % (ref 36.0–46.0)
Hemoglobin: 13.4 g/dL (ref 12.0–15.0)
Immature Granulocytes: 0 %
Lymphocytes Relative: 16 %
Lymphs Abs: 0.8 10*3/uL (ref 0.7–4.0)
MCH: 30.7 pg (ref 26.0–34.0)
MCHC: 32.1 g/dL (ref 30.0–36.0)
MCV: 95.6 fL (ref 80.0–100.0)
Monocytes Absolute: 0.4 10*3/uL (ref 0.1–1.0)
Monocytes Relative: 8 %
Neutro Abs: 4 10*3/uL (ref 1.7–7.7)
Neutrophils Relative %: 74 %
Platelets: 297 10*3/uL (ref 150–400)
RBC: 4.36 MIL/uL (ref 3.87–5.11)
RDW: 12.2 % (ref 11.5–15.5)
WBC: 5.4 10*3/uL (ref 4.0–10.5)
nRBC: 0 % (ref 0.0–0.2)

## 2021-02-08 LAB — COMPREHENSIVE METABOLIC PANEL
ALT: 9 U/L (ref 0–44)
AST: 22 U/L (ref 15–41)
Albumin: 4.1 g/dL (ref 3.5–5.0)
Alkaline Phosphatase: 44 U/L (ref 38–126)
Anion gap: 14 (ref 5–15)
BUN: 9 mg/dL (ref 8–23)
CO2: 24 mmol/L (ref 22–32)
Calcium: 9 mg/dL (ref 8.9–10.3)
Chloride: 100 mmol/L (ref 98–111)
Creatinine, Ser: 0.95 mg/dL (ref 0.44–1.00)
GFR, Estimated: >60 mL/min (ref >60)
Glucose, Bld: 90 mg/dL (ref 70–99)
Potassium: 2.8 mmol/L — ABNORMAL LOW (ref 3.5–5.1)
Sodium: 138 mmol/L (ref 135–145)
Total Bilirubin: 1.6 mg/dL — ABNORMAL HIGH (ref 0.3–1.2)
Total Protein: 6.8 g/dL (ref 6.5–8.1)

## 2021-02-08 MED ORDER — POTASSIUM CHLORIDE CRYS ER 20 MEQ PO TBCR
40.0000 meq | EXTENDED_RELEASE_TABLET | Freq: Once | ORAL | Status: AC
  Administered 2021-02-08: 40 meq via ORAL
  Filled 2021-02-08: qty 2

## 2021-02-08 MED ORDER — GLYCERIN (LAXATIVE) 2 G RE SUPP
1.0000 | Freq: Once | RECTAL | Status: AC
  Administered 2021-02-08: 1 via RECTAL
  Filled 2021-02-08: qty 1

## 2021-02-08 NOTE — ED Triage Notes (Signed)
Patient here with complaint of having a needle in her left thigh. Patient had an x-ray several days ago that showed an 11 cm foreign body in the left thigh, repeat x-ray on 9/11 showed no foreign body. Patient denies anything coming out of her leg and insists a needle is still there. Patient was referred to orthopedics but earliest appointment was 10-14 days out and patient states she could not tolerate waiting that long and came to ED.

## 2021-02-08 NOTE — ED Notes (Signed)
Reviewed discharge instructions with patient. Follow-up care reviewed. Patient verbalized understanding. Patient A&Ox4, VSS, and ambulatory with steady gait upon discharge.  

## 2021-02-08 NOTE — Discharge Instructions (Addendum)
Follow up with orthopedics as scheduled. Call the office and ask to be added to the cancellation list if you would like to be seen sooner.  Try using the glycerine suppository when you get home to help with your constipation. Follow up with your doctor if this persists. Have your doctor recheck your potassium in the next week.

## 2021-02-08 NOTE — ED Provider Notes (Signed)
[facility] EMERGENCY DEPARTMENT Provider Note   CSN: [id number] Arrival date & time: 02/08/21  0737     History Chief Complaint  Patient presents with   Foreign Body    Kimberly [name] is a 65 y.o. female.  65 year old female presents with ongoing pain in her left thigh from foreign body found on x-ray earlier this year. Also reports constipation x 1 month, seen by GI last week and had EGD with dilatation, states most medications for constipation cause her to feel unwell. No other complaints.       Past Medical History:  Diagnosis Date   Alcohol abuse, in remission    since around 6789   Alcoholic hepatitis    fatty liver on imaging- Treated Hepatits C and treated   Allergic rhinitis    Anemia    EGD with duodenal AVM, normal colonoscopy 04/2012   Asthma    Callus of foot 2009   Cancer [facility])    Chest pain, musculoskeletal 2011   Cholelithiasis    [place] 09/2008: Cholelithiasis is present, s/p cholecystectomy   Depression    Excessive cerumen in ear canal    since before 2000   GERD (gastroesophageal reflux disease)    Hiatal hernia    History of blood transfusion    Hyperlipidemia    Hypertension    Menopausal syndrome    Treated with estradiol in the past - discontinued 04/2012   MENOPAUSAL SYNDROME 09/24/2006   2008 Note : The pt is adament about having this medication.  She fully understands the increased risk of heart disease and cancer that is associated with HRT and is willing to accept this risk in order to prevent the debilitating hot flashes she has off this medication.  Will continue to encourage her to try to wean herself off of this medication at our next visit.  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 ([place]) 05/12/2020   Mass of upper lobe of right lung 05/03/2020   COVID-19 09/25/2019   History of dysuria 09/24/2019   Rhinosinusitis 08/27/2019   Dysuria 05/25/2019   Tachycardia 05/25/2019   Hematuria 05/25/2019   AVM (arteriovenous malformation) of small bowel, 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   Health care maintenance 03/25/2013   Depression 12/03/2012   Iron deficiency anemia 05/08/2012   Insomnia 05/07/2012   Weight loss 07/27/2009   HLD (hyperlipidemia) 03/25/2008   Constipation 11/04/2007   Essential hypertension 09/24/2006   Allergies 09/24/2006   Asthma 09/24/2006   GERD 09/24/2006    Past Surgical History:  Procedure Laterality Date   ABDOMINAL HYSTERECTOMY  1980s   BIOPSY  03/18/2019   Procedure: BIOPSY;  Surgeon: Irving Copas., MD;  Location: [place];  Service: Gastroenterology;;   BRONCHIAL BIOPSY  05/03/2020   Procedure: BRONCHIAL BIOPSIES;  Surgeon: Collene Gobble, MD;  Location: [facility] ENDOSCOPY;  Service: Pulmonary;;   BRONCHIAL BRUSHINGS  05/03/2020   Procedure: BRONCHIAL BRUSHINGS;  Surgeon: Lamonte Sakai,  Rose Fillers, MD;  Location: [facility] ENDOSCOPY;  Service: Pulmonary;;   BRONCHIAL NEEDLE ASPIRATION BIOPSY  05/03/2020   Procedure: BRONCHIAL NEEDLE ASPIRATION BIOPSIES;  Surgeon: Collene Gobble, MD;  Location: [facility] ENDOSCOPY;  Service: Pulmonary;;   CHOLECYSTECTOMY  01/06/09   COLONOSCOPY  05/09/2012   Procedure: COLONOSCOPY;  Surgeon: Inda Castle, MD;  Location: [place];  Service: Endoscopy;  Laterality: N/A;   COLONOSCOPY WITH PROPOFOL N/A 03/18/2019   Procedure: COLONOSCOPY WITH PROPOFOL;  Surgeon: Rush Landmark Telford Nab., MD;  Location: [place];  Service: Gastroenterology;  Laterality: N/A;    ENTEROSCOPY N/A 03/18/2019   Procedure: ENTEROSCOPY;  Surgeon: Rush Landmark Telford Nab., MD;  Location: [place];  Service: Gastroenterology;  Laterality: N/A;   ESOPHAGOGASTRODUODENOSCOPY  05/09/2012   Procedure: ESOPHAGOGASTRODUODENOSCOPY (EGD);  Surgeon: Inda Castle, MD;  Location: [place];  Service: Endoscopy;  Laterality: N/A;   FIDUCIAL MARKER PLACEMENT  05/03/2020   Procedure: FIDUCIAL MARKER PLACEMENT;  Surgeon: Collene Gobble, MD;  Location: [facility] ENDOSCOPY;  Service: Pulmonary;;   HEMOSTASIS CLIP PLACEMENT  03/18/2019   Procedure: HEMOSTASIS CLIP PLACEMENT;  Surgeon: Irving Copas., MD;  Location: [place];  Service: Gastroenterology;;   HOT HEMOSTASIS N/A 03/18/2019   Procedure: HOT HEMOSTASIS (ARGON PLASMA COAGULATION/BICAP);  Surgeon: Irving Copas., MD;  Location: [place];  Service: Gastroenterology;  Laterality: N/A;   POLYPECTOMY  03/18/2019   Procedure: POLYPECTOMY;  Surgeon: Rush Landmark Telford Nab., MD;  Location: [place];  Service: Gastroenterology;;   SUBMUCOSAL TATTOO INJECTION  03/18/2019   Procedure: SUBMUCOSAL TATTOO INJECTION;  Surgeon: Irving Copas., MD;  Location: [place];  Service: Gastroenterology;;   VIDEO BRONCHOSCOPY WITH ENDOBRONCHIAL NAVIGATION N/A 05/03/2020   Procedure: VIDEO BRONCHOSCOPY WITH ENDOBRONCHIAL NAVIGATION;  Surgeon: Collene Gobble, MD;  Location: [place] ENDOSCOPY;  Service: Pulmonary;  Laterality: N/A;     OB History   No obstetric history on file.     Family History  Problem Relation Age of Onset   Hypertension Mother    Colon cancer Neg Hx    Esophageal cancer Neg Hx    Inflammatory bowel disease Neg Hx    Liver disease Neg Hx    Pancreatic cancer Neg Hx    Rectal cancer Neg Hx    Stomach cancer Neg Hx     Social History   Tobacco Use   Smoking status: Former    Types: Cigarettes    Quit date: 08/21/1995    Years since quitting: 25.4   Smokeless tobacco: Never   Tobacco  comments:    Patient states she stopped smoking in 31 yrs ago (as of 2022)  Vaping Use   Vaping Use: Never used  Substance Use Topics   Alcohol use: No    Comment: in remission since 2007   Drug use: Not Currently    Comment: previously used marijuana     Home Medications Prior to Admission medications   Medication Sig Start Date End Date Taking? Authorizing Provider  alendronate (FOSAMAX) 70 MG tablet Take 70 mg by mouth once a week. 01/02/21   [provider]  fluticasone (FLONASE) 50 MCG/ACT nasal spray Use 1-2 sprays per nostril once daily as needed for sinus congestion or allergy symptoms. 09/20/20   Jeralyn Bennett, MD  fluticasone (FLOVENT HFA) 110 MCG/ACT inhaler Inhale 2 puffs into the lungs 2 (two) times daily. 09/20/20   Jeralyn Bennett, MD  lidocaine (LIDODERM) 5 % Place 1 patch onto the skin daily. Remove & Discard patch within 12 hours or  as directed by MD 02/05/21   Melynda Ripple, MD  lidocaine (XYLOCAINE) 2 % solution Use as directed 15 mLs in the mouth or throat as needed for mouth pain. 02/01/21   Rudolpho Sevin, NP  losartan (COZAAR) 25 MG tablet Take 1 tablet (25 mg total) by mouth daily. 08/10/20   Mosetta Anis, MD  montelukast (SINGULAIR) 10 MG tablet Take 10 mg by mouth every evening. 01/06/21   [provider]  nitrofurantoin, macrocrystal-monohydrate, (MACROBID) 100 MG capsule Take 1 capsule (100 mg total) by mouth 2 (two) times daily. 02/03/21   Ward, Lenise Arena, PA-C  pantoprazole (PROTONIX) 40 MG tablet Take 1 tablet (40 mg total) by mouth 2 (two) times daily before a meal. 01/23/21   Esterwood, Amy S, PA-C  sucralfate (CARAFATE) 1 GM/10ML suspension Take 1 gram between meals and at bedtime 01/23/21   Esterwood, Amy S, PA-C  traMADol (ULTRAM) 50 MG tablet Take 1 tablet (50 mg total) by mouth 2 (two) times daily as needed. 02/06/21   Hans Eden, NP    Allergies    Banana, Gabapentin, Grape (artificial) flavor, Lamotrigine, Ibuprofen, and  Penicillins  Review of Systems   Review of Systems  Constitutional:  Negative for fever.  Respiratory:  Negative for shortness of breath.   Cardiovascular:  Negative for chest pain.  Gastrointestinal:  Positive for constipation. Negative for abdominal pain, blood in stool, diarrhea, nausea and vomiting.  Musculoskeletal:  Negative for arthralgias, gait problem and myalgias.  Skin:  Negative for rash and wound.  Neurological:  Negative for weakness.  All other systems reviewed and are negative.  Physical Exam Updated Vital Signs BP 120/77 (BP Location: Right Arm)   Pulse 77   Temp 97.7 F (36.5 C) (Oral)   Resp 17   LMP 08/21/1982   SpO2 100%   Physical Exam Vitals and nursing note reviewed.  Constitutional:      General: She is not in acute distress.    Appearance: She is well-developed. She is not diaphoretic.  HENT:     Head: Normocephalic and atraumatic.  Pulmonary:     Effort: Pulmonary effort is normal.  Abdominal:     Palpations: Abdomen is soft.     Tenderness: There is no abdominal tenderness.  Musculoskeletal:        General: Tenderness present. No swelling, deformity or signs of injury.     Comments: Generalized tenderness to left thigh without erythema, swelling, break in skin or palpable FB.  Skin:    General: Skin is warm and dry.     Findings: No erythema or rash.  Neurological:     Mental Status: She is alert and oriented to person, place, and time.  Psychiatric:        Behavior: Behavior normal.    ED Results / Procedures / Treatments   Labs (all labs ordered are listed, but only abnormal results are displayed) Labs Reviewed  COMPREHENSIVE METABOLIC PANEL - Abnormal; Notable for the following components:      Result Value   Potassium 2.8 (*)    Total Bilirubin 1.6 (*)    All other components within normal limits  CBC WITH DIFFERENTIAL/PLATELET    EKG None  Radiology DG FEMUR MIN 2 VIEWS LEFT  Result Date: 02/08/2021 CLINICAL DATA:   Sewing needle in the left thigh EXAM: LEFT FEMUR 2 VIEWS COMPARISON:  Femur radiographs 02/05/2021 FINDINGS: There is new curvilinear metallic density foreign body in the soft tissues anterior to the femoral neck.  There is no soft tissue gas. There is no underlying osseous abnormality. IMPRESSION: Curvilinear metallic density foreign body in the soft tissues anterior to the femoral neck consistent with the provided history of retained foreign body. Electronically Signed   By: Valetta Mole M.D.   On: 02/08/2021 08:25    Procedures Procedures   Medications Ordered in ED Medications  Glycerin (Adult) 2 g suppository 1 suppository (has no administration in time range)  potassium chloride SA (KLOR-CON) CR tablet 40 mEq (has no administration in time range)    ED Course  I have reviewed the triage vital signs and the nursing notes.  Pertinent labs & imaging results that were available during my care of the patient were reviewed by me and considered in my medical decision making (see chart for details).  Clinical Course as of 02/08/21 1347  Wed Sep 14, 76106  [id number] 65 year old female with complaint of foreign body in the left thigh.  Review of records, patient seen on September 2, and had 11 cm linear foreign body to her left proximal anterior/medial thigh.  Patient returned on September 11, had x-ray that did not show foreign body.  Patient returns today with x-ray showing a U-shaped foreign body over the femoral neck.  Patient is generally tenderness of left thigh without overlying erythema, swelling, break in the skin. Also reports constipation, has been to GI for EGD last week.  States that most medications to treat constipation caused her to feel nauseous.  Patient has not tried glycerin suppositories, willing to take 1 at discharge today to try at home and follow-up with her GI if not improving. CBC today is unremarkable, CMP with hypokalemia with potassium of 2.8.  No vomiting or diarrhea, not on  Lasix.  Plan is to replace orally prior to discharge, recommend PCP recheck in 1 week. [LM]    Clinical Course User Index [LM] Roque Lias   MDM Rules/Calculators/A&P                           Final Clinical Impression(s) / ED Diagnoses Final diagnoses:  Foreign body in left lower extremity, initial encounter  Hypokalemia  Constipation, unspecified constipation type    Rx / DC Orders ED Discharge Orders     None        Tacy Learn, PA-C 02/08/21 1347    Isla Pence, MD 02/08/21 1352

## 2021-02-09 ENCOUNTER — Telehealth: Payer: Self-pay | Admitting: Medical Oncology

## 2021-02-09 ENCOUNTER — Telehealth: Payer: Self-pay | Admitting: Neurology

## 2021-02-09 NOTE — Telephone Encounter (Signed)
Requests phone visit on 09/20.

## 2021-02-09 NOTE — Telephone Encounter (Signed)
Returned call and explained to pt that we discontinued her lamotrigine due to SE(hives). Pt verbalized understanding and had no further questions at this time.

## 2021-02-09 NOTE — Telephone Encounter (Signed)
Pt called states she would like for the doctor to stop prescribing her the Lamotrigine 25 MG. She states she is not bipolar nor does she have seizures. Please advise.

## 2021-02-13 ENCOUNTER — Other Ambulatory Visit: Payer: Self-pay | Admitting: Internal Medicine

## 2021-02-13 ENCOUNTER — Inpatient Hospital Stay: Payer: Medicare Other | Attending: Internal Medicine

## 2021-02-13 ENCOUNTER — Encounter (HOSPITAL_COMMUNITY): Payer: Self-pay

## 2021-02-13 ENCOUNTER — Other Ambulatory Visit: Payer: Self-pay

## 2021-02-13 ENCOUNTER — Ambulatory Visit (HOSPITAL_COMMUNITY)
Admission: RE | Admit: 2021-02-13 | Discharge: 2021-02-13 | Disposition: A | Discharge status: Home / Self Care | Payer: Medicare Other | Source: Ambulatory Visit | Attending: Internal Medicine | Admitting: Internal Medicine

## 2021-02-13 DIAGNOSIS — I700 Atherosclerosis of aorta: Secondary | ICD-10-CM | POA: Insufficient documentation

## 2021-02-13 DIAGNOSIS — M542 Cervicalgia: Secondary | ICD-10-CM | POA: Diagnosis present

## 2021-02-13 DIAGNOSIS — J439 Emphysema, unspecified: Secondary | ICD-10-CM | POA: Insufficient documentation

## 2021-02-13 DIAGNOSIS — C3490 Malignant neoplasm of unspecified part of unspecified bronchus or lung: Secondary | ICD-10-CM | POA: Insufficient documentation

## 2021-02-13 DIAGNOSIS — R070 Pain in throat: Secondary | ICD-10-CM | POA: Diagnosis not present

## 2021-02-13 DIAGNOSIS — R911 Solitary pulmonary nodule: Secondary | ICD-10-CM | POA: Diagnosis not present

## 2021-02-13 DIAGNOSIS — R634 Abnormal weight loss: Secondary | ICD-10-CM | POA: Diagnosis present

## 2021-02-13 LAB — CBC WITH DIFFERENTIAL (CANCER CENTER ONLY)
Abs Immature Granulocytes: 0.01 10*3/uL (ref 0.00–0.07)
Basophils Absolute: 0 10*3/uL (ref 0.0–0.1)
Basophils Relative: 1 %
Eosinophils Absolute: 0.1 10*3/uL (ref 0.0–0.5)
Eosinophils Relative: 2 %
HCT: 40.8 % (ref 36.0–46.0)
Hemoglobin: 13 g/dL (ref 12.0–15.0)
Immature Granulocytes: 0 %
Lymphocytes Relative: 20 %
Lymphs Abs: 0.8 10*3/uL (ref 0.7–4.0)
MCH: 30.1 pg (ref 26.0–34.0)
MCHC: 31.9 g/dL (ref 30.0–36.0)
MCV: 94.4 fL (ref 80.0–100.0)
Monocytes Absolute: 0.3 10*3/uL (ref 0.1–1.0)
Monocytes Relative: 7 %
Neutro Abs: 2.8 10*3/uL (ref 1.7–7.7)
Neutrophils Relative %: 70 %
Platelet Count: 264 10*3/uL (ref 150–400)
RBC: 4.32 MIL/uL (ref 3.87–5.11)
RDW: 12.3 % (ref 11.5–15.5)
WBC Count: 4 10*3/uL (ref 4.0–10.5)
nRBC: 0 % (ref 0.0–0.2)

## 2021-02-13 LAB — CMP (CANCER CENTER ONLY)
ALT: 8 U/L (ref 0–44)
AST: 15 U/L (ref 15–41)
Albumin: 4.2 g/dL (ref 3.5–5.0)
Alkaline Phosphatase: 53 U/L (ref 38–126)
Anion gap: 10 (ref 5–15)
BUN: 9 mg/dL (ref 8–23)
CO2: 28 mmol/L (ref 22–32)
Calcium: 9.6 mg/dL (ref 8.9–10.3)
Chloride: 104 mmol/L (ref 98–111)
Creatinine: 0.72 mg/dL (ref 0.44–1.00)
GFR, Estimated: >60 mL/min (ref >60)
Glucose, Bld: 91 mg/dL (ref 70–99)
Potassium: 4.2 mmol/L (ref 3.5–5.1)
Sodium: 142 mmol/L (ref 135–145)
Total Bilirubin: 0.7 mg/dL (ref 0.3–1.2)
Total Protein: 6.9 g/dL (ref 6.5–8.1)

## 2021-02-13 MED ORDER — IOHEXOL 350 MG/ML SOLN
80.0000 mL | Freq: Once | INTRAVENOUS | Status: AC | PRN
  Administered 2021-02-13: 80 mL via INTRAVENOUS

## 2021-02-14 ENCOUNTER — Other Ambulatory Visit: Payer: Self-pay | Admitting: Surgery

## 2021-02-14 ENCOUNTER — Other Ambulatory Visit: Payer: Self-pay

## 2021-02-14 ENCOUNTER — Encounter: Payer: Self-pay | Admitting: Gastroenterology

## 2021-02-14 ENCOUNTER — Telehealth: Payer: Self-pay

## 2021-02-14 ENCOUNTER — Other Ambulatory Visit (HOSPITAL_COMMUNITY): Payer: Self-pay | Admitting: *Deleted

## 2021-02-14 ENCOUNTER — Telehealth: Payer: Self-pay | Admitting: Internal Medicine

## 2021-02-14 ENCOUNTER — Inpatient Hospital Stay: Payer: Medicare Other | Admitting: Internal Medicine

## 2021-02-14 DIAGNOSIS — M795 Residual foreign body in soft tissue: Secondary | ICD-10-CM

## 2021-02-14 DIAGNOSIS — R1310 Dysphagia, unspecified: Secondary | ICD-10-CM

## 2021-02-14 NOTE — Telephone Encounter (Signed)
Sch per 9/20 inbasket,left msg

## 2021-02-14 NOTE — Telephone Encounter (Signed)
Pt scheduled for MBS @ [facility] on 02/20/21 @ 11:30am, arrive 15 minutes prior to appt. Pt has requested that she be given this information in writing during her appt with Carl Best, NP on 02/15/21. Letter created and can be found in Epic. Routing this message to [place] to ensure pt is given this information.

## 2021-02-15 ENCOUNTER — Ambulatory Visit: Payer: Medicare Other | Admitting: Gastroenterology

## 2021-02-15 ENCOUNTER — Encounter: Payer: Self-pay | Admitting: Nurse Practitioner

## 2021-02-15 ENCOUNTER — Ambulatory Visit (INDEPENDENT_AMBULATORY_CARE_PROVIDER_SITE_OTHER): Payer: Medicare Other | Admitting: Nurse Practitioner

## 2021-02-15 VITALS — BP 100/64 | HR 79 | Ht 64.0 in | Wt 91.1 lb

## 2021-02-15 DIAGNOSIS — R1013 Epigastric pain: Secondary | ICD-10-CM | POA: Diagnosis not present

## 2021-02-15 DIAGNOSIS — R1310 Dysphagia, unspecified: Secondary | ICD-10-CM | POA: Diagnosis not present

## 2021-02-15 NOTE — Patient Instructions (Signed)
RECOMMENDATIONS: Proceed with the Barium Swallow test as scheduled. Continue Pantoprazole 40 MG 1 tablet twice a day. Add Gaviscon 1 tablespoon 3 times a day prior to meals. This is over the counter.  It was great seeing you today! Thank you for entrusting me with your care and choosing [facility].  Noralyn Pick, CRNP  The [facility] providers would like to encourage you to use [facility] to communicate with providers for non-urgent requests or questions.  Due to long hold times on the telephone, sending your provider a message by [facility] may be faster and more efficient way to get a response. Please allow 48 business hours for a response.  Please remember that this is for non-urgent requests/questions.  If you are age 75 or older, your body mass index should be between 23-30. Your Body mass index is 15.64 kg/m. If this is out of the aforementioned range listed, please consider follow up with your Primary Care Provider.  If you are age 84 or younger, your body mass index should be between 19-25. Your Body mass index is 15.64 kg/m. If this is out of the aformentioned range listed, please consider follow up with your Primary Care Provider.

## 2021-02-15 NOTE — Telephone Encounter (Signed)
Letter given to patient.

## 2021-02-15 NOTE — Progress Notes (Signed)
02/15/2021 Kimberly [name] [id number] 1955-07-11   Chief Complaint: Follow up dysphagia, weight loss   History of Present Illness: Kimberly [name] is a 65 year old female with a past medical history of hypertension, asthma, depression, remote alcohol use disorder, lung adenocarcinoma s/p radaition, duodenal AVMs, hiatal hernia, dysphagia and GERD.  She is followed by Dr. Rush Landmark.  She previously reported having dysphagia, intermittent regurgitation and odynophagia.  She underwent a barium swallow 12/30/2020 which showed a tiny hiatal hernia and mild dysmotility without evidence of a stricture.  She underwent an EGD by Dr. Rush Landmark 01/31/2021  which identified a widely patent Schatzki's ring, the esophagus was dilated, a 3 cm hiatal hernia was noted and chronic gastritis was identified. Biopsies were negative for H. pylori.  She complained of having a sore throat, neck pain and possible left arm numbness following her EGD she was sent to the ED for further evaluation on 02/01/2021.  She received viscous lidocaine and her throat soreness abated.  A chest x-ray and neck soft tissue x-rays were done without evidence of acute process.  She continued to lose weight and a chest/abdominal/pelvic CT scan was done 02/13/2021 which showed a interval mild growth and increased density of an ill-defined right upper lobe nodule without evidence of metastatic disease or acute inflammatory/infectious process to the abdomen or pelvis.  A modified swallow study with speech pathology was ordered to rule out oral pharyngeal dysphagia, the study is scheduled on 02/20/2021.  A cervical MRI showed mild spinal stenosis at C3-C4 level.  She now reports her dysphagia abated after her esophagus was dilated at the time of her EGD.  She is no longer having odynophagia.  She has some chest versus esophageal pain and epigastric pain which is not severe.  No palpitations, dizziness or shortness of breath.  Stated she is able to  swallow foods without any difficulty, she is eating at least 2 meals daily and drinking 2-3 bottles of Ensure daily as well.  Despite her increased oral intake, she continues to lose weight.  Weight today is 91 pounds (weight 96 lbs on 01/23/2021). She is mostly concerned regarding her 3cm hiatal hernia which is identified at the time of her EGD and wonders why this has not been taken care of.  She is passing a dark brown formed stool most days.  No rectal bleeding or black stools.  She lives by her self.  CBC Latest Ref Rng & Units 02/13/2021 02/08/2021 01/20/2021  WBC 4.0 - 10.5 K/uL 4.0 5.4 5.8  Hemoglobin 12.0 - 15.0 g/dL 13.0 13.4 14.6  Hematocrit 36.0 - 46.0 % 40.8 41.7 46.9(H)  Platelets 150 - 400 K/uL 264 297 337     CMP Latest Ref Rng & Units 02/13/2021 02/08/2021 01/20/2021  Glucose 70 - 99 mg/dL 91 90 73  BUN 8 - 23 mg/dL 9 9 13   Creatinine 0.44 - 1.00 mg/dL 0.72 0.95 0.84  Sodium 135 - 145 mmol/L 142 138 137  Potassium 3.5 - 5.1 mmol/L 4.2 2.8(L) 3.9  Chloride 98 - 111 mmol/L 104 100 100  CO2 22 - 32 mmol/L 28 24 23   Calcium 8.9 - 10.3 mg/dL 9.6 9.0 9.4  Total Protein 6.5 - 8.1 g/dL 6.9 6.8 7.9  Total Bilirubin 0.3 - 1.2 mg/dL 0.7 1.6(H) 1.5(H)  Alkaline Phos 38 - 126 U/L 53 44 69  AST 15 - 41 U/L 15 22 25   ALT 0 - 44 U/L 8 9 11  EGD 01/31/2021: - No gross lesions in esophagus. Biopsied. - Widely patent Schatzki ring. Disrupted. - Dilation performed in the esophagus. Mucosal wrent noted just below the UES. - 3 cm hiatal hernia. - Gastritis in antrum. No other gross lesions in the stomach. Biopsied. - No gross lesions in the duodenal bulb, in the first portion of the duodenum and in the second portion of the duodenum. Biopsied. 1. Surgical [P], duodenal - BENIGN DUODENAL MUCOSA - NO ACUTE INFLAMMATION, VILLOUS BLUNTING OR INCREASED INTRAEPITHELIAL LYMPHOCYTES IDENTIFIED 2. Surgical [P], gastric - MILD CHRONIC GASTRITIS WITH REACTIVE CHANGES - NO H. PYLORI OR INTESTINAL  METAPLASIA IDENTIFIED - SEE COMMENT 3. Surgical [P], esophageal - BENIGN SQUAMOUS MUCOSA - NO INCREASED INTRAEPITHELIAL EOSINOPHILS 4. Surgical [P], schatzys ring - SQUAMOCOLUMNAR JUNCTION WITH CHRONIC INFLAMMATION - NO INTESTINAL METAPLASIA, DYSPLASIA OR MALIGNANCY IDENTIFIED  Chests/abd/pelvic CT with contrast 02/13/2021: 1. Interval mild growth and increased density of ill-defined peripheral right upper lobe 2.2 cm subsolid pulmonary nodule, uncertain if this represents progression of a pulmonary metastasis versus evolving post treatment change related to the adjacent treated posterior right upper lobe nodule. Suggest short-term follow-up chest CT in 3 months. 2. Additional treated bilateral pulmonary nodules are all stable with expected evolving surrounding postradiation changes. 3. No evidence of metastatic disease in the abdomen or pelvis. No acute abnormality in the abdomen or pelvis. 4. Aortic Atherosclerosis (ICD10-I70.0) and Emphysema   Cervical spine MRI 11/19/2020: IMPRESSION: This MRI of the cervical spine without contrast shows the following: 1.   Spinal cord appears normal. 2.   At C3-C4, there is mild spinal stenosis due to a left paramedian disc protrusion and uncovertebral spurring.  There is moderate left greater than right foraminal narrowing but no nerve root compression. 3.   Other levels are normal for age.   Brain MRI 11/03/2020: No evidence of recent infarction, hemorrhage, or mass. Minor chronic microvascular ischemic changes  Current Outpatient Medications on File Prior to Visit  Medication Sig Dispense Refill   alendronate (FOSAMAX) 70 MG tablet Take 70 mg by mouth once a week.     fluticasone (FLONASE) 50 MCG/ACT nasal spray Use 1-2 sprays per nostril once daily as needed for sinus congestion or allergy symptoms. 16 g 0   fluticasone (FLOVENT HFA) 110 MCG/ACT inhaler Inhale 2 puffs into the lungs 2 (two) times daily. 36 g 1   losartan (COZAAR) 25 MG  tablet Take 1 tablet (25 mg total) by mouth daily. 90 tablet 3   No current facility-administered medications on file prior to visit.   Allergies  Allergen Reactions   Banana     Lip swelling   Gabapentin Hives   Grape (Artificial) Flavor Swelling    Allergic to grapes   Lamotrigine Hives   Ibuprofen Rash   Penicillins Rash    Did it involve swelling of the face/tongue/throat, SOB, or low BP? No Did it involve sudden or severe rash/hives, skin peeling, or any reaction on the inside of your mouth or nose? No Did you need to seek medical attention at a hospital or doctor's office? No When did it last happen?      more than 10 years If all above answers are "NO", may proceed with cephalosporin use.     Current Medications, Allergies, Past Medical History, Past Surgical History, Family History and Social History were reviewed in [employer] record.  Review of Systems:   Constitutional: Negative for fever, sweats, chills or weight loss.  Respiratory: Negative for shortness of  breath.   Cardiovascular: Negative for chest pain, palpitations and leg swelling.  Gastrointestinal: See HPI.  Musculoskeletal: Negative for back pain or muscle aches.  Neurological: Negative for dizziness, headaches or paresthesias.    Physical Exam: LMP 08/21/1982  Wt Readings from Last 3 Encounters:  02/15/21 91 lb 2 oz (41.3 kg)  01/31/21 96 lb (43.5 kg)  01/23/21 96 lb 2 oz (43.6 kg)     General: 65 year old female thin in no acute distress.   Head: Normocephalic and atraumatic. Eyes: No scleral icterus. Conjunctiva pink . Ears: Normal auditory acuity. Mouth: Dentition intact. No ulcers or lesions.  Lungs: Clear throughout to auscultation. Heart: Regular rate and rhythm, no murmur. Abdomen: Soft, nontender and nondistended. No masses or hepatomegaly. Normal bowel sounds x 4 quadrants.  Rectal: Deferred. Musculoskeletal: Symmetrical with no gross deformities. Extremities:  No edema. Neurological: Alert oriented x 4. No focal deficits.  Psychological: Alert and cooperative. Normal mood and affect  Assessment and Recommendations:  7) 65 year old female with dysphagia and odynophagia, resolved s/p EGD with esophageal dilatation 01/31/2021  -Proceed with modified barium swallow study with speech pathologist to rule out oral pharyngeal dysphagia (skeletal muscle) dysphagia -Esophageal manometry deferred for now -May require repeat EGD with esophageal dilatation if dysphagia symptoms recur -Further recommendations to be determined after the above evaluation completed   2) Weigh loss secondary to # 1. CTAP 02/13/2021 without evidence of intra-abdominal/pelvic pathology to explain weight loss. Continued weight loss despite resolution of dysphagia/odynophagia with increased oral intake.  -Advised eating 3-4 small snacks sized meals, continue Ensure 2-3 bottles daily -Patient to call our office if further weight loss continues  3) Lung cancer. Chest CT identified interval mild growth and increased density of ill-defined right upper lobe nodule -Patient to follow-up with oncologist Dr.Mohamed to review chest CT 02/13/2021

## 2021-02-16 NOTE — Progress Notes (Signed)
Attending Physician's Attestation   I have reviewed the chart.   I agree with the Advanced Practitioner's note, impression, and recommendations with any updates as below.    Karrin Eisenmenger Mansouraty, MD [facility] Office # [phone]  

## 2021-02-17 ENCOUNTER — Telehealth: Payer: Self-pay | Admitting: Medical Oncology

## 2021-02-17 ENCOUNTER — Other Ambulatory Visit: Payer: Self-pay | Admitting: Surgery

## 2021-02-17 ENCOUNTER — Other Ambulatory Visit: Payer: Self-pay | Admitting: Medical Oncology

## 2021-02-17 DIAGNOSIS — Z91041 Radiographic dye allergy status: Secondary | ICD-10-CM

## 2021-02-17 MED ORDER — DIPHENHYDRAMINE HCL 50 MG PO TABS: 50.0000 mg | ORAL_TABLET | ORAL | 2 refills | Status: DC

## 2021-02-17 MED ORDER — PREDNISONE 50 MG PO TABS: ORAL_TABLET | ORAL | 2 refills | Status: DC

## 2021-02-17 NOTE — Telephone Encounter (Signed)
Pt stated she experienced  a lot of itching after her last CT with contrast.  Rx sent to pharmacy for Prednisone  and Benadryl.

## 2021-02-18 ENCOUNTER — Ambulatory Visit (HOSPITAL_COMMUNITY)
Admission: EM | Admit: 2021-02-18 | Discharge: 2021-02-18 | Disposition: A | Discharge status: Home / Self Care | Payer: Medicare Other | Attending: Family Medicine | Admitting: Family Medicine

## 2021-02-18 ENCOUNTER — Encounter (HOSPITAL_COMMUNITY): Payer: Self-pay | Admitting: Emergency Medicine

## 2021-02-18 ENCOUNTER — Other Ambulatory Visit: Payer: Self-pay

## 2021-02-18 DIAGNOSIS — R350 Frequency of micturition: Secondary | ICD-10-CM | POA: Diagnosis present

## 2021-02-18 DIAGNOSIS — N390 Urinary tract infection, site not specified: Secondary | ICD-10-CM | POA: Diagnosis present

## 2021-02-18 DIAGNOSIS — R300 Dysuria: Secondary | ICD-10-CM | POA: Insufficient documentation

## 2021-02-18 LAB — POCT URINALYSIS DIPSTICK, ED / UC
Bilirubin Urine: NEGATIVE
Glucose, UA: NEGATIVE mg/dL
Hgb urine dipstick: NEGATIVE
Ketones, ur: NEGATIVE mg/dL
Nitrite: NEGATIVE
Protein, ur: NEGATIVE mg/dL
Specific Gravity, Urine: 1.015 (ref 1.005–1.030)
Urobilinogen, UA: 0.2 mg/dL (ref 0.0–1.0)
pH: 7 (ref 5.0–8.0)

## 2021-02-18 MED ORDER — SULFAMETHOXAZOLE-TRIMETHOPRIM 800-160 MG PO TABS: 1.0000 | ORAL_TABLET | Freq: Two times a day (BID) | ORAL | 0 refills | Status: AC

## 2021-02-18 NOTE — ED Triage Notes (Signed)
Pt reports that she was seen here for UTi and given antibiotics. Reports that she wasnt able to take all the medications due to upsetting her stomach. Still having dysuria.

## 2021-02-18 NOTE — ED Provider Notes (Signed)
[place]    CSN: [id number] Arrival date & time: 02/18/21  1415      History   Chief Complaint Chief Complaint  Patient presents with   Dysuria    HPI Kimberly [name] is a 65 y.o. female.   Patient presents today with a several week history of UTI symptoms.  She was seen by our clinic on 02/03/2021 which point she was prescribed Macrobid the urine culture was not obtained.  She reports only taking Macrobid for a few days as she had significant GI upset with this medication.  She denies having improvement/resolution of symptoms prompting reevaluation today.  Continues to have dysuria, urinary frequency, urinary urgency, lower abdominal pain.  Denies hematuria, nausea, vomiting, fever, vaginal symptoms.  She denies any recent urogenital procedure, self-catheterization, history of nephrolithiasis.  She has not seen a urologist in the past.  Denies any recent antibiotic use outside of Macrobid prescribed at last visit.   Past Medical History:  Diagnosis Date   Alcohol abuse, in remission    since around 8144   Alcoholic hepatitis    fatty liver on imaging- Treated Hepatits C and treated   Allergic rhinitis    Anemia    EGD with duodenal AVM, normal colonoscopy 04/2012   Asthma    Callus of foot 2009   Cancer [facility])    Chest pain, musculoskeletal 2011   Cholelithiasis    [place] 09/2008: Cholelithiasis is present, s/p cholecystectomy   Depression    Excessive cerumen in ear canal    since before 2000   GERD (gastroesophageal reflux disease)    Hiatal hernia    History of blood transfusion    Hyperlipidemia    Hypertension    Menopausal syndrome    Treated with estradiol in the past - discontinued 04/2012   MENOPAUSAL SYNDROME 09/24/2006   2008 Note : The pt is adament about having this medication.  She fully understands the increased risk of heart disease and cancer that is associated with HRT and is willing to accept this risk in order to prevent the debilitating hot  flashes she has off this medication.  Will continue to encourage her to try to wean herself off of this medication at our next visit.  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 ([place]) 05/12/2020   Mass of upper lobe of right lung 05/03/2020   COVID-19 09/25/2019   History of dysuria 09/24/2019   Rhinosinusitis 08/27/2019   Dysuria 05/25/2019   Tachycardia 05/25/2019   Hematuria 05/25/2019   AVM (arteriovenous malformation) of small bowel, 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   Health care maintenance 03/25/2013   Depression 12/03/2012   Iron deficiency anemia 05/08/2012   Insomnia 05/07/2012   Weight loss 07/27/2009   HLD (hyperlipidemia) 03/25/2008   Constipation 11/04/2007   Essential hypertension 09/24/2006   Allergies 09/24/2006   Asthma 09/24/2006   GERD 09/24/2006    Past Surgical History:  Procedure Laterality Date   ABDOMINAL  HYSTERECTOMY  1980s   BIOPSY  03/18/2019   Procedure: BIOPSY;  Surgeon: Rush Landmark Telford Nab., MD;  Location: [place];  Service: Gastroenterology;;   BRONCHIAL BIOPSY  05/03/2020   Procedure: BRONCHIAL BIOPSIES;  Surgeon: Collene Gobble, MD;  Location: [facility] ENDOSCOPY;  Service: Pulmonary;;   BRONCHIAL BRUSHINGS  05/03/2020   Procedure: BRONCHIAL BRUSHINGS;  Surgeon: Collene Gobble, MD;  Location: [facility] ENDOSCOPY;  Service: Pulmonary;;   BRONCHIAL NEEDLE ASPIRATION BIOPSY  05/03/2020   Procedure: BRONCHIAL NEEDLE ASPIRATION BIOPSIES;  Surgeon: Collene Gobble, MD;   Location: [facility] ENDOSCOPY;  Service: Pulmonary;;   CHOLECYSTECTOMY  01/06/09   COLONOSCOPY  05/09/2012   Procedure: COLONOSCOPY;  Surgeon: Inda Castle, MD;  Location: [place];  Service: Endoscopy;  Laterality: N/A;   COLONOSCOPY WITH PROPOFOL N/A 03/18/2019   Procedure: COLONOSCOPY WITH PROPOFOL;  Surgeon: Rush Landmark Telford Nab., MD;  Location: [place];  Service: Gastroenterology;  Laterality: N/A;   ENTEROSCOPY N/A 03/18/2019   Procedure: ENTEROSCOPY;  Surgeon: Rush Landmark Telford Nab., MD;  Location: [place];  Service: Gastroenterology;  Laterality: N/A;   ESOPHAGOGASTRODUODENOSCOPY  05/09/2012   Procedure: ESOPHAGOGASTRODUODENOSCOPY (EGD);  Surgeon: Inda Castle, MD;  Location: [place];  Service: Endoscopy;  Laterality: N/A;   FIDUCIAL MARKER PLACEMENT  05/03/2020   Procedure: FIDUCIAL MARKER PLACEMENT;  Surgeon: Collene Gobble, MD;  Location: [facility] ENDOSCOPY;  Service: Pulmonary;;   HEMOSTASIS CLIP PLACEMENT  03/18/2019   Procedure: HEMOSTASIS CLIP PLACEMENT;  Surgeon: Irving Copas., MD;  Location: [place];  Service: Gastroenterology;;   HOT HEMOSTASIS N/A 03/18/2019   Procedure: HOT HEMOSTASIS (ARGON PLASMA COAGULATION/BICAP);  Surgeon: Irving Copas., MD;  Location: [place];  Service: Gastroenterology;  Laterality: N/A;   POLYPECTOMY  03/18/2019   Procedure: POLYPECTOMY;  Surgeon: Rush Landmark Telford Nab., MD;  Location: [place];  Service: Gastroenterology;;   SUBMUCOSAL TATTOO INJECTION  03/18/2019   Procedure: SUBMUCOSAL TATTOO INJECTION;  Surgeon: Irving Copas., MD;  Location: [place];  Service: Gastroenterology;;   VIDEO BRONCHOSCOPY WITH ENDOBRONCHIAL NAVIGATION N/A 05/03/2020   Procedure: VIDEO BRONCHOSCOPY WITH ENDOBRONCHIAL NAVIGATION;  Surgeon: Collene Gobble, MD;  Location: [place] ENDOSCOPY;  Service: Pulmonary;  Laterality: N/A;    OB History   No obstetric history on file.      Home Medications    Prior  to Admission medications   Medication Sig Start Date End Date Taking? Authorizing Provider  sulfamethoxazole-trimethoprim (BACTRIM DS) 800-160 MG tablet Take 1 tablet by mouth 2 (two) times daily for 7 days. 02/18/21 02/25/21 Yes Bethan Adamek, Derry Skill, PA-C  alendronate (FOSAMAX) 70 MG tablet Take 70 mg by mouth once a week. 01/02/21   [provider]  diphenhydrAMINE (BENADRYL) 50 MG tablet Take 1 tablet (50 mg total) by mouth as directed. -Take 1 tablet at 9:00am ( one hour before your CT scan).. 02/17/21   Curt Bears, MD  fluticasone [facility]) 50 MCG/ACT nasal spray Use 1-2 sprays per nostril once daily as needed for sinus congestion or allergy symptoms. 09/20/20   Jeralyn Bennett, MD  fluticasone (FLOVENT HFA) 110 MCG/ACT inhaler Inhale 2 puffs into the lungs 2 (two) times daily. 09/20/20   Jeralyn Bennett, MD  losartan (COZAAR) 25 MG tablet Take 1 tablet (25 mg total) by mouth daily. 08/10/20   Mosetta Anis, MD  predniSONE (DELTASONE) 50 MG tablet 05/11/21-Take one tablet at 9:00 PM.(13 hours before your scan.) On 05/12/21-take one tablet  at  4:00 am,( 7 hours before your scan)  and take one  tablet at  9:00 AM (one hour before you scan ). 02/17/21   Curt Bears, MD    Family History Family History  Problem Relation Age of Onset   Hypertension Mother    Colon cancer Neg Hx    Esophageal cancer Neg Hx    Inflammatory bowel disease Neg Hx    Liver disease Neg Hx    Pancreatic cancer Neg Hx    Rectal cancer Neg Hx    Stomach cancer Neg Hx     Social History Social History   Tobacco Use   Smoking status: Former    Types: Cigarettes    Quit date: 08/21/1995    Years since quitting: 25.5   Smokeless tobacco: Never   Tobacco comments:    Patient states she stopped smoking in 31 yrs ago (as of 2022)  Vaping Use   Vaping Use: Never used  Substance Use Topics   Alcohol use: No    Comment: in remission since 2007   Drug use: Not Currently    Comment: previously used marijuana       Allergies   Contrast media [iodinated diagnostic agents], Banana, Gabapentin, Grape (artificial) flavor, Lamotrigine, Ibuprofen, and Penicillins   Review of Systems Review of Systems  Constitutional:  Positive for activity change. Negative for appetite change, fatigue and fever.  Respiratory:  Negative for cough and shortness of breath.   Cardiovascular:  Negative for chest pain.  Gastrointestinal:  Positive for abdominal pain (lower abdomen). Negative for diarrhea, nausea and vomiting.  Genitourinary:  Positive for dysuria, frequency and urgency. Negative for vaginal bleeding, vaginal discharge and vaginal pain.  Musculoskeletal:  Negative for arthralgias and myalgias.  Neurological:  Negative for dizziness, light-headedness and headaches.    Physical Exam Triage Vital Signs ED Triage Vitals  Enc Vitals Group     BP 02/18/21 1444 139/83     Pulse Rate 02/18/21 1444 81     Resp 02/18/21 1444 16     Temp 02/18/21 1444 98 F (36.7 C)     Temp Source 02/18/21 1444 Oral     SpO2 02/18/21 1444 98 %     Weight --      Height --      Head Circumference --      Peak Flow --      Pain Score 02/18/21 1443 10     Pain Loc --      Pain Edu? --      Excl. in [place]? --    No data found.  Updated Vital Signs BP 139/83 (BP Location: Right Arm)   Pulse 81   Temp 98 F (36.7 C) (Oral)   Resp 16   LMP 08/21/1982   SpO2 98%   Visual Acuity Right Eye Distance:   Left Eye Distance:   Bilateral Distance:    Right Eye Near:   Left Eye Near:    Bilateral Near:     Physical Exam Vitals reviewed.  Constitutional:      General: She is awake. She is not in acute distress.    Appearance: Normal appearance. She is well-developed. She is not ill-appearing.     Comments: Very pleasant female appears stated age in no acute distress sitting comfortably in exam room  HENT:     Head: Normocephalic and atraumatic.  Cardiovascular:     Rate and Rhythm: Normal rate and regular rhythm.      Heart sounds: Normal heart sounds, S1 normal and S2 normal. No murmur heard. Pulmonary:  Effort: Pulmonary effort is normal.     Breath sounds: Normal breath sounds. No wheezing, rhonchi or rales.     Comments: Clear to auscultation bilaterally Abdominal:     General: Bowel sounds are normal.     Palpations: Abdomen is soft.     Tenderness: There is abdominal tenderness in the right lower quadrant, suprapubic area and left lower quadrant. There is no right CVA tenderness, left CVA tenderness, guarding or rebound.     Comments: Mild tenderness to palpation in lower abdomen.  No evidence of acute abdomen on physical exam.  Psychiatric:        Behavior: Behavior is cooperative.     UC Treatments / Results  Labs (all labs ordered are listed, but only abnormal results are displayed) Labs Reviewed  POCT URINALYSIS DIPSTICK, ED / UC - Abnormal; Notable for the following components:      Result Value   Leukocytes,Ua MODERATE (*)    All other components within normal limits  URINE CULTURE    EKG   Radiology No results found.  Procedures Procedures (including critical care time)  Medications Ordered in UC Medications - No data to display  Initial Impression / Assessment and Plan / UC Course  I have reviewed the triage vital signs and the nursing notes.  Pertinent labs & imaging results that were available during my care of the patient were reviewed by me and considered in my medical decision making (see chart for details).      UA showed moderate leukocyte esterase.  Given abnormal urine and clinical presentation will change antibiotics to Bactrim DS given patient's medication allergies.  No indication for dose adjustment based on CMP from 02/13/2021 with calculated creatinine clearance of 50.08 mL/min no evidence of hepatic dysfunction.  Urine culture was obtained we discussed potential need to change antibiotics based on antibiotic susceptibilities identified on culture.   She was encouraged to rest and with plenty of fluid.  Discussed alarm symptoms that warrant emergent evaluation.  Strict return precautions given to which she expressed understanding.  Final Clinical Impressions(s) / UC Diagnoses   Final diagnoses:  Lower urinary tract infectious disease  Dysuria  Urinary frequency     Discharge Instructions      We are going to treat you with a different antibiotic.  Please take this twice a day for a week.  If you develop any rash or lesions in your mouth please stop the medication and be seen immediately.  If you have any difficulty completing the course of medication please let us know so we can address this and ensure we treat your infection completely.  Make sure you are drinking plenty of fluid.  If you have any worsening symptoms including fever, persistent urinary symptoms, nausea, vomiting, lightheadedness you need to be seen immediately.  If your symptoms are just not improving I would recommend following up with a urologist.     ED Prescriptions     Medication Sig Dispense Auth. Provider   sulfamethoxazole-trimethoprim (BACTRIM DS) 800-160 MG tablet Take 1 tablet by mouth 2 (two) times daily for 7 days. 14 tablet Dacey Milberger, Derry Skill, PA-C      PDMP not reviewed this encounter.   Terrilee Croak, PA-C 02/18/21 1519

## 2021-02-18 NOTE — Discharge Instructions (Signed)
We are going to treat you with a different antibiotic.  Please take this twice a day for a week.  If you develop any rash or lesions in your mouth please stop the medication and be seen immediately.  If you have any difficulty completing the course of medication please let us know so we can address this and ensure we treat your infection completely.  Make sure you are drinking plenty of fluid.  If you have any worsening symptoms including fever, persistent urinary symptoms, nausea, vomiting, lightheadedness you need to be seen immediately.  If your symptoms are just not improving I would recommend following up with a urologist.

## 2021-02-19 LAB — URINE CULTURE

## 2021-02-20 ENCOUNTER — Ambulatory Visit (HOSPITAL_COMMUNITY)
Admission: RE | Admit: 2021-02-20 | Discharge: 2021-02-20 | Disposition: A | Discharge status: Home / Self Care | Payer: Medicare Other | Source: Ambulatory Visit | Attending: Gastroenterology | Admitting: Gastroenterology

## 2021-02-20 ENCOUNTER — Telehealth: Payer: Self-pay | Admitting: Neurology

## 2021-02-20 ENCOUNTER — Telehealth: Payer: Self-pay

## 2021-02-20 ENCOUNTER — Other Ambulatory Visit: Payer: Self-pay

## 2021-02-20 ENCOUNTER — Other Ambulatory Visit: Payer: Self-pay | Admitting: Surgery

## 2021-02-20 DIAGNOSIS — R1310 Dysphagia, unspecified: Secondary | ICD-10-CM

## 2021-02-20 DIAGNOSIS — S70352A Superficial foreign body, left thigh, initial encounter: Secondary | ICD-10-CM

## 2021-02-20 MED ORDER — DIPHENHYDRAMINE HCL 50 MG PO TABS: 50.0000 mg | ORAL_TABLET | Freq: Once | ORAL | 0 refills | Status: DC

## 2021-02-20 MED ORDER — PREDNISONE 50 MG PO TABS: ORAL_TABLET | ORAL | 0 refills | Status: DC

## 2021-02-20 NOTE — Progress Notes (Signed)
Modified Barium Swallow Progress Note  Patient Details  Name: Kimberly [name] MRN: [id number] Date of Birth: [birth year]  Today's Date: 02/20/2021  Modified Barium Swallow completed.  Full report located under Chart Review in the Imaging Section.  Brief recommendations include the following:  Clinical Impression  Pt presents with functional oropharyngeal swallow, though with some impairment in timing of airway closure, which places pt at a mild aspiration risk. As a result of delayed laryngeal vestibule closure, thin liquids via cup/straw were penetrated above the cords on majority of trials, with full ejection of material from airway (PAS 2). She expereinced x1 isolated incidence of trace penetration to the vocal cords with sip of thin taken with barium pill. Full ejection was also observed via fluoro in this instance (PAS 4). Mild residuals in pyriform sinuses were cleared via spontaneous dry swallow. Whole pill passed cervical esophagus without difficulty. Recommend regular, thin liquid diet with softer meal items as preferred by pt given absent lower dentition. Universal swallow precuations also recommended to decrease risk for aspiration (upright positioning for meals and 30 min after, small bites/sips, slow rate of intake, etc). No further SLP f/u warranted at this time.    Swallow Evaluation Recommendations       SLP Diet Recommendations: Regular solids;Thin liquid   Liquid Administration via: Cup;Straw   Medication Administration: Whole meds with liquid   Supervision: Patient able to self feed   Compensations: Minimize environmental distractions;Slow rate;Small sips/bites   Postural Changes: Remain semi-upright after after feeds/meals (Comment);Seated upright at 90 degrees   Oral Care Recommendations: Oral care BID       Ellwood Dense, [place], [place] Office Number: [phone]  Acie Fredrickson 02/20/2021,12:40 PM

## 2021-02-20 NOTE — Telephone Encounter (Signed)
Pt has called to report she will no longer take imipramine, pt states she is allergic to it and what she treats does not relate to any condition she has.

## 2021-02-20 NOTE — Telephone Encounter (Signed)
Phone call to patient to review instructions for 13 hr prep for CT w/ contrast on 03/07/21 at 1220. Prescription called into [facility]. Pt aware and verbalized understanding of instructions. Prescription:  Pt to take 50 mg of prednisone on 03/06/21 at 11:20 PM, 50 mg of prednisone on 03/07/21 at 5:20 AM, and 50 mg of prednisone on 03/07/21 at 11:20 AM. Pt is also to take 50 mg of benadryl on 03/07/21 at 11:20 AM. Please call [phone] with any questions.    Benadryl was also called in as a prescription, per the patients request. Pt advised not to drive the day of taking benadryl as this may cause drowsiness. Pt verbalized understanding.

## 2021-02-20 NOTE — Telephone Encounter (Signed)
We will note this on our end. Pt will need to schedule an appt to discuss alternative treatment options since the ones so far have not helped and have caused side effects.  The medications all though listed as a particular class of drugs research shows other studies where the medication can help with other diagnosis.  If pt calls back, please schedule for a follow up

## 2021-02-21 ENCOUNTER — Other Ambulatory Visit: Payer: Self-pay | Admitting: Student

## 2021-02-21 ENCOUNTER — Telehealth: Payer: Self-pay

## 2021-02-21 ENCOUNTER — Ambulatory Visit
Admission: RE | Admit: 2021-02-21 | Discharge: 2021-02-21 | Disposition: A | Discharge status: Home / Self Care | Payer: Medicare Other | Source: Ambulatory Visit | Attending: Surgery | Admitting: Surgery

## 2021-02-21 ENCOUNTER — Other Ambulatory Visit: Payer: Self-pay | Admitting: Surgery

## 2021-02-21 DIAGNOSIS — M795 Residual foreign body in soft tissue: Secondary | ICD-10-CM

## 2021-02-21 DIAGNOSIS — S70352A Superficial foreign body, left thigh, initial encounter: Secondary | ICD-10-CM

## 2021-02-21 DIAGNOSIS — J45909 Unspecified asthma, uncomplicated: Secondary | ICD-10-CM

## 2021-02-21 NOTE — Telephone Encounter (Signed)
Pt was scheduled to have a CT with contrast on 10/11 @ 1220. A 13 hr prep was called in for the patient and this time. However, the patient rescheduled her CT scan for 02/24/21 at 1240 pm. I called and spoke with the patient. The pt was at our [address] location at the time of the phone call. I called and spoke with Jerrye Beavers in [place] who was going to go over the patients new times and when to take her 13 hr prep. Pt has my direct phone number if she were to have any further questions.   9/29 1140 pm- 50mg  Prednisone 9/30 540 am- 50mg  Prednisone 9/30 1140 am  - 50mg  Prednisone and 50mg  Benadryl

## 2021-02-24 ENCOUNTER — Telehealth: Payer: Self-pay | Admitting: Gastroenterology

## 2021-02-24 ENCOUNTER — Inpatient Hospital Stay: Admission: RE | Admit: 2021-02-24 | Payer: Medicare Other | Source: Ambulatory Visit

## 2021-02-24 NOTE — Telephone Encounter (Signed)
Dr Rush Landmark have you reviewed the barium swallow results?

## 2021-02-24 NOTE — Telephone Encounter (Signed)
Kimberly [name], I did review the SLP note.  You can print out the swallow function test in mail it to her for her records. She had a description of a mild aspiration risk due to impaired timing of airway closure but no overt issues in regards to pill passage. No new recommendations at this time and SLP signed off. We can consider manometry findings in future if needed but based on the last evaluation with NP Kennedy-Smith I do not think that that is required. Thanks. GM

## 2021-02-24 NOTE — Telephone Encounter (Signed)
Left message on machine to call back  

## 2021-02-24 NOTE — Telephone Encounter (Signed)
Patient calling to request barium swallow test results

## 2021-02-25 ENCOUNTER — Encounter (HOSPITAL_COMMUNITY): Payer: Self-pay | Admitting: Emergency Medicine

## 2021-02-25 ENCOUNTER — Other Ambulatory Visit: Payer: Self-pay

## 2021-02-25 ENCOUNTER — Ambulatory Visit (HOSPITAL_COMMUNITY)
Admission: EM | Admit: 2021-02-25 | Discharge: 2021-02-25 | Disposition: A | Discharge status: Home / Self Care | Payer: Medicare Other | Attending: Internal Medicine | Admitting: Internal Medicine

## 2021-02-25 DIAGNOSIS — S70352A Superficial foreign body, left thigh, initial encounter: Secondary | ICD-10-CM | POA: Diagnosis not present

## 2021-02-25 MED ORDER — LIDOCAINE 5 % EX PTCH: 1.0000 | MEDICATED_PATCH | CUTANEOUS | 0 refills | Status: DC

## 2021-02-25 NOTE — ED Provider Notes (Signed)
Kimberly [name]    CSN: [id number] Arrival date & time: 02/25/21  1453      History   Chief Complaint Chief Complaint  Patient presents with   Leg Pain    HPI Kimberly [name] is a 65 y.o. female with a history of foreign body in the left eye comes to the urgent care requesting Lidoderm patch.  Patient has some pain in the left thigh.  It appears that patient has a sewing needle.  She is scheduled to have it removed soon  HPI  Past Medical History:  Diagnosis Date   Alcohol abuse, in remission    since around 9211   Alcoholic hepatitis    fatty liver on imaging- Treated Hepatits C and treated   Allergic rhinitis    Anemia    EGD with duodenal AVM, normal colonoscopy 04/2012   Asthma    Callus of foot 2009   Cancer [facility])    Chest pain, musculoskeletal 2011   Cholelithiasis    [place] 09/2008: Cholelithiasis is present, s/p cholecystectomy   Depression    Excessive cerumen in ear canal    since before 2000   GERD (gastroesophageal reflux disease)    Hiatal hernia    History of blood transfusion    Hyperlipidemia    Hypertension    Menopausal syndrome    Treated with estradiol in the past - discontinued 04/2012   MENOPAUSAL SYNDROME 09/24/2006   2008 Note : The pt is adament about having this medication.  She fully understands the increased risk of heart disease and cancer that is associated with HRT and is willing to accept this risk in order to prevent the debilitating hot flashes she has off this medication.  Will continue to encourage her to try to wean herself off of this medication at our next visit.  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 ([place]) 05/12/2020   Mass of upper lobe of right lung 05/03/2020   COVID-19 09/25/2019   History of dysuria 09/24/2019   Rhinosinusitis 08/27/2019   Dysuria 05/25/2019   Tachycardia 05/25/2019   Hematuria 05/25/2019   AVM (arteriovenous malformation) of small bowel, 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   Health care maintenance 03/25/2013   Depression 12/03/2012   Iron deficiency anemia 05/08/2012   Insomnia 05/07/2012   Weight loss 07/27/2009   HLD (hyperlipidemia) 03/25/2008   Constipation 11/04/2007   Essential hypertension 09/24/2006   Allergies 09/24/2006   Asthma 09/24/2006   GERD 09/24/2006    Past Surgical History:  Procedure Laterality Date   ABDOMINAL HYSTERECTOMY  1980s   BIOPSY  03/18/2019   Procedure: BIOPSY;  Surgeon: Irving Copas., MD;  Location: [place];  Service: Gastroenterology;;   BRONCHIAL BIOPSY  05/03/2020   Procedure: BRONCHIAL BIOPSIES;  Surgeon: Collene Gobble, MD;  Location: [facility] ENDOSCOPY;  Service: Pulmonary;;   BRONCHIAL BRUSHINGS  05/03/2020   Procedure: BRONCHIAL BRUSHINGS;  Surgeon: Collene Gobble, MD;  Location: [facility] ENDOSCOPY;  Service: Pulmonary;;   BRONCHIAL NEEDLE ASPIRATION BIOPSY  05/03/2020   Procedure: BRONCHIAL NEEDLE ASPIRATION BIOPSIES;  Surgeon: Collene Gobble, MD;  Location: [facility] ENDOSCOPY;  Service: Pulmonary;;   CHOLECYSTECTOMY  01/06/09   COLONOSCOPY  05/09/2012   Procedure: COLONOSCOPY;  Surgeon: Inda Castle, MD;  Location: [place];  Service: Endoscopy;  Laterality: N/A;   COLONOSCOPY WITH PROPOFOL N/A 03/18/2019   Procedure: COLONOSCOPY WITH PROPOFOL;  Surgeon: Rush Landmark Telford Nab., MD;  Location: [place];  Service: Gastroenterology;  Laterality: N/A;   ENTEROSCOPY N/A 03/18/2019   Procedure: ENTEROSCOPY;  Surgeon: Rush Landmark  Telford Nab., MD;  Location: [place];  Service: Gastroenterology;  Laterality: N/A;   ESOPHAGOGASTRODUODENOSCOPY  05/09/2012   Procedure: ESOPHAGOGASTRODUODENOSCOPY (EGD);  Surgeon: Inda Castle, MD;  Location: [place];  Service: Endoscopy;  Laterality: N/A;   FIDUCIAL MARKER PLACEMENT  05/03/2020   Procedure: FIDUCIAL MARKER PLACEMENT;  Surgeon: Collene Gobble, MD;  Location[facility] ENDOSCOPY;  Service: Pulmonary;;   HEMOSTASIS CLIP PLACEMENT  03/18/2019   Procedure: HEMOSTASIS CLIP PLACEMENT;  Surgeon: Irving Copas., MD;  Location: [place];  Service: Gastroenterology;;   HOT HEMOSTASIS N/A 03/18/2019   Procedure: HOT HEMOSTASIS (ARGON PLASMA COAGULATION/BICAP);  Surgeon: Irving Copas., MD;  Location: [place];  Service: Gastroenterology;  Laterality: N/A;   POLYPECTOMY  03/18/2019   Procedure: POLYPECTOMY;  Surgeon: Rush Landmark Telford Nab., MD;  Location: [place];  Service: Gastroenterology;;   SUBMUCOSAL TATTOO INJECTION  03/18/2019   Procedure: SUBMUCOSAL TATTOO INJECTION;  Surgeon: Irving Copas., MD;  Location: [place];  Service: Gastroenterology;;   VIDEO BRONCHOSCOPY WITH ENDOBRONCHIAL NAVIGATION N/A 05/03/2020   Procedure: VIDEO BRONCHOSCOPY WITH ENDOBRONCHIAL NAVIGATION;  Surgeon: Collene Gobble, MD;  Location: [place] ENDOSCOPY;  Service: Pulmonary;  Laterality: N/A;    OB History   No obstetric history on file.      Home Medications    Prior to Admission medications   Medication Sig Start Date End Date Taking? Authorizing Provider  lidocaine (LIDODERM) 5 % Place 1 patch onto the skin daily. Remove & Discard patch within 12 hours or as directed by MD 02/25/21  Yes Gregoire Bennis, Myrene Galas, MD  predniSONE (DELTASONE) 50 MG tablet Pt to take 50 mg of prednisone on 03/06/21 at 11:20 PM, 50 mg of prednisone on 03/07/21 at 5:20 AM, and 50 mg of prednisone on 03/07/21 at 11:20 AM. Pt is also to take 50 mg of benadryl on 03/07/21 at 11:20 AM.  Please call [phone] with any questions. 02/20/21   San Morelle, MD  alendronate (FOSAMAX) 70 MG tablet Take 70 mg by mouth once a week. 01/02/21   [provider]  diphenhydrAMINE (BENADRYL) 50 MG tablet Take 1 tablet (50 mg total) by mouth as directed. -Take 1 tablet at 9:00am ( one hour before your CT scan).. 02/17/21   Curt Bears, MD  fluticasone [facility]) 50 MCG/ACT nasal spray Use 1-2 sprays per nostril once daily as needed for sinus congestion or allergy symptoms. 09/20/20   Jeralyn Bennett, MD  fluticasone (FLOVENT HFA) 110 MCG/ACT inhaler Inhale 2 puffs into the lungs 2 (two) times daily. 09/20/20   Jeralyn Bennett, MD  losartan (COZAAR) 25 MG tablet Take 1 tablet (25 mg total) by mouth daily. 08/10/20   Mosetta Anis, MD  predniSONE (DELTASONE) 50 MG tablet 05/11/21-Take one tablet at 9:00 PM.(13 hours before your scan.) On 05/12/21-take one tablet  at  4:00 am,( 7 hours before your scan)  and take one tablet at  9:00 AM (one hour before you scan ). 02/17/21   Curt Bears, MD  sulfamethoxazole-trimethoprim (BACTRIM DS) 800-160 MG tablet Take 1 tablet by mouth 2 (two) times daily for 7 days. 02/18/21 02/25/21  Raspet, Derry Skill, PA-C    Family History Family History  Problem Relation Age of Onset   Hypertension Mother    Colon cancer Neg Hx    Esophageal cancer Neg Hx    Inflammatory bowel disease Neg Hx    Liver disease Neg Hx    Pancreatic cancer Neg Hx    Rectal cancer Neg Hx    Stomach cancer Neg Hx     Social History Social History   Tobacco Use   Smoking status: Former    Types: Cigarettes    Quit date: 08/21/1995    Years since quitting: 25.5   Smokeless tobacco: Never   Tobacco comments:    Patient states she stopped smoking in 31 yrs ago (as of 2022)  Vaping Use   Vaping Use: Never used  Substance Use Topics   Alcohol use: No    Comment: in remission since 2007   Drug use: Not Currently    Comment: previously used marijuana       Allergies   Contrast media [iodinated diagnostic agents], Banana, Gabapentin, Grape (artificial) flavor, Lamotrigine, Ibuprofen, and Penicillins   Review of Systems Review of Systems  Respiratory: Negative.    Genitourinary: Negative.   Skin: Negative.   Neurological: Negative.     Physical Exam Triage Vital Signs ED Triage Vitals [02/25/21 1522]  Enc Vitals Group     BP 127/78     Pulse Rate 94     Resp      Temp 97.9 F (36.6 C)     Temp Source Oral     SpO2 98 %     Weight      Height      Head Circumference      Peak Flow      Pain Score      Pain Loc      Pain Edu?      Excl. in [place]?    No data found.  Updated Vital Signs BP 127/78 (BP Location: Right Arm)   Pulse 94   Temp 97.9 F (36.6 C) (Oral)   LMP 08/21/1982   SpO2 98%   Visual Acuity Right Eye Distance:   Left Eye Distance:   Bilateral Distance:    Right Eye Near:   Left Eye Near:    Bilateral Near:     Physical Exam Vitals and nursing note reviewed.  Constitutional:      Appearance: Normal appearance.  Cardiovascular:     Rate and Rhythm: Normal rate and regular rhythm.  Pulmonary:     Effort: Pulmonary effort is normal.     Breath sounds: Normal breath sounds.  Musculoskeletal:        General: Normal range of motion.  Skin:    General: Skin is warm and dry.  Neurological:     Mental Status: She is alert.     UC Treatments / Results  Labs (all labs ordered are listed, but only abnormal results are displayed) Labs Reviewed - No data to display  EKG   Radiology No results found.  Procedures Procedures (including critical care time)  Medications Ordered in UC Medications - No data to display  Initial Impression / Assessment and Plan / UC Course  I have reviewed the triage vital signs and the nursing  notes.  Pertinent labs & imaging results that were available during my care of the patient were reviewed by me and considered in my medical decision making (see  chart for details).     1.  Foreign body in the left eye: Lidoderm patch Patient is advised to follow-up with general surgery. Final Clinical Impressions(s) / UC Diagnoses   Final diagnoses:  Foreign body of left thigh, initial encounter     Discharge Instructions      Prescriptions will be sent to the pharmacy.  Please follow-up with your surgeon for further evaluation.   ED Prescriptions     Medication Sig Dispense Auth. Provider   lidocaine (LIDODERM) 5 % Place 1 patch onto the skin daily. Remove & Discard patch within 12 hours or as directed by MD 30 patch Lillian Tigges, Myrene Galas, MD      PDMP not reviewed this encounter.   Chase Picket, MD 02/25/21 [phone]

## 2021-02-25 NOTE — ED Triage Notes (Signed)
Pt states she has a sewing needle in her left thigh for several months. She is in the process of getting it removed but needs something for pain

## 2021-02-25 NOTE — Discharge Instructions (Addendum)
Prescriptions will be sent to the pharmacy.  Please follow-up with your surgeon for further evaluation.

## 2021-02-27 NOTE — Telephone Encounter (Signed)
The pt has been advised of the report results and a copy has been mailed to the pt home

## 2021-03-02 ENCOUNTER — Other Ambulatory Visit: Payer: Self-pay

## 2021-03-02 ENCOUNTER — Encounter (HOSPITAL_COMMUNITY): Payer: Self-pay | Admitting: Emergency Medicine

## 2021-03-02 ENCOUNTER — Emergency Department (HOSPITAL_COMMUNITY)
Admission: EM | Admit: 2021-03-02 | Discharge: 2021-03-02 | Disposition: A | Discharge status: Home / Self Care | Payer: Medicare Other | Attending: Emergency Medicine | Admitting: Emergency Medicine

## 2021-03-02 ENCOUNTER — Ambulatory Visit (HOSPITAL_COMMUNITY)
Admission: EM | Admit: 2021-03-02 | Discharge: 2021-03-02 | Disposition: A | Discharge status: Home / Self Care | Payer: Medicare Other

## 2021-03-02 DIAGNOSIS — R1032 Left lower quadrant pain: Secondary | ICD-10-CM | POA: Insufficient documentation

## 2021-03-02 DIAGNOSIS — Z5321 Procedure and treatment not carried out due to patient leaving prior to being seen by health care provider: Secondary | ICD-10-CM | POA: Insufficient documentation

## 2021-03-02 NOTE — ED Triage Notes (Signed)
Pt is present today with a lump on the inner left thigh. Pt states that she recently had it x rayed tuesday. No findings. Pt states that she is still experiencing pain.

## 2021-03-02 NOTE — ED Triage Notes (Signed)
Pt in with pain to L thigh, states she feels swelling and knot present. Has had xrays and been evaluated by doc. Pt thinks she has a sewing needle in the knot area. A&ox4

## 2021-03-02 NOTE — ED Notes (Signed)
Pt states that she spoke with her PCP and there was no reason for another x ray to be done. Per patient she states that she does not need to be seen.

## 2021-03-07 ENCOUNTER — Other Ambulatory Visit: Payer: Medicare Other

## 2021-03-07 ENCOUNTER — Other Ambulatory Visit: Payer: Self-pay

## 2021-03-07 ENCOUNTER — Ambulatory Visit (HOSPITAL_COMMUNITY)
Admission: EM | Admit: 2021-03-07 | Discharge: 2021-03-07 | Disposition: A | Discharge status: Home / Self Care | Payer: Medicare Other | Attending: Urgent Care | Admitting: Urgent Care

## 2021-03-07 ENCOUNTER — Inpatient Hospital Stay: Admission: RE | Admit: 2021-03-07 | Payer: Medicare Other | Source: Ambulatory Visit

## 2021-03-07 ENCOUNTER — Encounter (HOSPITAL_COMMUNITY): Payer: Self-pay | Admitting: Emergency Medicine

## 2021-03-07 DIAGNOSIS — J3089 Other allergic rhinitis: Secondary | ICD-10-CM

## 2021-03-07 DIAGNOSIS — R0981 Nasal congestion: Secondary | ICD-10-CM

## 2021-03-07 DIAGNOSIS — J0101 Acute recurrent maxillary sinusitis: Secondary | ICD-10-CM | POA: Diagnosis not present

## 2021-03-07 MED ORDER — DOXYCYCLINE HYCLATE 100 MG PO CAPS: 100.0000 mg | ORAL_CAPSULE | Freq: Two times a day (BID) | ORAL | 0 refills | Status: DC

## 2021-03-07 MED ORDER — PREDNISONE 20 MG PO TABS: ORAL_TABLET | ORAL | 0 refills | Status: DC

## 2021-03-07 MED ORDER — CETIRIZINE HCL 10 MG PO TABS: 10.0000 mg | ORAL_TABLET | Freq: Every day | ORAL | 0 refills | Status: DC

## 2021-03-07 NOTE — ED Provider Notes (Signed)
[place]   MRN: [id number] DOB: 07-15-1955  Subjective:   Kimberly [name] is a 65 y.o. female presenting for 2 week history of acute onset recurrent sinus congestion, left-sided facial pain and facial pressure.  Has a history of allergic rhinitis, has been using Flonase without any relief.  No cough, chest pain, shortness of breath, wheezing, nausea, vomiting, abdominal pain.  She does have a history of asthma as well but does not feel like that is causing her any issues now.  Also has a history of COPD.  No history of stroke. No neck pain, neck stiffness.  No current facility-administered medications for this encounter.  Current Outpatient Medications:    predniSONE (DELTASONE) 50 MG tablet, Pt to take 50 mg of prednisone on 03/06/21 at 11:20 PM, 50 mg of prednisone on 03/07/21 at 5:20 AM, and 50 mg of prednisone on 03/07/21 at 11:20 AM. Pt is also to take 50 mg of benadryl on 03/07/21 at 11:20 AM. Please call [phone] with any questions., Disp: 3 tablet, Rfl: 0   alendronate (FOSAMAX) 70 MG tablet, Take 70 mg by mouth once a week., Disp: , Rfl:    diphenhydrAMINE (BENADRYL) 50 MG tablet, Take 1 tablet (50 mg total) by mouth as directed. -Take 1 tablet at 9:00am ( one hour before your CT scan).., Disp: 1 tablet, Rfl: 2   fluticasone (FLONASE) 50 MCG/ACT nasal spray, Use 1-2 sprays per nostril once daily as needed for sinus congestion or allergy symptoms., Disp: 16 g, Rfl: 0   fluticasone (FLOVENT HFA) 110 MCG/ACT inhaler, Inhale 2 puffs into the lungs 2 (two) times daily., Disp: 36 g, Rfl: 1   lidocaine (LIDODERM) 5 %, Place 1 patch onto the skin daily. Remove & Discard patch within 12 hours or as directed by MD, Disp: 30 patch, Rfl: 0   losartan (COZAAR) 25 MG tablet, Take 1 tablet (25 mg total) by mouth daily., Disp: 90 tablet, Rfl: 3   predniSONE (DELTASONE) 50 MG tablet, 05/11/21-Take one tablet at 9:00 PM.(13 hours before your scan.) On 05/12/21-take one tablet  at   4:00 am,( 7 hours before your scan)  and take one tablet at  9:00 AM (one hour before you scan )., Disp: 3 tablet, Rfl: 2   Allergies  Allergen Reactions   Contrast Media [Iodinated Diagnostic Agents] Itching   Banana     Lip swelling   Gabapentin Hives   Grape (Artificial) Flavor Swelling    Allergic to grapes   Lamotrigine Hives   Ibuprofen Rash   Penicillins Rash    Did it involve swelling of the face/tongue/throat, SOB, or low BP? No Did it involve sudden or severe rash/hives, skin peeling, or any reaction on the inside of your mouth or nose? No Did you need to seek medical attention at a hospital or doctor's office? No When did it last happen?      more than 10 years If all above answers are "NO", may proceed with cephalosporin use.     Past Medical History:  Diagnosis Date   Alcohol abuse, in remission    since around 1324   Alcoholic hepatitis    fatty liver on imaging- Treated Hepatits C and treated   Allergic rhinitis    Anemia    EGD with duodenal AVM, normal colonoscopy 04/2012   Asthma    Callus of foot 2009   Cancer [facility])    Chest pain, musculoskeletal 2011   Cholelithiasis    [place]  09/2008: Cholelithiasis is present, s/p cholecystectomy   Depression    Excessive cerumen in ear canal    since before 2000   GERD (gastroesophageal reflux disease)    Hiatal hernia    History of blood transfusion    Hyperlipidemia    Hypertension    Menopausal syndrome    Treated with estradiol in the past - discontinued 04/2012   MENOPAUSAL SYNDROME 09/24/2006   2008 Note : The pt is adament about having this medication.  She fully understands the increased risk of heart disease and cancer that is associated with HRT and is willing to accept this risk in order to prevent the debilitating hot flashes she has off this medication.  Will continue to encourage her to try to wean herself off of this medication at our next visit.  2009 note indicated that she tr   Otitis externa, acute  06/2010   bilateral, treated with azithromycin after failure of neomycin drops   Otitis media, acute 06/2010   PONV (postoperative nausea and vomiting)      Past Surgical History:  Procedure Laterality Date   ABDOMINAL HYSTERECTOMY  1980s   BIOPSY  03/18/2019   Procedure: BIOPSY;  Surgeon: Irving Copas., MD;  Location: [place];  Service: Gastroenterology;;   BRONCHIAL BIOPSY  05/03/2020   Procedure: BRONCHIAL BIOPSIES;  Surgeon: Collene Gobble, MD;  Location: [facility] ENDOSCOPY;  Service: Pulmonary;;   BRONCHIAL BRUSHINGS  05/03/2020   Procedure: BRONCHIAL BRUSHINGS;  Surgeon: Collene Gobble, MD;  Location: [facility] ENDOSCOPY;  Service: Pulmonary;;   BRONCHIAL NEEDLE ASPIRATION BIOPSY  05/03/2020   Procedure: BRONCHIAL NEEDLE ASPIRATION BIOPSIES;  Surgeon: Collene Gobble, MD;  Location: [facility] ENDOSCOPY;  Service: Pulmonary;;   CHOLECYSTECTOMY  01/06/09   COLONOSCOPY  05/09/2012   Procedure: COLONOSCOPY;  Surgeon: Inda Castle, MD;  Location: [facility] ENDOSCOPY;  Service: Endoscopy;  Laterality: N/A;   COLONOSCOPY WITH PROPOFOL N/A 03/18/2019   Procedure: COLONOSCOPY WITH PROPOFOL;  Surgeon: Rush Landmark Telford Nab., MD;  Location: [place];  Service: Gastroenterology;  Laterality: N/A;   ENTEROSCOPY N/A 03/18/2019   Procedure: ENTEROSCOPY;  Surgeon: Rush Landmark Telford Nab., MD;  Location: [place];  Service: Gastroenterology;  Laterality: N/A;   ESOPHAGOGASTRODUODENOSCOPY  05/09/2012   Procedure: ESOPHAGOGASTRODUODENOSCOPY (EGD);  Surgeon: Inda Castle, MD;  Location: [place];  Service: Endoscopy;  Laterality: N/A;   FIDUCIAL MARKER PLACEMENT  05/03/2020   Procedure: FIDUCIAL MARKER PLACEMENT;  Surgeon: Collene Gobble, MD;  Location: [facility] ENDOSCOPY;  Service: Pulmonary;;   HEMOSTASIS CLIP PLACEMENT  03/18/2019   Procedure: HEMOSTASIS CLIP PLACEMENT;  Surgeon: Irving Copas., MD;  Location: [place];  Service: Gastroenterology;;   HOT HEMOSTASIS N/A 03/18/2019    Procedure: HOT HEMOSTASIS (ARGON PLASMA COAGULATION/BICAP);  Surgeon: Irving Copas., MD;  Location: [place];  Service: Gastroenterology;  Laterality: N/A;   POLYPECTOMY  03/18/2019   Procedure: POLYPECTOMY;  Surgeon: Rush Landmark Telford Nab., MD;  Location: [place];  Service: Gastroenterology;;   SUBMUCOSAL TATTOO INJECTION  03/18/2019   Procedure: SUBMUCOSAL TATTOO INJECTION;  Surgeon: Irving Copas., MD;  Location: [place];  Service: Gastroenterology;;   VIDEO BRONCHOSCOPY WITH ENDOBRONCHIAL NAVIGATION N/A 05/03/2020   Procedure: VIDEO BRONCHOSCOPY WITH ENDOBRONCHIAL NAVIGATION;  Surgeon: Collene Gobble, MD;  Location: [place] ENDOSCOPY;  Service: Pulmonary;  Laterality: N/A;    Family History  Problem Relation Age of Onset   Hypertension Mother    Colon cancer Neg Hx    Esophageal cancer Neg Hx  Inflammatory bowel disease Neg Hx    Liver disease Neg Hx    Pancreatic cancer Neg Hx    Rectal cancer Neg Hx    Stomach cancer Neg Hx     Social History   Tobacco Use   Smoking status: Former    Types: Cigarettes    Quit date: 08/21/1995    Years since quitting: 25.5   Smokeless tobacco: Never   Tobacco comments:    Patient states she stopped smoking in 31 yrs ago (as of 2022)  Vaping Use   Vaping Use: Never used  Substance Use Topics   Alcohol use: No    Comment: in remission since 2007   Drug use: Not Currently    Comment: previously used marijuana     ROS   Objective:   Vitals: BP (!) 150/83 (BP Location: Left Arm)   Pulse 78   Temp 98.8 F (37.1 C) (Oral)   Resp 18   LMP 08/21/1982   SpO2 98%   Physical Exam Constitutional:      General: She is not in acute distress.    Appearance: Normal appearance. She is well-developed. She is not ill-appearing, toxic-appearing or diaphoretic.  HENT:     Head: Normocephalic and atraumatic.     Right Ear: Tympanic membrane and ear canal normal. No drainage or tenderness. No middle ear effusion.  Tympanic membrane is not erythematous.     Left Ear: Tympanic membrane and ear canal normal. No drainage or tenderness.  No middle ear effusion. Tympanic membrane is not erythematous.     Nose: Congestion present. No rhinorrhea.     Comments: Left-sided maxillary sinus tenderness.  Left frontal sinus tenderness.    Mouth/Throat:     Mouth: Mucous membranes are moist. No oral lesions.     Pharynx: Oropharynx is clear. No pharyngeal swelling, oropharyngeal exudate, posterior oropharyngeal erythema or uvula swelling.     Tonsils: No tonsillar exudate or tonsillar abscesses.  Eyes:     General: No scleral icterus.       Right eye: No discharge.        Left eye: No discharge.     Extraocular Movements: Extraocular movements intact.     Right eye: Normal extraocular motion.     Left eye: Normal extraocular motion.     Conjunctiva/sclera: Conjunctivae normal.     Pupils: Pupils are equal, round, and reactive to light.  Cardiovascular:     Rate and Rhythm: Normal rate and regular rhythm.     Pulses: Normal pulses.     Heart sounds: Normal heart sounds. No murmur heard.   No friction rub. No gallop.  Pulmonary:     Effort: Pulmonary effort is normal. No respiratory distress.     Breath sounds: Normal breath sounds. No stridor. No wheezing, rhonchi or rales.  Musculoskeletal:     Cervical back: Normal range of motion and neck supple.  Lymphadenopathy:     Cervical: No cervical adenopathy.  Skin:    General: Skin is warm and dry.     Findings: No rash.  Neurological:     General: No focal deficit present.     Mental Status: She is alert and oriented to person, place, and time.     Cranial Nerves: No cranial nerve deficit.     Motor: No weakness.     Coordination: Coordination normal.     Gait: Gait normal.     Deep Tendon Reflexes: Reflexes normal.     Comments: Ambulates with  a cane.  No facial asymmetry.  Negative Romberg and pronator drift.  Psychiatric:        Mood and Affect: Mood  normal.        Behavior: Behavior normal.        Thought Content: Thought content normal.        Judgment: Judgment normal.    Assessment and Plan :   PDMP not reviewed this encounter.  1. Allergic rhinitis due to other allergic trigger, unspecified seasonality   2. Nasal congestion   3. Acute recurrent maxillary sinusitis     Primary issue is her allergic rhinitis, hold Flonase, use prednisone, start Zyrtec.  Will address secondary sinusitis with doxycycline given her penicillin allergies.  Recommended supportive care otherwise.  No signs of stroke, acute encephalopathy. Counseled patient on potential for adverse effects with medications prescribed/recommended today, ER and return-to-clinic precautions discussed, patient verbalized understanding.    Jaynee Eagles, PA-C 03/07/21 1151

## 2021-03-07 NOTE — ED Triage Notes (Signed)
C.o congestion for  while. Having sinus pressure on left side of face.

## 2021-03-08 ENCOUNTER — Ambulatory Visit: Payer: Medicare Other | Admitting: Gastroenterology

## 2021-03-08 ENCOUNTER — Encounter: Payer: Medicare Other | Admitting: Neurology

## 2021-03-12 ENCOUNTER — Other Ambulatory Visit: Payer: Self-pay

## 2021-03-12 ENCOUNTER — Encounter (HOSPITAL_COMMUNITY): Payer: Self-pay | Admitting: *Deleted

## 2021-03-12 ENCOUNTER — Ambulatory Visit (HOSPITAL_COMMUNITY)
Admission: EM | Admit: 2021-03-12 | Discharge: 2021-03-12 | Discharge status: Against Medical Advice | Payer: Medicare Other

## 2021-03-12 NOTE — ED Triage Notes (Signed)
Pt reports muscle pain that started 2weeks ago after lifting heavy object.

## 2021-03-13 ENCOUNTER — Ambulatory Visit (INDEPENDENT_AMBULATORY_CARE_PROVIDER_SITE_OTHER): Payer: Medicare Other

## 2021-03-13 ENCOUNTER — Encounter (HOSPITAL_COMMUNITY): Payer: Self-pay | Admitting: Emergency Medicine

## 2021-03-13 ENCOUNTER — Ambulatory Visit (HOSPITAL_COMMUNITY)
Admission: EM | Admit: 2021-03-13 | Discharge: 2021-03-13 | Disposition: A | Discharge status: Home / Self Care | Payer: Medicare Other | Attending: Sports Medicine | Admitting: Sports Medicine

## 2021-03-13 ENCOUNTER — Other Ambulatory Visit: Payer: Self-pay

## 2021-03-13 DIAGNOSIS — Z85118 Personal history of other malignant neoplasm of bronchus and lung: Secondary | ICD-10-CM | POA: Diagnosis not present

## 2021-03-13 DIAGNOSIS — R0789 Other chest pain: Secondary | ICD-10-CM

## 2021-03-13 DIAGNOSIS — S29011A Strain of muscle and tendon of front wall of thorax, initial encounter: Secondary | ICD-10-CM

## 2021-03-13 DIAGNOSIS — R911 Solitary pulmonary nodule: Secondary | ICD-10-CM | POA: Diagnosis not present

## 2021-03-13 MED ORDER — LIDOCAINE 5 % EX PTCH: 1.0000 | MEDICATED_PATCH | CUTANEOUS | 0 refills | Status: DC

## 2021-03-13 MED ORDER — ZIKS ARTHRITIS PAIN RELIEF 0.025-1-12 % EX CREA: 1.0000 | TOPICAL_CREAM | Freq: Two times a day (BID) | CUTANEOUS | 1 refills | Status: DC

## 2021-03-13 NOTE — Discharge Instructions (Addendum)
Muscle strain of pectoralis muscles  - Apply Capsaicin cream to painful area 2x daily; gentle massage to area - You may also try the lidocaine patch, but do not use within 1 hour of capsaicin cream - Apply heat to area - Begin home exercises to help improve pain and function of your right chest/shoulder, see handout

## 2021-03-13 NOTE — ED Provider Notes (Signed)
[place]    CSN: [id number] Arrival date & time: 03/13/21  1107      History   Chief Complaint Chief Complaint  Patient presents with   Arm Injury    "Pulled muscle right arm"    HPI Kimberly [name] is a 65 y.o. female here for right chest wall/arm pain.  Patient states that a few weeks-month ago she was attempting to pick up a heavy bag of potatoes at the grocery store and lift it up when she felt a pain in her right upper chest and shoulder. She had pain but was able to carry on with daily activities. She has tried ice/heat to the area, but it has not gotten much better. Denies any radiation of pain into the right upper extremity. No numbness/tingling or weakness. She denies any midline pain, no chest pain or shortness of breath. Her pain is little at rest, but is worse with activity, specifically reaching out or trying to pick up items with her right hand. She is right handed. She denies any redness, swelling, or ecchymosis of the area. Denies any pain on the top of the shoulder. NO prior injury that she knows of. She cannot tolerate NSAIDs orally. She denies any fever/chills.   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   Upon chart review, patient did have a CT-chest/abd/pelvis on 02/13/21 that demonstrates:  "IMPRESSION: 1. Interval mild growth and increased density of ill-defined peripheral right upper lobe 2.2 cm subsolid pulmonary nodule, uncertain if this represents progression of a pulmonary metastasis versus evolving post treatment change related to the adjacent treated posterior right upper lobe nodule. Suggest short-term follow-up chest CT in 3 months. 2. Additional treated bilateral pulmonary nodules are all stable with expected evolving surrounding postradiation changes. 3. No evidence of metastatic disease in the abdomen or pelvis. No acute abnormality in the abdomen or  pelvis. 4. Aortic Atherosclerosis (ICD10-I70.0) and Emphysema (ICD10-J43.9)."  Past Medical History:  Diagnosis Date   Alcohol abuse, in remission    since around 8756   Alcoholic hepatitis    fatty liver on imaging- Treated Hepatits C and treated   Allergic rhinitis    Anemia    EGD with duodenal AVM, normal colonoscopy 04/2012   Asthma    Callus of foot 2009   Cancer [facility])    Chest pain, musculoskeletal 2011   Cholelithiasis    [place] 09/2008: Cholelithiasis is present, s/p cholecystectomy   Depression    Excessive cerumen in ear canal    since before 2000   GERD (gastroesophageal reflux disease)    Hiatal hernia    History of blood transfusion    Hyperlipidemia    Hypertension    Menopausal syndrome    Treated with estradiol in the past - discontinued 04/2012   MENOPAUSAL SYNDROME 09/24/2006   2008 Note : The pt is adament about having this medication.  She fully understands the increased risk of heart disease and cancer that is associated with HRT and is willing to accept this risk in order to prevent the debilitating hot flashes she has off this medication.  Will continue to encourage her to try to wean herself off of this medication at our next visit.  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 ([place]) 05/12/2020   Mass of upper lobe of right lung 05/03/2020   COVID-19 09/25/2019   History of dysuria 09/24/2019   Rhinosinusitis 08/27/2019   Dysuria 05/25/2019   Tachycardia  05/25/2019   Hematuria 05/25/2019   AVM (arteriovenous malformation) of small bowel, 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   Health care maintenance 03/25/2013   Depression 12/03/2012   Iron deficiency anemia 05/08/2012   Insomnia 05/07/2012   Weight loss 07/27/2009   HLD (hyperlipidemia) 03/25/2008   Constipation 11/04/2007   Essential hypertension 09/24/2006   Allergies 09/24/2006   Asthma 09/24/2006   GERD 09/24/2006    Past Surgical History:  Procedure Laterality Date   ABDOMINAL HYSTERECTOMY  1980s   BIOPSY  03/18/2019   Procedure: BIOPSY;  Surgeon: Irving Copas., MD;  Location: [place];  Service: Gastroenterology;;   BRONCHIAL BIOPSY  05/03/2020   Procedure: BRONCHIAL BIOPSIES;  Surgeon: Collene Gobble, MD;  Location[facility] ENDOSCOPY;  Service: Pulmonary;;   BRONCHIAL BRUSHINGS  05/03/2020   Procedure: BRONCHIAL BRUSHINGS;  Surgeon: Collene Gobble, MD;  Location: [facility] ENDOSCOPY;  Service: Pulmonary;;   BRONCHIAL NEEDLE ASPIRATION BIOPSY  05/03/2020   Procedure: BRONCHIAL NEEDLE ASPIRATION BIOPSIES;  Surgeon: Collene Gobble, MD;  Location: [facility] ENDOSCOPY;  Service: Pulmonary;;   CHOLECYSTECTOMY  01/06/09   COLONOSCOPY  05/09/2012   Procedure: COLONOSCOPY;  Surgeon: Inda Castle, MD;  Location: [facility] ENDOSCOPY;  Service: Endoscopy;  Laterality: N/A;   COLONOSCOPY WITH PROPOFOL N/A 03/18/2019   Procedure: COLONOSCOPY WITH PROPOFOL;  Surgeon: Rush Landmark Telford Nab., MD;  Location: [place];  Service: Gastroenterology;  Laterality: N/A;   ENTEROSCOPY N/A 03/18/2019   Procedure: ENTEROSCOPY;  Surgeon: Rush Landmark Telford Nab., MD;  Location: [place];  Service: Gastroenterology;  Laterality: N/A;   ESOPHAGOGASTRODUODENOSCOPY  05/09/2012   Procedure: ESOPHAGOGASTRODUODENOSCOPY (EGD);  Surgeon: Inda Castle, MD;  Location: [place];  Service: Endoscopy;  Laterality: N/A;   FIDUCIAL MARKER PLACEMENT  05/03/2020   Procedure: FIDUCIAL MARKER PLACEMENT;  Surgeon: Collene Gobble,  MD;  Location: [facility] ENDOSCOPY;  Service: Pulmonary;;   HEMOSTASIS CLIP PLACEMENT  03/18/2019   Procedure: HEMOSTASIS CLIP PLACEMENT;  Surgeon: Irving Copas., MD;  Location: [place];  Service: Gastroenterology;;   HOT HEMOSTASIS N/A 03/18/2019   Procedure: HOT HEMOSTASIS (ARGON PLASMA COAGULATION/BICAP);  Surgeon: Irving Copas., MD;  Location: [place];  Service: Gastroenterology;  Laterality: N/A;   POLYPECTOMY  03/18/2019   Procedure: POLYPECTOMY;  Surgeon: Rush Landmark Telford Nab., MD;  Location: [place];  Service: Gastroenterology;;   SUBMUCOSAL TATTOO INJECTION  03/18/2019   Procedure: SUBMUCOSAL TATTOO INJECTION;  Surgeon: Irving Copas., MD;  Location: [place];  Service: Gastroenterology;;   VIDEO BRONCHOSCOPY WITH ENDOBRONCHIAL NAVIGATION N/A 05/03/2020   Procedure: VIDEO BRONCHOSCOPY WITH ENDOBRONCHIAL NAVIGATION;  Surgeon: Collene Gobble, MD;  Location: [place] ENDOSCOPY;  Service: Pulmonary;  Laterality: N/A;    OB History   No obstetric history on file.      Home Medications    Prior to Admission medications   Medication Sig Start Date End Date Taking? Authorizing Provider  Capsaicin-Menthol-Methyl Sal (CAPSAICIN-METHYL SAL-MENTHOL) 0.025-1-12 % CREA Apply  1 each topically in the morning and at bedtime. 03/13/21  Yes Elba Barman, DO  alendronate (FOSAMAX) 70 MG tablet Take 70 mg by mouth once a week. 01/02/21   [provider]  cetirizine (ZYRTEC ALLERGY) 10 MG tablet Take 1 tablet (10 mg total) by mouth daily. 03/07/21   Jaynee Eagles, PA-C  diphenhydrAMINE (BENADRYL) 50 MG tablet Take 1 tablet (50 mg total) by mouth as directed. -Take 1 tablet at 9:00am ( one hour before your CT scan).. 02/17/21   Curt Bears, MD  doxycycline (VIBRAMYCIN) 100 MG capsule Take 1 capsule (100 mg total) by mouth 2 (two) times daily. 03/07/21   Jaynee Eagles, PA-C  fluticasone (FLONASE) 50 MCG/ACT nasal spray Use 1-2 sprays per nostril once daily as  needed for sinus congestion or allergy symptoms. 09/20/20   Jeralyn Bennett, MD  fluticasone (FLOVENT HFA) 110 MCG/ACT inhaler Inhale 2 puffs into the lungs 2 (two) times daily. 09/20/20   Jeralyn Bennett, MD  lidocaine (LIDODERM) 5 % Place 1 patch onto the skin daily. Remove & Discard patch within 12 hours or as directed by MD 03/13/21   Elba Barman, DO  losartan (COZAAR) 25 MG tablet Take 1 tablet (25 mg total) by mouth daily. 08/10/20   Mosetta Anis, MD  predniSONE (DELTASONE) 20 MG tablet Take 2 tablets daily with breakfast. 03/07/21   Jaynee Eagles, PA-C    Family History Family History  Problem Relation Age of Onset   Hypertension Mother    Colon cancer Neg Hx    Esophageal cancer Neg Hx    Inflammatory bowel disease Neg Hx    Liver disease Neg Hx    Pancreatic cancer Neg Hx    Rectal cancer Neg Hx    Stomach cancer Neg Hx     Social History Social History   Tobacco Use   Smoking status: Former    Types: Cigarettes    Quit date: 08/21/1995    Years since quitting: 25.5   Smokeless tobacco: Never   Tobacco comments:    Patient states she stopped smoking in 31 yrs ago (as of 2022)  Vaping Use   Vaping Use: Never used  Substance Use Topics   Alcohol use: No    Comment: in remission since 2007   Drug use: Not Currently    Comment: previously used marijuana      Allergies   Contrast media [iodinated diagnostic agents], Banana, Gabapentin, Grape (artificial) flavor, Lamotrigine, Ibuprofen, and Penicillins   Review of Systems Review of Systems  Constitutional:  Negative for chills and fever.  Respiratory:  Negative for cough, chest tightness and shortness of breath.   Cardiovascular:  Negative for chest pain.  Musculoskeletal:  Negative for neck pain.       + pain of right pectoralis area, just beneath clavicle   Skin:  Negative for pallor, rash and wound.    Physical Exam Triage Vital Signs ED Triage Vitals [03/13/21 1435]  Enc Vitals Group     BP (!) 167/84      Pulse Rate 76     Resp 16     Temp 97.7 F (36.5 C)     Temp Source Oral     SpO2 98 %     Weight      Height      Head Circumference      Peak Flow      Pain Score 6     Pain Loc      Pain Edu?  Excl. in [place]?    No data found.  Updated Vital Signs BP (!) 167/84   Pulse 76   Temp 97.7 F (36.5 C) (Oral)   Resp 16   LMP 08/21/1982   SpO2 98%   Visual Acuity Right Eye Distance:   Left Eye Distance:   Bilateral Distance:    Right Eye Near:   Left Eye Near:    Bilateral Near:     Physical Exam Constitutional:      Appearance: Normal appearance. She is not ill-appearing.  HENT:     Head: Normocephalic and atraumatic.  Eyes:     Extraocular Movements: Extraocular movements intact.     Pupils: Pupils are equal, round, and reactive to light.  Cardiovascular:     Rate and Rhythm: Normal rate.  Pulmonary:     Effort: Pulmonary effort is normal.     Breath sounds: No wheezing or rales.  Abdominal:     General: Abdomen is flat.     Palpations: Abdomen is soft.  Musculoskeletal:     Comments: + TTP over right pectoralis muscle; + TTP over right anterior rib 4-5. Pain is worse with resisted flexion of the RUE. No TTP over right acromion, shoulder, or AC joint. Int/Ext rotation wnl and equivocal for b/l Ue's.   Skin:    General: Skin is warm.  Neurological:     Mental Status: She is alert.  Psychiatric:        Mood and Affect: Mood normal.        Thought Content: Thought content normal.     UC Treatments / Results  Labs (all labs ordered are listed, but only abnormal results are displayed) Labs Reviewed - No data to display  EKG   Radiology DG Chest 2 View  Result Date: 03/13/2021 CLINICAL DATA:  Right chest wall and rib pain for 3 weeks. EXAM: CHEST - 2 VIEW COMPARISON:  02/02/2021 and CT chest 02/13/2021. FINDINGS: Trachea is midline. Heart size normal. Lungs are emphysematous. Nodular densities in the upper lung zones correspond to those seen on  02/13/2021. No airspace consolidation or pleural fluid. IMPRESSION: 1. No acute findings. 2. Nodular densities in the upper lung zones, better evaluated on 02/13/2021. Electronically Signed   By: Lorin Picket M.D.   On: 03/13/2021 15:53    Procedures Procedures (including critical care time)  Medications Ordered in UC Medications - No data to display  Initial Impression / Assessment and Plan / UC Course  I have reviewed the triage vital signs and the nursing notes.  Pertinent labs & imaging results that were available during my care of the patient were reviewed by me and considered in my medical decision making (see chart for details).     Right chest wall/costal cage pain History of lung cancer Pulmonary nodules  Patient presents with few weeks-month of right pectoralis/chest wall pain after picking up heavy bag of potatoes. She has TTP throughout pectoralis major muscle belly. She also has a history of lung cancer with right-bronchial lung nodules, but she is following up with Oncology. States she had a CT-scan a few weeks ago, and has follow-up in 3 months. X-ray today redemonstrates lung nodules, but no other acute bony fracture or abnormality. For her muscle pain, she may apply heat; I sent in capsaicin cream and lidocaine patches for her to apply over painful area. She may also take tylenol. I discussed trial of gabapentin as we discussed this could be radiation-related pain as well, but she had  tried this in the past without relief. She is aware she needs to continue to follow-up with oncology and her PCP for her prior malignancy treatment. I provided with her home exericses/PT for her pectoralis strain. If she desires formal PT, she would need f/u with her PCP. Return precautions provided.  Final Clinical Impressions(s) / UC Diagnoses   Final diagnoses:  Pectoralis muscle strain, initial encounter  History of lung cancer  Lung nodule seen on imaging study     Discharge  Instructions      Muscle strain of pectoralis muscles  - Apply Capsaicin cream to painful area 2x daily; gentle massage to area - You may also try the lidocaine patch, but do not use within 1 hour of capsaicin cream - Apply heat to area - Begin home exercises to help improve pain and function of your right chest/shoulder, see handout     ED Prescriptions     Medication Sig Dispense Auth. Provider   lidocaine (LIDODERM) 5 % Place 1 patch onto the skin daily. Remove & Discard patch within 12 hours or as directed by MD 30 patch Elba Barman, DO   Capsaicin-Menthol-Methyl Sal (CAPSAICIN-METHYL SAL-MENTHOL) 0.025-1-12 % CREA Apply 1 each topically in the morning and at bedtime. 61 g Elba Barman, DO      PDMP not reviewed this encounter.   Elba Barman, DO 03/13/21 1617

## 2021-03-13 NOTE — ED Triage Notes (Signed)
PT believes she pulled a muscle in right arm / shoulder when she lifted a bag of potatoes.

## 2021-03-14 ENCOUNTER — Emergency Department (HOSPITAL_COMMUNITY): Payer: Medicare Other

## 2021-03-14 ENCOUNTER — Emergency Department (HOSPITAL_COMMUNITY)
Admission: EM | Admit: 2021-03-14 | Discharge: 2021-03-14 | Disposition: A | Discharge status: Home / Self Care | Payer: Medicare Other | Attending: Emergency Medicine | Admitting: Emergency Medicine

## 2021-03-14 DIAGNOSIS — H53149 Visual discomfort, unspecified: Secondary | ICD-10-CM | POA: Insufficient documentation

## 2021-03-14 DIAGNOSIS — R519 Headache, unspecified: Secondary | ICD-10-CM | POA: Insufficient documentation

## 2021-03-14 DIAGNOSIS — Z5321 Procedure and treatment not carried out due to patient leaving prior to being seen by health care provider: Secondary | ICD-10-CM | POA: Insufficient documentation

## 2021-03-14 DIAGNOSIS — R200 Anesthesia of skin: Secondary | ICD-10-CM | POA: Diagnosis not present

## 2021-03-14 DIAGNOSIS — Z85828 Personal history of other malignant neoplasm of skin: Secondary | ICD-10-CM | POA: Diagnosis not present

## 2021-03-14 LAB — BASIC METABOLIC PANEL
Anion gap: 6 (ref 5–15)
BUN: 10 mg/dL (ref 8–23)
CO2: 30 mmol/L (ref 22–32)
Calcium: 8.6 mg/dL — ABNORMAL LOW (ref 8.9–10.3)
Chloride: 103 mmol/L (ref 98–111)
Creatinine, Ser: 0.75 mg/dL (ref 0.44–1.00)
GFR, Estimated: >60 mL/min (ref >60)
Glucose, Bld: 107 mg/dL — ABNORMAL HIGH (ref 70–99)
Potassium: 3.8 mmol/L (ref 3.5–5.1)
Sodium: 139 mmol/L (ref 135–145)

## 2021-03-14 LAB — URINALYSIS, ROUTINE W REFLEX MICROSCOPIC
Bacteria, UA: NONE SEEN
Bilirubin Urine: NEGATIVE
Glucose, UA: NEGATIVE mg/dL
Hgb urine dipstick: NEGATIVE
Ketones, ur: NEGATIVE mg/dL
Nitrite: NEGATIVE
Protein, ur: NEGATIVE mg/dL
Specific Gravity, Urine: 1.015 (ref 1.005–1.030)
pH: 6 (ref 5.0–8.0)

## 2021-03-14 LAB — CBC
HCT: 35.7 % — ABNORMAL LOW (ref 36.0–46.0)
Hemoglobin: 11.5 g/dL — ABNORMAL LOW (ref 12.0–15.0)
MCH: 31.4 pg (ref 26.0–34.0)
MCHC: 32.2 g/dL (ref 30.0–36.0)
MCV: 97.5 fL (ref 80.0–100.0)
Platelets: 288 10*3/uL (ref 150–400)
RBC: 3.66 MIL/uL — ABNORMAL LOW (ref 3.87–5.11)
RDW: 13.9 % (ref 11.5–15.5)
WBC: 6.4 10*3/uL (ref 4.0–10.5)
nRBC: 0 % (ref 0.0–0.2)

## 2021-03-14 NOTE — ED Triage Notes (Signed)
EMS stated, she says she has a headache with some numbness on left side, face all the way down.

## 2021-03-14 NOTE — ED Triage Notes (Signed)
Symmetrical smile, hand grips  equal. Alert and Oriented x 4.

## 2021-03-14 NOTE — ED Notes (Signed)
Pt did not want to wait in the lobby any longer

## 2021-03-14 NOTE — ED Provider Notes (Signed)
Emergency Medicine Provider Triage Evaluation Note  Kimberly [name] , a 65 y.o. female  was evaluated in triage.  Pt complains of left-sided headache, numbness, with photophobia.  Patient attributes the symptoms to having cataract surgery in May, believes that it has never healed up properly, she reports that she has had some the symptoms are not however they have worsened this morning.  Patient reports that her cancer was treated with radiation, she did not receive any treatment anymore.  Patient has not had similar headaches or symptoms in the past.  No other weakness, dysarthria, facial droop, balance issues.  Review of Systems  Positive: As above Negative: As above  Physical Exam  BP 122/76 (BP Location: Left Arm)   Pulse 73   Temp 98.7 F (37.1 C) (Oral)   Resp 16   LMP 08/21/1982   SpO2 95%  Gen:   Awake, no distress   Resp:  Normal effort  MSK:   Moves extremities without difficulty  Other:  CN3-12 grossly intact, patient denies difference in sensation when touching the sides of her face, however she does report a subjective difference in her perception of both sides of her face  Medical Decision Making  Medically screening exam initiated at 10:30 AM.  Appropriate orders placed.  Kimberly [name] was informed that the remainder of the evaluation will be completed by another provider, this initial triage assessment does not replace that evaluation, and the importance of remaining in the ED until their evaluation is complete.  Eye pain, new unilateral facial numbness   Dorien Chihuahua 03/14/21 1032    Daleen Bo, MD 03/15/21 [phone]

## 2021-03-20 ENCOUNTER — Telehealth: Payer: Self-pay | Admitting: Gastroenterology

## 2021-03-20 NOTE — Telephone Encounter (Signed)
Inbound call from patient states she is having complications since the procedure. States she is having pain in esophagus. Best contact number [phone]

## 2021-03-20 NOTE — Telephone Encounter (Signed)
We will see her in clinic and decide next steps that may be available. Small intestine bacterial overgrowth testing and evaluation and treatment may be next steps. Thanks for update. GM

## 2021-03-20 NOTE — Telephone Encounter (Signed)
The pt states she continues to have the same symptoms with dysphagia, bloating and gas.  She wants to be seen by Dr Rush Landmark.  His first available is 12/6 at 830 am.  Offered to make an appt with [facility] however, the pt declines.  She asked to be set up with Dr Rush Landmark on 12/6 and will call back if her symptoms worsen.

## 2021-03-22 ENCOUNTER — Telehealth: Payer: Self-pay | Admitting: Gastroenterology

## 2021-03-22 ENCOUNTER — Ambulatory Visit: Payer: Medicare Other | Admitting: Family Medicine

## 2021-03-22 MED ORDER — AMBULATORY NON FORMULARY MEDICATION: 30.0000 mL | Freq: Four times a day (QID) | 1 refills | Status: DC

## 2021-03-22 NOTE — Telephone Encounter (Signed)
Prescription has been faxed to the pharmacy and the pt aware.  She will call if this does not help to relieve her pain and also keep the upcoming appt.

## 2021-03-22 NOTE — Telephone Encounter (Signed)
Dr Elicia Lamp the pt states she continues to have bloating and gas feeling in the esophagus since her EGD.  She had an appt on 9/30 with [facility] and has a follow up with you on 12/6.  Please advise

## 2021-03-22 NOTE — Telephone Encounter (Signed)
Patty, While we trial a GI cocktail for her to see if that will help okay to have Maalox and lidocaine and hyoscyamine. Thanks. GM

## 2021-03-22 NOTE — Telephone Encounter (Signed)
After having EGD, patient is having constant gas pain around her esophageal area.  Would like advice as to what to do to get rid of it?

## 2021-03-24 ENCOUNTER — Ambulatory Visit (HOSPITAL_COMMUNITY)
Admission: EM | Admit: 2021-03-24 | Discharge: 2021-03-24 | Discharge status: Against Medical Advice | Payer: Medicare Other

## 2021-03-24 ENCOUNTER — Other Ambulatory Visit: Payer: Self-pay

## 2021-03-28 ENCOUNTER — Emergency Department (HOSPITAL_COMMUNITY)
Admission: EM | Admit: 2021-03-28 | Discharge: 2021-03-28 | Discharge status: Against Medical Advice | Payer: Medicare Other

## 2021-03-28 NOTE — ED Triage Notes (Signed)
Pt got off the stretcher and signed wavier and left.

## 2021-05-01 ENCOUNTER — Encounter (HOSPITAL_COMMUNITY): Payer: Self-pay

## 2021-05-01 ENCOUNTER — Other Ambulatory Visit: Payer: Self-pay

## 2021-05-01 ENCOUNTER — Ambulatory Visit (HOSPITAL_COMMUNITY)
Admission: EM | Admit: 2021-05-01 | Discharge: 2021-05-01 | Disposition: A | Discharge status: Home / Self Care | Payer: Medicare Other | Attending: Emergency Medicine | Admitting: Emergency Medicine

## 2021-05-01 DIAGNOSIS — R300 Dysuria: Secondary | ICD-10-CM | POA: Insufficient documentation

## 2021-05-01 LAB — POCT URINALYSIS DIPSTICK, ED / UC
Glucose, UA: NEGATIVE mg/dL
Hgb urine dipstick: NEGATIVE
Ketones, ur: 15 mg/dL — AB
Nitrite: NEGATIVE
Protein, ur: 30 mg/dL — AB
Specific Gravity, Urine: >=1.03 (ref 1.005–1.030)
Urobilinogen, UA: 0.2 mg/dL (ref 0.0–1.0)
pH: 6 (ref 5.0–8.0)

## 2021-05-01 NOTE — ED Triage Notes (Signed)
Pt presents with burning during urination and urinary frequency since waking up this morning.

## 2021-05-01 NOTE — ED Provider Notes (Signed)
[place]    CSN: [id number] Arrival date & time: 05/01/21  1109      History   Chief Complaint Chief Complaint  Patient presents with   UTI    HPI Kimberly [name] is a 65 y.o. female.   Patient presents with dysuria, urinary frequency and urgency, abdominal pain and flank pain for 15 days. Endorses yellow vaginal discharge, itching and irritation. Has attempted use of increased fluid intake which was not helpful. Denies fever , chills, hematuria, bowel changes. Denies sexual active.   Past Medical History:  Diagnosis Date   Alcohol abuse, in remission    since around 4315   Alcoholic hepatitis    fatty liver on imaging- Treated Hepatits C and treated   Allergic rhinitis    Anemia    EGD with duodenal AVM, normal colonoscopy 04/2012   Asthma    Callus of foot 2009   Cancer [facility])    Chest pain, musculoskeletal 2011   Cholelithiasis    [place] 09/2008: Cholelithiasis is present, s/p cholecystectomy   Depression    Excessive cerumen in ear canal    since before 2000   GERD (gastroesophageal reflux disease)    Hiatal hernia    History of blood transfusion    Hyperlipidemia    Hypertension    Menopausal syndrome    Treated with estradiol in the past - discontinued 04/2012   MENOPAUSAL SYNDROME 09/24/2006   2008 Note : The pt is adament about having this medication.  She fully understands the increased risk of heart disease and cancer that is associated with HRT and is willing to accept this risk in order to prevent the debilitating hot flashes she has off this medication.  Will continue to encourage her to try to wean herself off of this medication at our next visit.  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 ([place]) 05/12/2020   Mass of upper lobe of right lung 05/03/2020   COVID-19 09/25/2019   History of dysuria 09/24/2019   Rhinosinusitis 08/27/2019   Dysuria 05/25/2019   Tachycardia 05/25/2019   Hematuria 05/25/2019   AVM (arteriovenous malformation) of small bowel, 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   Health care maintenance 03/25/2013   Depression 12/03/2012   Iron deficiency anemia 05/08/2012   Insomnia 05/07/2012   Weight loss 07/27/2009   HLD (hyperlipidemia) 03/25/2008   Constipation 11/04/2007   Essential hypertension 09/24/2006   Allergies 09/24/2006   Asthma 09/24/2006   GERD 09/24/2006    Past Surgical History:  Procedure Laterality Date   ABDOMINAL HYSTERECTOMY  1980s   BIOPSY  03/18/2019   Procedure: BIOPSY;  Surgeon: Irving Copas., MD;  Location: [place];  Service: Gastroenterology;;   BRONCHIAL BIOPSY  05/03/2020   Procedure: BRONCHIAL BIOPSIES;  Surgeon: Collene Gobble, MD;  Location: [facility] ENDOSCOPY;  Service: Pulmonary;;   BRONCHIAL BRUSHINGS  05/03/2020   Procedure: BRONCHIAL BRUSHINGS;  Surgeon: Collene Gobble, MD;  Location: [facility] ENDOSCOPY;  Service: Pulmonary;;   BRONCHIAL NEEDLE ASPIRATION BIOPSY  05/03/2020   Procedure: BRONCHIAL NEEDLE ASPIRATION BIOPSIES;  Surgeon: Collene Gobble, MD;  Location: [facility] ENDOSCOPY;  Service: Pulmonary;;   CHOLECYSTECTOMY  01/06/09   COLONOSCOPY  05/09/2012   Procedure: COLONOSCOPY;  Surgeon: Inda Castle, MD;  Location: [place];  Service: Endoscopy;  Laterality: N/A;   COLONOSCOPY WITH PROPOFOL N/A 03/18/2019   Procedure: COLONOSCOPY WITH PROPOFOL;  Surgeon: Rush Landmark Telford Nab., MD;  Location: [place];  Service: Gastroenterology;  Laterality: N/A;   ENTEROSCOPY N/A  03/18/2019   Procedure: ENTEROSCOPY;  Surgeon: Rush Landmark Telford Nab., MD;  Location: [place];  Service: Gastroenterology;  Laterality: N/A;   ESOPHAGOGASTRODUODENOSCOPY  05/09/2012   Procedure: ESOPHAGOGASTRODUODENOSCOPY (EGD);  Surgeon: Inda Castle, MD;  Location: [place];  Service: Endoscopy;  Laterality: N/A;   FIDUCIAL MARKER PLACEMENT  05/03/2020   Procedure: FIDUCIAL MARKER PLACEMENT;  Surgeon: Collene Gobble, MD;  Location[facility];  Service: Pulmonary;;   HEMOSTASIS CLIP PLACEMENT  03/18/2019   Procedure: HEMOSTASIS CLIP PLACEMENT;  Surgeon: Irving Copas., MD;  Location: [place];  Service: Gastroenterology;;   HOT HEMOSTASIS N/A 03/18/2019   Procedure: HOT HEMOSTASIS (ARGON PLASMA COAGULATION/BICAP);  Surgeon: Irving Copas., MD;  Location: [place];  Service: Gastroenterology;  Laterality: N/A;   POLYPECTOMY  03/18/2019   Procedure: POLYPECTOMY;  Surgeon: Rush Landmark Telford Nab., MD;  Location: [place];  Service: Gastroenterology;;   SUBMUCOSAL TATTOO INJECTION  03/18/2019   Procedure: SUBMUCOSAL TATTOO INJECTION;  Surgeon: Irving Copas., MD;  Location: [place];  Service: Gastroenterology;;   VIDEO BRONCHOSCOPY WITH ENDOBRONCHIAL NAVIGATION N/A 05/03/2020   Procedure: VIDEO BRONCHOSCOPY WITH ENDOBRONCHIAL NAVIGATION;  Surgeon: Collene Gobble, MD;  Location: [place] ENDOSCOPY;  Service: Pulmonary;  Laterality: N/A;    OB History   No obstetric history on file.      Home Medications    Prior to Admission medications   Medication Sig Start Date End Date Taking? Authorizing Provider  alendronate (FOSAMAX) 70 MG tablet Take 70 mg by mouth once a week. 01/02/21   [provider]  AMBULATORY NON FORMULARY MEDICATION Take 30 mLs by mouth in the morning, at noon, in the evening, and at bedtime. Medication Name: 90 ml viscous lidocaine, 90 ml 10mg /18ml dicyclomine, 270 ml maalox Swish and swallow 03/22/21   Mansouraty,  Telford Nab., MD  Capsaicin-Menthol-Methyl Sal (CAPSAICIN-METHYL SAL-MENTHOL) 0.025-1-12 % CREA Apply 1 each topically in the morning and at bedtime. 03/13/21   Elba Barman, DO  cetirizine (ZYRTEC ALLERGY) 10 MG tablet Take 1 tablet (10 mg total) by mouth daily. 03/07/21   Jaynee Eagles, PA-C  diphenhydrAMINE (BENADRYL) 50 MG tablet Take 1 tablet (50 mg total) by mouth as directed. -Take 1 tablet at 9:00am ( one hour before your CT scan).. 02/17/21   Curt Bears, MD  doxycycline (VIBRAMYCIN) 100 MG capsule Take 1 capsule (100 mg total) by mouth 2 (two) times daily. 03/07/21   Jaynee Eagles, PA-C  fluticasone (FLONASE) 50 MCG/ACT nasal spray Use 1-2 sprays per nostril once daily as needed for sinus congestion or allergy symptoms. 09/20/20   Jeralyn Bennett, MD  fluticasone (FLOVENT HFA) 110 MCG/ACT inhaler Inhale 2 puffs into the lungs 2 (two) times daily. 09/20/20   Jeralyn Bennett, MD  lidocaine (LIDODERM) 5 % Place 1 patch onto the skin daily. Remove & Discard patch within 12 hours or as directed by MD 03/13/21   Elba Barman, DO  losartan (COZAAR) 25 MG tablet Take 1 tablet (  25 mg total) by mouth daily. 08/10/20   Mosetta Anis, MD  predniSONE (DELTASONE) 20 MG tablet Take 2 tablets daily with breakfast. 03/07/21   Jaynee Eagles, PA-C    Family History Family History  Problem Relation Age of Onset   Hypertension Mother    Colon cancer Neg Hx    Esophageal cancer Neg Hx    Inflammatory bowel disease Neg Hx    Liver disease Neg Hx    Pancreatic cancer Neg Hx    Rectal cancer Neg Hx    Stomach cancer Neg Hx     Social History Social History   Tobacco Use   Smoking status: Former    Types: Cigarettes    Quit date: 08/21/1995    Years since quitting: 25.7   Smokeless tobacco: Never   Tobacco comments:    Patient states she stopped smoking in 31 yrs ago (as of 2022)  Vaping Use   Vaping Use: Never used  Substance Use Topics   Alcohol use: No    Comment: in remission since 2007    Drug use: Not Currently    Comment: previously used marijuana      Allergies   Contrast media [iodinated diagnostic agents], Banana, Gabapentin, Grape (artificial) flavor, Lamotrigine, Ibuprofen, and Penicillins   Review of Systems Review of Systems  Constitutional: Negative.   Respiratory: Negative.    Cardiovascular: Negative.   Gastrointestinal:  Positive for abdominal distention. Negative for abdominal pain, anal bleeding, blood in stool, constipation, diarrhea, nausea, rectal pain and vomiting.  Genitourinary:  Positive for dysuria, flank pain, frequency and urgency. Negative for decreased urine volume, difficulty urinating, dyspareunia, enuresis, genital sores, hematuria, menstrual problem, pelvic pain, vaginal bleeding, vaginal discharge and vaginal pain.  Skin: Negative.   Neurological: Negative.     Physical Exam Triage Vital Signs ED Triage Vitals  Enc Vitals Group     BP 05/01/21 1227 (!) 160/100     Pulse Rate 05/01/21 1227 100     Resp 05/01/21 1227 17     Temp 05/01/21 1227 97.8 F (36.6 C)     Temp Source 05/01/21 1227 Oral     SpO2 05/01/21 1227 96 %     Weight --      Height --      Head Circumference --      Peak Flow --      Pain Score 05/01/21 1226 3     Pain Loc --      Pain Edu? --      Excl. in [place]? --    No data found.  Updated Vital Signs BP (!) 160/100 (BP Location: Left Arm)   Pulse 100   Temp 97.8 F (36.6 C) (Oral)   Resp 17   LMP 08/21/1982   SpO2 96%   Visual Acuity Right Eye Distance:   Left Eye Distance:   Bilateral Distance:    Right Eye Near:   Left Eye Near:    Bilateral Near:     Physical Exam Constitutional:      Appearance: Normal appearance. She is normal weight.  HENT:     Head: Normocephalic.  Eyes:     Extraocular Movements: Extraocular movements intact.  Cardiovascular:     Rate and Rhythm: Normal rate and regular rhythm.     Pulses: Normal pulses.     Heart sounds: Normal heart sounds.  Pulmonary:      Effort: Pulmonary effort is normal.  Abdominal:     General: Abdomen is  flat. Bowel sounds are normal.     Palpations: Abdomen is soft.     Tenderness: There is no right CVA tenderness or left CVA tenderness.  Skin:    General: Skin is warm and dry.  Neurological:     Mental Status: She is alert and oriented to person, place, and time. Mental status is at baseline.  Psychiatric:        Mood and Affect: Mood normal.        Behavior: Behavior normal.     UC Treatments / Results  Labs (all labs ordered are listed, but only abnormal results are displayed) Labs Reviewed  POCT URINALYSIS DIPSTICK, ED / UC - Abnormal; Notable for the following components:      Result Value   Bilirubin Urine SMALL (*)    Ketones, ur 15 (*)    Protein, ur 30 (*)    Leukocytes,Ua SMALL (*)    All other components within normal limits  URINE CULTURE    EKG   Radiology No results found.  Procedures Procedures (including critical care time)  Medications Ordered in UC Medications - No data to display  Initial Impression / Assessment and Plan / UC Course  I have reviewed the triage vital signs and the nursing notes.  Pertinent labs & imaging results that were available during my care of the patient were reviewed by me and considered in my medical decision making (see chart for details).  Dysuria  1.  Urinalysis showing Raliyah Montella blood cells but no nitrates, sent for culture, discussed findings with patient  2.  STI screening pending, will treat per protocol, patient currently denies sexual activity 3.  Recommended PC P follow-up persistent symptoms of all testing today negative Final Clinical Impressions(s) / UC Diagnoses   Final diagnoses:  None   Discharge Instructions   None    ED Prescriptions   None    PDMP not reviewed this encounter.   Hans Eden, NP 05/01/21 1337

## 2021-05-01 NOTE — Discharge Instructions (Addendum)
Your urinalysis showed Kimberly [name] blood cells but did not show bacteria therefore we will send it to the lab and see if it grows any bacteria, if this occurs then an antibiotic will be sent in for use  We will complete a vaginal swab to rule out any infection due to you having vaginal discharge, if positive for any infection you will be notified and medication sent into the pharmacy  If all testing today is negative and symptoms persist please follow-up with your primary care doctor for further evaluation.

## 2021-05-02 ENCOUNTER — Ambulatory Visit: Payer: Medicare Other | Admitting: Gastroenterology

## 2021-05-02 LAB — URINE CULTURE: Culture: NO GROWTH

## 2021-05-02 LAB — CERVICOVAGINAL ANCILLARY ONLY
Bacterial Vaginitis (gardnerella): POSITIVE — AB
Candida Glabrata: NEGATIVE
Candida Vaginitis: NEGATIVE
Chlamydia: NEGATIVE
Comment: NEGATIVE
Comment: NEGATIVE
Comment: NEGATIVE
Comment: NEGATIVE
Comment: NEGATIVE
Comment: NORMAL
Neisseria Gonorrhea: NEGATIVE
Trichomonas: NEGATIVE

## 2021-05-03 ENCOUNTER — Telehealth (HOSPITAL_COMMUNITY): Payer: Self-pay | Admitting: Emergency Medicine

## 2021-05-03 MED ORDER — METRONIDAZOLE 500 MG PO TABS: 500.0000 mg | ORAL_TABLET | Freq: Two times a day (BID) | ORAL | 0 refills | Status: DC

## 2021-05-04 ENCOUNTER — Other Ambulatory Visit: Payer: Self-pay | Admitting: Physician Assistant

## 2021-05-04 DIAGNOSIS — C3411 Malignant neoplasm of upper lobe, right bronchus or lung: Secondary | ICD-10-CM

## 2021-05-10 ENCOUNTER — Ambulatory Visit: Payer: Medicare Other | Admitting: Gastroenterology

## 2021-05-10 ENCOUNTER — Telehealth: Payer: Self-pay | Admitting: Gastroenterology

## 2021-05-10 ENCOUNTER — Telehealth: Payer: Self-pay | Admitting: Medical Oncology

## 2021-05-10 NOTE — Telephone Encounter (Signed)
Good Morning Dr. Rush Landmark,   Patient called to cancel appointment with you today at 10:10 due to feeling sick.  Patient was rescheduled for 1/6 at 2:10

## 2021-05-10 NOTE — Telephone Encounter (Signed)
"   I am still sick. I need to r/s my appt on Friday". Schedule message sent.

## 2021-05-10 NOTE — Telephone Encounter (Signed)
I hope she feels better. Thanks for the update. GM

## 2021-05-12 ENCOUNTER — Other Ambulatory Visit: Payer: Self-pay

## 2021-05-12 ENCOUNTER — Encounter (HOSPITAL_COMMUNITY): Payer: Self-pay | Admitting: Emergency Medicine

## 2021-05-12 ENCOUNTER — Ambulatory Visit (HOSPITAL_COMMUNITY): Payer: Medicare Other

## 2021-05-12 ENCOUNTER — Inpatient Hospital Stay: Payer: Medicare Other

## 2021-05-12 ENCOUNTER — Ambulatory Visit (HOSPITAL_COMMUNITY)
Admission: EM | Admit: 2021-05-12 | Discharge: 2021-05-12 | Disposition: A | Discharge status: Home / Self Care | Payer: Medicare Other | Attending: Student | Admitting: Student

## 2021-05-12 ENCOUNTER — Ambulatory Visit (INDEPENDENT_AMBULATORY_CARE_PROVIDER_SITE_OTHER): Payer: Medicare Other

## 2021-05-12 DIAGNOSIS — R0789 Other chest pain: Secondary | ICD-10-CM

## 2021-05-12 DIAGNOSIS — Z85118 Personal history of other malignant neoplasm of bronchus and lung: Secondary | ICD-10-CM

## 2021-05-12 NOTE — ED Triage Notes (Signed)
Pt c/o right sided chest pains for a month  Reports she did have a fall

## 2021-05-12 NOTE — ED Provider Notes (Signed)
[place]    CSN: [id number] Arrival date & time: 05/12/21  1103      History   Chief Complaint Chief Complaint  Patient presents with   Chest Pain    HPI Kimberly [name] is a 65 y.o. female presenting with right-sided chest wall pain for about 1 month.  Medical history lung cancer in remission, alcoholism in remission since 2005.,  Former smoker.  Patient states that she has had some right-sided chest wall pain with movement and sleeping on her side for about 1 month, worse after a fall that happened on 12/1.  States that when she was walking, her feet slipped from under her and she fell on her knees, denies any other pain today including knee pain, leg pain, back pain.  Denies shortness of breath, left-sided chest pain, dizziness, weakness, chest pain at rest.   HPI  Past Medical History:  Diagnosis Date   Alcohol abuse, in remission    since around 9983   Alcoholic hepatitis    fatty liver on imaging- Treated Hepatits C and treated   Allergic rhinitis    Anemia    EGD with duodenal AVM, normal colonoscopy 04/2012   Asthma    Callus of foot 2009   Cancer [facility])    Chest pain, musculoskeletal 2011   Cholelithiasis    [place] 09/2008: Cholelithiasis is present, s/p cholecystectomy   Depression    Excessive cerumen in ear canal    since before 2000   GERD (gastroesophageal reflux disease)    Hiatal hernia    History of blood transfusion    Hyperlipidemia    Hypertension    Menopausal syndrome    Treated with estradiol in the past - discontinued 04/2012   MENOPAUSAL SYNDROME 09/24/2006   2008 Note : The pt is adament about having this medication.  She fully understands the increased risk of heart disease and cancer that is associated with HRT and is willing to accept this risk in order to prevent the debilitating hot flashes she has off this medication.  Will continue to encourage her to try to wean herself off of this medication at our next visit.  2009 note  indicated that she tr   Otitis externa, acute 06/2010   bilateral, treated with azithromycin after failure of neomycin drops   Otitis media, acute 06/2010   PONV (postoperative nausea and vomiting)     Patient Active Problem List   Diagnosis Date Noted   Left arm pain 01/17/2021   Left leg pain 01/17/2021   Numbness 11/16/2020   Facial pain 11/16/2020   Osteoarthritis of cervical spine 11/16/2020   Left-sided chest wall pain 07/12/2020   Chest wall muscle strain 07/12/2020   Malignant neoplasm of bronchus of right upper lobe ([place]) 05/12/2020   Mass of upper lobe of right lung 05/03/2020   COVID-19 09/25/2019   History of dysuria 09/24/2019   Rhinosinusitis 08/27/2019   Dysuria 05/25/2019   Tachycardia 05/25/2019   Hematuria 05/25/2019   AVM (arteriovenous malformation) of small bowel, 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   Health care maintenance 03/25/2013   Depression 12/03/2012   Iron deficiency anemia 05/08/2012   Insomnia 05/07/2012   Weight loss 07/27/2009   HLD (hyperlipidemia) 03/25/2008   Constipation 11/04/2007   Essential hypertension 09/24/2006   Allergies 09/24/2006   Asthma 09/24/2006   GERD 09/24/2006    Past Surgical History:  Procedure Laterality Date   ABDOMINAL HYSTERECTOMY  1980s   BIOPSY  03/18/2019   Procedure: BIOPSY;  Surgeon: Rush Landmark Telford Nab., MD;  Location: [place];  Service: Gastroenterology;;   BRONCHIAL BIOPSY  05/03/2020   Procedure: BRONCHIAL BIOPSIES;  Surgeon: Collene Gobble, MD;  Location: [facility] ENDOSCOPY;  Service: Pulmonary;;   BRONCHIAL BRUSHINGS  05/03/2020   Procedure: BRONCHIAL BRUSHINGS;  Surgeon: Collene Gobble, MD;  Location: [facility] ENDOSCOPY;  Service: Pulmonary;;   BRONCHIAL NEEDLE ASPIRATION BIOPSY  05/03/2020   Procedure: BRONCHIAL NEEDLE ASPIRATION BIOPSIES;  Surgeon: Collene Gobble, MD;  Location: [facility] ENDOSCOPY;  Service: Pulmonary;;   CHOLECYSTECTOMY  01/06/09   COLONOSCOPY  05/09/2012   Procedure: COLONOSCOPY;  Surgeon: Inda Castle, MD;  Location: [place];  Service: Endoscopy;  Laterality: N/A;   COLONOSCOPY WITH PROPOFOL N/A 03/18/2019   Procedure: COLONOSCOPY WITH PROPOFOL;  Surgeon: Rush Landmark Telford Nab., MD;  Location: [place];  Service: Gastroenterology;  Laterality: N/A;   ENTEROSCOPY N/A 03/18/2019   Procedure: ENTEROSCOPY;  Surgeon: Rush Landmark Telford Nab., MD;  Location: [place];  Service: Gastroenterology;  Laterality: N/A;   ESOPHAGOGASTRODUODENOSCOPY  05/09/2012   Procedure: ESOPHAGOGASTRODUODENOSCOPY (EGD);  Surgeon: Inda Castle, MD;  Location: [place];  Service: Endoscopy;  Laterality: N/A;   FIDUCIAL MARKER PLACEMENT  05/03/2020   Procedure: FIDUCIAL MARKER PLACEMENT;  Surgeon: Collene Gobble, MD;  Location: [facility] ENDOSCOPY;  Service: Pulmonary;;   HEMOSTASIS CLIP PLACEMENT  03/18/2019   Procedure: HEMOSTASIS CLIP PLACEMENT;  Surgeon: Irving Copas., MD;  Location: [place];  Service: Gastroenterology;;   HOT HEMOSTASIS N/A 03/18/2019   Procedure: HOT HEMOSTASIS (ARGON PLASMA COAGULATION/BICAP);  Surgeon: Irving Copas., MD;  Location: [place];  Service: Gastroenterology;  Laterality: N/A;   POLYPECTOMY  03/18/2019   Procedure: POLYPECTOMY;  Surgeon: Rush Landmark Telford Nab., MD;  Location: [place];  Service: Gastroenterology;;   SUBMUCOSAL TATTOO INJECTION  03/18/2019   Procedure: SUBMUCOSAL TATTOO INJECTION;  Surgeon: Irving Copas., MD;  Location: [place];  Service: Gastroenterology;;   VIDEO BRONCHOSCOPY WITH ENDOBRONCHIAL NAVIGATION N/A 05/03/2020   Procedure: VIDEO BRONCHOSCOPY WITH ENDOBRONCHIAL NAVIGATION;  Surgeon: Collene Gobble, MD;  Location: [place] ENDOSCOPY;  Service: Pulmonary;  Laterality: N/A;    OB History   No obstetric history on file.      Home Medications    Prior to Admission medications   Medication Sig Start Date End Date Taking? Authorizing Provider  alendronate (FOSAMAX) 70 MG tablet Take 70 mg by  mouth once a week. 01/02/21   [provider]  AMBULATORY NON FORMULARY MEDICATION Take 30 mLs by mouth in the morning, at noon, in the evening, and at bedtime. Medication Name: 90 ml viscous lidocaine, 90 ml 10mg /83ml dicyclomine, 270 ml maalox Swish and swallow 03/22/21   Mansouraty, Telford Nab., MD  Capsaicin-Menthol-Methyl Sal (CAPSAICIN-METHYL SAL-MENTHOL) 0.025-1-12 % CREA Apply 1 each topically in the morning and at bedtime. 03/13/21   Elba Barman, DO  cetirizine (ZYRTEC ALLERGY) 10 MG tablet Take 1 tablet (10 mg total) by mouth daily. 03/07/21   Jaynee Eagles, PA-C  diphenhydrAMINE (BENADRYL) 50 MG tablet Take 1 tablet (50 mg total) by mouth as directed. -Take 1 tablet at 9:00am ( one hour before your CT scan).. 02/17/21   Curt Bears, MD  doxycycline (VIBRAMYCIN) 100 MG capsule Take 1 capsule (100 mg total) by mouth 2 (two) times daily. 03/07/21   Jaynee Eagles, PA-C  fluticasone (FLONASE) 50 MCG/ACT nasal spray Use 1-2 sprays per nostril once daily as needed for sinus congestion or allergy symptoms. 09/20/20   Jeralyn Bennett, MD  fluticasone (FLOVENT HFA)  110 MCG/ACT inhaler Inhale 2 puffs into the lungs 2 (two) times daily. 09/20/20   Jeralyn Bennett, MD  lidocaine (LIDODERM) 5 % Place 1 patch onto the skin daily. Remove & Discard patch within 12 hours or as directed by MD 03/13/21   Elba Barman, DO  losartan (COZAAR) 25 MG tablet Take 1 tablet (25 mg total) by mouth daily. 08/10/20   Mosetta Anis, MD  metroNIDAZOLE (FLAGYL) 500 MG tablet Take 1 tablet (500 mg total) by mouth 2 (two) times daily. 05/03/21   Chase Picket, MD  predniSONE (DELTASONE) 20 MG tablet Take 2 tablets daily with breakfast. 03/07/21   Jaynee Eagles, PA-C    Family History Family History  Problem Relation Age of Onset   Hypertension Mother    Colon cancer Neg Hx    Esophageal cancer Neg Hx    Inflammatory bowel disease Neg Hx    Liver disease Neg Hx    Pancreatic cancer Neg Hx    Rectal cancer Neg  Hx    Stomach cancer Neg Hx     Social History Social History   Tobacco Use   Smoking status: Former    Types: Cigarettes    Quit date: 08/21/1995    Years since quitting: 25.7   Smokeless tobacco: Never   Tobacco comments:    Patient states she stopped smoking in 31 yrs ago (as of 2022)  Vaping Use   Vaping Use: Never used  Substance Use Topics   Alcohol use: No    Comment: in remission since 2007   Drug use: Not Currently    Comment: previously used marijuana      Allergies   Contrast media [iodinated diagnostic agents], Banana, Gabapentin, Grape (artificial) flavor, Lamotrigine, Ibuprofen, and Penicillins   Review of Systems Review of Systems  Constitutional:  Negative for appetite change, chills and fever.  HENT:  Negative for congestion, ear pain, rhinorrhea, sinus pressure, sinus pain and sore throat.   Eyes:  Negative for redness and visual disturbance.  Respiratory:  Negative for cough, chest tightness, shortness of breath and wheezing.   Cardiovascular:  Negative for chest pain and palpitations.  Gastrointestinal:  Negative for abdominal pain, constipation, diarrhea, nausea and vomiting.  Genitourinary:  Negative for dysuria, frequency and urgency.  Musculoskeletal:  Negative for myalgias.       Chest wall pain  Neurological:  Negative for dizziness, weakness and headaches.  Psychiatric/Behavioral:  Negative for confusion.   All other systems reviewed and are negative.   Physical Exam Triage Vital Signs ED Triage Vitals  Enc Vitals Group     BP 05/12/21 1238 (!) 154/83     Pulse Rate 05/12/21 1238 98     Resp 05/12/21 1238 16     Temp 05/12/21 1238 97.8 F (36.6 C)     Temp Source 05/12/21 1238 Oral     SpO2 05/12/21 1238 100 %     Weight --      Height --      Head Circumference --      Peak Flow --      Pain Score 05/12/21 1237 10     Pain Loc --      Pain Edu? --      Excl. in [place]? --    No data found.  Updated Vital Signs BP (!) 154/83  (BP Location: Right Arm)    Pulse 98    Temp 97.8 F (36.6 C) (Oral)    Resp 16  LMP 08/21/1982    SpO2 100%   Visual Acuity Right Eye Distance:   Left Eye Distance:   Bilateral Distance:    Right Eye Near:   Left Eye Near:    Bilateral Near:     Physical Exam Vitals reviewed.  Constitutional:      General: She is not in acute distress.    Appearance: Normal appearance. She is not ill-appearing or diaphoretic.  HENT:     Head: Normocephalic and atraumatic.  Cardiovascular:     Rate and Rhythm: Normal rate and regular rhythm.     Heart sounds: Normal heart sounds.  Pulmonary:     Effort: Pulmonary effort is normal.     Breath sounds: Normal breath sounds.  Chest:     Comments: R chest wall- TTP, without point tenderness. No skin changes or effusion. Skin:    General: Skin is warm.  Neurological:     General: No focal deficit present.     Mental Status: She is alert and oriented to person, place, and time.  Psychiatric:        Mood and Affect: Mood normal.        Behavior: Behavior normal.        Thought Content: Thought content normal.        Judgment: Judgment normal.     UC Treatments / Results  Labs (all labs ordered are listed, but only abnormal results are displayed) Labs Reviewed - No data to display  EKG   Radiology DG Chest 2 View  Result Date: 05/12/2021 CLINICAL DATA:  65 year old female with history of right-sided chest wall pain for the past month. EXAM: CHEST - 2 VIEW COMPARISON:  Chest x-ray 03/13/2021. FINDINGS: Three fiducial markers are again noted in the region of the left lower lobe. Increasing ill-defined nodular architectural distortion in the adjacent lung. Patchy areas of interstitial prominence and ill-defined nodularity are also noted throughout the right upper lobe, similar to the prior examination. Chronic nodularity in the left upper lobe near the apex also unchanged. No pleural effusions. No pneumothorax. No evidence of pulmonary  edema. Heart size is normal. Upper mediastinal contours are within normal limits. IMPRESSION: 1. New ill-defined nodular architectural distortion in the left lower lobe adjacent to the indwelling fiducial markers, potentially related to evolving postradiation changes, although progressive neoplasm or focal infectious airspace consolidation is not excluded. 2. The appearance the chest is otherwise similar to the prior examination. Electronically Signed   By: Vinnie Langton M.D.   On: 05/12/2021 13:01    Procedures Procedures (including critical care time)  Medications Ordered in UC Medications - No data to display  Initial Impression / Assessment and Plan / UC Course  I have reviewed the triage vital signs and the nursing notes.  Pertinent labs & imaging results that were available during my care of the patient were reviewed by me and considered in my medical decision making (see chart for details).     This patient is a very pleasant 65 y.o. year old female presenting with right-sided chest wall pain x1 month. She did have a recent fall, but this apparently happened after the chest pain started. She has a history of lung cancer in remission.  CXR: 1. New ill-defined nodular architectural distortion in the left lower lobe adjacent to the indwelling fiducial markers, potentially related to evolving postradiation changes, although progressive neoplasm or focal infectious airspace consolidation is not excluded. 2. The appearance the chest is otherwise similar to the prior examination.  Results discussed with patient. She already has follow-up CT scan scheduled for 05/26/21.   Trial of heating pad for chest wall pain; she declines medications for this.   ED return precautions discussed. Patient verbalizes understanding and agreement.     Final Clinical Impressions(s) / UC Diagnoses   Final diagnoses:  Chest wall pain  History of lung cancer     Discharge Instructions       -Your xray looks good, though they do recommend a follow-up CT to get further detail of the lungs. Follow-up as scheduled 12/30.  -Try a heating pad, warm bath, etc for your chest wall pain.     ED Prescriptions   None    PDMP not reviewed this encounter.   Hazel Sams, PA-C 05/12/21 1322

## 2021-05-12 NOTE — Discharge Instructions (Addendum)
-  Your xray looks good, though they do recommend a follow-up CT to get further detail of the lungs. Follow-up as scheduled 12/30.  -Try a heating pad, warm bath, etc for your chest wall pain.

## 2021-05-15 ENCOUNTER — Inpatient Hospital Stay: Payer: Medicare Other | Admitting: Internal Medicine

## 2021-05-16 ENCOUNTER — Telehealth: Payer: Self-pay | Admitting: Neurology

## 2021-05-16 NOTE — Telephone Encounter (Signed)
Patient called in to see if we could send a copy of her MRI results from 10/2020 to her PCP Arthur Holms NP at fax # [phone]. She also asked if I could tell her what the results are from this MRI. I let patient know I am not clinical staff and unable to discuss this information with her and that we would need a release form to send information over to her PCP. Patient stated she will come to office in the morning to sign a release form and will be receiving a call back from a clinical staff member to discuss medical information.

## 2021-05-16 NOTE — Telephone Encounter (Addendum)
Called pt back. Went over MRI results again per Dr. Garth Bigness note: "The MRI of the cervical spine showed some degenerative changes.  The worst level was C3-C4 where there was a disc protrusion towards the left.  This could cause some neck pain and some left shoulder pain and numbness but would not be expected to cause numbness elsewhere."  She has tried/failed: gabapentin, lamotrigine, imipramine. She is having ongoing neck pain. Wanting to know what other med options there are to treat her. Advised she will need to come in for OV to discuss. Scheduled appt for 05/25/21 at 930am with Dr. Felecia Shelling. Looks like she cx 03/22/21 appt w/ AL,NP stating she did not need appt.

## 2021-05-24 ENCOUNTER — Emergency Department (HOSPITAL_COMMUNITY)
Admission: EM | Admit: 2021-05-24 | Discharge: 2021-05-24 | Disposition: A | Discharge status: Home / Self Care | Payer: Medicare Other | Attending: Emergency Medicine | Admitting: Emergency Medicine

## 2021-05-24 ENCOUNTER — Encounter (HOSPITAL_COMMUNITY): Payer: Self-pay | Admitting: *Deleted

## 2021-05-24 ENCOUNTER — Emergency Department (HOSPITAL_COMMUNITY): Payer: Medicare Other

## 2021-05-24 DIAGNOSIS — R42 Dizziness and giddiness: Secondary | ICD-10-CM | POA: Diagnosis present

## 2021-05-24 DIAGNOSIS — Z5321 Procedure and treatment not carried out due to patient leaving prior to being seen by health care provider: Secondary | ICD-10-CM | POA: Diagnosis not present

## 2021-05-24 LAB — BASIC METABOLIC PANEL
Anion gap: 8 (ref 5–15)
BUN: 12 mg/dL (ref 8–23)
CO2: 29 mmol/L (ref 22–32)
Calcium: 10 mg/dL (ref 8.9–10.3)
Chloride: 100 mmol/L (ref 98–111)
Creatinine, Ser: 0.85 mg/dL (ref 0.44–1.00)
GFR, Estimated: >60 mL/min (ref >60)
Glucose, Bld: 111 mg/dL — ABNORMAL HIGH (ref 70–99)
Potassium: 3.6 mmol/L (ref 3.5–5.1)
Sodium: 137 mmol/L (ref 135–145)

## 2021-05-24 LAB — CBC
HCT: 41.9 % (ref 36.0–46.0)
Hemoglobin: 13.2 g/dL (ref 12.0–15.0)
MCH: 30.6 pg (ref 26.0–34.0)
MCHC: 31.5 g/dL (ref 30.0–36.0)
MCV: 97.2 fL (ref 80.0–100.0)
Platelets: 430 10*3/uL — ABNORMAL HIGH (ref 150–400)
RBC: 4.31 MIL/uL (ref 3.87–5.11)
RDW: 12.5 % (ref 11.5–15.5)
WBC: 7.1 10*3/uL (ref 4.0–10.5)
nRBC: 0 % (ref 0.0–0.2)

## 2021-05-24 LAB — TROPONIN I (HIGH SENSITIVITY)
Troponin I (High Sensitivity): 4 ng/L (ref <18)
Troponin I (High Sensitivity): 4 ng/L (ref <18)

## 2021-05-24 NOTE — ED Triage Notes (Addendum)
To ED via [facility] for eval of dizziness for past 2 months. EMS spoke with pts pcp oncall nurse while at the home and told pt may not be compliant with meds. ** Per pt, her PCP has made frequent changes to her HCTZ and when they increased it the dizziness started. States she is having cp and left shoulder neck pain which is worse with movement. Pt is alert and oriented. Pain in neck/shoulder doesn't radiate. No n/v.

## 2021-05-24 NOTE — ED Notes (Signed)
Patient left without being seen, states "she will follow up with pcp"

## 2021-05-25 ENCOUNTER — Other Ambulatory Visit: Payer: Self-pay | Admitting: Medical Oncology

## 2021-05-25 ENCOUNTER — Telehealth: Payer: Self-pay | Admitting: Medical Oncology

## 2021-05-25 ENCOUNTER — Ambulatory Visit: Payer: Medicare Other | Admitting: Neurology

## 2021-05-25 DIAGNOSIS — Z91041 Radiographic dye allergy status: Secondary | ICD-10-CM

## 2021-05-25 MED ORDER — DIPHENHYDRAMINE HCL 50 MG PO TABS: 50.0000 mg | ORAL_TABLET | ORAL | 3 refills | Status: DC

## 2021-05-25 MED ORDER — PREDNISONE 50 MG PO TABS: ORAL_TABLET | ORAL | 3 refills | Status: DC

## 2021-05-25 NOTE — Telephone Encounter (Signed)
Allergy to CT dye- CT scan scheduled for tomorrow at 1030.  Pt refused to take prednisone and benadryl as ordered pre-meds for CT with contrast .  Order changed to CT without contrast.

## 2021-05-26 ENCOUNTER — Ambulatory Visit (HOSPITAL_COMMUNITY)
Admission: RE | Admit: 2021-05-26 | Discharge: 2021-05-26 | Disposition: A | Discharge status: Home / Self Care | Payer: Medicare Other | Source: Ambulatory Visit | Attending: Internal Medicine | Admitting: Internal Medicine

## 2021-05-26 ENCOUNTER — Other Ambulatory Visit: Payer: Self-pay

## 2021-05-26 ENCOUNTER — Inpatient Hospital Stay: Payer: Medicare Other | Attending: Internal Medicine

## 2021-05-26 DIAGNOSIS — C3490 Malignant neoplasm of unspecified part of unspecified bronchus or lung: Secondary | ICD-10-CM

## 2021-05-26 LAB — CBC WITH DIFFERENTIAL (CANCER CENTER ONLY)
Abs Immature Granulocytes: 0.01 10*3/uL (ref 0.00–0.07)
Basophils Absolute: 0 10*3/uL (ref 0.0–0.1)
Basophils Relative: 1 %
Eosinophils Absolute: 0.1 10*3/uL (ref 0.0–0.5)
Eosinophils Relative: 1 %
HCT: 39.1 % (ref 36.0–46.0)
Hemoglobin: 12.9 g/dL (ref 12.0–15.0)
Immature Granulocytes: 0 %
Lymphocytes Relative: 20 %
Lymphs Abs: 1.1 10*3/uL (ref 0.7–4.0)
MCH: 31.1 pg (ref 26.0–34.0)
MCHC: 33 g/dL (ref 30.0–36.0)
MCV: 94.2 fL (ref 80.0–100.0)
Monocytes Absolute: 0.5 10*3/uL (ref 0.1–1.0)
Monocytes Relative: 8 %
Neutro Abs: 4.1 10*3/uL (ref 1.7–7.7)
Neutrophils Relative %: 70 %
Platelet Count: 371 10*3/uL (ref 150–400)
RBC: 4.15 MIL/uL (ref 3.87–5.11)
RDW: 12.3 % (ref 11.5–15.5)
WBC Count: 5.8 10*3/uL (ref 4.0–10.5)
nRBC: 0 % (ref 0.0–0.2)

## 2021-05-26 LAB — CMP (CANCER CENTER ONLY)
ALT: 8 U/L (ref 0–44)
AST: 16 U/L (ref 15–41)
Albumin: 4.1 g/dL (ref 3.5–5.0)
Alkaline Phosphatase: 57 U/L (ref 38–126)
Anion gap: 7 (ref 5–15)
BUN: 23 mg/dL (ref 8–23)
CO2: 31 mmol/L (ref 22–32)
Calcium: 9.6 mg/dL (ref 8.9–10.3)
Chloride: 100 mmol/L (ref 98–111)
Creatinine: 0.92 mg/dL (ref 0.44–1.00)
GFR, Estimated: >60 mL/min (ref >60)
Glucose, Bld: 102 mg/dL — ABNORMAL HIGH (ref 70–99)
Potassium: 3.5 mmol/L (ref 3.5–5.1)
Sodium: 138 mmol/L (ref 135–145)
Total Bilirubin: 0.6 mg/dL (ref 0.3–1.2)
Total Protein: 7.5 g/dL (ref 6.5–8.1)

## 2021-05-30 ENCOUNTER — Ambulatory Visit: Payer: Medicare Other | Admitting: Gastroenterology

## 2021-06-01 ENCOUNTER — Inpatient Hospital Stay: Payer: Commercial Managed Care - HMO | Attending: Internal Medicine | Admitting: Internal Medicine

## 2021-06-01 DIAGNOSIS — C3411 Malignant neoplasm of upper lobe, right bronchus or lung: Secondary | ICD-10-CM

## 2021-06-01 NOTE — Progress Notes (Signed)
[place] [phone]   [phone]  PROGRESS NOTE FOR TELEMEDICINE VISITS  Arthur Holms, NP [name]  I connected withNAME@ on 06/01/21 at  9:15 AM EST by telephone visit and verified that I am speaking with the correct person using two identifiers.   I discussed the limitations, risks, security and privacy concerns of performing an evaluation and management service by telemedicine and the availability of in-person appointments. I also discussed with the patient that there may be a patient responsible charge related to this service. The patient expressed understanding and agreed to proceed.  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   Molecular studies by [facility] showed no actionable mutations.   PRIOR THERAPY: SBRT to right and left lung nodules under the care of Dr. Lisbeth Renshaw completed on May 31, 2020.   CURRENT THERAPY: Observation.  INTERVAL HISTORY: Kimberly [name] 66 y.o. female has a telephone virtual visit with me today for evaluation and discussion of her scan results.  The patient is feeling fine with no concerning complaints.  She denied having any current chest pain and her shortness of breath has improved.  She denied having any cough or hemoptysis.  She has no recent weight loss or night sweats.  She is expected to have cataract surgery in few weeks.  She denied having any significant weight loss or night sweats.  She has no nausea, vomiting, diarrhea or constipation.  She is also expected to have thyroid surgery in the near future.  MEDICAL HISTORY: Past Medical History:  Diagnosis Date   Alcohol abuse, in remission    since  around 2956   Alcoholic hepatitis    fatty liver on imaging- Treated Hepatits C and treated   Allergic rhinitis    Anemia    EGD with duodenal AVM, normal colonoscopy 04/2012   Asthma    Callus of foot 2009   Cancer [facility])    Chest pain, musculoskeletal 2011   Cholelithiasis    [place] 09/2008: Cholelithiasis is present, s/p cholecystectomy   Depression    Excessive cerumen in ear canal    since before 2000   GERD (gastroesophageal reflux disease)    Hiatal hernia    History of blood transfusion    Hyperlipidemia    Hypertension    Menopausal syndrome    Treated with estradiol in the past - discontinued 04/2012   MENOPAUSAL SYNDROME 09/24/2006   2008 Note : The pt is adament about having this medication.  She fully understands the increased risk of heart disease and cancer that is associated with HRT and is willing to accept this risk in order to prevent the debilitating hot flashes she has off this medication.  Will continue to encourage her to try to wean herself off of this medication at our next visit.  2009 note indicated that she tr   Otitis externa, acute 06/2010   bilateral, treated with azithromycin after failure of neomycin drops   Otitis media, acute 06/2010   PONV (postoperative nausea and vomiting)     ALLERGIES:  is allergic to contrast media [iodinated contrast media], banana, gabapentin, grape (artificial) flavor, lamotrigine, ibuprofen, and penicillins.  MEDICATIONS:  Current Outpatient Medications  Medication  Sig Dispense Refill   diphenhydrAMINE (BENADRYL) 50 MG tablet Take 1 tablet (50 mg total) by mouth once for 1 dose. Pt to take 50 mg of benadryl on 03/07/21 at 11:20 AM. Please call [phone] with any questions. 1 tablet 0   predniSONE (DELTASONE) 50 MG tablet Take one tablet 13 hours , 1 tablet 7 hours and 1 tablet 1 hour prior to Ct scan. 3 tablet 3   alendronate (FOSAMAX) 70 MG tablet Take 70 mg by mouth once a week.     AMBULATORY NON FORMULARY MEDICATION  Take 30 mLs by mouth in the morning, at noon, in the evening, and at bedtime. Medication Name: 90 ml viscous lidocaine, 90 ml 10mg /10ml dicyclomine, 270 ml maalox Swish and swallow 450 mL 1   Capsaicin-Menthol-Methyl Sal (CAPSAICIN-METHYL SAL-MENTHOL) 0.025-1-12 % CREA Apply 1 each topically in the morning and at bedtime. 56 g 1   cetirizine (ZYRTEC ALLERGY) 10 MG tablet Take 1 tablet (10 mg total) by mouth daily. 30 tablet 0   diphenhydrAMINE (BENADRYL) 50 MG tablet Take 1 tablet (50 mg total) by mouth as directed. -Take 1 tablet  one hour before your CT scan. 1 tablet 3   doxycycline (VIBRAMYCIN) 100 MG capsule Take 1 capsule (100 mg total) by mouth 2 (two) times daily. 14 capsule 0   fluticasone (FLONASE) 50 MCG/ACT nasal spray Use 1-2 sprays per nostril once daily as needed for sinus congestion or allergy symptoms. 16 g 0   fluticasone (FLOVENT HFA) 110 MCG/ACT inhaler Inhale 2 puffs into the lungs 2 (two) times daily. 36 g 1   lidocaine (LIDODERM) 5 % Place 1 patch onto the skin daily. Remove & Discard patch within 12 hours or as directed by MD 30 patch 0   losartan (COZAAR) 25 MG tablet Take 1 tablet (25 mg total) by mouth daily. 90 tablet 3   metroNIDAZOLE (FLAGYL) 500 MG tablet Take 1 tablet (500 mg total) by mouth 2 (two) times daily. 14 tablet 0   predniSONE (DELTASONE) 20 MG tablet Take 2 tablets daily with breakfast. 10 tablet 0   No current facility-administered medications for this visit.    SURGICAL HISTORY:  Past Surgical History:  Procedure Laterality Date   ABDOMINAL HYSTERECTOMY  1980s   BIOPSY  03/18/2019   Procedure: BIOPSY;  Surgeon: Rush Landmark Telford Nab., MD;  Location: [place];  Service: Gastroenterology;;   BRONCHIAL BIOPSY  05/03/2020   Procedure: BRONCHIAL BIOPSIES;  Surgeon: Collene Gobble, MD;  Location: [facility] ENDOSCOPY;  Service: Pulmonary;;   BRONCHIAL BRUSHINGS  05/03/2020   Procedure: BRONCHIAL BRUSHINGS;  Surgeon: Collene Gobble, MD;  Location: [facility]  ENDOSCOPY;  Service: Pulmonary;;   BRONCHIAL NEEDLE ASPIRATION BIOPSY  05/03/2020   Procedure: BRONCHIAL NEEDLE ASPIRATION BIOPSIES;  Surgeon: Collene Gobble, MD;  Location: [facility] ENDOSCOPY;  Service: Pulmonary;;   CHOLECYSTECTOMY  01/06/09   COLONOSCOPY  05/09/2012   Procedure: COLONOSCOPY;  Surgeon: Inda Castle, MD;  Location: [place];  Service: Endoscopy;  Laterality: N/A;   COLONOSCOPY WITH PROPOFOL N/A 03/18/2019   Procedure: COLONOSCOPY WITH PROPOFOL;  Surgeon: Rush Landmark Telford Nab., MD;  Location: [place];  Service: Gastroenterology;  Laterality: N/A;   ENTEROSCOPY N/A 03/18/2019   Procedure: ENTEROSCOPY;  Surgeon: Rush Landmark Telford Nab., MD;  Location: [place];  Service: Gastroenterology;  Laterality: N/A;   ESOPHAGOGASTRODUODENOSCOPY  05/09/2012   Procedure: ESOPHAGOGASTRODUODENOSCOPY (EGD);  Surgeon: Inda Castle, MD;  Location: [place];  Service: Endoscopy;  Laterality: N/A;   FIDUCIAL MARKER PLACEMENT  05/03/2020   Procedure: FIDUCIAL MARKER PLACEMENT;  Surgeon: Collene Gobble, MD;  Location: [facility] ENDOSCOPY;  Service: Pulmonary;;   HEMOSTASIS CLIP PLACEMENT  03/18/2019   Procedure: HEMOSTASIS CLIP PLACEMENT;  Surgeon: Irving Copas., MD;  Location: [place];  Service: Gastroenterology;;   HOT HEMOSTASIS N/A 03/18/2019   Procedure: HOT HEMOSTASIS (ARGON PLASMA COAGULATION/BICAP);  Surgeon: Irving Copas., MD;  Location: [place];  Service: Gastroenterology;  Laterality: N/A;   POLYPECTOMY  03/18/2019   Procedure: POLYPECTOMY;  Surgeon: Rush Landmark Telford Nab., MD;  Location: [place];  Service: Gastroenterology;;   SUBMUCOSAL TATTOO INJECTION  03/18/2019   Procedure: SUBMUCOSAL TATTOO INJECTION;  Surgeon: Irving Copas., MD;  Location: [place];  Service: Gastroenterology;;   VIDEO BRONCHOSCOPY WITH ENDOBRONCHIAL NAVIGATION N/A 05/03/2020   Procedure: VIDEO BRONCHOSCOPY WITH ENDOBRONCHIAL NAVIGATION;  Surgeon: Collene Gobble, MD;  Location: [place] ENDOSCOPY;  Service: Pulmonary;  Laterality: N/A;    REVIEW OF SYSTEMS:  A comprehensive review of systems was negative except for: Respiratory: positive for dyspnea on exertion    LABORATORY DATA: Lab Results  Component Value Date   WBC 5.8 05/26/2021   HGB 12.9 05/26/2021   HCT 39.1 05/26/2021   MCV 94.2 05/26/2021   PLT 371 05/26/2021      Chemistry      Component Value Date/Time   NA 138 05/26/2021 0841   NA 140 02/11/2020 1513   K 3.5 05/26/2021 0841   CL 100 05/26/2021 0841   CO2 31 05/26/2021 0841   BUN 23 05/26/2021 0841   BUN 9 02/11/2020 1513   CREATININE 0.92 05/26/2021 0841   CREATININE 0.76 10/15/2013 1329      Component Value Date/Time   CALCIUM 9.6 05/26/2021 0841   ALKPHOS 57 05/26/2021 0841   AST 16 05/26/2021 0841   ALT 8 05/26/2021 0841   BILITOT 0.6 05/26/2021 0841       RADIOGRAPHIC STUDIES: DG Chest 2 View  Result Date: 05/24/2021 CLINICAL DATA:  Chest pain EXAM: CHEST - 2 VIEW COMPARISON:  Chest x-ray dated May 12, 2021 FINDINGS: Cardiac and mediastinal contours are unchanged. Linear opacities of the right upper lobe and left lower lobe, left lower lobe opacities adjacent to fiducial markers, findings are possibly post treatment changes. No new parenchymal opacity. No pleural effusion or pneumothorax. IMPRESSION: Stable appearance of the chest.  No new parenchymal opacity. Electronically Signed   By: Yetta Glassman M.D.   On: 05/24/2021 08:12   DG Chest 2 View  Result Date: 05/12/2021 CLINICAL DATA:  66 year old female with history of right-sided chest wall pain for the past month. EXAM: CHEST - 2 VIEW COMPARISON:  Chest x-ray 03/13/2021. FINDINGS: Three fiducial markers are again noted in the region of the left lower lobe. Increasing ill-defined nodular architectural distortion in the adjacent lung. Patchy areas of interstitial prominence and ill-defined nodularity are also noted throughout the right upper  lobe, similar to the prior examination. Chronic nodularity in the left upper lobe near the apex also unchanged. No pleural effusions. No pneumothorax. No evidence of pulmonary edema. Heart size is normal. Upper mediastinal contours are within normal limits. IMPRESSION: 1. New ill-defined nodular architectural distortion in the left lower lobe adjacent to the indwelling fiducial markers, potentially related to evolving postradiation changes, although progressive neoplasm or focal infectious airspace consolidation is not excluded. 2. The appearance the chest is otherwise similar to the prior examination. Electronically Signed   By: Vinnie Langton M.D.   On: 05/12/2021 13:01  CT CHEST WO CONTRAST  Result Date: 05/26/2021 CLINICAL DATA:  Primary Cancer Type: Lung Imaging Indication: Routine surveillance Interval therapy since last imaging? No Initial Cancer Diagnosis Date: 05/03/2020; Established by: Biopsy-proven Detailed Pathology: Stage IV multifocal adenocarcinoma. Primary Tumor location: Right lower and upper lobe. Left lower lobe. Surgeries: Cholecystectomy, hysterectomy. Chemotherapy: No Immunotherapy? No Radiation therapy? Yes; date Range: 05/24/2020 - 06/10/2020; Target: Right and left lung EXAM: CT CHEST WITHOUT CONTRAST TECHNIQUE: Multidetector CT imaging of the chest was performed following the standard protocol without IV contrast. COMPARISON:  Most recent CT chest, abdomen and pelvis 02/13/2021. 04/06/2020 PET-CT. FINDINGS: Cardiovascular: Normal heart size. Trace pericardial effusion/thickening, minimally increased. Left anterior descending coronary atherosclerosis. Atherosclerotic nonaneurysmal thoracic aorta. Normal caliber pulmonary arteries. Mediastinum/Nodes: Subcentimeter hypodense left thyroid nodule is not appreciably changed. Not clinically significant; no follow-up imaging recommended (ref: J Am Coll Radiol. 2015 Feb;12(2): 143-50). Unremarkable esophagus. No pathologically enlarged  axillary, mediastinal or hilar lymph nodes, noting limited sensitivity for the detection of hilar adenopathy on this noncontrast study. Lungs/Pleura: No pneumothorax. No pleural effusion. Moderate centrilobular emphysema. Solid 1.4 x 1.1 cm posterior right upper lobe pulmonary nodule (series 5/image 30), previously 1.6 x 1.1 cm, slightly decreased. Surrounding patchy ground-glass opacity and reticulation with some associated volume loss and distortion, slightly increased, favor evolving post treatment change. Irregular subsolid 2.2 x 1.8 cm peripheral right upper lobe pulmonary nodule (series 5/image 27), previously 2.2 x 1.8 cm, stable. Subsolid anterior right lower lobe 0.9 x 0.6 cm pulmonary nodule (series 5/image 61), slightly decreased from 1.1 x 0.6 cm, with slightly increased surrounding patchy ground-glass opacity and bandlike consolidation and slightly increased associated distortion of volume loss, compatible with surrounding evolving post treatment changes. Increased patchy bandlike consolidation surrounding the fiducial marker in the posterior left lower lobe with some associated volume loss and distortion, surrounding and obscuring the treated nodule in this location, favoring evolving post treatment change. New patchy mild tree-in-bud opacity and centrilobular micronodularity in the basilar left lower lobe, favor inflammatory. Several scattered irregular sub solid and ground-glass pulmonary nodules in the left upper lobe, largest 2.2 cm in the peripheral apical left upper lobe (series 5/image 23), all stable. No new significant pulmonary nodules. Upper abdomen: Cholecystectomy. Musculoskeletal:  No aggressive appearing focal osseous lesions. IMPRESSION: 1. Multifocal subsolid bilateral pulmonary nodules are all stable or slightly decreased in size. No findings highly suspicious for new or progressive metastatic disease in the chest. 2. Increasing patchy bandlike consolidation at the site of the treated  nodules in the posterior right upper lobe, anterior right lower lobe and posterior left lower lobe, favoring evolving post treatment changes, warranting continued close chest CT surveillance in 3-6 months. 3. New patchy mild tree-in-bud opacity and centrilobular micronodularity in the basilar left lower lobe, favor inflammatory. 4. No thoracic adenopathy. 5. Trace pericardial effusion, minimally increased. 6. One vessel coronary atherosclerosis. 7. Aortic Atherosclerosis (ICD10-I70.0) and Emphysema (ICD10-J43.9). Electronically Signed   By: Ilona Sorrel M.D.   On: 05/26/2021 12:38    ASSESSMENT AND PLAN: This is a very pleasant 66 years old African-American female with a stage IV (T4, N0, M1 a) multifocal adenocarcinoma involving the right upper lobe, right lower lobe as well as left lower lobe diagnosed in December 2021 with no actionable mutations.  The patient is status post SBRT to the right and left lung nodules under the care of Dr. Lisbeth Renshaw on May 31, 2020.  She has been in observation since that time and she is feeling fine. She had  repeat CT scan of the chest performed recently that showed the multifocal subsolid bilateral pulmonary nodules that are stable to slightly decreased in size with no new findings suspicious for new or progressive metastatic disease. I discussed the scan results with the patient and recommended for her to continue on observation with repeat CT scan of the chest in 6 months. The patient was advised to call immediately if she has any other concerning symptoms in the interval. I discussed the assessment and treatment plan with the patient. The patient was provided an opportunity to ask questions and all were answered. The patient agreed with the plan and demonstrated an understanding of the instructions.   The patient was advised to call back or seek an in-person evaluation if the symptoms worsen or if the condition fails to improve as anticipated.  I provided 15 minutes of  non face-to-face telephone visit time during this encounter, and > 50% was spent counseling as documented under my assessment & plan.  Eilleen Kempf, MD 06/01/2021 9:38 AM  Disclaimer: This note was dictated with voice recognition software. Similar sounding words can inadvertently be transcribed and may not be corrected upon review.

## 2021-06-02 ENCOUNTER — Encounter: Payer: Self-pay | Admitting: Gastroenterology

## 2021-06-02 ENCOUNTER — Ambulatory Visit (INDEPENDENT_AMBULATORY_CARE_PROVIDER_SITE_OTHER): Payer: Commercial Managed Care - HMO | Admitting: Gastroenterology

## 2021-06-02 ENCOUNTER — Other Ambulatory Visit: Payer: Commercial Managed Care - HMO

## 2021-06-02 VITALS — BP 122/80 | HR 75 | Ht 63.0 in | Wt 96.0 lb

## 2021-06-02 DIAGNOSIS — K222 Esophageal obstruction: Secondary | ICD-10-CM

## 2021-06-02 DIAGNOSIS — R1313 Dysphagia, pharyngeal phase: Secondary | ICD-10-CM

## 2021-06-02 DIAGNOSIS — R1319 Other dysphagia: Secondary | ICD-10-CM

## 2021-06-02 DIAGNOSIS — R140 Abdominal distension (gaseous): Secondary | ICD-10-CM | POA: Diagnosis not present

## 2021-06-02 DIAGNOSIS — R141 Gas pain: Secondary | ICD-10-CM

## 2021-06-02 DIAGNOSIS — R1310 Dysphagia, unspecified: Secondary | ICD-10-CM

## 2021-06-02 NOTE — Patient Instructions (Signed)
Your provider has requested that you go to the basement level for lab work before leaving today. Press "B" on the elevator. The lab is located at the first door on the left as you exit the elevator.  You will need a follow-up in 4 months. Office will contact to schedule at a later time.   If you are age 68 or older, your body mass index should be between 23-30. Your Body mass index is 17.01 kg/m. If this is out of the aforementioned range listed, please consider follow up with your Primary Care Provider.  If you are age 29 or younger, your body mass index should be between 19-25. Your Body mass index is 17.01 kg/m. If this is out of the aformentioned range listed, please consider follow up with your Primary Care Provider.   ________________________________________________________  The [facility] providers would like to encourage you to use [facility] to communicate with providers for non-urgent requests or questions.  Due to long hold times on the telephone, sending your provider a message by [facility] may be a faster and more efficient way to get a response.  Please allow 48 business hours for a response.  Please remember that this is for non-urgent requests.  _______________________________________________________  Due to recent changes in healthcare laws, you may see the results of your imaging and laboratory studies on MyChart before your provider has had a chance to review them.  We understand that in some cases there may be results that are confusing or concerning to you. Not all laboratory results come back in the same time frame and the provider may be waiting for multiple results in order to interpret others.  Please give [place] 48 hours in order for your provider to thoroughly review all the results before contacting the office for clarification of your results.   Thank you for choosing me and [facility].  Dr. Rush Landmark

## 2021-06-02 NOTE — Progress Notes (Signed)
[place] VISIT   Primary Care Provider Arthur Holms, NP [place] [address] [phone]  Patient Profile: Kimberly [name] is a 66 y.o. female with a pmh significant for multifocal lung cancer (status post SBRT), prior alcohol abuse (in cessation since 2000 08/2003), asthma, status post cholecystectomy, GERD, hypertension, hyperlipidemia, small bowel AVMs, iron deficiency anemia, cervical web, pharyngeal dysphagia.  The patient presents to the [facility] for an evaluation and management of problem(s) noted below:  Problem List 1. Esophageal dysphagia   2. Pharyngeal dysphagia   3. Cervical web   4. Bloating   5. Abdominal gas pain     History of Present Illness Please see prior notes for full details of HPI.  Interval History The patient returns for scheduled follow-up.  She had to cancel her last visit with [place] due to being ill.  From a swallowing standpoint she has had significant improvement after her dilation.  She did have significant discomfort after the dilation.  She was prescribed a GI cocktail but does not feel that is helpful and so she has stopped that.  She continues to take PPI therapy.  Patient still has issues of upper chest bloating/gas as well as lower abdominal bloating/gas.  She takes Gas-X (2 pills) in the morning and that usually helps her for the rest the day but towards the end of the afternoon/evening she begins to feel sensation again.  She has not taken more than 2 pills of Gas-X at a time on a single day.  Patient's weight is stable.  She is happy to hear that her lung cancer is stable currently.  She is not experiencing significant abdominal pain.  No changes in her bowel habits.  GI Review of Systems Positive as above Negative for nausea, vomiting, melena, hematochezia  Review of Systems General: Denies fevers/chills/weight loss unintentionally Cardiovascular: Denies chest pain Pulmonary:  Denies shortness of breath Gastroenterological: See HPI Genitourinary: Denies darkened urine Hematological: Denies easy bruising/bleeding Dermatological: Denies jaundice Psychological: Mood is stable   Medications Current Outpatient Medications  Medication Sig Dispense Refill   alendronate (FOSAMAX) 70 MG tablet Take 70 mg by mouth once a week.     amitriptyline (ELAVIL) 25 MG tablet Take 25 mg by mouth at bedtime.     Cholecalciferol (D3-1000) 25 MCG (1000 UT) capsule Take 1,000 Units by mouth daily.     FEROSUL 325 (65 Fe) MG tablet Take 325 mg by mouth daily.     fluticasone (FLONASE) 50 MCG/ACT nasal spray Use 1-2 sprays per nostril once daily as needed for sinus congestion or allergy symptoms. 16 g 0   fluticasone (FLOVENT HFA) 110 MCG/ACT inhaler Inhale 2 puffs into the lungs 2 (two) times daily. 36 g 1   losartan (COZAAR) 50 MG tablet Take 50 mg by mouth daily.     Multiple Vitamins-Minerals (EMERGEN-C VITAMIN C) PACK Take 1 Package by mouth daily.     pantoprazole (PROTONIX) 40 MG tablet Take 40 mg by mouth 2 (two) times daily.     Simethicone (GAS-X EXTRA STRENGTH) 125 MG CAPS Take 2 capsules by mouth daily.     vitamin B-12 (CYANOCOBALAMIN) 1000 MCG tablet Take 1,000 mcg by mouth daily.     No current facility-administered medications for this visit.    Allergies Allergies  Allergen Reactions   Contrast Media [Iodinated Contrast Media] Itching   Banana     Lip swelling   Gabapentin Hives   Grape (Artificial) Flavor Swelling  Allergic to grapes   Lamotrigine Hives   Ibuprofen Rash   Penicillins Rash    Did it involve swelling of the face/tongue/throat, SOB, or low BP? No Did it involve sudden or severe rash/hives, skin peeling, or any reaction on the inside of your mouth or nose? No Did you need to seek medical attention at a hospital or doctor's office? No When did it last happen?      more than 10 years If all above answers are NO, may proceed with  cephalosporin use.     Histories Past Medical History:  Diagnosis Date   Alcohol abuse, in remission    since around 2297   Alcoholic hepatitis    fatty liver on imaging- Treated Hepatits C and treated   Allergic rhinitis    Anemia    EGD with duodenal AVM, normal colonoscopy 04/2012   Asthma    Callus of foot 2009   Cancer [facility])    Chest pain, musculoskeletal 2011   Cholelithiasis    [place] 09/2008: Cholelithiasis is present, s/p cholecystectomy   Depression    Excessive cerumen in ear canal    since before 2000   GERD (gastroesophageal reflux disease)    Hiatal hernia    History of blood transfusion    Hyperlipidemia    Hypertension    Menopausal syndrome    Treated with estradiol in the past - discontinued 04/2012   MENOPAUSAL SYNDROME 09/24/2006   2008 Note : The pt is adament about having this medication.  She fully understands the increased risk of heart disease and cancer that is associated with HRT and is willing to accept this risk in order to prevent the debilitating hot flashes she has off this medication.  Will continue to encourage her to try to wean herself off of this medication at our next visit.  2009 note indicated that she tr   Otitis externa, acute 06/2010   bilateral, treated with azithromycin after failure of neomycin drops   Otitis media, acute 06/2010   PONV (postoperative nausea and vomiting)    Past Surgical History:  Procedure Laterality Date   ABDOMINAL HYSTERECTOMY  1980s   BIOPSY  03/18/2019   Procedure: BIOPSY;  Surgeon: Irving Copas., MD;  Location: [place];  Service: Gastroenterology;;   BRONCHIAL BIOPSY  05/03/2020   Procedure: BRONCHIAL BIOPSIES;  Surgeon: Collene Gobble, MD;  Location: [facility] ENDOSCOPY;  Service: Pulmonary;;   BRONCHIAL BRUSHINGS  05/03/2020   Procedure: BRONCHIAL BRUSHINGS;  Surgeon: Collene Gobble, MD;  Location: [facility] ENDOSCOPY;  Service: Pulmonary;;   BRONCHIAL NEEDLE ASPIRATION BIOPSY  05/03/2020   Procedure:  BRONCHIAL NEEDLE ASPIRATION BIOPSIES;  Surgeon: Collene Gobble, MD;  Location: [facility] ENDOSCOPY;  Service: Pulmonary;;   CHOLECYSTECTOMY  01/06/09   COLONOSCOPY  05/09/2012   Procedure: COLONOSCOPY;  Surgeon: Inda Castle, MD;  Location: [facility] ENDOSCOPY;  Service: Endoscopy;  Laterality: N/A;   COLONOSCOPY WITH PROPOFOL N/A 03/18/2019   Procedure: COLONOSCOPY WITH PROPOFOL;  Surgeon: Rush Landmark Telford Nab., MD;  Location: [place];  Service: Gastroenterology;  Laterality: N/A;   ENTEROSCOPY N/A 03/18/2019   Procedure: ENTEROSCOPY;  Surgeon: Rush Landmark Telford Nab., MD;  Location: [place];  Service: Gastroenterology;  Laterality: N/A;   ESOPHAGOGASTRODUODENOSCOPY  05/09/2012   Procedure: ESOPHAGOGASTRODUODENOSCOPY (EGD);  Surgeon: Inda Castle, MD;  Location: [place];  Service: Endoscopy;  Laterality: N/A;   FIDUCIAL MARKER PLACEMENT  05/03/2020   Procedure: FIDUCIAL MARKER PLACEMENT;  Surgeon: Collene Gobble, MD;  Location: [facility]  ENDOSCOPY;  Service: Pulmonary;;   HEMOSTASIS CLIP PLACEMENT  03/18/2019   Procedure: HEMOSTASIS CLIP PLACEMENT;  Surgeon: Irving Copas., MD;  Location: [place];  Service: Gastroenterology;;   HOT HEMOSTASIS N/A 03/18/2019   Procedure: HOT HEMOSTASIS (ARGON PLASMA COAGULATION/BICAP);  Surgeon: Irving Copas., MD;  Location: [place];  Service: Gastroenterology;  Laterality: N/A;   POLYPECTOMY  03/18/2019   Procedure: POLYPECTOMY;  Surgeon: Rush Landmark Telford Nab., MD;  Location: [place];  Service: Gastroenterology;;   SUBMUCOSAL TATTOO INJECTION  03/18/2019   Procedure: SUBMUCOSAL TATTOO INJECTION;  Surgeon: Irving Copas., MD;  Location: [place];  Service: Gastroenterology;;   VIDEO BRONCHOSCOPY WITH ENDOBRONCHIAL NAVIGATION N/A 05/03/2020   Procedure: VIDEO BRONCHOSCOPY WITH ENDOBRONCHIAL NAVIGATION;  Surgeon: Collene Gobble, MD;  Location: [place] ENDOSCOPY;  Service: Pulmonary;  Laterality: N/A;   Social History    Socioeconomic History   Marital status: Single    Spouse name: Not on file   Number of children: 2   Years of education: 47   Highest education level: Not on file  Occupational History   Not on file  Tobacco Use   Smoking status: Former    Types: Cigarettes    Quit date: 08/21/1995    Years since quitting: 25.8   Smokeless tobacco: Never   Tobacco comments:    Patient states she stopped smoking in 31 yrs ago (as of 2022)  Vaping Use   Vaping Use: Never used  Substance and Sexual Activity   Alcohol use: No    Comment: in remission since 2007   Drug use: Yes    Types: Marijuana    Comment: occ   Sexual activity: Not on file  Other Topics Concern   Not on file  Social History Narrative   Right handed   Caffeine use: none   Lives alone   Social Determinants of Health   Financial Resource Strain: Not on file  Food Insecurity: Not on file  Transportation Needs: Not on file  Physical Activity: Not on file  Stress: Not on file  Social Connections: Not on file  Intimate Partner Violence: Not on file   Family History  Problem Relation Age of Onset   Hypertension Mother    Colon cancer Neg Hx    Esophageal cancer Neg Hx    Inflammatory bowel disease Neg Hx    Liver disease Neg Hx    Pancreatic cancer Neg Hx    Rectal cancer Neg Hx    Stomach cancer Neg Hx    I have reviewed her medical, social, and family history in detail and updated the electronic medical record as necessary.    PHYSICAL EXAMINATION  BP 122/80    Pulse 75    Ht 5\' 3"  (1.6 m)    Wt 96 lb (43.5 kg)    LMP 08/21/1982    BMI 17.01 kg/m  Wt Readings from Last 3 Encounters:  06/02/21 96 lb (43.5 kg)  03/02/21 91 lb 0.8 oz (41.3 kg)  02/15/21 91 lb 2 oz (41.3 kg)  GEN: NAD, appears stated age, doesn't appear chronically ill PSYCH: Cooperative, without pressured speech EYE: Conjunctivae pink, sclerae anicteric ENT: MMM CV: Nontachycardic RESP: No audible wheezing GI: NABS, soft, NT/ND, without  rebound MSK/EXT: No lower extremity edema SKIN: No jaundice NEURO:  Alert & Oriented x 3, no focal deficits   REVIEW OF DATA  I reviewed the following data at the time of this encounter:  GI Procedures and Studies  September 2022 EGD -  No gross lesions in esophagus. Biopsied. - Widely patent Schatzki ring. Disrupted. - Dilation performed in the esophagus. Mucosal wrent noted just below the UES. - 3 cm hiatal hernia. - Gastritis in antrum. No other gross lesions in the stomach. Biopsied. - No gross lesions in the duodenal bulb, in the first portion of the duodenum and in the second portion of the duodenum. Biopsied.  Pathology Diagnosis 1. Surgical [P], duodenal - BENIGN DUODENAL MUCOSA - NO ACUTE INFLAMMATION, VILLOUS BLUNTING OR INCREASED INTRAEPITHELIAL LYMPHOCYTES IDENTIFIED 2. Surgical [P], gastric - MILD CHRONIC GASTRITIS WITH REACTIVE CHANGES - NO H. PYLORI OR INTESTINAL METAPLASIA IDENTIFIED - SEE COMMENT 3. Surgical [P], esophageal - BENIGN SQUAMOUS MUCOSA - NO INCREASED INTRAEPITHELIAL EOSINOPHILS 4. Surgical [P], Schatzki ring - SQUAMOCOLUMNAR JUNCTION WITH CHRONIC INFLAMMATION - NO INTESTINAL METAPLASIA, DYSPLASIA OR MALIGNANCY IDENTIFIED  Laboratory Studies  Reviewed those in epic  Imaging Studies  September 2022 modified barium swallow and SLP evaluation Pt presents with functional oropharyngeal swallow, though with some impairment in timing of airway closure, which places pt at a mild aspiration risk. As a result of delayed laryngeal vestibule closure, thin liquids via cup/straw were penetrated above the cords on majority of trials, with full ejection of material from airway (PAS 2). She experienced x1 isolated incidence of trace penetration to the vocal cords with sip of thin taken with barium pill. Full ejection was also observed via fluoro in this instance (PAS 4). Mild residuals in pyriform sinuses were cleared via spontaneous dry swallow. Whole pill passed  cervical esophagus without difficulty. Recommend regular, thin liquid diet with softer meal items as preferred by pt given absent lower dentition. Universal swallow precautions also recommended to decrease risk for aspiration (upright positioning for meals and 30 min after, small bites/sips, slow rate of intake, etc). No further SLP f/u warranted at this time.   ASSESSMENT  Ms. Salmi is a 66 y.o. female with a pmh significant for multifocal lung cancer (status post SBRT), prior alcohol abuse (in cessation since 2000 08/2003), asthma, status post cholecystectomy, GERD, hypertension, hyperlipidemia, small bowel AVMs, iron deficiency anemia, cervical web, pharyngeal dysphagia.  The patient is seen today for evaluation and management of:  1. Esophageal dysphagia   2. Pharyngeal dysphagia   3. Cervical web   4. Bloating   5. Abdominal gas pain    The patient is clinically and hemodynamically stable.  I believe she has both pharyngeal and esophageal dysphagia symptoms.  After few weeks time, the patient has had significant improvement in her swallowing after our esophageal dilation of her cervical web.  Although her SLP evaluation suggests some contribution of pharyngeal dysphagia I think she is in a much better place and she has gained weight.  If in the future she has recurrent issues, I would likely consider a repeat dilation.  Patient still having upper and lower bloating and gas.  She has found that Gas-X helps her.  I have asked her to increase that to up to 4 pills daily.  She will try this and see how she does.  We discussed the potential role of EPI evaluation as well as SIBO breath testing.  We will move forward with EPI testing for now.  Thankfully she has no evidence of recurrent anemia at this time, so hopefully will not need any further endoscopic evaluation.  All patient questions were answered to the best of my ability, and the patient agrees to the aforementioned plan of action with follow-up  as indicated.   PLAN  Obtain fecal elastase testing Consider SIBO breath testing in future Continue oral iron daily Continue PPI twice daily for now and consider decreasing to once daily in future Gas-X 2 to 4 pills daily as needed or scheduled If dysphagia symptoms recur, would likely perform repeat dilation since she has had improvement prior to manometry   Orders Placed This Encounter  Procedures   Pancreatic elastase, fecal    New Prescriptions   No medications on file   Modified Medications   No medications on file    Planned Follow Up Return in about 4 months (around 09/30/2021).   Total Time in Face-to-Face and in Coordination of Care for patient including independent/personal interpretation/review of prior testing, medical history, examination, medication adjustment, communicating results with the patient directly, and documentation within the EHR is 25 minutes.   Justice Britain, MD [facility] Office # [phone]

## 2021-06-03 DIAGNOSIS — R1313 Dysphagia, pharyngeal phase: Secondary | ICD-10-CM | POA: Insufficient documentation

## 2021-06-03 DIAGNOSIS — R140 Abdominal distension (gaseous): Secondary | ICD-10-CM | POA: Insufficient documentation

## 2021-06-03 DIAGNOSIS — R1319 Other dysphagia: Secondary | ICD-10-CM | POA: Insufficient documentation

## 2021-06-03 DIAGNOSIS — K222 Esophageal obstruction: Secondary | ICD-10-CM | POA: Insufficient documentation

## 2021-06-06 ENCOUNTER — Other Ambulatory Visit: Payer: Commercial Managed Care - HMO

## 2021-06-06 DIAGNOSIS — R1319 Other dysphagia: Secondary | ICD-10-CM

## 2021-06-06 DIAGNOSIS — R140 Abdominal distension (gaseous): Secondary | ICD-10-CM

## 2021-06-06 DIAGNOSIS — R141 Gas pain: Secondary | ICD-10-CM

## 2021-06-13 LAB — PANCREATIC ELASTASE, FECAL: Pancreatic Elastase-1, Stool: >500 mcg/g

## 2021-06-16 ENCOUNTER — Telehealth: Payer: Self-pay | Admitting: Gastroenterology

## 2021-06-16 NOTE — Telephone Encounter (Signed)
error 

## 2021-06-16 NOTE — Telephone Encounter (Signed)
Fecal elastase makes exocrine pancreas insufficiency unlikely.

## 2021-06-16 NOTE — Telephone Encounter (Signed)
The pt has been advised of the lab results. She will continue PPI BID and gas x as needed.  She will call if she has any further complaints.

## 2021-06-16 NOTE — Telephone Encounter (Signed)
Patient called requesting lab results

## 2021-06-19 ENCOUNTER — Other Ambulatory Visit: Payer: Self-pay | Admitting: Physician Assistant

## 2021-06-30 ENCOUNTER — Emergency Department (HOSPITAL_COMMUNITY)
Admission: EM | Admit: 2021-06-30 | Discharge: 2021-06-30 | Disposition: A | Discharge status: Home / Self Care | Payer: Medicare Other | Attending: Emergency Medicine | Admitting: Emergency Medicine

## 2021-06-30 ENCOUNTER — Encounter (HOSPITAL_COMMUNITY): Payer: Self-pay

## 2021-06-30 ENCOUNTER — Other Ambulatory Visit: Payer: Self-pay

## 2021-06-30 DIAGNOSIS — Z7951 Long term (current) use of inhaled steroids: Secondary | ICD-10-CM | POA: Diagnosis not present

## 2021-06-30 DIAGNOSIS — R519 Headache, unspecified: Secondary | ICD-10-CM | POA: Diagnosis not present

## 2021-06-30 DIAGNOSIS — G629 Polyneuropathy, unspecified: Secondary | ICD-10-CM | POA: Diagnosis present

## 2021-06-30 DIAGNOSIS — R448 Other symptoms and signs involving general sensations and perceptions: Secondary | ICD-10-CM

## 2021-06-30 DIAGNOSIS — I10 Essential (primary) hypertension: Secondary | ICD-10-CM | POA: Diagnosis not present

## 2021-06-30 DIAGNOSIS — Z79899 Other long term (current) drug therapy: Secondary | ICD-10-CM | POA: Diagnosis not present

## 2021-06-30 DIAGNOSIS — J45909 Unspecified asthma, uncomplicated: Secondary | ICD-10-CM | POA: Insufficient documentation

## 2021-06-30 NOTE — Discharge Instructions (Addendum)
Please head over to your ophthalmologist for your appointment.

## 2021-06-30 NOTE — ED Provider Notes (Signed)
[facility] EMERGENCY DEPARTMENT Provider Note   CSN: [id number] Arrival date & time: 06/30/21  0545     History  Chief Complaint  Patient presents with   Peripheral Neuropathy    Kimberly [name] is a 66 y.o. female with a past medical history of hypertension, asthma, GERD, arthritis and lung mass presenting today due to facial pressure.  She reports that she had right-sided cataract surgery yesterday.  She had a lot of pressure in her face prior to the surgery however it has resolved due to the operation.  Reports that she needed the eye doctor for a year.  Last night she was experiencing some pressure in her face, called her eye doctor who told her to put rice in a sock and microwave this and put it on her face.  She says that this helped her somewhat.  She is wondering if she may have sinus infection.  Reports that she has an appointment with her ophthalmologist in 10 minutes across the street.  Denies any diplopia, pain with EOMs, visual loss, blurred vision or eye pain.  HPI     Home Medications Prior to Admission medications   Medication Sig Start Date End Date Taking? Authorizing Provider  alendronate (FOSAMAX) 70 MG tablet Take 70 mg by mouth once a week. 01/02/21   [provider]  amitriptyline (ELAVIL) 25 MG tablet Take 25 mg by mouth at bedtime. 04/18/21   [provider]  Cholecalciferol (D3-1000) 25 MCG (1000 UT) capsule Take 1,000 Units by mouth daily.    [provider]  FEROSUL 325 (65 Fe) MG tablet Take 325 mg by mouth daily. 03/17/21   [provider]  fluticasone (FLONASE) 50 MCG/ACT nasal spray Use 1-2 sprays per nostril once daily as needed for sinus congestion or allergy symptoms. 09/20/20   Jeralyn Bennett, MD  fluticasone (FLOVENT HFA) 110 MCG/ACT inhaler Inhale 2 puffs into the lungs 2 (two) times daily. 09/20/20   Jeralyn Bennett, MD  losartan (COZAAR) 50 MG tablet Take 50 mg by mouth daily. 05/24/21    [provider]  Multiple Vitamins-Minerals (EMERGEN-C VITAMIN C) PACK Take 1 Package by mouth daily.    [provider]  pantoprazole (PROTONIX) 40 MG tablet TAKE 1 TABLET BY MOUTH TWICE DAILY BEFORE A MEAL 06/20/21   Mansouraty, Telford Nab., MD  Simethicone (GAS-X EXTRA STRENGTH) 125 MG CAPS Take 2 capsules by mouth daily.    [provider]  vitamin B-12 (CYANOCOBALAMIN) 1000 MCG tablet Take 1,000 mcg by mouth daily.    [provider]      Allergies    Contrast media [iodinated contrast media], Banana, Gabapentin, Grape (artificial) flavor, Lamotrigine, Ibuprofen, and Penicillins    Review of Systems   Review of Systems  Physical Exam Updated Vital Signs BP (!) 170/82    Pulse 70    Temp 99 F (37.2 C) (Oral)    Resp 17    Ht 5\' 4"  (1.626 m)    Wt 47.6 kg    LMP 08/21/1982    SpO2 100%    BMI 18.02 kg/m  Physical Exam Vitals and nursing note reviewed.  Constitutional:      General: She is not in acute distress.    Appearance: Normal appearance. She is not ill-appearing.  HENT:     Head: Normocephalic and atraumatic.  Eyes:     General: No scleral icterus.    Conjunctiva/sclera: Conjunctivae normal.     Pupils: Pupils are equal, round,  and reactive to light.  Pulmonary:     Effort: Pulmonary effort is normal. No respiratory distress.  Skin:    Findings: No rash.  Neurological:     Mental Status: She is alert.  Psychiatric:        Mood and Affect: Mood normal.    ED Results / Procedures / Treatments   Labs (all labs ordered are listed, but only abnormal results are displayed) Labs Reviewed - No data to display  EKG None  Radiology No results found.  Procedures Procedures    Medications Ordered in ED Medications - No data to display  ED Course/ Medical Decision Making/ A&P                           Medical Decision Making  66 year old female presenting due to facial pressure and arthritis.  She reports that this has  resolved.  Has an appointment with the provider who performed her cataract surgery yesterday.  When I walked in the room patient was packing up her bags and waiting to go home.  Says that she has a postsurgical appointment with her ophthalmologist that she is scheduled yesterday.  Physical exam benign.  Patient is unable to focus on history taking and brings up many unrelated stories and past medical history.  Does report that she has a history of hypertension and just took her losartan.  Slightly hypertensive however stable and ambulatory.  Will be discharged at this time with follow-up with ophthalmology at 8 AM.  Final Clinical Impression(s) / ED Diagnoses Final diagnoses:  Facial pressure    Rx / DC Orders Results and diagnoses were explained to the patient. Return precautions discussed in full. Patient had no additional questions and expressed complete understanding.   This chart was dictated using voice recognition software.  Despite best efforts to proofread,  errors can occur which can change the documentation meaning.    Rhae Hammock, PA-C 06/30/21 [place], [place], DO 06/30/21 [phone]

## 2021-06-30 NOTE — ED Triage Notes (Signed)
Pt bib [facility] from home complaining of pressure from her L eye going all the way down the  side of her body. Pt had cataract surgery on R eye yesterday. Hx of neuropathy. BP 140/100 with EMS.

## 2021-07-04 ENCOUNTER — Other Ambulatory Visit: Payer: Self-pay

## 2021-08-16 ENCOUNTER — Other Ambulatory Visit: Payer: Self-pay | Admitting: Registered Nurse

## 2021-08-16 DIAGNOSIS — Z1231 Encounter for screening mammogram for malignant neoplasm of breast: Secondary | ICD-10-CM

## 2021-09-15 ENCOUNTER — Ambulatory Visit
Admission: RE | Admit: 2021-09-15 | Discharge: 2021-09-15 | Disposition: A | Discharge status: Home / Self Care | Payer: Medicaid Other | Source: Ambulatory Visit | Attending: Registered Nurse | Admitting: Registered Nurse

## 2021-09-15 DIAGNOSIS — Z1231 Encounter for screening mammogram for malignant neoplasm of breast: Secondary | ICD-10-CM

## 2021-10-03 IMAGING — CT CT CHEST SUPER D W/O CM
2 of 5 series · 14 of 36 positions shown, 17 images · non-contrast
Comparison: Multiple priors, most recently PET-CT 04/06/2020. Chest
CT 02/27/2020.

CLINICAL DATA: 64-year-old female with history of pulmonary
nodules. Pre-surgical planning.

EXAM:
CT CHEST WITHOUT CONTRAST
TECHNIQUE: Multidetector CT imaging of the chest was performed using thin slice
collimation for electromagnetic bronchoscopy planning purposes,
without intravenous contrast.

[Series 4: thins · axial · 0.57mm/px · z∈[+1186,+1452]mm · 11 of 317 slices shown, 14 images]
[im 25/317  mediastinal]
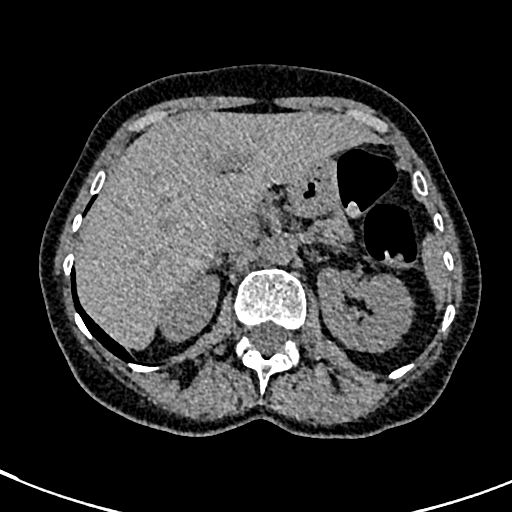
[im 25/317  lung]
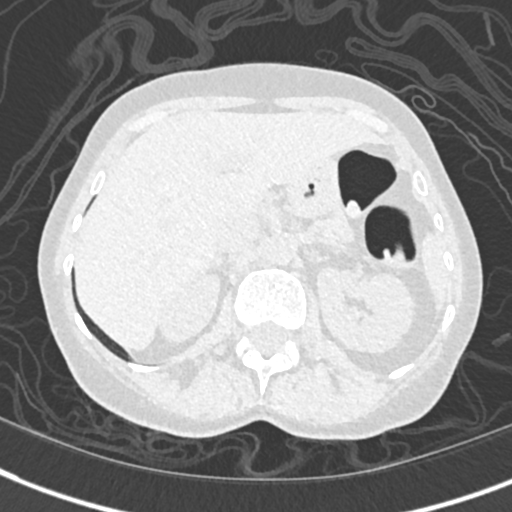
[im 49/317  lung]
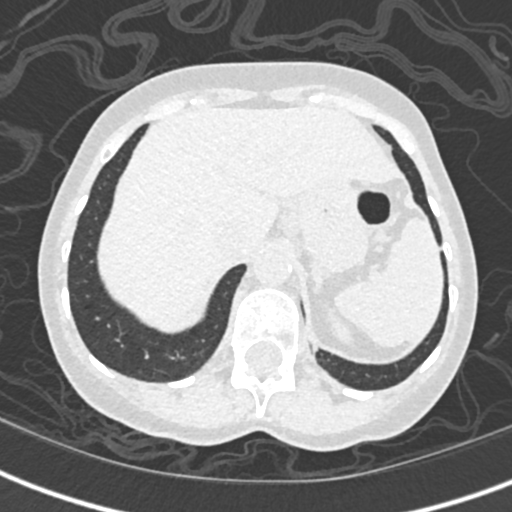
[im 73/317  lung]
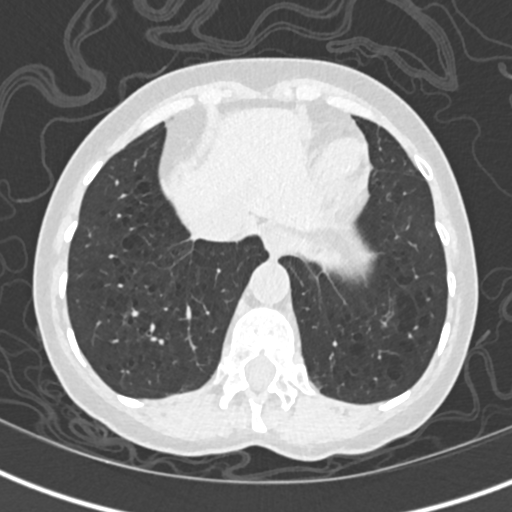
[im 98/317  lung]
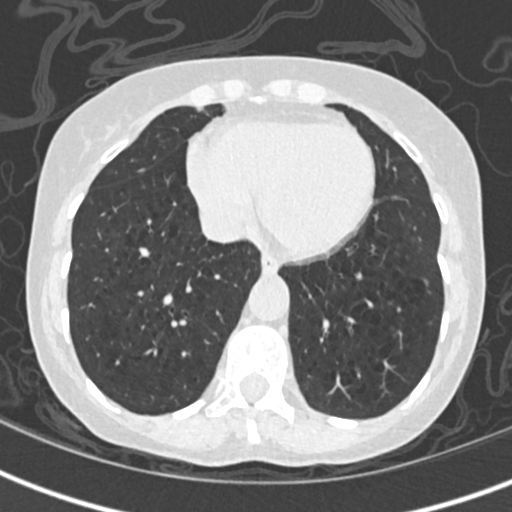
[im 122/317  mediastinal]
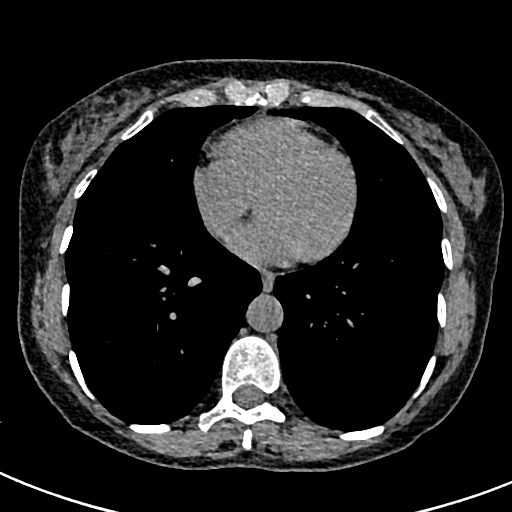
[im 122/317  lung]
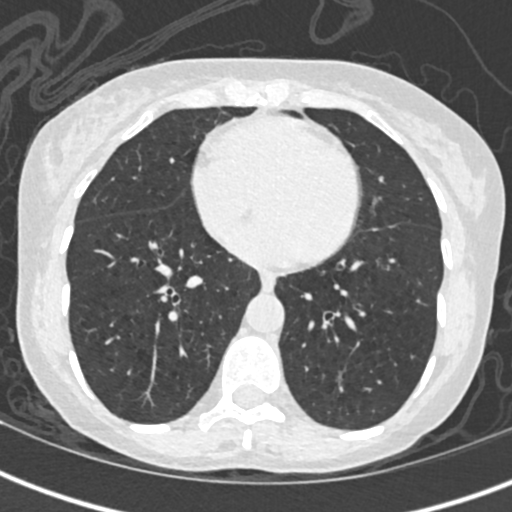
[im 171/317  lung]
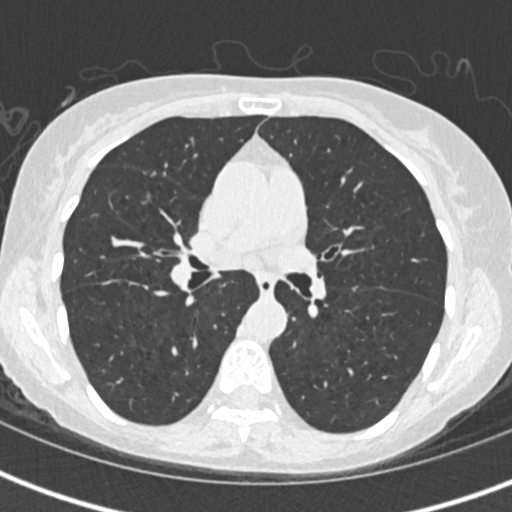
[im 195/317  lung]
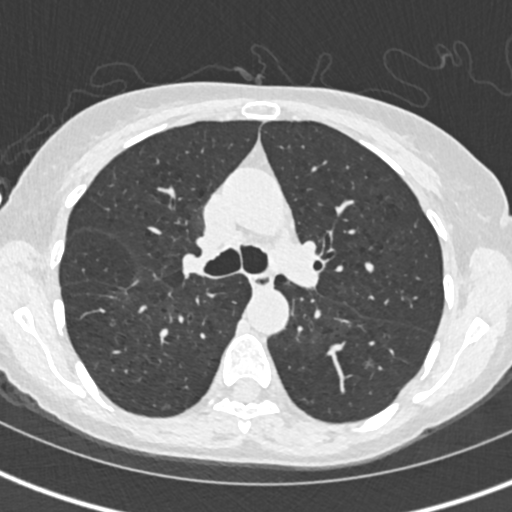
[im 219/317  lung]
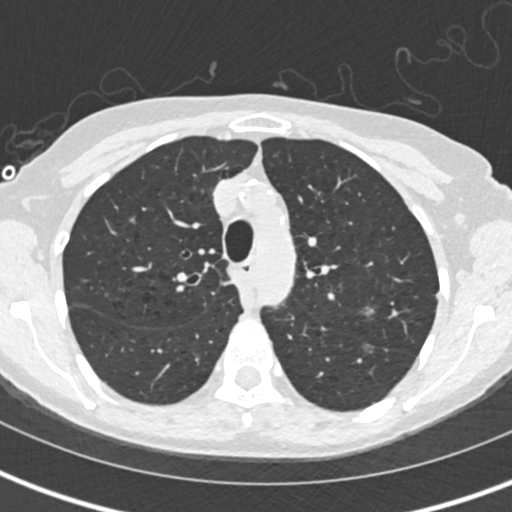
[im 244/317  mediastinal]
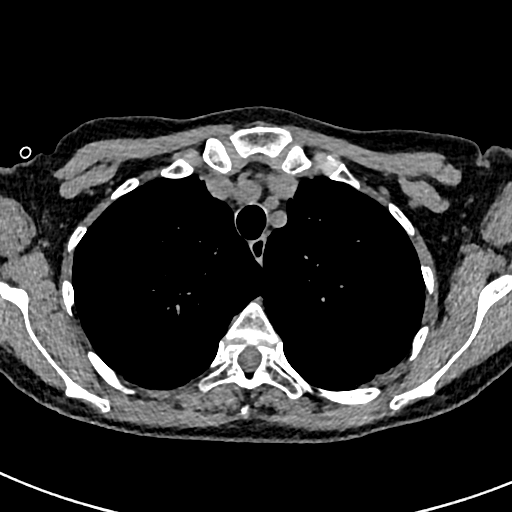
[im 244/317  lung]
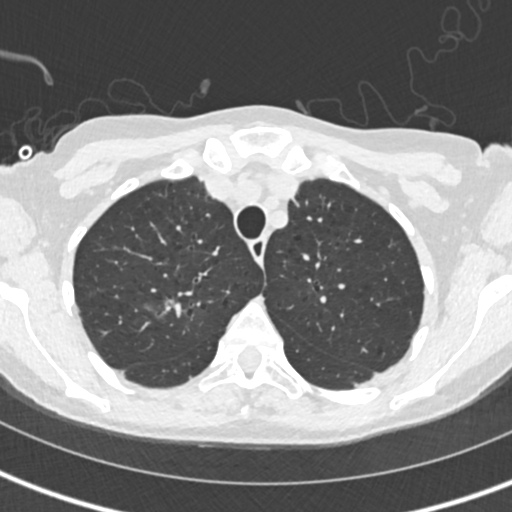
[im 268/317  lung]
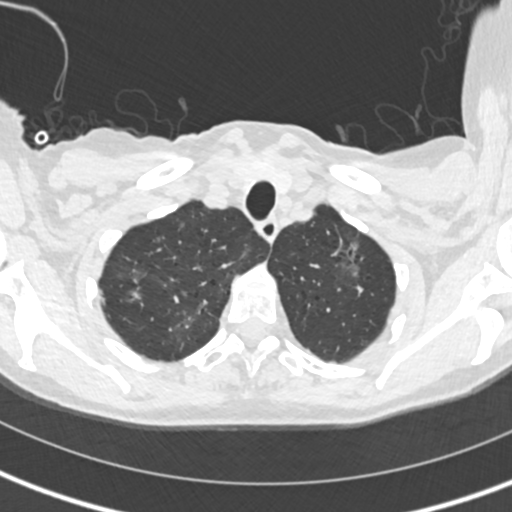
[im 292/317  lung]
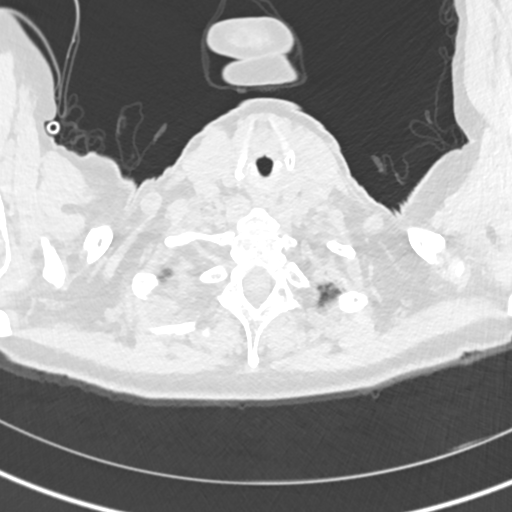

[Series 6: cor · coronal · 0.58mm/px · 3 of 112 slices shown]
[im 23/112  lung]
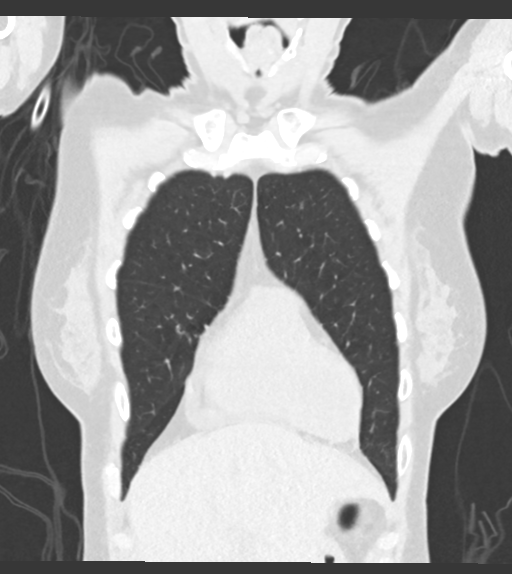
[im 45/112  lung]
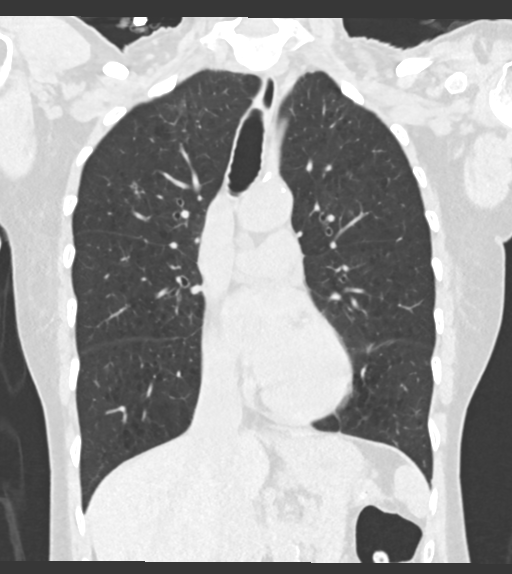
[im 67/112  lung]
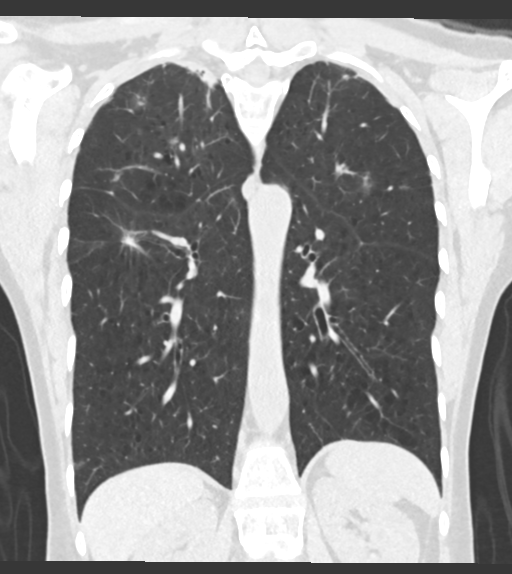

[14 of 36 positions shown; findings below may reference images not displayed]

FINDINGS: Cardiovascular: Heart size is normal. There is no significant
pericardial fluid, thickening or pericardial calcification. There is
aortic atherosclerosis, as well as atherosclerosis of the great
vessels of the mediastinum and the coronary arteries, including
calcified atherosclerotic plaque in the left main coronary artery.

Mediastinum/Nodes: No pathologically enlarged mediastinal or hilar
lymph nodes. Please note that accurate exclusion of hilar adenopathy
is limited on noncontrast CT scans. Esophagus is unremarkable in
appearance. No axillary lymphadenopathy.

Lungs/Pleura: Once again there are multiple solid and sub solid
pulmonary nodules scattered throughout the lungs bilaterally, most
evident throughout the mid to upper lungs. The largest of these
nodules is a solid nodule in the apex of the right upper lobe
posteriorly (axial image 29 of series 5 and coronal image 75 of
series 6) measuring 1.8 x 0.9 x 1.1 cm. Another prominent solid
nodule is in the anterior aspect of the right lower lobe associated
with the right major fissure (axial image 66 of series 5) measuring
1.3 x 0.8 cm. Other previously noted nodules are also similar to
prior examinations. No definite new nodules, consolidative airspace
disease or pleural effusions. Diffuse bronchial wall thickening with
mild centrilobular and paraseptal emphysema. Bilateral apical
nodular areas of pleuroparenchymal thickening and architectural
distortion, similar to prior examinations, most compatible with
areas of chronic post infectious or inflammatory scarring.

Upper Abdomen: Aortic atherosclerosis.

Musculoskeletal: There are no aggressive appearing lytic or blastic
lesions noted in the visualized portions of the skeleton.
IMPRESSION: 1. Multiple solid and subsolid pulmonary nodules in the lungs
bilaterally, stable compared to recent prior studies, as above.
2. Aortic atherosclerosis, in addition to left main coronary artery
disease. Please note that although the presence of coronary artery
calcium documents the presence of coronary artery disease, the
severity of this disease and any potential stenosis cannot be
assessed on this non-gated CT examination. Assessment for potential
risk factor modification, dietary therapy or pharmacologic therapy
may be warranted, if clinically indicated.
3. Mild diffuse bronchial wall thickening with mild centrilobular
and paraseptal emphysema; imaging findings suggestive of underlying
COPD.

Aortic Atherosclerosis (Z0OF4-3K5.5) and Emphysema (Z0OF4-ZRN.O).

## 2021-11-28 ENCOUNTER — Emergency Department (HOSPITAL_COMMUNITY)
Admission: EM | Admit: 2021-11-28 | Discharge: 2021-11-28 | Disposition: A | Discharge status: Home / Self Care | Payer: Medicare Other | Attending: Emergency Medicine | Admitting: Emergency Medicine

## 2021-11-28 ENCOUNTER — Other Ambulatory Visit: Payer: Self-pay

## 2021-11-28 ENCOUNTER — Encounter (HOSPITAL_COMMUNITY): Payer: Self-pay

## 2021-11-28 DIAGNOSIS — J019 Acute sinusitis, unspecified: Secondary | ICD-10-CM | POA: Insufficient documentation

## 2021-11-28 DIAGNOSIS — Z79899 Other long term (current) drug therapy: Secondary | ICD-10-CM | POA: Diagnosis not present

## 2021-11-28 DIAGNOSIS — Z20822 Contact with and (suspected) exposure to covid-19: Secondary | ICD-10-CM | POA: Insufficient documentation

## 2021-11-28 DIAGNOSIS — I10 Essential (primary) hypertension: Secondary | ICD-10-CM | POA: Insufficient documentation

## 2021-11-28 DIAGNOSIS — R0981 Nasal congestion: Secondary | ICD-10-CM | POA: Diagnosis present

## 2021-11-28 LAB — CBC WITH DIFFERENTIAL/PLATELET
Abs Immature Granulocytes: 0.01 10*3/uL (ref 0.00–0.07)
Basophils Absolute: 0 10*3/uL (ref 0.0–0.1)
Basophils Relative: 1 %
Eosinophils Absolute: 0.1 10*3/uL (ref 0.0–0.5)
Eosinophils Relative: 1 %
HCT: 38.1 % (ref 36.0–46.0)
Hemoglobin: 12.2 g/dL (ref 12.0–15.0)
Immature Granulocytes: 0 %
Lymphocytes Relative: 32 %
Lymphs Abs: 1.4 10*3/uL (ref 0.7–4.0)
MCH: 31 pg (ref 26.0–34.0)
MCHC: 32 g/dL (ref 30.0–36.0)
MCV: 96.7 fL (ref 80.0–100.0)
Monocytes Absolute: 0.4 10*3/uL (ref 0.1–1.0)
Monocytes Relative: 8 %
Neutro Abs: 2.5 10*3/uL (ref 1.7–7.7)
Neutrophils Relative %: 58 %
Platelets: 298 10*3/uL (ref 150–400)
RBC: 3.94 MIL/uL (ref 3.87–5.11)
RDW: 12.5 % (ref 11.5–15.5)
WBC: 4.4 10*3/uL (ref 4.0–10.5)
nRBC: 0 % (ref 0.0–0.2)

## 2021-11-28 LAB — COMPREHENSIVE METABOLIC PANEL
ALT: 9 U/L (ref 0–44)
AST: 18 U/L (ref 15–41)
Albumin: 3.6 g/dL (ref 3.5–5.0)
Alkaline Phosphatase: 51 U/L (ref 38–126)
Anion gap: 11 (ref 5–15)
BUN: 9 mg/dL (ref 8–23)
CO2: 25 mmol/L (ref 22–32)
Calcium: 9.1 mg/dL (ref 8.9–10.3)
Chloride: 105 mmol/L (ref 98–111)
Creatinine, Ser: 0.87 mg/dL (ref 0.44–1.00)
GFR, Estimated: >60 mL/min (ref >60)
Glucose, Bld: 104 mg/dL — ABNORMAL HIGH (ref 70–99)
Potassium: 3.3 mmol/L — ABNORMAL LOW (ref 3.5–5.1)
Sodium: 141 mmol/L (ref 135–145)
Total Bilirubin: 0.5 mg/dL (ref 0.3–1.2)
Total Protein: 6.2 g/dL — ABNORMAL LOW (ref 6.5–8.1)

## 2021-11-28 LAB — SARS CORONAVIRUS 2 BY RT PCR: SARS Coronavirus 2 by RT PCR: NEGATIVE

## 2021-11-28 MED ORDER — DOXYCYCLINE HYCLATE 100 MG PO CAPS: 100.0000 mg | ORAL_CAPSULE | Freq: Two times a day (BID) | ORAL | 0 refills | Status: AC

## 2021-11-28 NOTE — ED Triage Notes (Signed)
Pt c/o chills, ear aches that make her dizzy, sinus congestionx1 mo. Pt c/o HTN 154/ over 100 something this morning when she checked it at home.

## 2021-11-28 NOTE — ED Provider Notes (Signed)
[facility] EMERGENCY DEPARTMENT Provider Note   CSN: [id number] Arrival date & time: 11/28/21  7342     History  Chief Complaint  Patient presents with   URI    Kimberly [name] is a 66 y.o. female.  66 year old female presents today for evaluation of sinus congestion, ear fullness, elevated blood pressure.  Symptoms have been ongoing for about a month.  She denies chest pain, shortness of breath.  Endorses slight productive cough occasionally.  Denies fever.  She is without headache, visual change, weakness, slurring of her speech.  The history is provided by the patient. No language interpreter was used.       Home Medications Prior to Admission medications   Medication Sig Start Date End Date Taking? Authorizing Provider  alendronate (FOSAMAX) 70 MG tablet Take 70 mg by mouth once a week. 01/02/21   [provider]  amitriptyline (ELAVIL) 25 MG tablet Take 25 mg by mouth at bedtime. 04/18/21   [provider]  Cholecalciferol (D3-1000) 25 MCG (1000 UT) capsule Take 1,000 Units by mouth daily.    [provider]  FEROSUL 325 (65 Fe) MG tablet Take 325 mg by mouth daily. 03/17/21   [provider]  fluticasone (FLONASE) 50 MCG/ACT nasal spray Use 1-2 sprays per nostril once daily as needed for sinus congestion or allergy symptoms. 09/20/20   Jeralyn Bennett, MD  fluticasone (FLOVENT HFA) 110 MCG/ACT inhaler Inhale 2 puffs into the lungs 2 (two) times daily. 09/20/20   Jeralyn Bennett, MD  losartan (COZAAR) 50 MG tablet Take 50 mg by mouth daily. 05/24/21   [provider]  Multiple Vitamins-Minerals (EMERGEN-C VITAMIN C) PACK Take 1 Package by mouth daily.    [provider]  pantoprazole (PROTONIX) 40 MG tablet TAKE 1 TABLET BY MOUTH TWICE DAILY BEFORE A MEAL 06/20/21   Mansouraty, Telford Nab., MD  Simethicone (GAS-X EXTRA STRENGTH) 125 MG CAPS Take 2 capsules by mouth daily.    [provider]   vitamin B-12 (CYANOCOBALAMIN) 1000 MCG tablet Take 1,000 mcg by mouth daily.    [provider]      Allergies    Contrast media [iodinated contrast media], Banana, Gabapentin, Grape (artificial) flavor, Lamotrigine, Ibuprofen, and Penicillins    Review of Systems   Review of Systems  Constitutional:  Negative for chills and fever.  HENT:  Positive for congestion, ear pain and postnasal drip. Negative for sore throat.   Eyes:  Negative for visual disturbance.  Respiratory:  Positive for cough. Negative for shortness of breath.   Cardiovascular:  Negative for chest pain.  Neurological:  Negative for syncope and headaches.  All other systems reviewed and are negative.   Physical Exam Updated Vital Signs BP (!) 178/96 (BP Location: Right Arm)   Pulse (!) 101   Temp 98.9 F (37.2 C) (Oral)   Resp 18   Ht 5\' 4"  (1.626 m)   Wt 47.6 kg   LMP 08/21/1982   SpO2 100%   BMI 18.01 kg/m  Physical Exam Vitals and nursing note reviewed.  Constitutional:      General: She is not in acute distress.    Appearance: Normal appearance. She is not ill-appearing.  HENT:     Head: Normocephalic and atraumatic.     Right Ear: Tympanic membrane, ear canal and external ear normal.     Left Ear: Tympanic membrane, ear canal and external ear normal.     Nose: Nose normal.  Eyes:  General: No scleral icterus.    Extraocular Movements: Extraocular movements intact.     Conjunctiva/sclera: Conjunctivae normal.     Pupils: Pupils are equal, round, and reactive to light.  Cardiovascular:     Rate and Rhythm: Normal rate and regular rhythm.     Pulses: Normal pulses.  Pulmonary:     Effort: Pulmonary effort is normal. No respiratory distress.     Breath sounds: Normal breath sounds. No wheezing or rales.  Abdominal:     General: There is no distension.     Tenderness: There is no abdominal tenderness.  Musculoskeletal:        General: Normal range of motion.     Cervical back:  Normal range of motion.  Skin:    General: Skin is warm and dry.  Neurological:     General: No focal deficit present.     Mental Status: She is alert. Mental status is at baseline.     Comments: Cranial nerves III through XII intact.  Full range of motion of bilateral upper and lower extremities.  5/5 strength in bilateral upper and lower extremities.  Sensation intact in upper and lower extremities.  Without dysarthria.     ED Results / Procedures / Treatments   Labs (all labs ordered are listed, but only abnormal results are displayed) Labs Reviewed  COMPREHENSIVE METABOLIC PANEL - Abnormal; Notable for the following components:      Result Value   Potassium 3.3 (*)    Glucose, Bld 104 (*)    Total Protein 6.2 (*)    All other components within normal limits  SARS CORONAVIRUS 2 BY RT PCR  CBC WITH DIFFERENTIAL/PLATELET    EKG None  Radiology No results found.  Procedures Procedures    Medications Ordered in ED Medications - No data to display  ED Course/ Medical Decision Making/ A&P                           Medical Decision Making Amount and/or Complexity of Data Reviewed Labs: ordered.   66 year old female presents today for evaluation of sinus congestion, postnasal drip, ear fullness for about a month.  Endorses occasional dizziness episodes with position change.  Currently denies any dizziness.  Neurological exam is without any focal deficits.  Patient most likely has sinusitis.  We will treat with Augmentin.  She is afebrile and without respiratory distress.  Considered chest x-ray for pneumonia however she is without shortness of breath, afebrile.  Low suspicion for pneumonia however shared decision making had with patient who will defer this at this time and is eager for discharge. Patient seems to be eager regarding going home as she has asked multiple times even before the interview if she can be discharged.  Symptomatic management discussed.  Discussed  keeping a blood pressure diary and following up with PCP for elevated blood pressure.  Doubt hypertensive emergency.  Patient voices understanding and is in agreement with plan.   Final Clinical Impression(s) / ED Diagnoses Final diagnoses:  Acute sinusitis, recurrence not specified, unspecified location    Rx / DC Orders ED Discharge Orders          Ordered    doxycycline (VIBRAMYCIN) 100 MG capsule  2 times daily        11/28/21 1051              Evlyn Courier, [place] 11/28/21 1053    Valarie Merino, MD 11/28/21 1201

## 2021-11-28 NOTE — Discharge Instructions (Signed)
You likely have sinusitis.  I have sent antibiotics into the pharmacy for you.  I recommend you also do sinus rinses which you can get over-the-counter.  Also recommend keeping a blood pressure diary and following up with your primary care provider.  If you have any worsening symptoms including shortness of breath, chest pain please return to the emergency room.

## 2021-12-01 ENCOUNTER — Other Ambulatory Visit: Payer: Self-pay | Admitting: Gastroenterology

## 2021-12-21 ENCOUNTER — Encounter: Payer: Self-pay | Admitting: Gastroenterology

## 2022-01-21 ENCOUNTER — Emergency Department (HOSPITAL_COMMUNITY)
Admission: EM | Admit: 2022-01-21 | Discharge: 2022-01-21 | Disposition: A | Discharge status: Home / Self Care | Payer: Medicare Other | Attending: Emergency Medicine | Admitting: Emergency Medicine

## 2022-01-21 ENCOUNTER — Encounter (HOSPITAL_COMMUNITY): Payer: Self-pay | Admitting: Emergency Medicine

## 2022-01-21 ENCOUNTER — Other Ambulatory Visit: Payer: Self-pay

## 2022-01-21 DIAGNOSIS — Z79899 Other long term (current) drug therapy: Secondary | ICD-10-CM | POA: Insufficient documentation

## 2022-01-21 DIAGNOSIS — I10 Essential (primary) hypertension: Secondary | ICD-10-CM | POA: Insufficient documentation

## 2022-01-21 DIAGNOSIS — R202 Paresthesia of skin: Secondary | ICD-10-CM

## 2022-01-21 LAB — COMPREHENSIVE METABOLIC PANEL
ALT: 11 U/L (ref 0–44)
AST: 22 U/L (ref 15–41)
Albumin: 4 g/dL (ref 3.5–5.0)
Alkaline Phosphatase: 53 U/L (ref 38–126)
Anion gap: 7 (ref 5–15)
BUN: 8 mg/dL (ref 8–23)
CO2: 26 mmol/L (ref 22–32)
Calcium: 9.3 mg/dL (ref 8.9–10.3)
Chloride: 106 mmol/L (ref 98–111)
Creatinine, Ser: 0.85 mg/dL (ref 0.44–1.00)
GFR, Estimated: >60 mL/min (ref >60)
Glucose, Bld: 97 mg/dL (ref 70–99)
Potassium: 3.7 mmol/L (ref 3.5–5.1)
Sodium: 139 mmol/L (ref 135–145)
Total Bilirubin: 0.5 mg/dL (ref 0.3–1.2)
Total Protein: 6.4 g/dL — ABNORMAL LOW (ref 6.5–8.1)

## 2022-01-21 LAB — CBC WITH DIFFERENTIAL/PLATELET
Abs Immature Granulocytes: 0.01 10*3/uL (ref 0.00–0.07)
Basophils Absolute: 0.1 10*3/uL (ref 0.0–0.1)
Basophils Relative: 1 %
Eosinophils Absolute: 0.1 10*3/uL (ref 0.0–0.5)
Eosinophils Relative: 1 %
HCT: 37.8 % (ref 36.0–46.0)
Hemoglobin: 12 g/dL (ref 12.0–15.0)
Immature Granulocytes: 0 %
Lymphocytes Relative: 22 %
Lymphs Abs: 1.1 10*3/uL (ref 0.7–4.0)
MCH: 31.3 pg (ref 26.0–34.0)
MCHC: 31.7 g/dL (ref 30.0–36.0)
MCV: 98.7 fL (ref 80.0–100.0)
Monocytes Absolute: 0.4 10*3/uL (ref 0.1–1.0)
Monocytes Relative: 7 %
Neutro Abs: 3.6 10*3/uL (ref 1.7–7.7)
Neutrophils Relative %: 69 %
Platelets: 335 10*3/uL (ref 150–400)
RBC: 3.83 MIL/uL — ABNORMAL LOW (ref 3.87–5.11)
RDW: 12.1 % (ref 11.5–15.5)
WBC: 5.2 10*3/uL (ref 4.0–10.5)
nRBC: 0 % (ref 0.0–0.2)

## 2022-01-21 MED ORDER — GABAPENTIN 100 MG PO CAPS: 100.0000 mg | ORAL_CAPSULE | Freq: Three times a day (TID) | ORAL | 0 refills | Status: DC

## 2022-01-21 NOTE — ED Provider Notes (Signed)
[facility] EMERGENCY DEPARTMENT Provider Note   CSN: [id number] Arrival date & time: 01/21/22  1229     History  Chief Complaint  Patient presents with   Tingling   Hiatal Hernia    Kimberly [name] is a 66 y.o. female.  66 y.o female with a PMH of neuropathy, HTN presents to the ED with a chief complaint of tingling throughout her arms and legs for several months.  Patient does have a history of neuropathy, was previously placed on gabapentin, however she reports she stopped taking this medication to 300 mg 3 times daily were making her very unsteady on her feet.  Does report that the pain has gotten worse and she is currently not taking any medication for this.  In addition, she reports she been taking her blood pressure medication as written for twice a day, but both of the pills in the morning.  She now states that she is come to the realization that she will need to take 1 tablet in the morning and 1 tablet at night as prescribed.  Does have a history of hiatal hernia which she brings up on interview, reports she has an appointment with general surgery on the upcoming month. NO weakness, no headache, no other complaints.   The history is provided by the patient.       Home Medications Prior to Admission medications   Medication Sig Start Date End Date Taking? Authorizing Provider  gabapentin (NEURONTIN) 100 MG capsule Take 1 capsule (100 mg total) by mouth 3 (three) times daily for 14 days. 01/21/22 02/04/22 Yes Francisco Eyerly, Beverley Fiedler, PA-C  alendronate (FOSAMAX) 70 MG tablet Take 70 mg by mouth once a week. 01/02/21   [provider]  amitriptyline (ELAVIL) 25 MG tablet Take 25 mg by mouth at bedtime. 04/18/21   [provider]  Cholecalciferol (D3-1000) 25 MCG (1000 UT) capsule Take 1,000 Units by mouth daily.    [provider]  FEROSUL 325 (65 Fe) MG tablet Take 325 mg by mouth daily. 03/17/21   [provider]  fluticasone (FLONASE)  50 MCG/ACT nasal spray Use 1-2 sprays per nostril once daily as needed for sinus congestion or allergy symptoms. 09/20/20   Jeralyn Bennett, MD  fluticasone (FLOVENT HFA) 110 MCG/ACT inhaler Inhale 2 puffs into the lungs 2 (two) times daily. 09/20/20   Jeralyn Bennett, MD  losartan (COZAAR) 50 MG tablet Take 50 mg by mouth daily. 05/24/21   [provider]  Multiple Vitamins-Minerals (EMERGEN-C VITAMIN C) PACK Take 1 Package by mouth daily.    [provider]  pantoprazole (PROTONIX) 40 MG tablet TAKE 1 TABLET BY MOUTH TWICE DAILY BEFORE A MEAL 12/01/21   Mansouraty, Telford Nab., MD  Simethicone (GAS-X EXTRA STRENGTH) 125 MG CAPS Take 2 capsules by mouth daily.    [provider]  vitamin B-12 (CYANOCOBALAMIN) 1000 MCG tablet Take 1,000 mcg by mouth daily.    [provider]      Allergies    Contrast media [iodinated contrast media], Banana, Gabapentin, Grape (artificial) flavor, Lamotrigine, Ibuprofen, and Penicillins    Review of Systems   Review of Systems  Constitutional:  Negative for fever.  Respiratory:  Negative for shortness of breath.   Cardiovascular:  Negative for chest pain.  Neurological: Negative.  Negative for tremors, seizures, syncope, facial asymmetry, speech difficulty, light-headedness, numbness and headaches.    Physical Exam Updated Vital Signs BP (!) 173/89   Pulse 86   Temp 98.5 F (  36.9 C) (Oral)   Resp 18   Ht 5\' 3"  (1.6 m)   Wt 48.5 kg   LMP 08/21/1982   SpO2 99%   BMI 18.95 kg/m  Physical Exam Vitals and nursing note reviewed.  Constitutional:      Appearance: Normal appearance.  HENT:     Head: Normocephalic and atraumatic.     Mouth/Throat:     Mouth: Mucous membranes are moist.  Pulmonary:     Effort: Pulmonary effort is normal.  Abdominal:     General: Abdomen is flat.  Musculoskeletal:     Cervical back: Normal range of motion and neck supple.  Skin:    General: Skin is warm and dry.  Neurological:      Mental Status: She is alert and oriented to person, place, and time.     ED Results / Procedures / Treatments   Labs (all labs ordered are listed, but only abnormal results are displayed) Labs Reviewed  COMPREHENSIVE METABOLIC PANEL - Abnormal; Notable for the following components:      Result Value   Total Protein 6.4 (*)    All other components within normal limits  CBC WITH DIFFERENTIAL/PLATELET - Abnormal; Notable for the following components:   RBC 3.83 (*)    All other components within normal limits    EKG None  Radiology No results found.  Procedures Procedures    Medications Ordered in ED Medications - No data to display  ED Course/ Medical Decision Making/ A&P                           Medical Decision Making   Presents to the ED with a chief complaint of tingling throughout her lower and upper extremities, reports that she does have a history of neuropathy, was previously placed on gabapentin however discontinued this medication as she felt that the dosing was making her somewhat sleepy and somnolence.  On today's visit, she is also concerned about her blood pressure medication.  She reports she has been taking her losartan twice daily, 2 of the tablets in the morning before breakfast.  She feels somewhat dizzy in the mornings after doing this.  Now doing her weight in the ED for approximately 4-1/2 hours she reports she did not think that she should be taking this medication once in the morning and once at night.  Her exam is benign she is answering questions adequately no weakness, no headache, no changes in her speech to suggest acute neurological component.  Discussed with patient likely decreasing her gabapentin to 100 3 times daily, and tapering upwards if the pain does not seem to improve.  She does have an appointment to follow-up with PCP.  She also is telling me about her hiatal hernia, which is currently not causing her any pain and she has an  appointment scheduled with the surgeon to have this evaluated.  Is overall in stable condition without any acute complaints at this time.  Stable for discharge.  Portions of this note were generated with [employer]. Dictation errors may occur despite best attempts at proofreading.  Final Clinical Impression(s) / ED Diagnoses Final diagnoses:  Tingling    Rx / DC Orders ED Discharge Orders          Ordered    gabapentin (NEURONTIN) 100 MG capsule  3 times daily        01/21/22 1725  Janeece Fitting, PA-C 01/21/22 1729    Cristie Hem, MD 01/21/22 [phone]

## 2022-01-21 NOTE — Discharge Instructions (Signed)
You were prescribed medication to help with your tingling, this medication needs to be taken 1 tablet three times a day.  You were given a 14 day course, you will need to obtain a refill from your primary care physician.

## 2022-01-21 NOTE — ED Triage Notes (Addendum)
Per ems, pt from home. Pt reports generalized tingling all over that has been going on for a few days. Reports hx of neuropathy, reports feeling same as previous flare ups. Hx of htn, compliant with medications. Pt lives at home independently, ambulates with a walker. Hx of hiatal hernia, no new issues or pain with hernia.

## 2022-01-21 NOTE — ED Provider Triage Note (Signed)
Emergency Medicine Provider Triage Evaluation Note  LYNDALL BELLOT , a 66 y.o. female  was evaluated in triage.  Pt complains of burning sensation in both of her legs.  She states that this has been ongoing for days, but has had this sensation intermittently for months.  She states that she was diagnosed with neuropathy by her neurologist and placed on gabapentin.  She states that the gabapentin made her feel nervous so she stopped taking it.  She also endorses tingling to her legs but denies weakness.  She has been ambulating.  She denies new injuries or trauma to her back.  Review of Systems  Positive: See above Negative:   Physical Exam  LMP 08/21/1982   SpO2 98%  Gen:   Awake, no distress   Resp:  Normal effort  MSK:   Moves extremities without difficulty  Other:  Sensation intact, strength 5/5 in bilateral lower extremities.  Medical Decision Making  Medically screening exam initiated at 12:41 PM.  Appropriate orders placed.  KALIOPE QUINONEZ was informed that the remainder of the evaluation will be completed by another provider, this initial triage assessment does not replace that evaluation, and the importance of remaining in the ED until their evaluation is complete.     Mickie Hillier, PA-C 01/21/22 1243

## 2022-02-14 ENCOUNTER — Ambulatory Visit (INDEPENDENT_AMBULATORY_CARE_PROVIDER_SITE_OTHER): Payer: Medicare Other | Admitting: Gastroenterology

## 2022-02-14 ENCOUNTER — Encounter: Payer: Self-pay | Admitting: Gastroenterology

## 2022-02-14 VITALS — BP 134/76 | HR 104 | Ht 63.0 in | Wt 99.0 lb

## 2022-02-14 DIAGNOSIS — K5900 Constipation, unspecified: Secondary | ICD-10-CM

## 2022-02-14 DIAGNOSIS — Z862 Personal history of diseases of the blood and blood-forming organs and certain disorders involving the immune mechanism: Secondary | ICD-10-CM

## 2022-02-14 DIAGNOSIS — K449 Diaphragmatic hernia without obstruction or gangrene: Secondary | ICD-10-CM

## 2022-02-14 DIAGNOSIS — R140 Abdominal distension (gaseous): Secondary | ICD-10-CM

## 2022-02-14 DIAGNOSIS — K5909 Other constipation: Secondary | ICD-10-CM

## 2022-02-14 DIAGNOSIS — K581 Irritable bowel syndrome with constipation: Secondary | ICD-10-CM | POA: Diagnosis not present

## 2022-02-14 DIAGNOSIS — K5904 Chronic idiopathic constipation: Secondary | ICD-10-CM | POA: Diagnosis not present

## 2022-02-14 MED ORDER — LINACLOTIDE 145 MCG PO CAPS: 145.0000 ug | ORAL_CAPSULE | Freq: Every day | ORAL | 0 refills | Status: DC

## 2022-02-14 NOTE — Progress Notes (Signed)
[place] VISIT   Primary Care Provider Arthur Holms, NP [place] [address] [phone]  Patient Profile: Kimberly [name] is a 66 y.o. female with a pmh significant for multifocal lung cancer (status post SBRT), prior alcohol abuse (in cessation since 2000 08/2003), asthma, status post cholecystectomy, GERD, hypertension, hyperlipidemia, small bowel AVMs, iron deficiency anemia, cervical web, pharyngeal dysphagia, chronic constipation.  The patient presents to the [facility] for an evaluation and management of problem(s) noted below:  Problem List 1. Bloating   2. Irritable bowel syndrome with constipation   3. Chronic idiopathic constipation   4. Other constipation   5. History of iron deficiency anemia   6. Hiatal hernia     History of Present Illness Please see prior notes for full details of HPI.  Interval History Today, the patient returns for follow-up.  The patient's biggest issue that she is still experiencing is her bloating as well as persisting constipation issues.  Today she expresses that she has not had a bowel movement in nearly 5 days.  She does feel improvement in her symptoms somewhat after she has her bowel movement in regards to her bloating though it does not take everything away completely.  She is frustrated by this continued issue of bloating but hopes that it will help.  The simethicone that she takes does not help her completely.  She is not drinking 64 ounces of fluid nearly a day and thinks that may be playing some role for her.  She does not use laxative therapies on a regular basis either.  She is wondering if hiatal hernia needs to be repaired to help with her bloating symptoms.  Her discomfort is mostly in the periumbilical and lower abdomen.  She does not feel significant GERD symptoms are out of proportion at this time.  GI Review of Systems Positive as above Negative for dysphagia,  odynophagia, nausea, vomiting, hematochezia, melena  Review of Systems General: Denies fevers/chills/weight loss unintentionally Cardiovascular: Denies chest pain Pulmonary: Denies shortness of breath Gastroenterological: See HPI Genitourinary: Denies darkened urine Hematological: Denies easy bruising/bleeding Dermatological: Denies jaundice Psychological: Mood is stable   Medications Current Outpatient Medications  Medication Sig Dispense Refill   AMBULATORY NON FORMULARY MEDICATION Take 750 mg by mouth daily.     amitriptyline (ELAVIL) 25 MG tablet Take 25 mg by mouth at bedtime.     fluticasone (FLONASE) 50 MCG/ACT nasal spray Use 1-2 sprays per nostril once daily as needed for sinus congestion or allergy symptoms. 16 g 0   fluticasone (FLOVENT HFA) 110 MCG/ACT inhaler Inhale 2 puffs into the lungs 2 (two) times daily. 36 g 1   linaclotide (LINZESS) 145 MCG CAPS capsule Take 1 capsule (145 mcg total) by mouth daily before breakfast. 30 capsule 0   losartan (COZAAR) 50 MG tablet Take 50 mg by mouth daily.     pantoprazole (PROTONIX) 40 MG tablet TAKE 1 TABLET BY MOUTH TWICE DAILY BEFORE A MEAL 60 tablet 0   Simethicone (GAS-X EXTRA STRENGTH) 125 MG CAPS Take 1 capsule by mouth as needed.     No current facility-administered medications for this visit.    Allergies Allergies  Allergen Reactions   Contrast Media [Iodinated Contrast Media] Itching   Banana     Lip swelling   Gabapentin Hives   Grape (Artificial) Flavor Swelling    Allergic to grapes   Lamotrigine Hives   Ibuprofen Rash   Penicillins Rash    Did it  involve swelling of the face/tongue/throat, SOB, or low BP? No Did it involve sudden or severe rash/hives, skin peeling, or any reaction on the inside of your mouth or nose? No Did you need to seek medical attention at a hospital or doctor's office? No When did it last happen?      more than 10 years If all above answers are "NO", may proceed with cephalosporin  use.     Histories Past Medical History:  Diagnosis Date   Alcohol abuse, in remission    since around 0932   Alcoholic hepatitis    fatty liver on imaging- Treated Hepatits C and treated   Allergic rhinitis    Anemia    EGD with duodenal AVM, normal colonoscopy 04/2012   Asthma    Callus of foot 2009   Cancer [facility])    Chest pain, musculoskeletal 2011   Cholelithiasis    [place] 09/2008: Cholelithiasis is present, s/p cholecystectomy   Depression    Excessive cerumen in ear canal    since before 2000   GERD (gastroesophageal reflux disease)    Hiatal hernia    History of blood transfusion    Hyperlipidemia    Hypertension    Menopausal syndrome    Treated with estradiol in the past - discontinued 04/2012   MENOPAUSAL SYNDROME 09/24/2006   2008 Note : The pt is adament about having this medication.  She fully understands the increased risk of heart disease and cancer that is associated with HRT and is willing to accept this risk in order to prevent the debilitating hot flashes she has off this medication.  Will continue to encourage her to try to wean herself off of this medication at our next visit.  2009 note indicated that she tr   Otitis externa, acute 06/2010   bilateral, treated with azithromycin after failure of neomycin drops   Otitis media, acute 06/2010   PONV (postoperative nausea and vomiting)    Past Surgical History:  Procedure Laterality Date   ABDOMINAL HYSTERECTOMY  1980s   BIOPSY  03/18/2019   Procedure: BIOPSY;  Surgeon: Irving Copas., MD;  Location: [place];  Service: Gastroenterology;;   BRONCHIAL BIOPSY  05/03/2020   Procedure: BRONCHIAL BIOPSIES;  Surgeon: Collene Gobble, MD;  Location: [facility] ENDOSCOPY;  Service: Pulmonary;;   BRONCHIAL BRUSHINGS  05/03/2020   Procedure: BRONCHIAL BRUSHINGS;  Surgeon: Collene Gobble, MD;  [facility] ENDOSCOPY;  Service: Pulmonary;;   BRONCHIAL NEEDLE ASPIRATION BIOPSY  05/03/2020   Procedure: BRONCHIAL  NEEDLE ASPIRATION BIOPSIES;  Surgeon: Collene Gobble, MD;  Location: [facility] ENDOSCOPY;  Service: Pulmonary;;   CHOLECYSTECTOMY  01/06/09   COLONOSCOPY  05/09/2012   Procedure: COLONOSCOPY;  Surgeon: Inda Castle, MD;  Location: [name] ENDOSCOPY;  Service: Endoscopy;  Laterality: N/A;   COLONOSCOPY WITH PROPOFOL N/A 03/18/2019   Procedure: COLONOSCOPY WITH PROPOFOL;  Surgeon: Rush Landmark Telford Nab., MD;  Location: [place];  Service: Gastroenterology;  Laterality: N/A;   ENTEROSCOPY N/A 03/18/2019   Procedure: ENTEROSCOPY;  Surgeon: Rush Landmark Telford Nab., MD;  Location: [place];  Service: Gastroenterology;  Laterality: N/A;   ESOPHAGOGASTRODUODENOSCOPY  05/09/2012   Procedure: ESOPHAGOGASTRODUODENOSCOPY (EGD);  Surgeon: Inda Castle, MD;  Location: [place];  Service: Endoscopy;  Laterality: N/A;   FIDUCIAL MARKER PLACEMENT  05/03/2020   Procedure: FIDUCIAL MARKER PLACEMENT;  Surgeon: Collene Gobble, MD;  Location: [facility] ENDOSCOPY;  Service: Pulmonary;;   HEMOSTASIS CLIP PLACEMENT  03/18/2019   Procedure: HEMOSTASIS CLIP PLACEMENT;  Surgeon: Rush Landmark,  Telford Nab., MD;  Location: [facility] ENDOSCOPY;  Service: Gastroenterology;;   HOT HEMOSTASIS N/A 03/18/2019   Procedure: HOT HEMOSTASIS (ARGON PLASMA COAGULATION/BICAP);  Surgeon: Irving Copas., MD;  Location: [place];  Service: Gastroenterology;  Laterality: N/A;   POLYPECTOMY  03/18/2019   Procedure: POLYPECTOMY;  Surgeon: Rush Landmark Telford Nab., MD;  Location: [place];  Service: Gastroenterology;;   SUBMUCOSAL TATTOO INJECTION  03/18/2019   Procedure: SUBMUCOSAL TATTOO INJECTION;  Surgeon: Irving Copas., MD;  Location: [place];  Service: Gastroenterology;;   VIDEO BRONCHOSCOPY WITH ENDOBRONCHIAL NAVIGATION N/A 05/03/2020   Procedure: VIDEO BRONCHOSCOPY WITH ENDOBRONCHIAL NAVIGATION;  Surgeon: Collene Gobble, MD;  Location: [place] ENDOSCOPY;  Service: Pulmonary;  Laterality: N/A;   Social History    Socioeconomic History   Marital status: Single    Spouse name: Not on file   Number of children: 2   Years of education: 55   Highest education level: Not on file  Occupational History   Not on file  Tobacco Use   Smoking status: Former    Types: Cigarettes    Quit date: 08/21/1995    Years since quitting: 26.5   Smokeless tobacco: Never   Tobacco comments:    Patient states she stopped smoking in 31 yrs ago (as of 2022)  Vaping Use   Vaping Use: Never used  Substance and Sexual Activity   Alcohol use: No    Comment: in remission since 2007   Drug use: Yes    Types: Marijuana    Comment: occ   Sexual activity: Not on file  Other Topics Concern   Not on file  Social History Narrative   Right handed   Caffeine use: none   Lives alone   Social Determinants of Health   Financial Resource Strain: Not on file  Food Insecurity: Not on file  Transportation Needs: Not on file  Physical Activity: Not on file  Stress: Not on file  Social Connections: Not on file  Intimate Partner Violence: Not on file   Family History  Problem Relation Age of Onset   Hypertension Mother    Colon cancer Neg Hx    Esophageal cancer Neg Hx    Inflammatory bowel disease Neg Hx    Liver disease Neg Hx    Pancreatic cancer Neg Hx    Rectal cancer Neg Hx    Stomach cancer Neg Hx    I have reviewed her medical, social, and family history in detail and updated the electronic medical record as necessary.    PHYSICAL EXAMINATION  BP 134/76   Pulse (!) 104   Ht 5\' 3"  (1.6 m)   Wt 99 lb (44.9 kg)   LMP 08/21/1982   BMI 17.54 kg/m  Wt Readings from Last 3 Encounters:  02/14/22 99 lb (44.9 kg)  01/21/22 107 lb (48.5 kg)  11/28/21 104 lb 15 oz (47.6 kg)  GEN: NAD, appears stated age, doesn't appear chronically ill PSYCH: Cooperative, without pressured speech EYE: Conjunctivae pink, sclerae anicteric ENT: MMM CV: Nontachycardic RESP: No audible wheezing GI: NABS, soft, NT/ND, without  rebound MSK/EXT: No lower extremity edema SKIN: No jaundice NEURO:  Alert & Oriented x 3, no focal deficits   REVIEW OF DATA  I reviewed the following data at the time of this encounter:  GI Procedures and Studies  No new studies to review  Laboratory Studies  Reviewed those in epic  Imaging Studies  No new studies to review   ASSESSMENT  Ms. Harner is  a 65 y.o. female with a pmh significant for multifocal lung cancer (status post SBRT), prior alcohol abuse (in cessation since 2000 08/2003), asthma, status post cholecystectomy, GERD, hypertension, hyperlipidemia, small bowel AVMs, iron deficiency anemia, cervical web, pharyngeal dysphagia, chronic constipation.  The patient is seen today for evaluation and management of:  1. Bloating   2. Irritable bowel syndrome with constipation   3. Chronic idiopathic constipation   4. Other constipation   5. History of iron deficiency anemia   6. Hiatal hernia    The patient is hemodynamically stable at this time.  Clinically, her bloating symptoms continue to be significant issue for her and although they are not impacting her ability to perform ADLs, they are bothersome and a nuisance for her.  Hearing her describe her symptoms once again, it does seem like her bloating issues are probably tied into her chronic constipation issues.  She may have underlying IBS-C versus CIC and so I think it is ideal for [place] to try to optimize her bowel habits in an effort of seeing if that could be more effective for her symptoms rather than just continue her Gas-X treatment.  Exocrine pancreas insufficiency is unlikely based on normal fecal elastase testing.  SIBO breath testing in can still be considered in the near future depending on how we do in regards to optimizing her bowel habits.  We will plan a bowel regimen to try to optimize her as well as give her samples of Linzess to see if that may be effective for her if necessary.  She will update [place] in a couple of  weeks to let us know how things are going and how her symptoms are doing.  She has a history of iron deficiency and I think iron supplementation may also be playing some role with her and so we are going to stop her iron and have her repeat blood work in 6 to 8 weeks to see where things stand and whether she needs to continue the oral iron supplementation.  All patient questions were answered to the best of my ability, and the patient agrees to the aforementioned plan of action with follow-up as indicated.   PLAN  Bowel regimen - MiraLAX once to twice daily - Dulcolax 10 mg every other day - FiberCon to be shaded daily - If no bowel movement after initiating this, then she will trial Linzess 145 mcg samples - If patient feels improvement in symptoms then she may continue Linzess and we will send in a prescription or can adjust the Linzess dosing up or down depending on her management of symptoms Toileting techniques discussed with patient Intake 64 to 80 ounces of fluid per day Consider SIBO breath testing in future May continue Gas-X 2 to 4 pills daily Hold oral iron at this time Repeat iron/TIBC/ferritin/CBC in 6 to 8 weeks off oral iron to see if she redevelops any iron deficiency in the setting of her previously known AVMs   Orders Placed This Encounter  Procedures   CBC   Iron   Iron Binding Cap (TIBC)   Ferritin    New Prescriptions   LINACLOTIDE (LINZESS) 145 MCG CAPS CAPSULE    Take 1 capsule (145 mcg total) by mouth daily before breakfast.   Modified Medications   No medications on file    Planned Follow Up No follow-ups on file.   Total Time in Face-to-Face and in Coordination of Care for patient including independent/personal interpretation/review of prior testing, medical history, examination,  medication adjustment, communicating results with the patient directly, and documentation within the EHR is 25 minutes.   Justice Britain, MD [place]  Gastroenterology Advanced Endoscopy Office # [phone]

## 2022-02-14 NOTE — Patient Instructions (Addendum)
If you are age 68 or older, your body mass index should be between 23-30. Your Body mass index is 17.54 kg/m. If this is out of the aforementioned range listed, please consider follow up with your Primary Care Provider. ________________________________________________________  The [facility] providers would like to encourage you to use [facility] to communicate with providers for non-urgent requests or questions.  Due to long hold times on the telephone, sending your provider a message by [facility] may be a faster and more efficient way to get a response.  Please allow 48 business hours for a response.  Please remember that this is for non-urgent requests.  _______________________________________________________  DISCONTINUE: Iron  Your provider has requested that you go to the basement level for lab work in 6 - 8 weeks (04-02-22).  Press "B" on the elevator. The lab is located at the first door on the left as you exit the elevator.  Please purchase the following medications over the counter and take as directed:  START: Miralax 1 capful daily.  On the 2nd day after starting Miralax, you will start Dulcolax 10mg  every other day.  On the 3rd day after starting Miralax, you will start Linzess 112mcg.  Call us with any questions or concerns.  We have given you samples of Linzess 161mcg.  Take 1 capsule before breakfast meal each day.  You will need a follow up appointment in 3 to 4 months(December 2023).  We will contact you to schedule this appointment.  Toileting tips to help with your constipation - Drink at least 64-80 ounces of water/liquid per day. - Establish a time to try to move your bowels every day.  For many people, this is after a cup of coffee or after a meal such as breakfast. - Sit all of the way back on the toilet keeping your back fairly straight and while sitting up, try to rest the tops of your forearms on your upper thighs.   - Raising your feet with a step stool/squatty potty can  be helpful to improve the angle that allows your stool to pass through the rectum. - Relax the rectum feeling it bulge toward the toilet water.  If you feel your rectum raising toward your body, you are contracting rather than relaxing. - Breathe in and slowly exhale. "Belly breath" by expanding your belly towards your belly button. Keep belly expanded as you gently direct pressure down and back to the anus.  A low pitched GRRR sound can assist with increasing intra-abdominal pressure.  - Repeat 3-4 times. If unsuccessful, contract the pelvic floor to restore normal tone and get off the toilet.  Avoid excessive straining. - To reduce excessive wiping by teaching your anus to normally contract, place hands on outer aspect of knees and resist knee movement outward.  Hold 5-10 second then place hands just inside of knees and resist inward movement of knees.  Hold 5 seconds.  Repeat a few times each way.   Thank you for entrusting me with your care and choosing [facility].  Dr Rush Landmark

## 2022-02-16 ENCOUNTER — Encounter: Payer: Self-pay | Admitting: Gastroenterology

## 2022-02-16 DIAGNOSIS — K581 Irritable bowel syndrome with constipation: Secondary | ICD-10-CM | POA: Insufficient documentation

## 2022-02-16 DIAGNOSIS — K5904 Chronic idiopathic constipation: Secondary | ICD-10-CM | POA: Insufficient documentation

## 2022-02-16 DIAGNOSIS — K449 Diaphragmatic hernia without obstruction or gangrene: Secondary | ICD-10-CM | POA: Insufficient documentation

## 2022-02-16 DIAGNOSIS — Z862 Personal history of diseases of the blood and blood-forming organs and certain disorders involving the immune mechanism: Secondary | ICD-10-CM | POA: Insufficient documentation

## 2022-02-23 ENCOUNTER — Telehealth: Payer: Self-pay | Admitting: Gastroenterology

## 2022-02-23 MED ORDER — LINACLOTIDE 145 MCG PO CAPS: 145.0000 ug | ORAL_CAPSULE | Freq: Every day | ORAL | 0 refills | Status: DC

## 2022-02-23 NOTE — Telephone Encounter (Signed)
Inbound call from patient stating that she was given samples of Linzess and the medication it helping. Patient is requesting a prescription be sent to the pharmacy. Please advise.

## 2022-02-23 NOTE — Telephone Encounter (Signed)
I spoke to [facility] and confirmed the pharmacy she wanted her Linzess sent to. [place]. Sent in 90 day supply per patient request.

## 2022-03-15 ENCOUNTER — Telehealth: Payer: Self-pay | Admitting: Gastroenterology

## 2022-03-15 NOTE — Telephone Encounter (Signed)
She may also double her Linzess dosing for a few days to see if that is effective. Thanks. GM

## 2022-03-15 NOTE — Telephone Encounter (Signed)
Patient also informed that she may increase Linzess to 290 mcg once daily. Pt voiced understanding and will let us know if recommended regimen does not help.

## 2022-03-15 NOTE — Telephone Encounter (Signed)
The pt states she continues to have bloating issues.  She is taking linzess 145 mcg daily, 2 caps miralax daily, and dulcolax daily.  She has not started FiberCon and stopped Gas Ex.  She will start [place] and restart Gas ex. She will call back if this does not help.

## 2022-03-15 NOTE — Telephone Encounter (Signed)
Inbound call from patient stating she is bloated from taking linzess, please advise.

## 2022-04-02 ENCOUNTER — Other Ambulatory Visit (INDEPENDENT_AMBULATORY_CARE_PROVIDER_SITE_OTHER): Payer: Medicare Other

## 2022-04-02 DIAGNOSIS — K449 Diaphragmatic hernia without obstruction or gangrene: Secondary | ICD-10-CM

## 2022-04-02 DIAGNOSIS — K5909 Other constipation: Secondary | ICD-10-CM

## 2022-04-02 DIAGNOSIS — Z862 Personal history of diseases of the blood and blood-forming organs and certain disorders involving the immune mechanism: Secondary | ICD-10-CM | POA: Diagnosis not present

## 2022-04-02 DIAGNOSIS — R140 Abdominal distension (gaseous): Secondary | ICD-10-CM

## 2022-04-02 LAB — FERRITIN: Ferritin: 33.1 ng/mL (ref 10.0–291.0)

## 2022-04-02 LAB — IRON: Iron: 58 ug/dL (ref 42–145)

## 2022-04-02 LAB — CBC
HCT: 38.3 % (ref 36.0–46.0)
Hemoglobin: 12.4 g/dL (ref 12.0–15.0)
MCHC: 32.4 g/dL (ref 30.0–36.0)
MCV: 92.9 fl (ref 78.0–100.0)
Platelets: 317 10*3/uL (ref 150.0–400.0)
RBC: 4.12 Mil/uL (ref 3.87–5.11)
RDW: 12.8 % (ref 11.5–15.5)
WBC: 4.8 10*3/uL (ref 4.0–10.5)

## 2022-04-03 ENCOUNTER — Telehealth: Payer: Self-pay | Admitting: Gastroenterology

## 2022-04-03 LAB — IRON AND TIBC
Iron Saturation: 18 % (ref 15–55)
Iron: 57 ug/dL (ref 27–139)
Total Iron Binding Capacity: 321 ug/dL (ref 250–450)
UIBC: 264 ug/dL (ref 118–369)

## 2022-04-03 NOTE — Telephone Encounter (Signed)
Patient is returning your call for lab results. 

## 2022-04-04 NOTE — Telephone Encounter (Signed)
Patient has been informed of lab results. Pt is not currently taking any iron supplements. Follow-up scheduled for patient for 1/24.

## 2022-04-05 NOTE — Telephone Encounter (Signed)
Thanks. GM 

## 2022-05-02 ENCOUNTER — Other Ambulatory Visit: Payer: Self-pay | Admitting: Gastroenterology

## 2022-05-29 ENCOUNTER — Ambulatory Visit (INDEPENDENT_AMBULATORY_CARE_PROVIDER_SITE_OTHER): Payer: Medicare Other | Admitting: Gastroenterology

## 2022-05-29 ENCOUNTER — Encounter: Payer: Self-pay | Admitting: Gastroenterology

## 2022-05-29 VITALS — BP 132/86 | HR 93 | Ht 63.0 in | Wt 104.0 lb

## 2022-05-29 DIAGNOSIS — R140 Abdominal distension (gaseous): Secondary | ICD-10-CM

## 2022-05-29 DIAGNOSIS — K5904 Chronic idiopathic constipation: Secondary | ICD-10-CM | POA: Diagnosis not present

## 2022-05-29 NOTE — Progress Notes (Signed)
[facility] VISIT   Primary Care Provider Arthur Holms, NP [place] [address] [phone]  Patient Profile: Kimberly [name] is a 67 y.o. female with a pmh significant for multifocal lung cancer (status post SBRT), prior alcohol abuse (in cessation since 2000 08/2003), asthma, status post cholecystectomy, GERD, hypertension, hyperlipidemia, small bowel AVMs, iron deficiency anemia, cervical web, pharyngeal dysphagia, chronic constipation.  The patient presents to the [facility] for an evaluation and management of problem(s) noted below:  Problem List 1. Chronic idiopathic constipation   2. Bloating     History of Present Illness Please see prior notes for full details of HPI.  Interval History The patient returns for follow-up.  She is still experiencing issues of bloating and constipation.  Initially Linzess seemed to have helped but it is no longer helping even with uptitrated 290 mcg daily dosing.  Patient is currently taking MiraLAX once to twice daily.  When she has a bowel movement she feels better.  In regards to her abdominal discomfort and bloating.  She is not having any blood in her stools.  GI Review of Systems Positive as above Negative for dysphagia, odynophagia, nausea, vomiting, hematochezia, melena  Review of Systems General: Denies fevers/chills/weight loss unintentionally Cardiovascular: Denies chest pain Pulmonary: Denies shortness of breath Gastroenterological: See HPI Genitourinary: Denies darkened urine Hematological: Denies easy bruising/bleeding Dermatological: Denies jaundice Psychological: Mood is stable   Medications Current Outpatient Medications  Medication Sig Dispense Refill   AMBULATORY NON FORMULARY MEDICATION Take 750 mg by mouth daily.     amitriptyline (ELAVIL) 25 MG tablet Take 25 mg by mouth at bedtime.     fluticasone (FLONASE) 50 MCG/ACT nasal spray Use 1-2 sprays per  nostril once daily as needed for sinus congestion or allergy symptoms. 16 g 0   fluticasone (FLOVENT HFA) 110 MCG/ACT inhaler Inhale 2 puffs into the lungs 2 (two) times daily. 36 g 1   losartan (COZAAR) 50 MG tablet Take 50 mg by mouth daily.     NON FORMULARY Strontium caps     LINZESS 145 MCG CAPS capsule TAKE 1 CAPSULE BY MOUTH ONCE DAILY BEFORE BREAKFAST. (Patient not taking: Reported on 05/29/2022) 90 capsule 0   pantoprazole (PROTONIX) 40 MG tablet TAKE 1 TABLET BY MOUTH TWICE DAILY BEFORE A MEAL (Patient not taking: Reported on 05/29/2022) 60 tablet 0   Simethicone (GAS-X EXTRA STRENGTH) 125 MG CAPS Take 1 capsule by mouth as needed. (Patient not taking: Reported on 05/29/2022)     No current facility-administered medications for this visit.    Allergies Allergies  Allergen Reactions   Contrast Media [Iodinated Contrast Media] Itching   Banana     Lip swelling   Gabapentin Hives   Grape (Artificial) Flavor Swelling    Allergic to grapes   Lamotrigine Hives   Ibuprofen Rash   Penicillins Rash    Did it involve swelling of the face/tongue/throat, SOB, or low BP? No Did it involve sudden or severe rash/hives, skin peeling, or any reaction on the inside of your mouth or nose? No Did you need to seek medical attention at a hospital or doctor's office? No When did it last happen?      more than 10 years If all above answers are "NO", may proceed with cephalosporin use.     Histories Past Medical History:  Diagnosis Date   Alcohol abuse, in remission    since around 8250   Alcoholic hepatitis    fatty liver  on imaging- Treated Hepatits C and treated   Allergic rhinitis    Anemia    EGD with duodenal AVM, normal colonoscopy 04/2012   Asthma    Callus of foot 2009   Cancer [facility])    Chest pain, musculoskeletal 2011   Cholelithiasis    [place] 09/2008: Cholelithiasis is present, s/p cholecystectomy   Depression    Excessive cerumen in ear canal    since before 2000   GERD  (gastroesophageal reflux disease)    Hiatal hernia    History of blood transfusion    Hyperlipidemia    Hypertension    Menopausal syndrome    Treated with estradiol in the past - discontinued 04/2012   MENOPAUSAL SYNDROME 09/24/2006   2008 Note : The pt is adament about having this medication.  She fully understands the increased risk of heart disease and cancer that is associated with HRT and is willing to accept this risk in order to prevent the debilitating hot flashes she has off this medication.  Will continue to encourage her to try to wean herself off of this medication at our next visit.  2009 note indicated that she tr   Otitis externa, acute 06/2010   bilateral, treated with azithromycin after failure of neomycin drops   Otitis media, acute 06/2010   PONV (postoperative nausea and vomiting)    Past Surgical History:  Procedure Laterality Date   ABDOMINAL HYSTERECTOMY  1980s   BIOPSY  03/18/2019   Procedure: BIOPSY;  Surgeon: Irving Copas., MD;  Location: [facility];  Service: Gastroenterology;;   BRONCHIAL BIOPSY  05/03/2020   Procedure: BRONCHIAL BIOPSIES;  Surgeon: Collene Gobble, MD;  Location: [facility] ENDOSCOPY;  Service: Pulmonary;;   BRONCHIAL BRUSHINGS  05/03/2020   Procedure: BRONCHIAL BRUSHINGS;  Surgeon: Collene Gobble, MD;  Location: [facility] ENDOSCOPY;  Service: Pulmonary;;   BRONCHIAL NEEDLE ASPIRATION BIOPSY  05/03/2020   Procedure: BRONCHIAL NEEDLE ASPIRATION BIOPSIES;  Surgeon: Collene Gobble, MD;  Location: [facility] ENDOSCOPY;  Service: Pulmonary;;   CHOLECYSTECTOMY  01/06/09   COLONOSCOPY  05/09/2012   Procedure: COLONOSCOPY;  Surgeon: Inda Castle, MD;  Location: [facility] ENDOSCOPY;  Service: Endoscopy;  Laterality: N/A;   COLONOSCOPY WITH PROPOFOL N/A 03/18/2019   Procedure: COLONOSCOPY WITH PROPOFOL;  Surgeon: Rush Landmark Telford Nab., MD;  Location: [facility];  Service: Gastroenterology;  Laterality: N/A;   ENTEROSCOPY N/A 03/18/2019   Procedure: ENTEROSCOPY;   Surgeon: Rush Landmark Telford Nab., MD;  Location: [place];  Service: Gastroenterology;  Laterality: N/A;   ESOPHAGOGASTRODUODENOSCOPY  05/09/2012   Procedure: ESOPHAGOGASTRODUODENOSCOPY (EGD);  Surgeon: Inda Castle, MD;  Location: [place];  Service: Endoscopy;  Laterality: N/A;   FIDUCIAL MARKER PLACEMENT  05/03/2020   Procedure: FIDUCIAL MARKER PLACEMENT;  Surgeon: Collene Gobble, MD;  Location: [facility] ENDOSCOPY;  Service: Pulmonary;;   HEMOSTASIS CLIP PLACEMENT  03/18/2019   Procedure: HEMOSTASIS CLIP PLACEMENT;  Surgeon: Irving Copas., MD;  Location: [place];  Service: Gastroenterology;;   HOT HEMOSTASIS N/A 03/18/2019   Procedure: HOT HEMOSTASIS (ARGON PLASMA COAGULATION/BICAP);  Surgeon: Irving Copas., MD;  Location: [place];  Service: Gastroenterology;  Laterality: N/A;   POLYPECTOMY  03/18/2019   Procedure: POLYPECTOMY;  Surgeon: Rush Landmark Telford Nab., MD;  Location: [place];  Service: Gastroenterology;;   SUBMUCOSAL TATTOO INJECTION  03/18/2019   Procedure: SUBMUCOSAL TATTOO INJECTION;  Surgeon: Irving Copas., MD;  Location: [place];  Service: Gastroenterology;;   VIDEO BRONCHOSCOPY WITH ENDOBRONCHIAL NAVIGATION N/A 05/03/2020   Procedure: VIDEO BRONCHOSCOPY  WITH ENDOBRONCHIAL NAVIGATION;  Surgeon: Collene Gobble, MD;  Location: [facility] ENDOSCOPY;  Service: Pulmonary;  Laterality: N/A;   Social History   Socioeconomic History   Marital status: Single    Spouse name: Not on file   Number of children: 2   Years of education: 59   Highest education level: Not on file  Occupational History   Not on file  Tobacco Use   Smoking status: Former    Types: Cigarettes    Quit date: 08/21/1995    Years since quitting: 26.7   Smokeless tobacco: Never   Tobacco comments:    Patient states she stopped smoking in 31 yrs ago (as of 2022)  Vaping Use   Vaping Use: Never used  Substance and Sexual Activity   Alcohol use: No    Comment:  in remission since 2007   Drug use: Yes    Types: Marijuana    Comment: occ   Sexual activity: Not on file  Other Topics Concern   Not on file  Social History Narrative   Right handed   Caffeine use: none   Lives alone   Social Determinants of Health   Financial Resource Strain: Not on file  Food Insecurity: Not on file  Transportation Needs: Not on file  Physical Activity: Not on file  Stress: Not on file  Social Connections: Not on file  Intimate Partner Violence: Not on file   Family History  Problem Relation Age of Onset   Hypertension Mother    Colon cancer Neg Hx    Esophageal cancer Neg Hx    Inflammatory bowel disease Neg Hx    Liver disease Neg Hx    Pancreatic cancer Neg Hx    Rectal cancer Neg Hx    Stomach cancer Neg Hx    I have reviewed her medical, social, and family history in detail and updated the electronic medical record as necessary.    PHYSICAL EXAMINATION  BP 132/86   Pulse 93   Ht 5\' 3"  (1.6 m)   Wt 104 lb (47.2 kg)   LMP 08/21/1982   BMI 18.42 kg/m  Wt Readings from Last 3 Encounters:  05/29/22 104 lb (47.2 kg)  02/14/22 99 lb (44.9 kg)  01/21/22 107 lb (48.5 kg)  GEN: NAD, appears stated age, doesn't appear chronically ill PSYCH: Cooperative, without pressured speech EYE: Conjunctivae pink, sclerae anicteric ENT: MMM CV: Nontachycardic RESP: No audible wheezing GI: NABS, soft, NT/ND, without rebound MSK/EXT: No lower extremity edema SKIN: No jaundice NEURO:  Alert & Oriented x 3, no focal deficits   REVIEW OF DATA  I reviewed the following data at the time of this encounter:  GI Procedures and Studies  No new studies to review  Laboratory Studies  Reviewed those in epic  Imaging Studies  No new studies to review   ASSESSMENT  Kimberly [name] is a 67 y.o. female with a pmh significant for multifocal lung cancer (status post SBRT), prior alcohol abuse (in cessation since 2000 08/2003), asthma, status post cholecystectomy,  GERD, hypertension, hyperlipidemia, small bowel AVMs, iron deficiency anemia, cervical web, pharyngeal dysphagia, chronic constipation.  The patient is seen today for evaluation and management of:  1. Chronic idiopathic constipation   2. Bloating    The patient is hemodynamically stable.  Her chronic constipation continues to be an issue for her and it is the reason that she has more significant bloating most likely.  We had considered SIBO breath testing in the future but  when she moves her bowels more regularly she has less bloating.  She will continue on her current MiraLAX for now since that is helping her move her bowels relatively regularly but we will give her some other medications as samples in an effort of trying to help the patient further.  Should she do well with the sample medications then we can work on prescribing that further.  Holding on SIBO breath testing for now.  All patient questions were answered to the best of my ability, and the patient agrees to the aforementioned plan of action with follow-up as indicated.   PLAN  Bowel regimen - MiraLAX once to twice daily - Dulcolax 10 mg every other day - FiberCon or any fiber daily - If no bowel movement after 3 days then initiate IBSrela - If patient feels improvement in symptoms then she may continue the medication and we will send in a prescription - If patient has no improvement then can consider Trulance or Amitiza Toileting techniques discussed with patient again and placed in patient instructions again Intake 64 to 80 ounces of fluid per day Consider SIBO breath testing in future May continue Gas-X 2 to 4 pills daily   No orders of the defined types were placed in this encounter.   New Prescriptions   No medications on file   Modified Medications   No medications on file    Planned Follow Up Return in about 4 months (around 09/27/2022).   Total Time in Face-to-Face and in Coordination of Care for patient  including independent/personal interpretation/review of prior testing, medical history, examination, medication adjustment, communicating results with the patient directly, and documentation within the EHR is 25 minutes.   Justice Britain, MD [facility] Office # [phone]

## 2022-05-29 NOTE — Patient Instructions (Signed)
Toileting tips to help with your constipation - Drink at least 64-80 ounces of water/liquid per day. - Establish a time to try to move your bowels every day.  For many people, this is after a cup of coffee or after a meal such as breakfast. - Sit all of the way back on the toilet keeping your back fairly straight and while sitting up, try to rest the tops of your forearms on your upper thighs.   - Raising your feet with a step stool/squatty potty can be helpful to improve the angle that allows your stool to pass through the rectum. - Relax the rectum feeling it bulge toward the toilet water.  If you feel your rectum raising toward your body, you are contracting rather than relaxing. - Breathe in and slowly exhale. "Belly breath" by expanding your belly towards your belly button. Keep belly expanded as you gently direct pressure down and back to the anus.  A low pitched GRRR sound can assist with increasing intra-abdominal pressure.  - Repeat 3-4 times. If unsuccessful, contract the pelvic floor to restore normal tone and get off the toilet.  Avoid excessive straining. - To reduce excessive wiping by teaching your anus to normally contract, place hands on outer aspect of knees and resist knee movement outward.  Hold 5-10 second then place hands just inside of knees and resist inward movement of knees.  Hold 5 seconds.  Repeat a few times each way.  Medication Samples have been provided to the patient.  Drug name: Tiney Rouge       Strength: 50 mg         Qty:2 boxes  LOT: [id number]  Exp.Date: 07/25/2024  Dosing instructions: Take 1 tablet by mouth once daily.   Send my chart message in 2-3 weeks after trying Ibserla.   You may also take Miralax 1 capful twice daily , plus Dulcolax 10 mg -1 pill by mouth every other day.   Follow up in 4 months. Office to contact for appointment at a later time.    Thank you for choosing me and [facility].  Dr. Rush Landmark

## 2022-06-01 ENCOUNTER — Encounter: Payer: Self-pay | Admitting: Gastroenterology

## 2022-06-05 ENCOUNTER — Telehealth: Payer: Self-pay | Admitting: Gastroenterology

## 2022-06-05 NOTE — Telephone Encounter (Signed)
Sorry to hear this. If we have samples of other medications we could try. GM

## 2022-06-05 NOTE — Telephone Encounter (Signed)
Inbound call from patient requesting a call back to discuss if she can get a different medication. Patient stated that Losartan does not agree with her. Please advise.

## 2022-06-05 NOTE — Telephone Encounter (Signed)
Patient states that she took Ibserla 50 mg - once daily  for 6 days and it caused diarrhea. Patient states that she tried every other day and continued to have diarrhea. Patient states that other medications such as Trulance or Amitiza were mentioned if Ibserla did not work. Please advise.

## 2022-06-11 NOTE — Telephone Encounter (Signed)
Patient will try Trulance. Samples placed at front desk for patient to pick up. Patient will let us know if Trulance works for her, at that time we will send rx to pharmacy.   Medication Samples have been provided to the patient.  Drug name: Trulance        Strength: 3 mg        Qty: 9 tablets  LOT: [id number]  Exp.Date: 06/2023  Dosing instructions: Take 1 tablet by mouth once daily.

## 2022-06-18 NOTE — Telephone Encounter (Signed)
Patient called states the Trulance medication is going right through her ad not working. Seeking further advise.

## 2022-06-19 NOTE — Telephone Encounter (Signed)
Rovonda, I am not sure I understand what "going right through her" means. Is it working too well? Is not working at all? Please get some more information and then we can decide next steps. Thanks. GM

## 2022-06-20 NOTE — Telephone Encounter (Signed)
Left message

## 2022-06-25 NOTE — Telephone Encounter (Signed)
Left message

## 2022-07-04 NOTE — Telephone Encounter (Signed)
Left message

## 2022-07-05 IMAGING — DX DG NECK SOFT TISSUE
2 series · 2 of 2 positions shown · non-contrast
Comparison: None

CLINICAL DATA: Neck and chest pain for 2 days following upper
endoscopy

EXAM:
NECK SOFT TISSUES - 1+ VIEW

[neck lat]
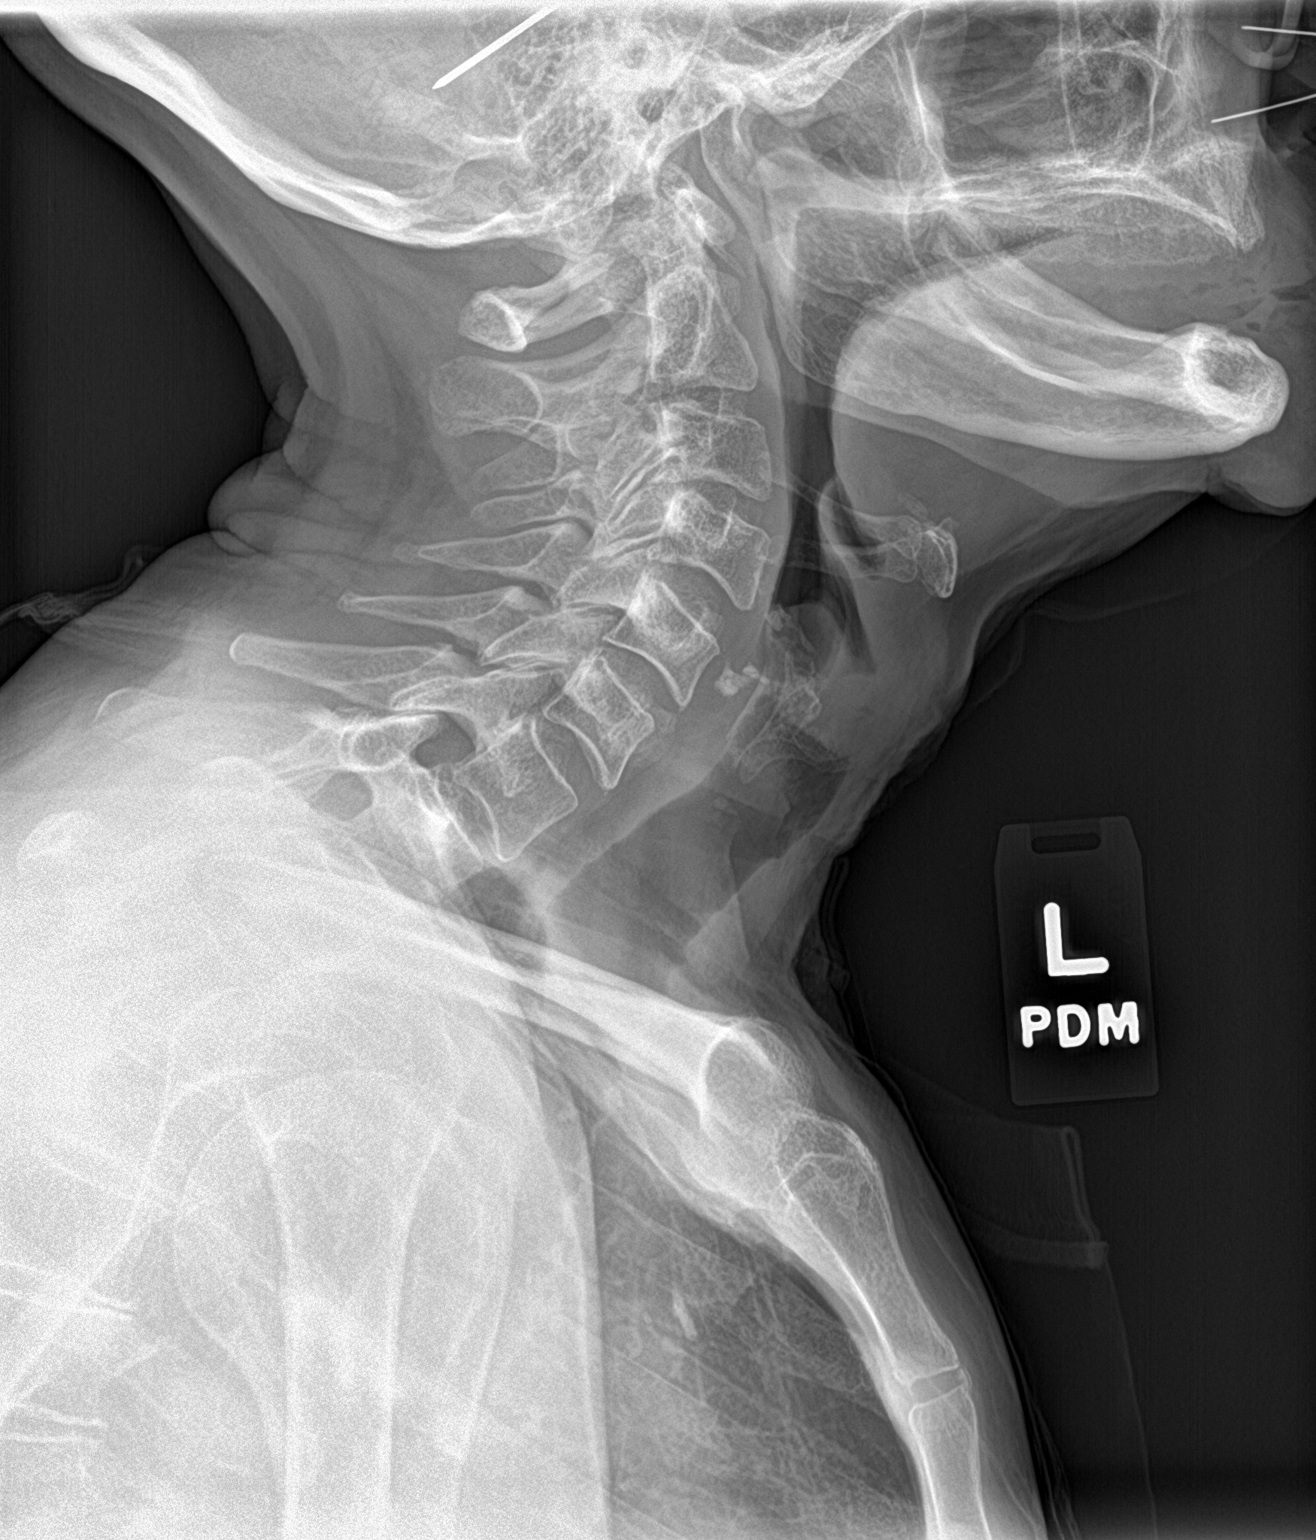

[neck ap]
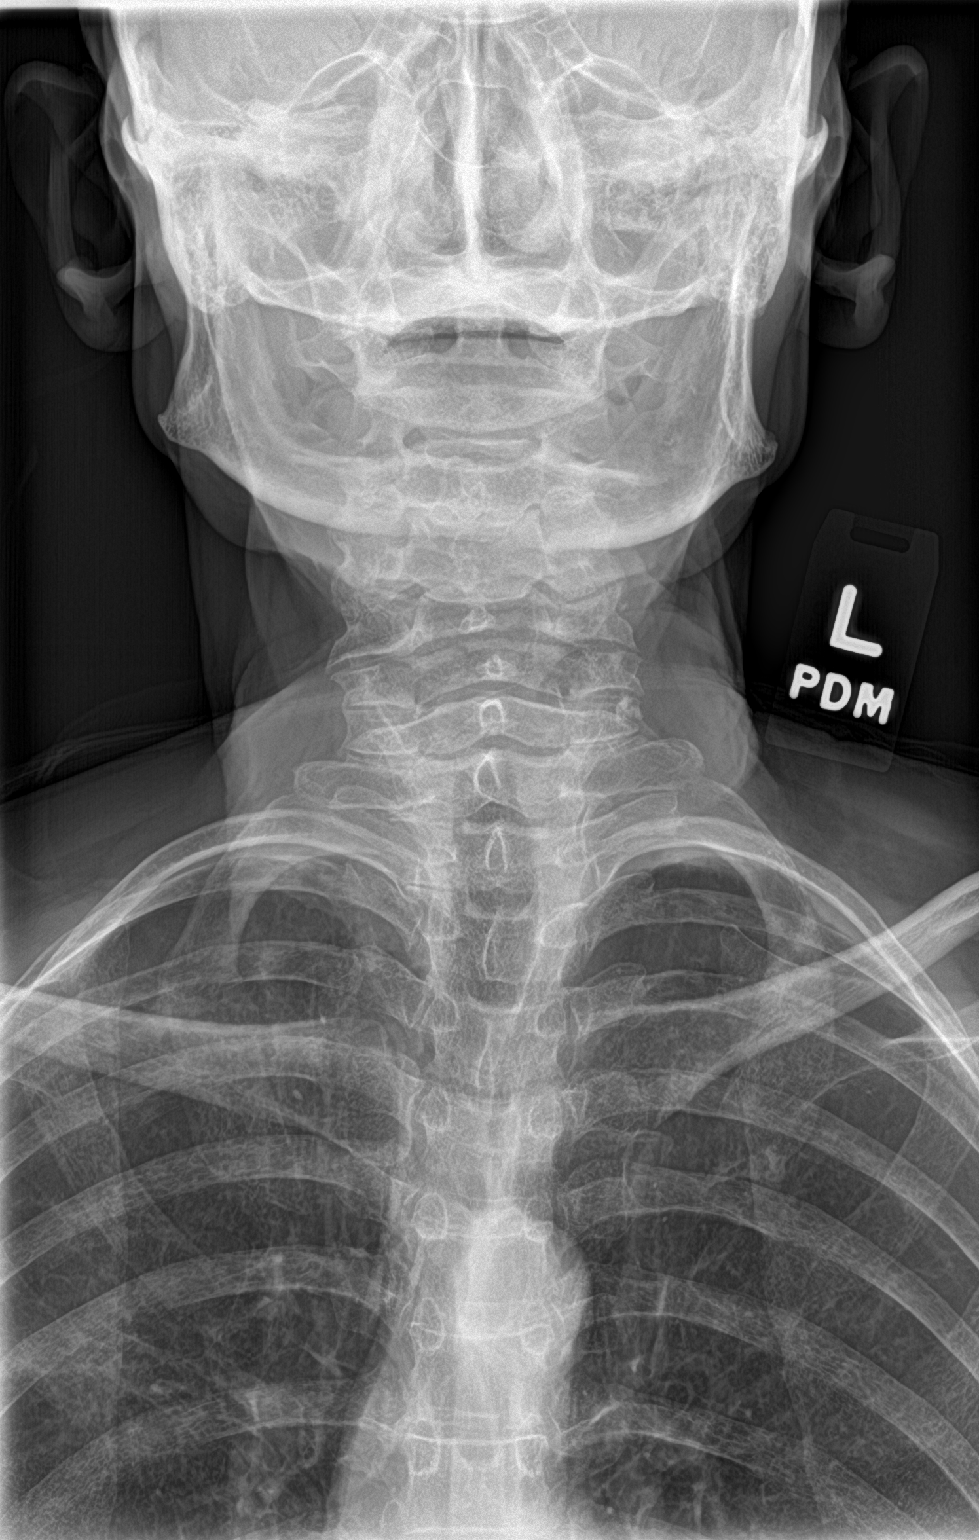

[2 of 2 positions shown; findings below may reference images not displayed]

FINDINGS: Prevertebral soft tissues normal thickness.

Epiglottis and aryepiglottic folds normal appearance.

Airway appears patent.

No soft tissue abnormalities identified.

Mild facet degenerative changes.

Minimal retrolisthesis L3-L4.
IMPRESSION: No cervical soft tissue abnormalities identified.

## 2022-07-05 IMAGING — DX DG CHEST 2V
2 series · 2 of 2 positions shown · non-contrast
Comparison: 12/14/2020

Correlation: CT chest 08/09/2020

CLINICAL DATA: Throat pain in upper chest pain for 2 days, upper
endoscopy 2 days ago

EXAM:
CHEST - 2 VIEW

[chest pa]
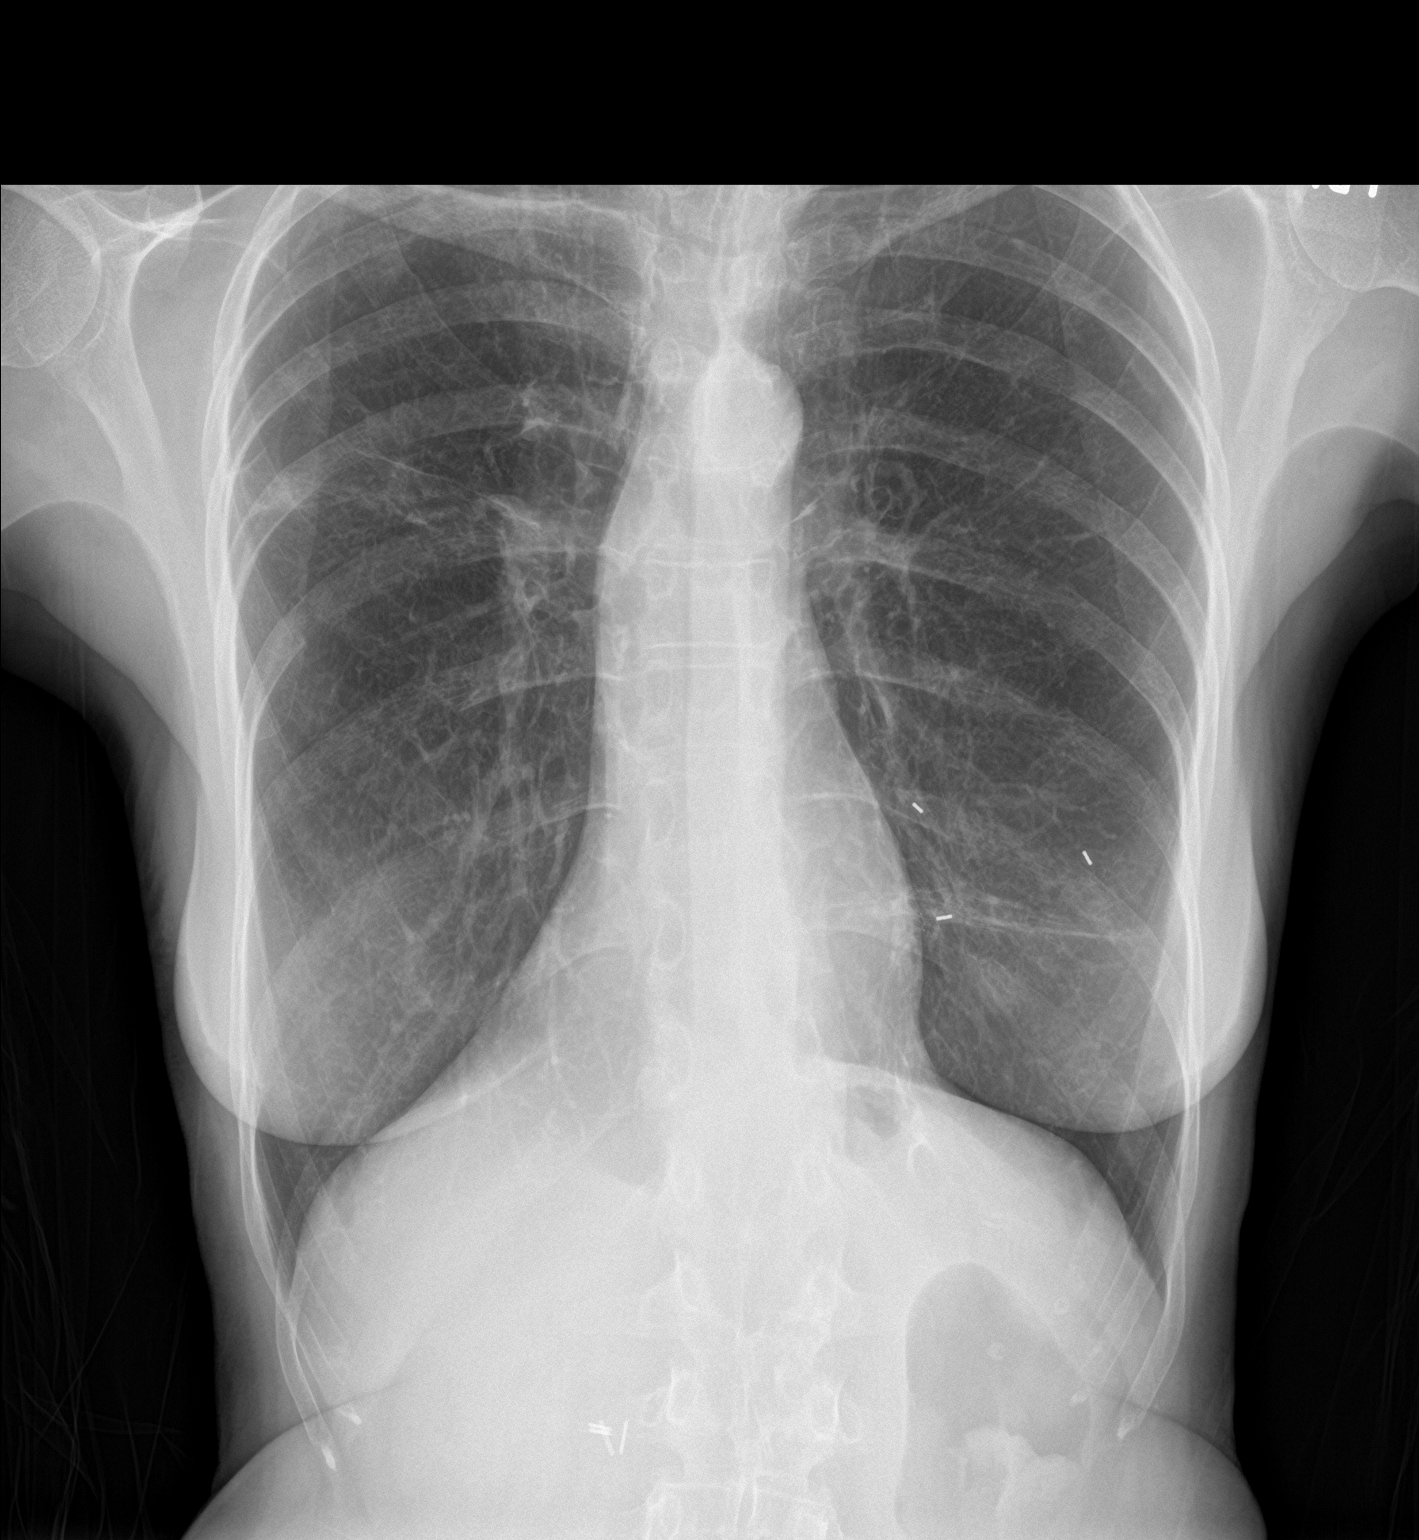

[chest lat]
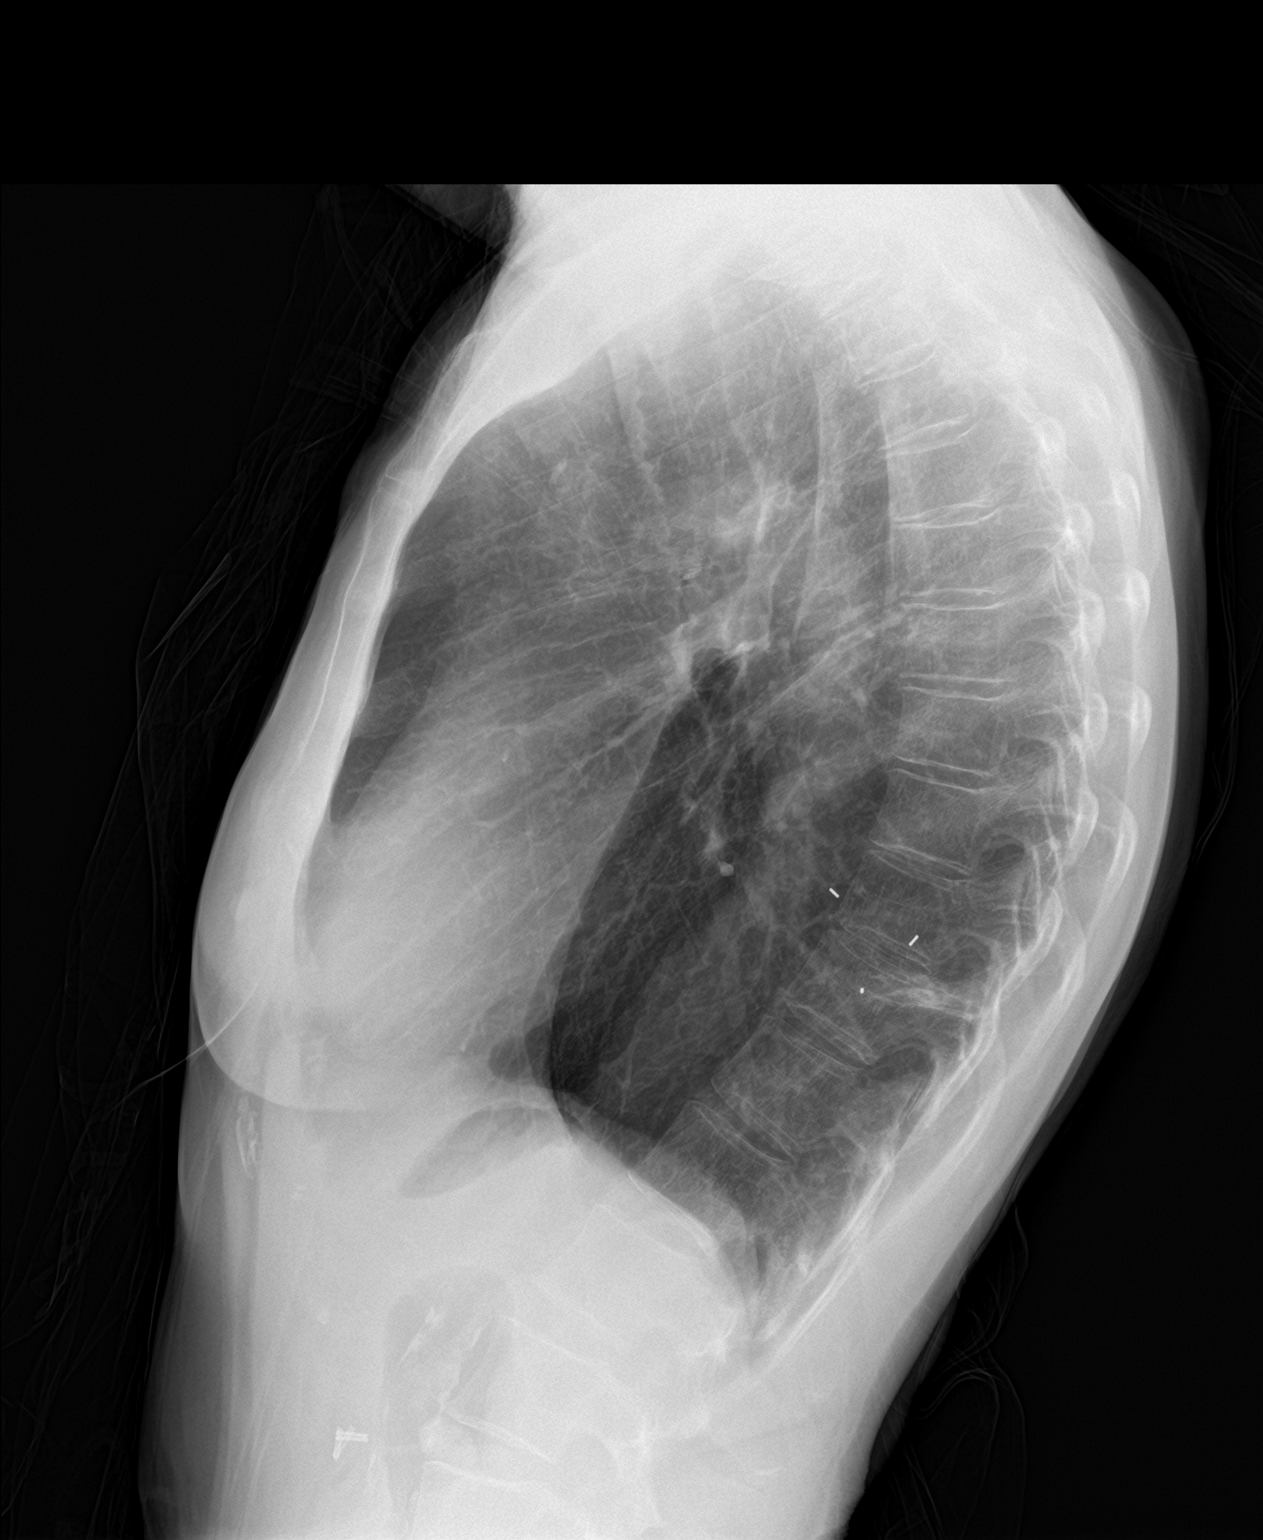

[2 of 2 positions shown; findings below may reference images not displayed]

FINDINGS: Normal heart size, mediastinal contours, and pulmonary vascularity.
Atherosclerotic calcification aorta.

Emphysematous and bronchitic changes consistent with COPD.

Fiduciary markers in LEFT lower lobe.

Linear subsegmental atelectasis versus scarring in RIGHT upper lobe.

Two questionable areas of nodularity identified in RIGHT upper lobe,
question pulmonary nodules; these were not well seen on the prior
chest radiograph but are seen on an earlier CT of 08/09/2020.

Lungs clear without infiltrate, pleural effusion, or pneumothorax.

Bones appear demineralized.
IMPRESSION: COPD changes with additional markers in the LEFT lower lobe.

Nodular foci RIGHT lung, seen on an earlier CT exam of 08/09/2020.

Aortic Atherosclerosis (E3056-78P.P) and Emphysema (E3056-EBW.V).

## 2022-07-24 ENCOUNTER — Encounter: Payer: Self-pay | Admitting: Gastroenterology

## 2022-08-16 ENCOUNTER — Other Ambulatory Visit: Payer: Self-pay | Admitting: Registered Nurse

## 2022-08-16 DIAGNOSIS — Z1231 Encounter for screening mammogram for malignant neoplasm of breast: Secondary | ICD-10-CM

## 2022-08-21 ENCOUNTER — Telehealth: Payer: Self-pay | Admitting: Medical Oncology

## 2022-08-21 NOTE — Telephone Encounter (Signed)
"  Im doing good. I don't smoke , I smoke a little weed . What do I need to do? " I told her to quit smoking anything. " Oh , I can do that . Ill do that".

## 2022-10-03 ENCOUNTER — Ambulatory Visit
Admission: RE | Admit: 2022-10-03 | Discharge: 2022-10-03 | Disposition: A | Discharge status: Home / Self Care | Payer: 59 | Source: Ambulatory Visit | Attending: Registered Nurse | Admitting: Registered Nurse

## 2022-10-03 DIAGNOSIS — Z1231 Encounter for screening mammogram for malignant neoplasm of breast: Secondary | ICD-10-CM

## 2022-10-30 ENCOUNTER — Ambulatory Visit (INDEPENDENT_AMBULATORY_CARE_PROVIDER_SITE_OTHER): Payer: 59 | Admitting: Gastroenterology

## 2022-10-30 ENCOUNTER — Encounter: Payer: Self-pay | Admitting: Gastroenterology

## 2022-10-30 VITALS — BP 126/64 | HR 71 | Ht 63.0 in | Wt 101.0 lb

## 2022-10-30 DIAGNOSIS — R140 Abdominal distension (gaseous): Secondary | ICD-10-CM

## 2022-10-30 DIAGNOSIS — K5904 Chronic idiopathic constipation: Secondary | ICD-10-CM

## 2022-10-30 NOTE — Patient Instructions (Addendum)
Toileting tips to help with your constipation - Drink at least 64-80 ounces of water/liquid per day. - Establish a time to try to move your bowels every day.  For many people, this is after a cup of coffee or after a meal such as breakfast. - Sit all of the way back on the toilet keeping your back fairly straight and while sitting up, try to rest the tops of your forearms on your upper thighs.   - Raising your feet with a step stool/squatty potty can be helpful to improve the angle that allows your stool to pass through the rectum. - Relax the rectum feeling it bulge toward the toilet water.  If you feel your rectum raising toward your body, you are contracting rather than relaxing. - Breathe in and slowly exhale. "Belly breath" by expanding your belly towards your belly button. Keep belly expanded as you gently direct pressure down and back to the anus.  A low pitched GRRR sound can assist with increasing intra-abdominal pressure.  - Repeat 3-4 times. If unsuccessful, contract the pelvic floor to restore normal tone and get off the toilet.  Avoid excessive straining. - To reduce excessive wiping by teaching your anus to normally contract, place hands on outer aspect of knees and resist knee movement outward.  Hold 5-10 second then place hands just inside of knees and resist inward movement of knees.  Hold 5 seconds.  Repeat a few times each way.  Trial of Linzess 72 mcg - Take one pill once daily.    You may increase Miralax to twice daily.   Folllow up in 6 months. -Office to call and schedule at a later time.   _______________________________________________________  If your blood pressure at your visit was 140/90 or greater, please contact your primary care physician to follow up on this.  _______________________________________________________  If you are age 46 or older, your body mass index should be between 23-30. Your Body mass index is 17.89 kg/m. If this is out of the aforementioned  range listed, please consider follow up with your Primary Care Provider.  If you are age 87 or younger, your body mass index should be between 19-25. Your Body mass index is 17.89 kg/m. If this is out of the aformentioned range listed, please consider follow up with your Primary Care Provider.   ________________________________________________________  The [facility] providers would like to encourage you to use [facility] to communicate with providers for non-urgent requests or questions.  Due to long hold times on the telephone, sending your provider a message by [facility] may be a faster and more efficient way to get a response.  Please allow 48 business hours for a response.  Please remember that this is for non-urgent requests.  _______________________________________________________  Thank you for choosing me and [facility].  Dr. Meridee Score

## 2022-10-30 NOTE — Progress Notes (Signed)
GASTROENTEROLOGY OUTPATIENT CLINIC VISIT   Primary Care Provider Loura Back, NP [address] [place] [address] [phone]  Patient Profile: Kimberly [name] is a 67 y.o. female with a pmh significant for multifocal lung cancer (status post SBRT), prior alcohol abuse, asthma, status post cholecystectomy, GERD, hypertension, hyperlipidemia, small bowel AVMs, iron deficiency anemia, cervical web, pharyngeal dysphagia, chronic constipation.  The patient presents to the [facility] for an evaluation and management of problem(s) noted below:  Problem List 1. Chronic idiopathic constipation   2. Bloating      History of Present Illness Please see prior notes for full details of HPI.  Interval History The patient returns today for follow-up.  The patient was able to use both IBSrela and Trulance and felt that they were too effective for her.  Patient has been doing MiraLAX on a daily basis.  Doing so she has a bowel movement every second or third day.  She is having less bloating.  She did notice that when she was moving her bowels more frequently however, she did have less bloating overall but she could not tolerate the amount of diarrheal movements that was occurring.  The patient denies any rectal bleeding.  She is not straining at this point.  GI Review of Systems Positive as above Negative for dysphagia, odynophagia, nausea, vomiting, hematochezia, melena  Review of Systems General: Denies fevers/chills/weight loss unintentionally Cardiovascular: Denies chest pain Pulmonary: Denies shortness of breath Gastroenterological: See HPI Genitourinary: Denies darkened urine Hematological: Denies easy bruising/bleeding Dermatological: Denies jaundice Psychological: Mood is stable   Medications Current Outpatient Medications  Medication Sig Dispense Refill   AMBULATORY NON FORMULARY MEDICATION Take 750 mg by mouth daily.     amitriptyline (ELAVIL) 25 MG  tablet Take 25 mg by mouth at bedtime.     fluticasone (FLONASE) 50 MCG/ACT nasal spray Use 1-2 sprays per nostril once daily as needed for sinus congestion or allergy symptoms. 16 g 0   fluticasone (FLOVENT HFA) 110 MCG/ACT inhaler Inhale 2 puffs into the lungs 2 (two) times daily. 36 g 1   losartan (COZAAR) 50 MG tablet Take 50 mg by mouth daily.     NON FORMULARY Strontium caps     pantoprazole (PROTONIX) 40 MG tablet TAKE 1 TABLET BY MOUTH TWICE DAILY BEFORE A MEAL 60 tablet 0   polyethylene glycol (MIRALAX / GLYCOLAX) 17 g packet Take 17 g by mouth daily.     No current facility-administered medications for this visit.    Allergies Allergies  Allergen Reactions   Contrast Media [Iodinated Contrast Media] Itching   Banana     Lip swelling   Gabapentin Hives   Grape (Artificial) Flavor Swelling    Allergic to grapes   Lamotrigine Hives   Ibuprofen Rash   Penicillins Rash    Did it involve swelling of the face/tongue/throat, SOB, or low BP? No Did it involve sudden or severe rash/hives, skin peeling, or any reaction on the inside of your mouth or nose? No Did you need to seek medical attention at a hospital or doctor's office? No When did it last happen?      more than 10 years If all above answers are "NO", may proceed with cephalosporin use.     Histories Past Medical History:  Diagnosis Date   Alcohol abuse, in remission    since around 2005   Alcoholic hepatitis    fatty liver on imaging- Treated Hepatits C and treated   Allergic rhinitis  Anemia    EGD with duodenal AVM, normal colonoscopy 04/2012   Asthma    Callus of foot 2009   Cancer [facility])    Chest pain, musculoskeletal 2011   Cholelithiasis    [place] 09/2008: Cholelithiasis is present, s/p cholecystectomy   Depression    Excessive cerumen in ear canal    since before 2000   GERD (gastroesophageal reflux disease)    Hiatal hernia    History of blood transfusion    Hyperlipidemia    Hypertension     Menopausal syndrome    Treated with estradiol in the past - discontinued 04/2012   MENOPAUSAL SYNDROME 09/24/2006   2008 Note : The pt is adament about having this medication.  She fully understands the increased risk of heart disease and cancer that is associated with HRT and is willing to accept this risk in order to prevent the debilitating hot flashes she has off this medication.  Will continue to encourage her to try to wean herself off of this medication at our next visit.  2009 note indicated that she tr   Otitis externa, acute 06/2010   bilateral, treated with azithromycin after failure of neomycin drops   Otitis media, acute 06/2010   PONV (postoperative nausea and vomiting)    Past Surgical History:  Procedure Laterality Date   ABDOMINAL HYSTERECTOMY  1980s   BIOPSY  03/18/2019   Procedure: BIOPSY;  Surgeon: Lemar Lofty., MD;  Location: [facility] ENDOSCOPY;  Service: Gastroenterology;;   BRONCHIAL BIOPSY  05/03/2020   Procedure: BRONCHIAL BIOPSIES;  Surgeon: Leslye Peer, MD;  Location: [facility] ENDOSCOPY;  Service: Pulmonary;;   BRONCHIAL BRUSHINGS  05/03/2020   Procedure: BRONCHIAL BRUSHINGS;  Surgeon: Leslye Peer, MD;  Location: [facility] ENDOSCOPY;  Service: Pulmonary;;   BRONCHIAL NEEDLE ASPIRATION BIOPSY  05/03/2020   Procedure: BRONCHIAL NEEDLE ASPIRATION BIOPSIES;  Surgeon: Leslye Peer, MD;  Location: [facility] ENDOSCOPY;  Service: Pulmonary;;   CHOLECYSTECTOMY  01/06/09   COLONOSCOPY  05/09/2012   Procedure: COLONOSCOPY;  Surgeon: Louis Meckel, MD;  Location: [facility] ENDOSCOPY;  Service: Endoscopy;  Laterality: N/A;   COLONOSCOPY WITH PROPOFOL N/A 03/18/2019   Procedure: COLONOSCOPY WITH PROPOFOL;  Surgeon: Meridee Score Netty Starring., MD;  Location: [facility] ENDOSCOPY;  Service: Gastroenterology;  Laterality: N/A;   ENTEROSCOPY N/A 03/18/2019   Procedure: ENTEROSCOPY;  Surgeon: Meridee Score Netty Starring., MD;  Location: [facility] ENDOSCOPY;  Service: Gastroenterology;  Laterality: N/A;    ESOPHAGOGASTRODUODENOSCOPY  05/09/2012   Procedure: ESOPHAGOGASTRODUODENOSCOPY (EGD);  Surgeon: Louis Meckel, MD;  Location: [facility] ENDOSCOPY;  Service: Endoscopy;  Laterality: N/A;   FIDUCIAL MARKER PLACEMENT  05/03/2020   Procedure: FIDUCIAL MARKER PLACEMENT;  Surgeon: Leslye Peer, MD;  Location: [facility] ENDOSCOPY;  Service: Pulmonary;;   HEMOSTASIS CLIP PLACEMENT  03/18/2019   Procedure: HEMOSTASIS CLIP PLACEMENT;  Surgeon: Lemar Lofty., MD;  Location: [facility] ENDOSCOPY;  Service: Gastroenterology;;   HOT HEMOSTASIS N/A 03/18/2019   Procedure: HOT HEMOSTASIS (ARGON PLASMA COAGULATION/BICAP);  Surgeon: Lemar Lofty., MD;  Location: [facility] ENDOSCOPY;  Service: Gastroenterology;  Laterality: N/A;   POLYPECTOMY  03/18/2019   Procedure: POLYPECTOMY;  Surgeon: Meridee Score Netty Starring., MD;  Location: [facility] ENDOSCOPY;  Service: Gastroenterology;;   SUBMUCOSAL TATTOO INJECTION  03/18/2019   Procedure: SUBMUCOSAL TATTOO INJECTION;  Surgeon: Lemar Lofty., MD;  Location: [facility] ENDOSCOPY;  Service: Gastroenterology;;   VIDEO BRONCHOSCOPY WITH ENDOBRONCHIAL NAVIGATION N/A 05/03/2020   Procedure: VIDEO BRONCHOSCOPY WITH ENDOBRONCHIAL NAVIGATION;  Surgeon: Leslye Peer, MD;  Location: [facility] ENDOSCOPY;  Service: Pulmonary;  Laterality: N/A;   Social History   Socioeconomic History   Marital status: Single    Spouse name: Not on file   Number of children: 2   Years of education: 21   Highest education level: Not on file  Occupational History   Not on file  Tobacco Use   Smoking status: Former    Types: Cigarettes    Quit date: 08/21/1995    Years since quitting: 27.2   Smokeless tobacco: Never   Tobacco comments:    Patient states she stopped smoking in 31 yrs ago (as of 2022)  Vaping Use   Vaping Use: Never used  Substance and Sexual Activity   Alcohol use: No    Comment: in remission since 2007   Drug use: Yes    Types: Marijuana    Comment: occ   Sexual activity: Not on file   Other Topics Concern   Not on file  Social History Narrative   Right handed   Caffeine use: none   Lives alone   Social Determinants of Health   Financial Resource Strain: Not on file  Food Insecurity: Not on file  Transportation Needs: Not on file  Physical Activity: Not on file  Stress: Not on file  Social Connections: Not on file  Intimate Partner Violence: Not on file   Family History  Problem Relation Age of Onset   Hypertension Mother    Colon cancer Neg Hx    Esophageal cancer Neg Hx    Inflammatory bowel disease Neg Hx    Liver disease Neg Hx    Pancreatic cancer Neg Hx    Rectal cancer Neg Hx    Stomach cancer Neg Hx    I have reviewed her medical, social, and family history in detail and updated the electronic medical record as necessary.    PHYSICAL EXAMINATION  BP 126/64   Pulse 71   Ht 5\' 3"  (1.6 m)   Wt 101 lb (45.8 kg)   LMP 08/21/1982   BMI 17.89 kg/m  Wt Readings from Last 3 Encounters:  10/30/22 101 lb (45.8 kg)  05/29/22 104 lb (47.2 kg)  02/14/22 99 lb (44.9 kg)  GEN: NAD, appears stated age, doesn't appear chronically ill PSYCH: Cooperative, without pressured speech EYE: Conjunctivae pink, sclerae anicteric ENT: MMM CV: Nontachycardic RESP: No audible wheezing GI: NABS, soft, NT/ND, without rebound MSK/EXT: No lower extremity edema SKIN: No jaundice NEURO:  Alert & Oriented x 3, no focal deficits   REVIEW OF DATA  I reviewed the following data at the time of this encounter:  GI Procedures and Studies  No new studies to review  Laboratory Studies  Reviewed those in epic  Imaging Studies  No new studies to review   ASSESSMENT  Ms. Leibfried is a 67 y.o. female with a pmh significant for multifocal lung cancer (status post SBRT), prior alcohol abuse, asthma, status post cholecystectomy, GERD, hypertension, hyperlipidemia, small bowel AVMs, iron deficiency anemia, cervical web, pharyngeal dysphagia, chronic constipation.  The  patient is seen today for evaluation and management of:  1. Chronic idiopathic constipation   2. Bloating    The patient is clinically and hemodynamically stable at this time.  At this point, the patient has had 2 effective dosing from both IBSrela and Trulance.  I will trial her 1 more time on Linzess 72 mcg to see if that makes any difference for her.  If it does not then we are just going to maintain  her on MiraLAX twice daily.  She can use Dulcolax as needed if necessary.  I am holding on SIBO breath testing, because again it does look like as she moves her bowels more frequently, she has less bloating so I believe this is more IBS-C.  Will see how the patient is doing in 6 months, she will call and update [place] based on how the Linzess 72 mcg dosing is working or if it is not.   PLAN  Bowel regimen - Increase MiraLAX twice daily - Dulcolax 10 mg every other day - Fiber daily - 1 last trial of Linzess 72 mcg daily (do not use MiraLAX at same time) - If patient feels Linzess is helpful, then we will formally prescribe Toileting techniques discussed with patient again and placed in patient instructions again She is going to work on getting a squatty potty Intake 64 to 80 ounces of fluid per day May continue Gas-X 2-4 pills daily as needed Holding on SIBO breath testing    No orders of the defined types were placed in this encounter.   New Prescriptions   No medications on file   Modified Medications   No medications on file    Planned Follow Up No follow-ups on file.   Total Time in Face-to-Face and in Coordination of Care for patient including independent/personal interpretation/review of prior testing, medical history, examination, medication adjustment, communicating results with the patient directly, and documentation within the EHR is 25 minutes.   Corliss Parish, MD [facility] Office # [phone]

## 2022-11-06 ENCOUNTER — Telehealth: Payer: Self-pay | Admitting: Gastroenterology

## 2022-11-06 MED ORDER — LINACLOTIDE 72 MCG PO CAPS: 72.0000 ug | ORAL_CAPSULE | Freq: Every day | ORAL | 2 refills | Status: DC

## 2022-11-06 NOTE — Telephone Encounter (Signed)
Prescription for Linzess 72 mcg once daily sent to [facility]. PA maybe required, if so request will be sent to our PA team. Patient has been informed that prescription has been sent to pharmacy. Patient also made aware that rx may require PA. Patient voiced understanding.

## 2022-11-06 NOTE — Telephone Encounter (Signed)
Inbound call from patient requesting a prescription for Linzess be sent to [facility] pharmacy in [facility]. States she is willing to try medication for awhile. Requesting a call back after prescription is sent. Please advise, thank you.

## 2023-02-03 ENCOUNTER — Other Ambulatory Visit: Payer: Self-pay | Admitting: Gastroenterology

## 2023-03-05 ENCOUNTER — Other Ambulatory Visit: Payer: Self-pay | Admitting: Gastroenterology

## 2023-08-12 ENCOUNTER — Other Ambulatory Visit: Payer: Self-pay | Admitting: Registered Nurse

## 2023-08-12 DIAGNOSIS — Z1231 Encounter for screening mammogram for malignant neoplasm of breast: Secondary | ICD-10-CM

## 2023-10-04 ENCOUNTER — Ambulatory Visit

## 2023-10-04 ENCOUNTER — Ambulatory Visit
Admission: RE | Admit: 2023-10-04 | Discharge: 2023-10-04 | Disposition: A | Discharge status: Home / Self Care | Source: Ambulatory Visit | Attending: Registered Nurse | Admitting: Registered Nurse

## 2023-10-04 DIAGNOSIS — Z1231 Encounter for screening mammogram for malignant neoplasm of breast: Secondary | ICD-10-CM
